# Patient Record
Sex: Male | Born: 1945 | Race: White | Hispanic: No | Marital: Married | Smoking: Never smoker
Health system: Southern US, Community
[De-identification: ages and names within clinical notes are randomized; demographics above are authoritative.]

## PROBLEM LIST (undated history)

## (undated) DIAGNOSIS — I2510 Atherosclerotic heart disease of native coronary artery without angina pectoris: Secondary | ICD-10-CM

## (undated) DIAGNOSIS — E785 Hyperlipidemia, unspecified: Secondary | ICD-10-CM

## (undated) DIAGNOSIS — I10 Essential (primary) hypertension: Secondary | ICD-10-CM

## (undated) DIAGNOSIS — I4892 Unspecified atrial flutter: Secondary | ICD-10-CM

## (undated) DIAGNOSIS — E119 Type 2 diabetes mellitus without complications: Secondary | ICD-10-CM

---

## 1995-09-25 HISTORY — PX: CORONARY ARTERY BYPASS GRAFT: SHX141

## 1998-06-29 ENCOUNTER — Encounter: Payer: Self-pay | Admitting: Orthopedic Surgery

## 1998-06-29 ENCOUNTER — Ambulatory Visit (HOSPITAL_COMMUNITY): Admission: RE | Admit: 1998-06-29 | Discharge: 1998-06-29 | Payer: Self-pay | Admitting: Orthopedic Surgery

## 1998-07-28 ENCOUNTER — Ambulatory Visit (HOSPITAL_COMMUNITY): Admission: RE | Admit: 1998-07-28 | Discharge: 1998-07-28 | Payer: Self-pay | Admitting: Neurosurgery

## 1998-07-28 ENCOUNTER — Encounter: Payer: Self-pay | Admitting: Neurosurgery

## 1998-08-09 ENCOUNTER — Encounter: Payer: Self-pay | Admitting: Neurosurgery

## 1998-08-11 ENCOUNTER — Inpatient Hospital Stay (HOSPITAL_COMMUNITY): Admission: RE | Admit: 1998-08-11 | Discharge: 1998-08-13 | Payer: Self-pay | Admitting: Neurosurgery

## 1998-08-11 ENCOUNTER — Encounter: Payer: Self-pay | Admitting: Neurosurgery

## 2000-01-30 HISTORY — PX: OTHER SURGICAL HISTORY: SHX169

## 2000-09-24 HISTORY — PX: SPINAL CORD STIMULATOR IMPLANT: SHX2422

## 2003-07-29 ENCOUNTER — Ambulatory Visit (HOSPITAL_COMMUNITY): Admission: RE | Admit: 2003-07-29 | Discharge: 2003-07-30 | Payer: Self-pay | Admitting: Cardiovascular Disease

## 2008-08-25 ENCOUNTER — Inpatient Hospital Stay (HOSPITAL_COMMUNITY): Admission: AD | Admit: 2008-08-25 | Discharge: 2008-08-27 | Payer: Self-pay | Admitting: Cardiology

## 2008-09-24 ENCOUNTER — Encounter (HOSPITAL_COMMUNITY): Admission: RE | Admit: 2008-09-24 | Discharge: 2008-12-23 | Payer: Self-pay | Admitting: Cardiovascular Disease

## 2008-12-24 ENCOUNTER — Encounter (HOSPITAL_COMMUNITY): Admission: RE | Admit: 2008-12-24 | Discharge: 2009-03-24 | Payer: Self-pay | Admitting: Cardiovascular Disease

## 2009-03-25 ENCOUNTER — Encounter (HOSPITAL_COMMUNITY): Admission: RE | Admit: 2009-03-25 | Discharge: 2009-06-23 | Payer: Self-pay | Admitting: Cardiovascular Disease

## 2009-06-24 ENCOUNTER — Encounter (HOSPITAL_COMMUNITY): Admission: RE | Admit: 2009-06-24 | Discharge: 2009-09-22 | Payer: Self-pay | Admitting: Cardiovascular Disease

## 2009-09-24 ENCOUNTER — Encounter (HOSPITAL_COMMUNITY): Admission: RE | Admit: 2009-09-24 | Discharge: 2009-12-23 | Payer: Self-pay | Admitting: Cardiovascular Disease

## 2009-12-24 ENCOUNTER — Encounter (HOSPITAL_COMMUNITY): Admission: RE | Admit: 2009-12-24 | Discharge: 2010-03-24 | Payer: Self-pay | Admitting: Cardiovascular Disease

## 2010-03-25 ENCOUNTER — Encounter (HOSPITAL_COMMUNITY): Admission: RE | Admit: 2010-03-25 | Discharge: 2010-06-23 | Payer: Self-pay | Admitting: Cardiovascular Disease

## 2010-06-24 ENCOUNTER — Encounter (HOSPITAL_COMMUNITY)
Admission: RE | Admit: 2010-06-24 | Discharge: 2010-09-22 | Payer: Self-pay | Source: Home / Self Care | Attending: Cardiovascular Disease | Admitting: Cardiovascular Disease

## 2010-09-26 ENCOUNTER — Encounter (HOSPITAL_COMMUNITY)
Admission: RE | Admit: 2010-09-26 | Discharge: 2010-10-24 | Payer: Self-pay | Source: Home / Self Care | Attending: Cardiovascular Disease | Admitting: Cardiovascular Disease

## 2010-10-25 ENCOUNTER — Encounter (HOSPITAL_COMMUNITY): Payer: Self-pay | Attending: Cardiovascular Disease

## 2010-10-25 DIAGNOSIS — E785 Hyperlipidemia, unspecified: Secondary | ICD-10-CM | POA: Insufficient documentation

## 2010-10-25 DIAGNOSIS — E119 Type 2 diabetes mellitus without complications: Secondary | ICD-10-CM | POA: Insufficient documentation

## 2010-10-25 DIAGNOSIS — Z7902 Long term (current) use of antithrombotics/antiplatelets: Secondary | ICD-10-CM | POA: Insufficient documentation

## 2010-10-25 DIAGNOSIS — I2510 Atherosclerotic heart disease of native coronary artery without angina pectoris: Secondary | ICD-10-CM | POA: Insufficient documentation

## 2010-10-25 DIAGNOSIS — I25810 Atherosclerosis of coronary artery bypass graft(s) without angina pectoris: Secondary | ICD-10-CM | POA: Insufficient documentation

## 2010-10-25 DIAGNOSIS — I200 Unstable angina: Secondary | ICD-10-CM | POA: Insufficient documentation

## 2010-10-25 DIAGNOSIS — Z9861 Coronary angioplasty status: Secondary | ICD-10-CM | POA: Insufficient documentation

## 2010-10-25 DIAGNOSIS — I2582 Chronic total occlusion of coronary artery: Secondary | ICD-10-CM | POA: Insufficient documentation

## 2010-10-25 DIAGNOSIS — Z5189 Encounter for other specified aftercare: Secondary | ICD-10-CM | POA: Insufficient documentation

## 2010-10-26 HISTORY — PX: CARDIOVASCULAR STRESS TEST: SHX262

## 2010-10-27 ENCOUNTER — Encounter (HOSPITAL_COMMUNITY): Payer: Self-pay

## 2010-10-30 ENCOUNTER — Encounter (HOSPITAL_COMMUNITY): Payer: Self-pay

## 2010-11-01 ENCOUNTER — Encounter (HOSPITAL_COMMUNITY): Payer: Self-pay

## 2010-11-03 ENCOUNTER — Encounter (HOSPITAL_COMMUNITY): Payer: Self-pay

## 2010-11-06 ENCOUNTER — Encounter (HOSPITAL_COMMUNITY): Payer: Self-pay

## 2010-11-08 ENCOUNTER — Encounter (HOSPITAL_COMMUNITY): Payer: Self-pay

## 2010-11-10 ENCOUNTER — Encounter (HOSPITAL_COMMUNITY): Payer: Self-pay

## 2010-11-13 ENCOUNTER — Encounter (HOSPITAL_COMMUNITY): Payer: Self-pay

## 2010-11-15 ENCOUNTER — Encounter (HOSPITAL_COMMUNITY): Payer: Self-pay

## 2010-11-17 ENCOUNTER — Encounter (HOSPITAL_COMMUNITY): Payer: Self-pay

## 2010-11-20 ENCOUNTER — Encounter (HOSPITAL_COMMUNITY): Payer: Self-pay

## 2010-11-22 ENCOUNTER — Encounter (HOSPITAL_COMMUNITY): Payer: Self-pay

## 2010-11-24 ENCOUNTER — Encounter (HOSPITAL_COMMUNITY): Payer: Self-pay | Attending: Cardiovascular Disease

## 2010-11-24 DIAGNOSIS — E119 Type 2 diabetes mellitus without complications: Secondary | ICD-10-CM | POA: Insufficient documentation

## 2010-11-24 DIAGNOSIS — Z9861 Coronary angioplasty status: Secondary | ICD-10-CM | POA: Insufficient documentation

## 2010-11-24 DIAGNOSIS — Z5189 Encounter for other specified aftercare: Secondary | ICD-10-CM | POA: Insufficient documentation

## 2010-11-24 DIAGNOSIS — I2582 Chronic total occlusion of coronary artery: Secondary | ICD-10-CM | POA: Insufficient documentation

## 2010-11-24 DIAGNOSIS — I200 Unstable angina: Secondary | ICD-10-CM | POA: Insufficient documentation

## 2010-11-24 DIAGNOSIS — I25810 Atherosclerosis of coronary artery bypass graft(s) without angina pectoris: Secondary | ICD-10-CM | POA: Insufficient documentation

## 2010-11-24 DIAGNOSIS — Z7902 Long term (current) use of antithrombotics/antiplatelets: Secondary | ICD-10-CM | POA: Insufficient documentation

## 2010-11-24 DIAGNOSIS — E785 Hyperlipidemia, unspecified: Secondary | ICD-10-CM | POA: Insufficient documentation

## 2010-11-24 DIAGNOSIS — I2510 Atherosclerotic heart disease of native coronary artery without angina pectoris: Secondary | ICD-10-CM | POA: Insufficient documentation

## 2010-11-27 ENCOUNTER — Encounter (HOSPITAL_COMMUNITY): Payer: Self-pay

## 2010-11-29 ENCOUNTER — Encounter (HOSPITAL_COMMUNITY): Payer: Self-pay

## 2010-12-01 ENCOUNTER — Encounter (HOSPITAL_COMMUNITY): Payer: Self-pay

## 2010-12-04 ENCOUNTER — Encounter (HOSPITAL_COMMUNITY): Payer: Self-pay

## 2010-12-06 ENCOUNTER — Encounter (HOSPITAL_COMMUNITY): Payer: Self-pay

## 2010-12-07 LAB — GLUCOSE, CAPILLARY: Glucose-Capillary: 81 mg/dL (ref 70–99)

## 2010-12-08 ENCOUNTER — Encounter (HOSPITAL_COMMUNITY): Payer: Self-pay

## 2010-12-11 ENCOUNTER — Encounter (HOSPITAL_COMMUNITY): Payer: Self-pay

## 2010-12-13 ENCOUNTER — Encounter (HOSPITAL_COMMUNITY): Payer: Self-pay

## 2010-12-15 ENCOUNTER — Encounter (HOSPITAL_COMMUNITY): Payer: Self-pay

## 2010-12-18 ENCOUNTER — Encounter (HOSPITAL_COMMUNITY): Payer: Self-pay

## 2010-12-20 ENCOUNTER — Encounter (HOSPITAL_COMMUNITY): Payer: Self-pay

## 2010-12-22 ENCOUNTER — Encounter (HOSPITAL_COMMUNITY): Payer: Self-pay

## 2010-12-25 ENCOUNTER — Encounter (HOSPITAL_COMMUNITY): Payer: Self-pay | Attending: Cardiovascular Disease

## 2010-12-25 DIAGNOSIS — E785 Hyperlipidemia, unspecified: Secondary | ICD-10-CM | POA: Insufficient documentation

## 2010-12-25 DIAGNOSIS — I25810 Atherosclerosis of coronary artery bypass graft(s) without angina pectoris: Secondary | ICD-10-CM | POA: Insufficient documentation

## 2010-12-25 DIAGNOSIS — E119 Type 2 diabetes mellitus without complications: Secondary | ICD-10-CM | POA: Insufficient documentation

## 2010-12-25 DIAGNOSIS — Z5189 Encounter for other specified aftercare: Secondary | ICD-10-CM | POA: Insufficient documentation

## 2010-12-25 DIAGNOSIS — Z9861 Coronary angioplasty status: Secondary | ICD-10-CM | POA: Insufficient documentation

## 2010-12-25 DIAGNOSIS — I2582 Chronic total occlusion of coronary artery: Secondary | ICD-10-CM | POA: Insufficient documentation

## 2010-12-25 DIAGNOSIS — I200 Unstable angina: Secondary | ICD-10-CM | POA: Insufficient documentation

## 2010-12-25 DIAGNOSIS — Z7902 Long term (current) use of antithrombotics/antiplatelets: Secondary | ICD-10-CM | POA: Insufficient documentation

## 2010-12-25 DIAGNOSIS — I2510 Atherosclerotic heart disease of native coronary artery without angina pectoris: Secondary | ICD-10-CM | POA: Insufficient documentation

## 2010-12-27 ENCOUNTER — Encounter (HOSPITAL_COMMUNITY): Payer: Self-pay

## 2010-12-29 ENCOUNTER — Encounter (HOSPITAL_COMMUNITY): Payer: Self-pay

## 2011-01-01 ENCOUNTER — Encounter (HOSPITAL_COMMUNITY): Payer: Self-pay

## 2011-01-03 ENCOUNTER — Encounter (HOSPITAL_COMMUNITY): Payer: Self-pay

## 2011-01-05 ENCOUNTER — Encounter (HOSPITAL_COMMUNITY): Payer: Self-pay

## 2011-01-08 ENCOUNTER — Encounter (HOSPITAL_COMMUNITY): Payer: Self-pay

## 2011-01-10 ENCOUNTER — Encounter (HOSPITAL_COMMUNITY): Payer: Self-pay

## 2011-01-12 ENCOUNTER — Encounter (HOSPITAL_COMMUNITY): Payer: Self-pay

## 2011-01-15 ENCOUNTER — Encounter (HOSPITAL_COMMUNITY): Payer: Self-pay

## 2011-01-17 ENCOUNTER — Encounter (HOSPITAL_COMMUNITY): Payer: Self-pay

## 2011-01-19 ENCOUNTER — Encounter (HOSPITAL_COMMUNITY): Payer: Self-pay

## 2011-01-22 ENCOUNTER — Encounter (HOSPITAL_COMMUNITY): Payer: Self-pay

## 2011-01-24 ENCOUNTER — Encounter (HOSPITAL_COMMUNITY): Payer: Self-pay | Attending: Cardiovascular Disease

## 2011-01-24 DIAGNOSIS — Z7902 Long term (current) use of antithrombotics/antiplatelets: Secondary | ICD-10-CM | POA: Insufficient documentation

## 2011-01-24 DIAGNOSIS — E785 Hyperlipidemia, unspecified: Secondary | ICD-10-CM | POA: Insufficient documentation

## 2011-01-24 DIAGNOSIS — I200 Unstable angina: Secondary | ICD-10-CM | POA: Insufficient documentation

## 2011-01-24 DIAGNOSIS — I2582 Chronic total occlusion of coronary artery: Secondary | ICD-10-CM | POA: Insufficient documentation

## 2011-01-24 DIAGNOSIS — I2510 Atherosclerotic heart disease of native coronary artery without angina pectoris: Secondary | ICD-10-CM | POA: Insufficient documentation

## 2011-01-24 DIAGNOSIS — I25810 Atherosclerosis of coronary artery bypass graft(s) without angina pectoris: Secondary | ICD-10-CM | POA: Insufficient documentation

## 2011-01-24 DIAGNOSIS — Z9861 Coronary angioplasty status: Secondary | ICD-10-CM | POA: Insufficient documentation

## 2011-01-24 DIAGNOSIS — E119 Type 2 diabetes mellitus without complications: Secondary | ICD-10-CM | POA: Insufficient documentation

## 2011-01-24 DIAGNOSIS — Z5189 Encounter for other specified aftercare: Secondary | ICD-10-CM | POA: Insufficient documentation

## 2011-01-26 ENCOUNTER — Encounter (HOSPITAL_COMMUNITY): Payer: Self-pay

## 2011-01-29 ENCOUNTER — Encounter (HOSPITAL_COMMUNITY): Payer: Self-pay

## 2011-01-31 ENCOUNTER — Encounter (HOSPITAL_COMMUNITY): Payer: Self-pay

## 2011-02-02 ENCOUNTER — Encounter (HOSPITAL_COMMUNITY): Payer: Self-pay

## 2011-02-05 ENCOUNTER — Encounter (HOSPITAL_COMMUNITY): Payer: Self-pay

## 2011-02-06 NOTE — Discharge Summary (Signed)
NAME:  Ruben [name], Ruben [name] NO.:  [id number]   MEDICAL RECORD NO.:  [id number]          PATIENT TYPE:  INP   LOCATION:  6524                         FACILITY:  [facility]   PHYSICIAN:  Nicki Guadalajara, M.D.     DATE OF BIRTH:  06/06/1946   DATE OF ADMISSION:  08/25/2008  DATE OF DISCHARGE:  08/27/2008                               DISCHARGE SUMMARY   DISCHARGE DIAGNOSES:  1. Unstable angina, status post elective catheterization and      percutaneous coronary intervention to the saphenous vein graft to      right coronary artery, and left main by Dr. Clarene Duke.  2. History of coronary artery disease, coronary artery bypass grafting      in 1997, with subsequent obtuse marginal vein graft stenting in      2004.  3. Treated dyslipidemia.  4. Non-insulin-dependent diabetes.   HOSPITAL COURSE:  The patient is a 65 year old male followed by Dr.  Tresa Endo and Dr. Casimiro Needle [name] with a history of coronary artery  disease as noted above.  He had bypass surgery in 1997, and a PCI in  2004.  He presented to our office on August 25, 2008, and was seen by  Dr. Garen Lah.  The patient was having chest pain concerning for unstable  angina.  He was admitted to telemetry and set up for catheterization.  He was started on heparin and nitrates.  Enzymes were negative.  He  underwent diagnostic catheterization by Dr. Clarene Duke on August 26, 2008.  The SVG to the OM was occluded.  The LIMA to the LAD was patent.  The  SVG to the diagonal was patent.  The SVG to the PDA and PL at an 80%  narrowing.  The patient underwent PCI and Taxus stenting to the SVG to  RCA and a PCI with a Palmaz stent to the left main.  He tolerated this  well.  Dr. Tresa Endo saw him in the morning of the August 27, 2008.  We  feel he can be discharged.   LABORATORY DATA:  Sodium 140, potassium 4.2, BUN 12, and creatinine 0.8.  White count 8.1, hemoglobin 12.9, hematocrit 37.8, and platelets 138.  CK-MB and troponins were  negative x3.  His TSH is 1.42.  Liver functions  are essentially normal, his AST is slightly elevated at 44, ALT 47.  Chest x-ray shows mild cardiomegaly, no active disease.  EKG shows sinus  rhythm and sinus bradycardia.   DISCHARGE MEDICATIONS:  1. Actos 45 mg a day.  2. Zetia 10 mg a day.  3. Altace 10 mg a day.  4. Plavix 75 mg a day.  5. Crestor 40 mg a day.  6. Niacin2 g nightly.  7. Lyrica 150 mg t.i.d.  8. Lovaza 2 g nightly.  9. Ultram 100 mg a day.  10.Atenolol has been cut back to 25 mg a day.  11.Nitroglycerin sublingual p.r.n.  12.Plavix 75 mg a day.   DISPOSITION:  The patient is discharged in stable condition and will  follow up with Dr. Tresa Endo.      Abelino Derrick, P.A.  ______________________________  Nicki Guadalajara, M.D.    Lenard Lance  D:  08/27/2008  T:  08/27/2008  Job:  [id number]

## 2011-02-06 NOTE — Cardiovascular Report (Signed)
NAME:  Ruben [name], Ruben [name] NO.:  [id number]   MEDICAL RECORD NO.:  [id number]          PATIENT TYPE:  INP   LOCATION:  6524                         FACILITY:  [facility]   PHYSICIAN:  Thereasa Solo. Little, M.D. DATE OF BIRTH:  Apr 22, 1946   DATE OF PROCEDURE:  08/26/2008  DATE OF DISCHARGE:                            CARDIAC CATHETERIZATION   This 65 year old male had bypass surgery in 1997 by Dr. Laneta Simmers.  In  2004, he had a stent placed to the saphenous vein graft to his OM  system.  He had begun having recurrent problems with anginal pain and  presented to the office on August 25, 2008, where he was admitted to  the hospital for cath today.  His cardiac markers were unremarkable.   After obtaining informed consent, the patient was prepped and draped in  the usual sterile fashion exposing the right groin.  Following local  anesthetic with 1% Xylocaine, the Seldinger technique was employed and a  5-French introducer sheath was placed in the right femoral artery.  Left  and right coronary arteriography, graft visualization, and interventions  x2 were performed.   No ventriculogram was performed in an attempt to try to conserve  contrast exposure.   Total contrast 225 mL.   EQUIPMENT:  A 5-French diagnostic catheters and LCB catheter had to be  used for evaluation of the saphenous vein graft to the RCA and to the  diagonal.   RESULTS:  Hemodynamic monitoring.  His central aortic pressure was 92/51  and he had a rhythm of sinus brady with a rate of 49.  1. Left main.  The left main had a long tapering lesion from the most      proximal portion of it extending into the ramus intermediate.  It      was about 80% narrowed with what appeared to be an ulcerated plaque      at the terminal portion of the left main.  The ramus intermediate      itself had only mild irregularity, it was actually very large      vessel that bifurcated.  2. Circumflex.  A small insignificant  circumflex vessel came off.      There were no marginals coming off with this.  It clearly will be      jailed by any type of intervention to this ulcerated plaque.  3. LAD.  100% occluded but grafted.   Right coronary artery.  Right coronary artery is diffusely diseased with  80% long area of narrowing distally that crosses the ostium of the PDA.  The PLs themselves were free of disease.  This PDA and PL system is  grafted.   GRAFTS:  1. Saphenous vein graft to the OM, this is 100% occluded at its      ostium.  2. Internal mammary artery to the LAD.  The internal mammary artery      was widely patent and insert into the mid/distal portion of the      LAD.  The LAD itself cross the apex of the heart but had mild  irregularities.  3. Saphenous vein graft to the diagonal.  This graft was widely      patent.  The diagonal was a small vessel but patent, and there was      retrograde filling from the diagonal into the LAD.  4. Saphenous vein graft sequentially to the PDA and PL.  The graft had      in its midportion alone 15-20 mm segment of narrowing with the most      significant obstruction being about 80%.  The PDA and      posterolateral vessels were okay with only mild irregularities.   Arrangements were made for intervention.  He was given Angiomax and had  ACT of over 400.   PCI to the saphenous vein graft to the RCA was undertaken using a 6-  [place] RCB catheter with side holes and a short loose wire.  The wire  was easily placed into the distal portion of the graft and even into the  PLs.  Primary stenting was accomplished with the 3.0 x 32 Taxus stent.  It was placed in such a manner that both the proximal and distal margins  were well covered.  Two inflation 10 for 40 and 12 for 37 were then  performed.  Postdilatation of this stent was accomplished with a Voyager  3.25 x 20 balloon.  A total of 3 inflations overlapping the entire area  of the stent were performed.   Atmospheres 13 for 45 seconds, 10 for 39  and 10 for 26 seconds.  The area that had been 80% narrowed  preintervention now appeared to be normal after stenting.  There was no  evidence of any dissection or thrombus formation and no evidence of any  distal embolization.   Direction was then turned towards the left main ramus intermediate  lesion.  A 6-French JL-4 guide catheter and short loose wire was used.  The wire was placed down the intermediate.  Primary stenting with a 3.5  x 18 Promus stent which was deployed initially at 14 atmospheres for 39  seconds and then 14 atmospheres for 33 seconds.  Postdilatation of this  stent was accomplished with a 3.75 x 15 balloon.  A single inflation of  16 atmospheres for 45 seconds was done.  The area that had been 80%  narrowed was now normal.  The stent extended almost to the ostium of the  left main and well into the proximal segment of the ramus intermediate.  There did not appear to be any abnormalities of the stent margins.  There was no dissection or thrombus and no perforation.   The Angiomax was discontinued.  The patient is on chronic Plavix and  should be ready for discharge to home tomorrow.           ______________________________  Thereasa Solo Little, M.D.     ABL/MEDQ  D:  08/26/2008  T:  08/27/2008  Job:  [id number]   cc:   Nicki Guadalajara, M.D.  Cath Lab  Sheliah Mends, MD

## 2011-02-06 NOTE — Discharge Summary (Signed)
NAME:  Ruben [name], Ruben [name] NO.:  [id number]   MEDICAL RECORD NO.:  [id number]          PATIENT TYPE:  INP   LOCATION:  6524                         FACILITY:  [facility]   PHYSICIAN:  Nicki Guadalajara, M.D.     DATE OF BIRTH:  1946-03-07   DATE OF ADMISSION:  08/25/2008  DATE OF DISCHARGE:                               DISCHARGE SUMMARY   ADDENDUM   We also sent Ruben [name] home on Imdur 30 mg once a day and nitroglycerin  sublingual p.r.n.      Abelino Derrick, P.A.    ______________________________  Nicki Guadalajara, M.D.    Lenard Lance  D:  08/27/2008  T:  08/27/2008  Job:  [id number]   cc:   Veverly Fells. [name], M.D.

## 2011-02-06 NOTE — H&P (Signed)
NAME:  Ruben [name], Ruben [name] NO.:  [id number]   MEDICAL RECORD NO.:  [id number]          PATIENT TYPE:  INP   LOCATION:  3707                         FACILITY:  [facility]   PHYSICIAN:  Sheliah Mends, MD      DATE OF BIRTH:  11-03-1945   DATE OF ADMISSION:  08/25/2008  DATE OF DISCHARGE:                              HISTORY & PHYSICAL   CHIEF COMPLAINT:  Chest pain.   HISTORY OF PRESENT ILLNESS:  This is a 65 year old gentleman who  presented to [facility] with complaints of  chest pain, chest pressure, left arm numbness, and dizziness.  His  symptoms started on the morning of admission.  He feels overall  fatigued.   Mr. Uphoff has a longstanding history of coronary artery disease.  He  underwent coronary artery bypass grafting at the age of 57 and received  a LIMA to LAD, saphenous vein graft to the diagonal and marginal branch  as well as sequential vein graft to the PDA and PLA on April 10, 1996.  Over the years, he focused on aggressive risk factor management of his  diabetes mellitus, hypertension, and dyslipidemia.  She was diagnosed  with ischemia in 2004 and underwent percutaneous coronary intervention  and stent placement to the saphenous vein graft to the obtuse marginal  branch.  Since then, he has not required repeat cardiac catheterization.   PAST MEDICAL HISTORY:  1. Coronary artery disease status post CABG.  2. Hypertension.  3. Dyslipidemia.  4. Type 2 diabetes mellitus.  5. Pain syndrome.   MEDICATIONS:  1. Atenolol 50 mg p.o. daily.  2. Actos 45 mg p.o. daily.  3. Zetia 10 mg p.o. daily.  4. Altace 10 mg p.o. daily.  5. Plavix 75 mg p.o. daily.  6. Crestor 40 mg p.o. daily.  7. Niaspan 200 mg p.o. at bedtime.  8. Aspirin 81 mg p.o. daily.  9. Lyrica 150 mg p.o. t.i.d.  10.Tramadol 100 mg p.o. daily.  11.Omacor 2 g p.o. daily.  12.Darvocet.  13.Oxycodone p.r.n.   ALLERGIES:  No known drug allergies.   SOCIAL  HISTORY:  The patient is married and has no history of tobacco  and alcohol abuse.   FAMILY HISTORY:  The patient's father died at age 55 and had a history  of diabetes.  The patient's mother is alive.  The patient has 2 sisters  without significant medical history.   REVIEW OF SYSTEMS:  Positive as above, otherwise 12-point review of  system is negative.   PHYSICAL EXAMINATION:  GENERAL:  The patient is alert and oriented x3.  VITAL SIGNS:  Blood pressure 160/78 and heart rate 61.  NECK:  Supple.  Normal JVP.  No carotid bruits.  CHEST:  Lungs clear to auscultation bilaterally.  No rales or wheezes.  HEART:  Regular rate and rhythm.  No rub, murmur, or gallop.  ABDOMEN:  Soft, nontender, nondistended.  Positive bowel sounds.  EXTREMITIES:  No edema.   LABORATORY DATA:  EKG shows sinus bradycardia at a rate of 50 beats per  minute and flat T waves in  V5 and V6.   IMPRESSION:  1. Chest pain concerning of progression of coronary artery disease.  2. Coronary artery disease status post coronary artery bypass grafting      and percutaneous coronary intervention.  3. Hypertension.  4. Type 2 diabetes mellitus.  5. Dyslipidemia.   PLAN:  I discussed with Mr. Meuser the risk and benefits of an invasive  versus noninvasive workup.  He decided to proceed with admission to the  hospital and repeat cardiac catheterization.  Therefore, Mr. Kauk will  be admitted to telemetry and followed with serial cardiac enzymes and  EKGs.  He will be scheduled for cardiac catheterization to evaluate for  progression of his coronary artery disease on August 27, 2008.   Mr. Nawabi understands the need of cardiac catheterization including  risks and benefits.  He agreed to proceed with the procedure.   Mr. Suder will be admitted to the hospital as a full code.      Sheliah Mends, MD  Electronically Signed     JE/MEDQ  D:  08/25/2008  T:  08/26/2008  Job:  [id number]

## 2011-02-07 ENCOUNTER — Encounter (HOSPITAL_COMMUNITY): Payer: Self-pay

## 2011-02-09 ENCOUNTER — Encounter (HOSPITAL_COMMUNITY): Payer: Self-pay

## 2011-02-09 NOTE — Discharge Summary (Signed)
NAME:  EMMITTE, SURGEON NO.:  [id number]   MEDICAL RECORD NO.:  [id number]                   PATIENT TYPE:  OIB   LOCATION:  6527                                 FACILITY:  [facility]   PHYSICIAN:  Nicki Guadalajara, M.D.                  DATE OF BIRTH:  1946-09-17   DATE OF ADMISSION:  07/29/2003  DATE OF DISCHARGE:  07/30/2003                                 DISCHARGE SUMMARY   DISCHARGE DIAGNOSES:  1. Abnormal Cardiolite study, elective saphenous vein graft to obtuse     marginal Cypher stenting this admission.  2. Coronary artery bypass grafting in 1997.  3. Non-insulin dependent diabetes.  4. Treated hyperlipidemia.   HOSPITAL COURSE:  The patient is a 65 year old male who underwent bypass  surgery at age 65.  He had an LIMA to the LAD, saphenous vein graft to the  diagonal, saphenous vein graft to the marginal, saphenous vein graft to the  PDA and PLA.  This was in 1997.  He has a history of mixed hyperlipidemia.  He had a two-year followup Persantine Cardiolite recently as an outpatient  and this showed new ischemia in the anterior apical wall as well as the  distal anterior wall.  LV function was good.  He was admitted for elective  outpatient catheterization which was done at the [facility] July 23, 2003 and revealed 95% stenosis in the saphenous vein graft to OM.  He was  admitted electively July 29, 2003 for intervention with a Cypher stent to  the saphenous vein graft to OM.  He tolerated this well.  We feel he can be  discharged July 30, 2003.  He was cathed on the left side as he had some  ecchymosis and soreness from his outpatient cath on the right femoral artery  side.   DISCHARGE MEDICATIONS:  1. Plavix 75 mg daily.  2. Coated aspirin daily.  3. Imdur 30 mg daily.  4. Altace 10 mg daily.  5. Niacin 500 mg once daily.  6. Crestor 10 mg daily.  7. Tenormin 50 mg daily.  8. Neurontin 400 mg q.i.d.  9. Zetia 10 mg daily.  10.       Nitroglycerin sublingual p.r.n.  11.      Actos 45 mg daily.   LABORATORY DATA:  White count 5.8, hemoglobin 12.9, hematocrit 38.2,  platelets 180, sodium 141, potassium 4.5, BUN 10, creatinine 1.0.  CKs were  slightly elevated at 241, but MB and troponins were negative.  INR 1.0.  EKG  showed sinus rhythm, sinus bradycardia, left axis deviation.    DISPOSITION:  The patient was discharged in stable condition and will follow  up in the office August 11, 2003 for groin check. He will see Dr. Tresa Endo in  about two months after that.      Abelino Derrick, P.A.  Nicki Guadalajara, M.D.    Lenard Lance  D:  07/30/2003  T:  07/31/2003  Job:  [id number]   cc:   Veverly Fells. [name], M.D.  [address], [address]  [place]  [place] [address]  Fax: [phone]

## 2011-02-09 NOTE — Cardiovascular Report (Signed)
NAME:  Ruben [name], Ruben [name] NO.:  [id number]   MEDICAL RECORD NO.:  [id number]                   PATIENT TYPE:  OIB   LOCATION:  6527                                 FACILITY:  [facility]   PHYSICIAN:  Nicki Guadalajara, M.D.                  DATE OF BIRTH:  1945/12/25   DATE OF PROCEDURE:  DATE OF DISCHARGE:                              CARDIAC CATHETERIZATION   INDICATIONS:  [name] is a very pleasant 66 year old white male  who underwent CBG revascularization surgery at age 37 with a LIMA to his  LAD, a vein to his diagonal, a vein to his marginal, and a sequential vein  to a PDA and posterolateral vessel of his right coronary artery done on April 10, 1996.  He also has history of mixed hyperlipidemia.  A recent two year  follow-up Cardiolite study suggested new ischemia involving the apex.  He  underwent cardiac catheterization on July 23, 2003 at the [facility].  This showed normal LV function with mild mid posterior  hypocontractility.  He had significant native CAD with total occlusion of  the LAD at its origin, diffuse 20% irregularity in a large ramus  intermediate vessel, total occlusion of the obtuse marginal 1 vessel rising  from a small circumflex system, and diffuse proximal 70%, 60-70% mid distal  as well as 90% distal RCA stenoses.  He was found to have a patent vein  graft involving the right coronary artery.  He had a patent vein graft  supplying the first diagonal vessel and there was retrograde filling of the  LAD to its point of origin.  He had a patent LIMA to the mid LAD.  His vein  graft to the circumflex vessel had irregularity diffusely in its proximal  segment and then was 90-95% focally stenosed in the proximal third to mid  portion of the graft and there was evidence for significant ectasia in the  distal aspect of the grafts prior to hooking into the marginal vessel.  The  patient was subsequently hydrated.  He  does have mild glucose intolerance.  He was started on Plavix therapy.  He is now scheduled for percutaneous  coronary intervention of this vein graft.   PROCEDURE:  After pre medication with Versed 3 mg intravenously, the patient  was prepped and draped in the usual fashion.  The patient had previously  consented to enroll in the STEEPLE trial.  He was randomized to the Lovenox  0.5 mg arm.  He also received Integrilin in a double bolus fashion.  Laboratory was drawn per STEEPLE study protocol.  Because of the takeoff of  the vein graft from the aorta a right bypass graft catheter was used.  A  Patriot wire was advanced down the graft into the OM vessel.  Primary  stenting was done with a 3.0 x 18 mm drug-eluting Cypher stent.  A 3.25 x 12  mm Quantum balloon was used for post stent dilatation up to 3.13 mm.  Scout  angiography confirmed an excellent angiographic result.  The patient  tolerated procedure well.   HEMODYNAMIC DATA:  Central aortic pressure was 128/75, mean 98.  During the  procedure the patient received 600 mcg of intracoronary nitroglycerin in  increments of 200 mcg at a time.   ANGIOGRAPHIC DATA:  The saphenous vein graft supplying the circumflex vessel  was a moderate sized vessel.  There was mild irregularity previously noted  diffusely in the proximal segment without significant obstructive disease.  In the region of a bend in the graft there was diffuse 95% stenoses.  The  distal aspect of the graft was ectatic prior to anastomosing into the OM  vessel.  The stenosis did not improve following IC nitroglycerin  administration.  Following primary stenting and ultimate post stent  dilatation, the 95% stenosis was reduced to less than 0%.  There was a mild  step-up at the stented segment.  There was TIMI 3 flow.  There was no  evidence for dissection.    IMPRESSION:  Successful percutaneous coronary intervention of the vein graft  supplying the circumflex marginal  vessel done with primary stenting  utilizing a 3.0 x 18 mm drug-eluting Cypher stent with post dilatation up to  3.13 mm.                                               Nicki Guadalajara, M.D.    TK/MEDQ  D:  07/29/2003  T:  07/29/2003  Job:  [id number]   cc:   Veverly Fells. [name], M.D.  [address]  [place]  [place] [address]  Fax: [phone]   Cath Lab   Orville Govern

## 2011-02-12 ENCOUNTER — Encounter (HOSPITAL_COMMUNITY): Payer: Self-pay

## 2011-02-14 ENCOUNTER — Encounter (HOSPITAL_COMMUNITY): Payer: Self-pay

## 2011-02-16 ENCOUNTER — Encounter (HOSPITAL_COMMUNITY): Payer: Self-pay

## 2011-02-19 ENCOUNTER — Encounter (HOSPITAL_COMMUNITY): Payer: Self-pay

## 2011-02-21 ENCOUNTER — Encounter (HOSPITAL_COMMUNITY): Payer: Self-pay

## 2011-02-23 ENCOUNTER — Encounter (HOSPITAL_COMMUNITY): Payer: Self-pay | Attending: Cardiovascular Disease

## 2011-02-23 DIAGNOSIS — E119 Type 2 diabetes mellitus without complications: Secondary | ICD-10-CM | POA: Insufficient documentation

## 2011-02-23 DIAGNOSIS — I2582 Chronic total occlusion of coronary artery: Secondary | ICD-10-CM | POA: Insufficient documentation

## 2011-02-23 DIAGNOSIS — Z9861 Coronary angioplasty status: Secondary | ICD-10-CM | POA: Insufficient documentation

## 2011-02-23 DIAGNOSIS — I200 Unstable angina: Secondary | ICD-10-CM | POA: Insufficient documentation

## 2011-02-23 DIAGNOSIS — Z5189 Encounter for other specified aftercare: Secondary | ICD-10-CM | POA: Insufficient documentation

## 2011-02-23 DIAGNOSIS — I25810 Atherosclerosis of coronary artery bypass graft(s) without angina pectoris: Secondary | ICD-10-CM | POA: Insufficient documentation

## 2011-02-23 DIAGNOSIS — I2510 Atherosclerotic heart disease of native coronary artery without angina pectoris: Secondary | ICD-10-CM | POA: Insufficient documentation

## 2011-02-23 DIAGNOSIS — Z7902 Long term (current) use of antithrombotics/antiplatelets: Secondary | ICD-10-CM | POA: Insufficient documentation

## 2011-02-23 DIAGNOSIS — E785 Hyperlipidemia, unspecified: Secondary | ICD-10-CM | POA: Insufficient documentation

## 2011-02-26 ENCOUNTER — Encounter (HOSPITAL_COMMUNITY): Payer: Self-pay

## 2011-02-28 ENCOUNTER — Encounter (HOSPITAL_COMMUNITY): Payer: Self-pay

## 2011-03-02 ENCOUNTER — Encounter (HOSPITAL_COMMUNITY): Payer: Self-pay

## 2011-03-05 ENCOUNTER — Encounter (HOSPITAL_COMMUNITY): Payer: Self-pay

## 2011-03-07 ENCOUNTER — Encounter (HOSPITAL_COMMUNITY): Payer: Self-pay

## 2011-03-09 ENCOUNTER — Encounter (HOSPITAL_COMMUNITY): Payer: Self-pay

## 2011-03-12 ENCOUNTER — Encounter (HOSPITAL_COMMUNITY): Payer: Self-pay

## 2011-03-14 ENCOUNTER — Encounter (HOSPITAL_COMMUNITY): Payer: Self-pay

## 2011-03-16 ENCOUNTER — Encounter (HOSPITAL_COMMUNITY): Payer: Self-pay

## 2011-03-19 ENCOUNTER — Encounter (HOSPITAL_COMMUNITY): Payer: Self-pay

## 2011-03-21 ENCOUNTER — Encounter (HOSPITAL_COMMUNITY): Payer: Self-pay

## 2011-03-23 ENCOUNTER — Encounter (HOSPITAL_COMMUNITY): Payer: Self-pay

## 2011-03-26 ENCOUNTER — Encounter (HOSPITAL_COMMUNITY): Payer: Self-pay | Attending: Cardiovascular Disease

## 2011-03-26 DIAGNOSIS — I25810 Atherosclerosis of coronary artery bypass graft(s) without angina pectoris: Secondary | ICD-10-CM | POA: Insufficient documentation

## 2011-03-26 DIAGNOSIS — Z5189 Encounter for other specified aftercare: Secondary | ICD-10-CM | POA: Insufficient documentation

## 2011-03-26 DIAGNOSIS — E785 Hyperlipidemia, unspecified: Secondary | ICD-10-CM | POA: Insufficient documentation

## 2011-03-26 DIAGNOSIS — Z7902 Long term (current) use of antithrombotics/antiplatelets: Secondary | ICD-10-CM | POA: Insufficient documentation

## 2011-03-26 DIAGNOSIS — I2510 Atherosclerotic heart disease of native coronary artery without angina pectoris: Secondary | ICD-10-CM | POA: Insufficient documentation

## 2011-03-26 DIAGNOSIS — I2582 Chronic total occlusion of coronary artery: Secondary | ICD-10-CM | POA: Insufficient documentation

## 2011-03-26 DIAGNOSIS — E119 Type 2 diabetes mellitus without complications: Secondary | ICD-10-CM | POA: Insufficient documentation

## 2011-03-26 DIAGNOSIS — I200 Unstable angina: Secondary | ICD-10-CM | POA: Insufficient documentation

## 2011-03-26 DIAGNOSIS — Z9861 Coronary angioplasty status: Secondary | ICD-10-CM | POA: Insufficient documentation

## 2011-03-28 ENCOUNTER — Encounter (HOSPITAL_COMMUNITY): Payer: Self-pay

## 2011-03-30 ENCOUNTER — Encounter (HOSPITAL_COMMUNITY): Payer: Self-pay

## 2011-04-02 ENCOUNTER — Encounter (HOSPITAL_COMMUNITY): Payer: Self-pay

## 2011-04-04 ENCOUNTER — Encounter (HOSPITAL_COMMUNITY): Payer: Self-pay

## 2011-04-06 ENCOUNTER — Encounter (HOSPITAL_COMMUNITY): Payer: Self-pay

## 2011-04-09 ENCOUNTER — Encounter (HOSPITAL_COMMUNITY): Payer: Self-pay

## 2011-04-11 ENCOUNTER — Encounter (HOSPITAL_COMMUNITY): Payer: Self-pay

## 2011-04-13 ENCOUNTER — Encounter (HOSPITAL_COMMUNITY): Payer: Self-pay

## 2011-04-16 ENCOUNTER — Encounter (HOSPITAL_COMMUNITY): Payer: Self-pay

## 2011-04-18 ENCOUNTER — Encounter (HOSPITAL_COMMUNITY): Payer: Self-pay

## 2011-04-20 ENCOUNTER — Encounter (HOSPITAL_COMMUNITY): Payer: Self-pay

## 2011-04-23 ENCOUNTER — Encounter (HOSPITAL_COMMUNITY): Payer: Self-pay

## 2011-04-25 ENCOUNTER — Encounter (HOSPITAL_COMMUNITY): Payer: Self-pay | Attending: Cardiovascular Disease

## 2011-04-25 DIAGNOSIS — E785 Hyperlipidemia, unspecified: Secondary | ICD-10-CM | POA: Insufficient documentation

## 2011-04-25 DIAGNOSIS — Z7902 Long term (current) use of antithrombotics/antiplatelets: Secondary | ICD-10-CM | POA: Insufficient documentation

## 2011-04-25 DIAGNOSIS — I200 Unstable angina: Secondary | ICD-10-CM | POA: Insufficient documentation

## 2011-04-25 DIAGNOSIS — I2582 Chronic total occlusion of coronary artery: Secondary | ICD-10-CM | POA: Insufficient documentation

## 2011-04-25 DIAGNOSIS — Z9861 Coronary angioplasty status: Secondary | ICD-10-CM | POA: Insufficient documentation

## 2011-04-25 DIAGNOSIS — E119 Type 2 diabetes mellitus without complications: Secondary | ICD-10-CM | POA: Insufficient documentation

## 2011-04-25 DIAGNOSIS — Z5189 Encounter for other specified aftercare: Secondary | ICD-10-CM | POA: Insufficient documentation

## 2011-04-25 DIAGNOSIS — I2510 Atherosclerotic heart disease of native coronary artery without angina pectoris: Secondary | ICD-10-CM | POA: Insufficient documentation

## 2011-04-25 DIAGNOSIS — I25810 Atherosclerosis of coronary artery bypass graft(s) without angina pectoris: Secondary | ICD-10-CM | POA: Insufficient documentation

## 2011-04-27 ENCOUNTER — Encounter (HOSPITAL_COMMUNITY): Payer: Self-pay

## 2011-04-30 ENCOUNTER — Encounter (HOSPITAL_COMMUNITY): Payer: Self-pay

## 2011-05-02 ENCOUNTER — Encounter (HOSPITAL_COMMUNITY): Payer: Self-pay

## 2011-05-04 ENCOUNTER — Encounter (HOSPITAL_COMMUNITY): Payer: Self-pay

## 2011-05-07 ENCOUNTER — Encounter (HOSPITAL_COMMUNITY): Payer: Self-pay

## 2011-05-09 ENCOUNTER — Encounter (HOSPITAL_COMMUNITY): Payer: Self-pay

## 2011-05-11 ENCOUNTER — Encounter (HOSPITAL_COMMUNITY): Payer: Self-pay

## 2011-05-14 ENCOUNTER — Encounter (HOSPITAL_COMMUNITY): Payer: Self-pay

## 2011-05-16 ENCOUNTER — Encounter (HOSPITAL_COMMUNITY): Payer: Self-pay

## 2011-05-18 ENCOUNTER — Encounter (HOSPITAL_COMMUNITY): Payer: Self-pay

## 2011-05-21 ENCOUNTER — Encounter (HOSPITAL_COMMUNITY): Payer: Self-pay

## 2011-05-23 ENCOUNTER — Encounter (HOSPITAL_COMMUNITY): Payer: Self-pay

## 2011-05-25 ENCOUNTER — Encounter (HOSPITAL_COMMUNITY): Payer: Self-pay

## 2011-05-28 ENCOUNTER — Encounter (HOSPITAL_COMMUNITY): Payer: Self-pay | Attending: Cardiovascular Disease

## 2011-05-28 DIAGNOSIS — I2510 Atherosclerotic heart disease of native coronary artery without angina pectoris: Secondary | ICD-10-CM | POA: Insufficient documentation

## 2011-05-28 DIAGNOSIS — Z9861 Coronary angioplasty status: Secondary | ICD-10-CM | POA: Insufficient documentation

## 2011-05-28 DIAGNOSIS — I200 Unstable angina: Secondary | ICD-10-CM | POA: Insufficient documentation

## 2011-05-28 DIAGNOSIS — I25810 Atherosclerosis of coronary artery bypass graft(s) without angina pectoris: Secondary | ICD-10-CM | POA: Insufficient documentation

## 2011-05-28 DIAGNOSIS — Z5189 Encounter for other specified aftercare: Secondary | ICD-10-CM | POA: Insufficient documentation

## 2011-05-28 DIAGNOSIS — Z7902 Long term (current) use of antithrombotics/antiplatelets: Secondary | ICD-10-CM | POA: Insufficient documentation

## 2011-05-28 DIAGNOSIS — E119 Type 2 diabetes mellitus without complications: Secondary | ICD-10-CM | POA: Insufficient documentation

## 2011-05-28 DIAGNOSIS — I2582 Chronic total occlusion of coronary artery: Secondary | ICD-10-CM | POA: Insufficient documentation

## 2011-05-28 DIAGNOSIS — E785 Hyperlipidemia, unspecified: Secondary | ICD-10-CM | POA: Insufficient documentation

## 2011-05-30 ENCOUNTER — Encounter (HOSPITAL_COMMUNITY): Payer: Self-pay

## 2011-06-01 ENCOUNTER — Encounter (HOSPITAL_COMMUNITY): Payer: Self-pay

## 2011-06-04 ENCOUNTER — Encounter (HOSPITAL_COMMUNITY): Payer: Self-pay

## 2011-06-05 ENCOUNTER — Ambulatory Visit
Admission: RE | Admit: 2011-06-05 | Discharge: 2011-06-05 | Disposition: A | Discharge status: Home / Self Care | Payer: No Typology Code available for payment source | Source: Ambulatory Visit | Attending: Family Medicine | Admitting: Family Medicine

## 2011-06-05 ENCOUNTER — Other Ambulatory Visit: Payer: Self-pay | Admitting: Family Medicine

## 2011-06-05 DIAGNOSIS — J209 Acute bronchitis, unspecified: Secondary | ICD-10-CM

## 2011-06-06 ENCOUNTER — Encounter (HOSPITAL_COMMUNITY): Payer: Self-pay

## 2011-06-08 ENCOUNTER — Encounter (HOSPITAL_COMMUNITY): Payer: Self-pay

## 2011-06-11 ENCOUNTER — Encounter (HOSPITAL_COMMUNITY): Payer: Self-pay

## 2011-06-13 ENCOUNTER — Encounter (HOSPITAL_COMMUNITY): Payer: Self-pay

## 2011-06-15 ENCOUNTER — Encounter (HOSPITAL_COMMUNITY): Payer: Self-pay

## 2011-06-18 ENCOUNTER — Encounter (HOSPITAL_COMMUNITY): Payer: Self-pay

## 2011-06-20 ENCOUNTER — Encounter (HOSPITAL_COMMUNITY): Payer: Self-pay

## 2011-06-22 ENCOUNTER — Encounter (HOSPITAL_COMMUNITY): Payer: Self-pay

## 2011-06-25 ENCOUNTER — Encounter (HOSPITAL_COMMUNITY): Payer: Self-pay | Attending: Cardiovascular Disease

## 2011-06-25 DIAGNOSIS — Z9861 Coronary angioplasty status: Secondary | ICD-10-CM | POA: Insufficient documentation

## 2011-06-25 DIAGNOSIS — E785 Hyperlipidemia, unspecified: Secondary | ICD-10-CM | POA: Insufficient documentation

## 2011-06-25 DIAGNOSIS — Z5189 Encounter for other specified aftercare: Secondary | ICD-10-CM | POA: Insufficient documentation

## 2011-06-25 DIAGNOSIS — I200 Unstable angina: Secondary | ICD-10-CM | POA: Insufficient documentation

## 2011-06-25 DIAGNOSIS — I2582 Chronic total occlusion of coronary artery: Secondary | ICD-10-CM | POA: Insufficient documentation

## 2011-06-25 DIAGNOSIS — Z7902 Long term (current) use of antithrombotics/antiplatelets: Secondary | ICD-10-CM | POA: Insufficient documentation

## 2011-06-25 DIAGNOSIS — E119 Type 2 diabetes mellitus without complications: Secondary | ICD-10-CM | POA: Insufficient documentation

## 2011-06-25 DIAGNOSIS — I25810 Atherosclerosis of coronary artery bypass graft(s) without angina pectoris: Secondary | ICD-10-CM | POA: Insufficient documentation

## 2011-06-25 DIAGNOSIS — I2510 Atherosclerotic heart disease of native coronary artery without angina pectoris: Secondary | ICD-10-CM | POA: Insufficient documentation

## 2011-06-27 ENCOUNTER — Encounter (HOSPITAL_COMMUNITY): Payer: Self-pay

## 2011-06-29 ENCOUNTER — Encounter (HOSPITAL_COMMUNITY): Payer: Self-pay

## 2011-06-29 LAB — CBC
HCT: 40 % (ref 39.0–52.0)
HCT: 42.8 % (ref 39.0–52.0)
MCV: 91.1 fL (ref 78.0–100.0)
Platelets: 138 10*3/uL — ABNORMAL LOW (ref 150–400)
Platelets: 154 10*3/uL (ref 150–400)
Platelets: 165 10*3/uL (ref 150–400)
RBC: 4.15 MIL/uL — ABNORMAL LOW (ref 4.22–5.81)
RDW: 15 % (ref 11.5–15.5)
WBC: 5.2 10*3/uL (ref 4.0–10.5)
WBC: 8.1 10*3/uL (ref 4.0–10.5)

## 2011-06-29 LAB — BASIC METABOLIC PANEL
BUN: 12 mg/dL (ref 6–23)
Calcium: 8.4 mg/dL (ref 8.4–10.5)
Chloride: 108 mEq/L (ref 96–112)
Creatinine, Ser: 0.78 mg/dL (ref 0.4–1.5)
GFR calc Af Amer: >60 mL/min (ref >60)
GFR calc non Af Amer: >60 mL/min (ref >60)

## 2011-06-29 LAB — COMPREHENSIVE METABOLIC PANEL
ALT: 47 U/L (ref 0–53)
AST: 44 U/L — ABNORMAL HIGH (ref 0–37)
Albumin: 3.5 g/dL (ref 3.5–5.2)
Albumin: 3.9 g/dL (ref 3.5–5.2)
Alkaline Phosphatase: 89 U/L (ref 39–117)
BUN: 17 mg/dL (ref 6–23)
Chloride: 103 mEq/L (ref 96–112)
Chloride: 104 mEq/L (ref 96–112)
Creatinine, Ser: 0.97 mg/dL (ref 0.4–1.5)
GFR calc Af Amer: >60 mL/min (ref >60)
Glucose, Bld: 108 mg/dL — ABNORMAL HIGH (ref 70–99)
Potassium: 4.1 mEq/L (ref 3.5–5.1)
Total Bilirubin: 0.5 mg/dL (ref 0.3–1.2)
Total Bilirubin: 0.9 mg/dL (ref 0.3–1.2)

## 2011-06-29 LAB — GLUCOSE, CAPILLARY
Glucose-Capillary: 104 mg/dL — ABNORMAL HIGH (ref 70–99)
Glucose-Capillary: 113 mg/dL — ABNORMAL HIGH (ref 70–99)
Glucose-Capillary: 117 mg/dL — ABNORMAL HIGH (ref 70–99)
Glucose-Capillary: 199 mg/dL — ABNORMAL HIGH (ref 70–99)

## 2011-06-29 LAB — CARDIAC PANEL(CRET KIN+CKTOT+MB+TROPI)
Relative Index: 0.7 (ref 0.0–2.5)
Relative Index: 0.8 (ref 0.0–2.5)
Relative Index: 0.9 (ref 0.0–2.5)
Total CK: 175 U/L (ref 7–232)
Total CK: 220 U/L (ref 7–232)
Troponin I: 0.02 ng/mL (ref 0.00–0.06)
Troponin I: <0.01 ng/mL (ref 0.00–0.06)
Troponin I: <0.01 ng/mL (ref 0.00–0.06)

## 2011-06-29 LAB — PROTIME-INR
INR: 1 (ref 0.00–1.49)
Prothrombin Time: 13.8 seconds (ref 11.6–15.2)

## 2011-07-02 ENCOUNTER — Encounter (HOSPITAL_COMMUNITY): Payer: Self-pay

## 2011-07-04 ENCOUNTER — Encounter (HOSPITAL_COMMUNITY): Payer: Self-pay

## 2011-07-06 ENCOUNTER — Encounter (HOSPITAL_COMMUNITY): Payer: Self-pay

## 2011-07-09 ENCOUNTER — Encounter (HOSPITAL_COMMUNITY): Payer: Self-pay

## 2011-07-11 ENCOUNTER — Encounter (HOSPITAL_COMMUNITY): Payer: Self-pay

## 2011-07-13 ENCOUNTER — Encounter (HOSPITAL_COMMUNITY): Payer: Self-pay

## 2011-07-16 ENCOUNTER — Encounter (HOSPITAL_COMMUNITY): Payer: Self-pay

## 2011-07-18 ENCOUNTER — Encounter (HOSPITAL_COMMUNITY): Payer: Self-pay

## 2011-07-20 ENCOUNTER — Encounter (HOSPITAL_COMMUNITY): Payer: Self-pay

## 2011-07-23 ENCOUNTER — Encounter (HOSPITAL_COMMUNITY): Payer: Self-pay

## 2011-07-25 ENCOUNTER — Encounter (HOSPITAL_COMMUNITY): Payer: Self-pay

## 2011-07-27 ENCOUNTER — Encounter (HOSPITAL_COMMUNITY): Payer: Self-pay

## 2011-07-27 DIAGNOSIS — I25810 Atherosclerosis of coronary artery bypass graft(s) without angina pectoris: Secondary | ICD-10-CM | POA: Insufficient documentation

## 2011-07-27 DIAGNOSIS — Z5189 Encounter for other specified aftercare: Secondary | ICD-10-CM | POA: Insufficient documentation

## 2011-07-27 DIAGNOSIS — I2582 Chronic total occlusion of coronary artery: Secondary | ICD-10-CM | POA: Insufficient documentation

## 2011-07-27 DIAGNOSIS — E119 Type 2 diabetes mellitus without complications: Secondary | ICD-10-CM | POA: Insufficient documentation

## 2011-07-27 DIAGNOSIS — Z7902 Long term (current) use of antithrombotics/antiplatelets: Secondary | ICD-10-CM | POA: Insufficient documentation

## 2011-07-27 DIAGNOSIS — I2510 Atherosclerotic heart disease of native coronary artery without angina pectoris: Secondary | ICD-10-CM | POA: Insufficient documentation

## 2011-07-27 DIAGNOSIS — Z9861 Coronary angioplasty status: Secondary | ICD-10-CM | POA: Insufficient documentation

## 2011-07-27 DIAGNOSIS — E785 Hyperlipidemia, unspecified: Secondary | ICD-10-CM | POA: Insufficient documentation

## 2011-07-27 DIAGNOSIS — I200 Unstable angina: Secondary | ICD-10-CM | POA: Insufficient documentation

## 2011-07-30 ENCOUNTER — Encounter (HOSPITAL_COMMUNITY): Payer: Self-pay

## 2011-08-01 ENCOUNTER — Encounter (HOSPITAL_COMMUNITY): Payer: Self-pay

## 2011-08-03 ENCOUNTER — Encounter (HOSPITAL_COMMUNITY): Payer: Self-pay

## 2011-08-06 ENCOUNTER — Encounter (HOSPITAL_COMMUNITY): Payer: Self-pay

## 2011-08-08 ENCOUNTER — Encounter (HOSPITAL_COMMUNITY): Payer: Self-pay

## 2011-08-10 ENCOUNTER — Encounter (HOSPITAL_COMMUNITY)
Admission: RE | Admit: 2011-08-10 | Discharge: 2011-08-10 | Disposition: A | Discharge status: Home / Self Care | Payer: Self-pay | Source: Ambulatory Visit | Attending: Cardiovascular Disease | Admitting: Cardiovascular Disease

## 2011-08-13 ENCOUNTER — Encounter (HOSPITAL_COMMUNITY)
Admission: RE | Admit: 2011-08-13 | Discharge: 2011-08-13 | Disposition: A | Discharge status: Home / Self Care | Payer: Self-pay | Source: Ambulatory Visit | Attending: Cardiovascular Disease | Admitting: Cardiovascular Disease

## 2011-08-15 ENCOUNTER — Encounter (HOSPITAL_COMMUNITY): Payer: Self-pay

## 2011-08-17 ENCOUNTER — Encounter (HOSPITAL_COMMUNITY): Payer: Self-pay

## 2011-08-20 ENCOUNTER — Encounter (HOSPITAL_COMMUNITY)
Admission: RE | Admit: 2011-08-20 | Discharge: 2011-08-20 | Disposition: A | Discharge status: Home / Self Care | Payer: Self-pay | Source: Ambulatory Visit | Attending: Cardiovascular Disease | Admitting: Cardiovascular Disease

## 2011-08-22 ENCOUNTER — Encounter (HOSPITAL_COMMUNITY): Payer: Self-pay

## 2011-08-24 ENCOUNTER — Encounter (HOSPITAL_COMMUNITY)
Admission: RE | Admit: 2011-08-24 | Discharge: 2011-08-24 | Disposition: A | Discharge status: Home / Self Care | Payer: Self-pay | Source: Ambulatory Visit | Attending: Cardiovascular Disease | Admitting: Cardiovascular Disease

## 2011-08-27 ENCOUNTER — Encounter (HOSPITAL_COMMUNITY)
Admission: RE | Admit: 2011-08-27 | Discharge: 2011-08-27 | Disposition: A | Discharge status: Home / Self Care | Payer: Self-pay | Source: Ambulatory Visit | Attending: Cardiovascular Disease | Admitting: Cardiovascular Disease

## 2011-08-27 DIAGNOSIS — I2582 Chronic total occlusion of coronary artery: Secondary | ICD-10-CM | POA: Insufficient documentation

## 2011-08-27 DIAGNOSIS — I2510 Atherosclerotic heart disease of native coronary artery without angina pectoris: Secondary | ICD-10-CM | POA: Insufficient documentation

## 2011-08-27 DIAGNOSIS — Z7902 Long term (current) use of antithrombotics/antiplatelets: Secondary | ICD-10-CM | POA: Insufficient documentation

## 2011-08-27 DIAGNOSIS — E785 Hyperlipidemia, unspecified: Secondary | ICD-10-CM | POA: Insufficient documentation

## 2011-08-27 DIAGNOSIS — Z5189 Encounter for other specified aftercare: Secondary | ICD-10-CM | POA: Insufficient documentation

## 2011-08-27 DIAGNOSIS — I25810 Atherosclerosis of coronary artery bypass graft(s) without angina pectoris: Secondary | ICD-10-CM | POA: Insufficient documentation

## 2011-08-27 DIAGNOSIS — E119 Type 2 diabetes mellitus without complications: Secondary | ICD-10-CM | POA: Insufficient documentation

## 2011-08-27 DIAGNOSIS — Z9861 Coronary angioplasty status: Secondary | ICD-10-CM | POA: Insufficient documentation

## 2011-08-27 DIAGNOSIS — I200 Unstable angina: Secondary | ICD-10-CM | POA: Insufficient documentation

## 2011-08-29 ENCOUNTER — Encounter (HOSPITAL_COMMUNITY)
Admission: RE | Admit: 2011-08-29 | Discharge: 2011-08-29 | Disposition: A | Discharge status: Home / Self Care | Payer: Self-pay | Source: Ambulatory Visit | Attending: Cardiovascular Disease | Admitting: Cardiovascular Disease

## 2011-08-31 ENCOUNTER — Encounter (HOSPITAL_COMMUNITY): Payer: Self-pay

## 2011-09-03 ENCOUNTER — Encounter (HOSPITAL_COMMUNITY)
Admission: RE | Admit: 2011-09-03 | Discharge: 2011-09-03 | Disposition: A | Discharge status: Home / Self Care | Payer: Self-pay | Source: Ambulatory Visit | Attending: Cardiovascular Disease | Admitting: Cardiovascular Disease

## 2011-09-05 ENCOUNTER — Encounter (HOSPITAL_COMMUNITY)
Admission: RE | Admit: 2011-09-05 | Discharge: 2011-09-05 | Disposition: A | Discharge status: Home / Self Care | Payer: Self-pay | Source: Ambulatory Visit | Attending: Cardiovascular Disease | Admitting: Cardiovascular Disease

## 2011-09-07 ENCOUNTER — Encounter (HOSPITAL_COMMUNITY): Payer: Self-pay

## 2011-09-10 ENCOUNTER — Encounter (HOSPITAL_COMMUNITY)
Admission: RE | Admit: 2011-09-10 | Discharge: 2011-09-10 | Disposition: A | Discharge status: Home / Self Care | Payer: Self-pay | Source: Ambulatory Visit | Attending: Cardiovascular Disease | Admitting: Cardiovascular Disease

## 2011-09-12 ENCOUNTER — Encounter (HOSPITAL_COMMUNITY): Payer: Self-pay

## 2011-09-14 ENCOUNTER — Encounter (HOSPITAL_COMMUNITY)
Admission: RE | Admit: 2011-09-14 | Discharge: 2011-09-14 | Disposition: A | Discharge status: Home / Self Care | Payer: Self-pay | Source: Ambulatory Visit | Attending: Cardiovascular Disease | Admitting: Cardiovascular Disease

## 2011-09-17 ENCOUNTER — Encounter (HOSPITAL_COMMUNITY): Payer: Self-pay

## 2011-09-19 ENCOUNTER — Encounter (HOSPITAL_COMMUNITY): Payer: Self-pay

## 2011-09-21 ENCOUNTER — Encounter (HOSPITAL_COMMUNITY): Payer: Self-pay

## 2011-09-24 ENCOUNTER — Encounter (HOSPITAL_COMMUNITY): Payer: Self-pay

## 2011-09-26 ENCOUNTER — Encounter (HOSPITAL_COMMUNITY): Payer: Self-pay

## 2011-09-28 ENCOUNTER — Encounter (HOSPITAL_COMMUNITY)
Admission: RE | Admit: 2011-09-28 | Discharge: 2011-09-28 | Disposition: A | Discharge status: Home / Self Care | Payer: Self-pay | Source: Ambulatory Visit | Attending: Cardiovascular Disease | Admitting: Cardiovascular Disease

## 2011-09-28 DIAGNOSIS — I2582 Chronic total occlusion of coronary artery: Secondary | ICD-10-CM | POA: Insufficient documentation

## 2011-09-28 DIAGNOSIS — Z9861 Coronary angioplasty status: Secondary | ICD-10-CM | POA: Insufficient documentation

## 2011-09-28 DIAGNOSIS — E785 Hyperlipidemia, unspecified: Secondary | ICD-10-CM | POA: Insufficient documentation

## 2011-09-28 DIAGNOSIS — Z5189 Encounter for other specified aftercare: Secondary | ICD-10-CM | POA: Insufficient documentation

## 2011-09-28 DIAGNOSIS — I25810 Atherosclerosis of coronary artery bypass graft(s) without angina pectoris: Secondary | ICD-10-CM | POA: Insufficient documentation

## 2011-09-28 DIAGNOSIS — Z7902 Long term (current) use of antithrombotics/antiplatelets: Secondary | ICD-10-CM | POA: Insufficient documentation

## 2011-09-28 DIAGNOSIS — E119 Type 2 diabetes mellitus without complications: Secondary | ICD-10-CM | POA: Insufficient documentation

## 2011-09-28 DIAGNOSIS — I2510 Atherosclerotic heart disease of native coronary artery without angina pectoris: Secondary | ICD-10-CM | POA: Insufficient documentation

## 2011-09-28 DIAGNOSIS — I200 Unstable angina: Secondary | ICD-10-CM | POA: Insufficient documentation

## 2011-10-01 ENCOUNTER — Encounter (HOSPITAL_COMMUNITY)
Admission: RE | Admit: 2011-10-01 | Discharge: 2011-10-01 | Disposition: A | Discharge status: Home / Self Care | Payer: Self-pay | Source: Ambulatory Visit | Attending: Cardiovascular Disease | Admitting: Cardiovascular Disease

## 2011-10-03 ENCOUNTER — Encounter (HOSPITAL_COMMUNITY): Payer: Self-pay

## 2011-10-05 ENCOUNTER — Encounter (HOSPITAL_COMMUNITY)
Admission: RE | Admit: 2011-10-05 | Discharge: 2011-10-05 | Disposition: A | Discharge status: Home / Self Care | Payer: Self-pay | Source: Ambulatory Visit | Attending: Cardiovascular Disease | Admitting: Cardiovascular Disease

## 2011-10-08 ENCOUNTER — Encounter (HOSPITAL_COMMUNITY): Payer: Self-pay

## 2011-10-10 ENCOUNTER — Encounter (HOSPITAL_COMMUNITY): Payer: Self-pay

## 2011-10-12 ENCOUNTER — Encounter (HOSPITAL_COMMUNITY): Payer: Self-pay

## 2011-10-15 ENCOUNTER — Encounter (HOSPITAL_COMMUNITY)
Admission: RE | Admit: 2011-10-15 | Discharge: 2011-10-15 | Disposition: A | Discharge status: Home / Self Care | Payer: Self-pay | Source: Ambulatory Visit | Attending: Cardiovascular Disease | Admitting: Cardiovascular Disease

## 2011-10-17 ENCOUNTER — Encounter (HOSPITAL_COMMUNITY)
Admission: RE | Admit: 2011-10-17 | Discharge: 2011-10-17 | Disposition: A | Discharge status: Home / Self Care | Payer: Self-pay | Source: Ambulatory Visit | Attending: Cardiovascular Disease | Admitting: Cardiovascular Disease

## 2011-10-19 ENCOUNTER — Encounter (HOSPITAL_COMMUNITY): Payer: Self-pay

## 2011-10-22 ENCOUNTER — Encounter (HOSPITAL_COMMUNITY)
Admission: RE | Admit: 2011-10-22 | Discharge: 2011-10-22 | Disposition: A | Discharge status: Home / Self Care | Payer: Self-pay | Source: Ambulatory Visit | Attending: Cardiovascular Disease | Admitting: Cardiovascular Disease

## 2011-10-24 ENCOUNTER — Encounter (HOSPITAL_COMMUNITY)
Admission: RE | Admit: 2011-10-24 | Discharge: 2011-10-24 | Disposition: A | Discharge status: Home / Self Care | Payer: Self-pay | Source: Ambulatory Visit | Attending: Cardiovascular Disease | Admitting: Cardiovascular Disease

## 2011-10-26 ENCOUNTER — Encounter (HOSPITAL_COMMUNITY): Payer: Self-pay

## 2011-10-29 ENCOUNTER — Encounter (HOSPITAL_COMMUNITY): Payer: Self-pay

## 2011-10-31 ENCOUNTER — Encounter (HOSPITAL_COMMUNITY): Payer: Self-pay

## 2011-11-02 ENCOUNTER — Encounter (HOSPITAL_COMMUNITY): Payer: Self-pay

## 2011-11-05 ENCOUNTER — Encounter (HOSPITAL_COMMUNITY): Payer: Self-pay

## 2011-11-07 ENCOUNTER — Encounter (HOSPITAL_COMMUNITY): Payer: Self-pay

## 2011-11-09 ENCOUNTER — Encounter (HOSPITAL_COMMUNITY): Payer: Self-pay

## 2011-11-12 ENCOUNTER — Encounter (HOSPITAL_COMMUNITY): Payer: Self-pay

## 2011-11-14 ENCOUNTER — Encounter (HOSPITAL_COMMUNITY): Payer: Self-pay

## 2011-11-16 ENCOUNTER — Encounter (HOSPITAL_COMMUNITY): Payer: Self-pay

## 2011-11-19 ENCOUNTER — Encounter (HOSPITAL_COMMUNITY): Payer: Self-pay

## 2011-11-21 ENCOUNTER — Encounter (HOSPITAL_COMMUNITY): Payer: Self-pay

## 2011-11-23 ENCOUNTER — Encounter (HOSPITAL_COMMUNITY): Payer: No Typology Code available for payment source

## 2011-11-26 ENCOUNTER — Encounter (HOSPITAL_COMMUNITY): Payer: No Typology Code available for payment source

## 2011-11-28 ENCOUNTER — Encounter (HOSPITAL_COMMUNITY): Payer: No Typology Code available for payment source

## 2011-11-30 ENCOUNTER — Encounter (HOSPITAL_COMMUNITY): Payer: No Typology Code available for payment source

## 2011-12-03 ENCOUNTER — Encounter (HOSPITAL_COMMUNITY): Payer: No Typology Code available for payment source

## 2011-12-05 ENCOUNTER — Encounter (HOSPITAL_COMMUNITY): Payer: No Typology Code available for payment source

## 2011-12-07 ENCOUNTER — Encounter (HOSPITAL_COMMUNITY): Payer: No Typology Code available for payment source

## 2011-12-10 ENCOUNTER — Encounter (HOSPITAL_COMMUNITY): Payer: No Typology Code available for payment source

## 2011-12-12 ENCOUNTER — Other Ambulatory Visit: Payer: Self-pay | Admitting: Cardiovascular Disease

## 2011-12-12 ENCOUNTER — Ambulatory Visit
Admission: RE | Admit: 2011-12-12 | Discharge: 2011-12-12 | Disposition: A | Discharge status: Home / Self Care | Payer: PRIVATE HEALTH INSURANCE | Source: Ambulatory Visit | Attending: Cardiovascular Disease | Admitting: Cardiovascular Disease

## 2011-12-12 ENCOUNTER — Encounter (HOSPITAL_COMMUNITY): Payer: No Typology Code available for payment source

## 2011-12-12 HISTORY — PX: TRANSTHORACIC ECHOCARDIOGRAM: SHX275

## 2011-12-13 ENCOUNTER — Ambulatory Visit (HOSPITAL_COMMUNITY)
Admission: RE | Admit: 2011-12-13 | Discharge: 2011-12-13 | Disposition: A | Discharge status: Home / Self Care | Payer: PRIVATE HEALTH INSURANCE | Source: Ambulatory Visit | Attending: Cardiovascular Disease | Admitting: Cardiovascular Disease

## 2011-12-13 ENCOUNTER — Encounter (HOSPITAL_COMMUNITY)
Admission: RE | Disposition: A | Discharge status: Home / Self Care | Payer: Self-pay | Source: Ambulatory Visit | Attending: Cardiovascular Disease

## 2011-12-13 ENCOUNTER — Encounter (HOSPITAL_COMMUNITY): Payer: Self-pay | Admitting: Cardiovascular Disease

## 2011-12-13 DIAGNOSIS — I2582 Chronic total occlusion of coronary artery: Secondary | ICD-10-CM | POA: Insufficient documentation

## 2011-12-13 DIAGNOSIS — Z9861 Coronary angioplasty status: Secondary | ICD-10-CM | POA: Insufficient documentation

## 2011-12-13 DIAGNOSIS — E119 Type 2 diabetes mellitus without complications: Secondary | ICD-10-CM | POA: Insufficient documentation

## 2011-12-13 DIAGNOSIS — G609 Hereditary and idiopathic neuropathy, unspecified: Secondary | ICD-10-CM | POA: Insufficient documentation

## 2011-12-13 DIAGNOSIS — R079 Chest pain, unspecified: Secondary | ICD-10-CM | POA: Insufficient documentation

## 2011-12-13 DIAGNOSIS — I25810 Atherosclerosis of coronary artery bypass graft(s) without angina pectoris: Secondary | ICD-10-CM | POA: Insufficient documentation

## 2011-12-13 DIAGNOSIS — I2510 Atherosclerotic heart disease of native coronary artery without angina pectoris: Secondary | ICD-10-CM | POA: Insufficient documentation

## 2011-12-13 DIAGNOSIS — I2584 Coronary atherosclerosis due to calcified coronary lesion: Secondary | ICD-10-CM | POA: Insufficient documentation

## 2011-12-13 HISTORY — PX: LEFT HEART CATHETERIZATION WITH CORONARY/GRAFT ANGIOGRAM: SHX5450

## 2011-12-13 HISTORY — PX: CARDIAC CATHETERIZATION: SHX172

## 2011-12-13 LAB — GLUCOSE, CAPILLARY: Glucose-Capillary: 102 mg/dL — ABNORMAL HIGH (ref 70–99)

## 2011-12-13 SURGERY — LEFT HEART CATHETERIZATION WITH CORONARY/GRAFT ANGIOGRAM
Anesthesia: LOCAL

## 2011-12-13 MED ORDER — LIDOCAINE HCL (PF) 1 % IJ SOLN
INTRAMUSCULAR | Status: AC
  Filled 2011-12-13: qty 30

## 2011-12-13 MED ORDER — SODIUM CHLORIDE 0.9 % IV SOLN: 250.0000 mL | INTRAVENOUS | Status: DC | PRN

## 2011-12-13 MED ORDER — SODIUM CHLORIDE 0.9 % IJ SOLN
3.0000 mL | Freq: Two times a day (BID) | INTRAMUSCULAR | Status: DC
  Administered 2011-12-13: 3 mL via INTRAVENOUS

## 2011-12-13 MED ORDER — HEPARIN (PORCINE) IN NACL 2-0.9 UNIT/ML-% IJ SOLN
INTRAMUSCULAR | Status: AC
  Filled 2011-12-13: qty 2000

## 2011-12-13 MED ORDER — NITROGLYCERIN 0.2 MG/ML ON CALL CATH LAB
INTRAVENOUS | Status: AC
  Filled 2011-12-13: qty 1

## 2011-12-13 MED ORDER — DIAZEPAM 5 MG PO TABS
5.0000 mg | ORAL_TABLET | ORAL | Status: AC
  Administered 2011-12-13: 5 mg via ORAL
  Filled 2011-12-13: qty 1

## 2011-12-13 MED ORDER — SODIUM CHLORIDE 0.9 % IJ SOLN: 3.0000 mL | INTRAMUSCULAR | Status: DC | PRN

## 2011-12-13 MED ORDER — SODIUM CHLORIDE 0.9 % IV SOLN
INTRAVENOUS | Status: DC
  Administered 2011-12-13: 13:00:00 via INTRAVENOUS

## 2011-12-13 MED ORDER — MIDAZOLAM HCL 2 MG/2ML IJ SOLN
INTRAMUSCULAR | Status: AC
  Filled 2011-12-13: qty 2

## 2011-12-13 MED ORDER — FENTANYL CITRATE 0.05 MG/ML IJ SOLN
INTRAMUSCULAR | Status: AC
  Filled 2011-12-13: qty 2

## 2011-12-13 NOTE — H&P (Signed)
  Updated H&P:  Full note dictated 12/12/2011.  No changes since yesterday.  No recurrent Chest pain.  No Change in PEx.  Labs reviewed. Plan for cath/possible PCI today.

## 2011-12-13 NOTE — Brief Op Note (Signed)
12/13/2011  3:10 PM  Cardiac Catherization  PATIENT:  Ruben [name]  66 y.o. male  PRE-OPERATIVE DIAGNOSIS:  Chest Pain  Full note dictated; see diagram  Costella Hatcher, 66 y.o., male  DICTATION [id number], [id number]  Tolerated well; Rec: Increased medical therapy  Lennette Bihari, MD, [facility] 12/13/2011 3:10 PM

## 2011-12-13 NOTE — Discharge Instructions (Signed)

## 2011-12-14 ENCOUNTER — Encounter (HOSPITAL_COMMUNITY): Payer: No Typology Code available for payment source

## 2011-12-14 NOTE — Cardiovascular Report (Signed)
NAME:  ROBSON, TRICKEY NO.:  [id number]  MEDICAL RECORD NO.:  [id number]  LOCATION:  [facility]                         FACILITY:  [facility]  PHYSICIAN:  Nicki Guadalajara, M.D.     DATE OF BIRTH:  [birth year]  DATE OF PROCEDURE: DATE OF DISCHARGE:  12/13/2011                           CARDIAC CATHETERIZATION   INDICATIONS:  [name] is a 66 year old gentleman who underwent CABG revascularization surgery by Dr. Laneta Simmers in 1997, at which time, he had a LIMA placed to his LAD, a vein graft to his diagonal, vein graft to his obtuse marginal, and vein graft to his right coronary artery.  In November 2004, he underwent stenting of the vein graft to the circumflex vessel.  His last catheterization was in December 2009, at which time the graft to the circumflex was occluded.  He underwent stenting of his left main into the ramus intermediate vessel, and also stenting of the vein graft to the right coronary artery.  The patient has continued to do well.  Over the last several months, he has noticed some vague symptomatology with recent development of chest pressure that was nitrate responsive.  He was seen in the office yesterday and with his complaints, 16 years status post CABG revascularization surgery, definitive diagnostic cardiac catheterization was recommended.  PROCEDURE:  After premedication with Versed 2 mg plus fentanyl 50 mcg, the patient was prepped and draped in usual fashion.  His right femoral artery was punctured anteriorly and a 5-French sheath was inserted. Diagnostic cardiac catheterization was done utilizing 5-French Judkins 4 left and right coronary catheters.  The right catheter was also used for selective angiography into the vein graft supplying the right coronary artery and selective angiography into the vein graft, which had supplied the circumflex vessel.  The left bypass graft catheter was necessary for selective angiography into the vein graft  supplying the diagonal vessel. A LIMA catheter was used for selective angiography into the left internal mammary artery.  A 5-French pigtail catheter was used for biplane cine left ventriculography as well as distal aortography. Hemostasis was obtained by direct manual pressure.  Prior to breaking scrub, angiographic findings reviewed with 2009 films.  HEMODYNAMIC DATA:  Central aortic pressure is 125/67.  Left ventricular pressure 125/12, post A-wave 22.  ANGIOGRAPHIC DATA:  Left main coronary artery, had a stent commencing proximally, which extended into a ramus intermediate vessel.  There was a minimal 10% narrowing in the left main proximal to the stented segment.  The stent was widely patent.  The LAD was not visualized from the left main injection.  The ramus intermediate vessel was a moderate caliber vessel, in which the stent arose from the left main and into the proximal intermediate vessel.  The intermediate vessel extended to the apex and gave rise to 1 major branch proximally and several small branches distally.  The native circumflex vessel was a small caliber vessel.  The circumflex marginal vessel was occluded and there was very faint visualization seen in the distal marginal vessel.  The native right coronary artery had diffuse calcification and disease. A stent was present proximal native vessel.  This was patent, but there was 40% intimal hyperplasia  noted proximally.  There was a stent in the region of the crux and there appeared also to be 40%-50% narrowing in the stented segment.  The distal RCA tapered prior to rising into the PDA and continuation branch.  There was narrowing of 50% in this tapered segment proximal to the PDA takeoff.  A flush and fill was seen in the PDA vessel due to competitive flow from the SVG graft.  The distal right coronary artery ended in the posterolateral branch.  The vein graft supplying the right coronary artery was widely  patent. The stent in the mid body of the graft was widely patent.  There was no restenosis.  The graft anastomosed into the proximal PDA and there was brisk filling of the PDA vessel and distal RCA.  The vein graft, which had supplied the circumflex vessel was totally occluded at its origin.  This was old and was totally occluded in 2009.  The vein graft, which supplies the diagonal vessel was widely patent. The distal diagonal vessel beyond the graft insertion, was widely patent without stenosis.  However, the very proximal portion of the diagonal ostially after it arose from the LAD at 80% stenosis and then there was 90% stenosis in the LAD between 2 septal branches.  The LAD was then occluded after the second septal branch.  The LIMA graft was widely patent and anastomosed into the mid LAD. Distal LAD beyond the anastomosis was free of significant disease. Antegrade to the anastomosis the LAD extended to the point of total proximal occlusion as seen from the graft arising from the diagonal vessel.  Biplane cine left ventriculography revealed preserved global contractility with an ejection fraction of 55%.  On the RAO projection, there was a small subtle area of mid anterolateral hypocontractility. In the LAO view, there was very minimal region of focal mild posterolateral hypocontractility.  Distal aortography revealed widely patent renal arteries.  There was no evidence for aortic aneurysm.  IMPRESSION: 1. Preserved global left ventricular function with very mild focal     anterolateral to mid posterolateral hypocontractility. 2. Significant native coronary artery disease with evidence for     nonvisualization of the LAD arising from the left main with a     widely patent left main stent extending into the ramus intermediate     vessel, which was free of significant disease. 3. Left circumflex coronary artery, which is small caliber, and with     total occlusion of the OM  vessel with very faint collateralization     to the OM system. 4. Calcified right coronary artery with patent proximal RCA stent with     40% in-stent narrowing proximally.  There is 40-50% narrowing in     the stent, which appears in the region of the acute margin.  There     is tapering with narrowing of 50% in the distal RCA proximal to the     PDA takeoff. 5. Widely patent SVG to RCA stent anastomosing into the PDA. 6. Total occlusion of the vein graft, which had previously supplied     the circumflex marginal vessel, and this occlusion had been     documented in 2009. 7. Patent SVG to the diagonal vessel, with brisk flow distally.  There     is evidence for ostial diagonal stenosis just after its takeoff     from the LAD of 80%, followed by 90% stenosis in the LAD between 2     septal branches and total occlusion  of the LAD after the second     septal branch visualized from this SVG to diagonal vessel. 8. Patent LIMA graft to the mid LAD. 9. Patent renal arteries without evidence for aortic aneurysm     dilatation.  RECOMMENDATIONS:  Medical therapy with increased medical trial.  In comparison to the study of 2009, the sites of intervention done at that time, remained patent.  Certainly possible the patient may have ischemia in the distal circumflex marginal territory.  In addition, the potential zone of ischemia may be in the very proximal LAD, which is proximal to the LIMA insertion.          ______________________________ Nicki Guadalajara, M.D.     TK/MEDQ  D:  12/13/2011  T:  12/14/2011  Job:  [id number]  cc:   Dr. Docia Chuck

## 2011-12-17 ENCOUNTER — Encounter (HOSPITAL_COMMUNITY)
Admission: RE | Admit: 2011-12-17 | Discharge: 2011-12-17 | Disposition: A | Discharge status: Home / Self Care | Payer: Self-pay | Source: Ambulatory Visit | Attending: Cardiovascular Disease | Admitting: Cardiovascular Disease

## 2011-12-17 ENCOUNTER — Encounter (HOSPITAL_COMMUNITY): Payer: No Typology Code available for payment source

## 2011-12-17 DIAGNOSIS — E119 Type 2 diabetes mellitus without complications: Secondary | ICD-10-CM | POA: Insufficient documentation

## 2011-12-17 DIAGNOSIS — Z7902 Long term (current) use of antithrombotics/antiplatelets: Secondary | ICD-10-CM | POA: Insufficient documentation

## 2011-12-17 DIAGNOSIS — E785 Hyperlipidemia, unspecified: Secondary | ICD-10-CM | POA: Insufficient documentation

## 2011-12-17 DIAGNOSIS — I25810 Atherosclerosis of coronary artery bypass graft(s) without angina pectoris: Secondary | ICD-10-CM | POA: Insufficient documentation

## 2011-12-17 DIAGNOSIS — Z9861 Coronary angioplasty status: Secondary | ICD-10-CM | POA: Insufficient documentation

## 2011-12-17 DIAGNOSIS — I2582 Chronic total occlusion of coronary artery: Secondary | ICD-10-CM | POA: Insufficient documentation

## 2011-12-17 DIAGNOSIS — Z5189 Encounter for other specified aftercare: Secondary | ICD-10-CM | POA: Insufficient documentation

## 2011-12-17 DIAGNOSIS — I2510 Atherosclerotic heart disease of native coronary artery without angina pectoris: Secondary | ICD-10-CM | POA: Insufficient documentation

## 2011-12-17 DIAGNOSIS — I200 Unstable angina: Secondary | ICD-10-CM | POA: Insufficient documentation

## 2011-12-19 ENCOUNTER — Encounter (HOSPITAL_COMMUNITY): Payer: Self-pay

## 2011-12-19 ENCOUNTER — Encounter (HOSPITAL_COMMUNITY): Payer: No Typology Code available for payment source

## 2011-12-21 ENCOUNTER — Encounter (HOSPITAL_COMMUNITY)
Admission: RE | Admit: 2011-12-21 | Discharge: 2011-12-21 | Disposition: A | Discharge status: Home / Self Care | Payer: Self-pay | Source: Ambulatory Visit | Attending: Cardiovascular Disease | Admitting: Cardiovascular Disease

## 2011-12-21 ENCOUNTER — Encounter (HOSPITAL_COMMUNITY): Payer: No Typology Code available for payment source

## 2011-12-24 ENCOUNTER — Encounter (HOSPITAL_COMMUNITY)
Admission: RE | Admit: 2011-12-24 | Discharge: 2011-12-24 | Disposition: A | Discharge status: Home / Self Care | Payer: Self-pay | Source: Ambulatory Visit | Attending: Cardiovascular Disease | Admitting: Cardiovascular Disease

## 2011-12-24 ENCOUNTER — Encounter (HOSPITAL_COMMUNITY): Payer: No Typology Code available for payment source

## 2011-12-24 DIAGNOSIS — E785 Hyperlipidemia, unspecified: Secondary | ICD-10-CM | POA: Insufficient documentation

## 2011-12-24 DIAGNOSIS — I2510 Atherosclerotic heart disease of native coronary artery without angina pectoris: Secondary | ICD-10-CM | POA: Insufficient documentation

## 2011-12-24 DIAGNOSIS — Z9861 Coronary angioplasty status: Secondary | ICD-10-CM | POA: Insufficient documentation

## 2011-12-24 DIAGNOSIS — Z7902 Long term (current) use of antithrombotics/antiplatelets: Secondary | ICD-10-CM | POA: Insufficient documentation

## 2011-12-24 DIAGNOSIS — I200 Unstable angina: Secondary | ICD-10-CM | POA: Insufficient documentation

## 2011-12-24 DIAGNOSIS — E119 Type 2 diabetes mellitus without complications: Secondary | ICD-10-CM | POA: Insufficient documentation

## 2011-12-24 DIAGNOSIS — Z5189 Encounter for other specified aftercare: Secondary | ICD-10-CM | POA: Insufficient documentation

## 2011-12-24 DIAGNOSIS — I2582 Chronic total occlusion of coronary artery: Secondary | ICD-10-CM | POA: Insufficient documentation

## 2011-12-24 DIAGNOSIS — I25810 Atherosclerosis of coronary artery bypass graft(s) without angina pectoris: Secondary | ICD-10-CM | POA: Insufficient documentation

## 2011-12-26 ENCOUNTER — Encounter (HOSPITAL_COMMUNITY): Payer: Self-pay

## 2011-12-26 ENCOUNTER — Encounter (HOSPITAL_COMMUNITY): Payer: No Typology Code available for payment source

## 2011-12-28 ENCOUNTER — Encounter (HOSPITAL_COMMUNITY): Payer: No Typology Code available for payment source

## 2011-12-28 ENCOUNTER — Encounter (HOSPITAL_COMMUNITY): Payer: Self-pay

## 2011-12-31 ENCOUNTER — Encounter (HOSPITAL_COMMUNITY)
Admission: RE | Admit: 2011-12-31 | Discharge: 2011-12-31 | Disposition: A | Discharge status: Home / Self Care | Payer: Self-pay | Source: Ambulatory Visit | Attending: Cardiovascular Disease | Admitting: Cardiovascular Disease

## 2011-12-31 ENCOUNTER — Encounter (HOSPITAL_COMMUNITY): Payer: No Typology Code available for payment source

## 2012-01-02 ENCOUNTER — Encounter (HOSPITAL_COMMUNITY): Payer: Self-pay

## 2012-01-02 ENCOUNTER — Encounter (HOSPITAL_COMMUNITY): Payer: No Typology Code available for payment source

## 2012-01-04 ENCOUNTER — Encounter (HOSPITAL_COMMUNITY)
Admission: RE | Admit: 2012-01-04 | Discharge: 2012-01-04 | Disposition: A | Discharge status: Home / Self Care | Payer: Self-pay | Source: Ambulatory Visit | Attending: Cardiovascular Disease | Admitting: Cardiovascular Disease

## 2012-01-04 ENCOUNTER — Encounter (HOSPITAL_COMMUNITY): Payer: No Typology Code available for payment source

## 2012-01-07 ENCOUNTER — Encounter (HOSPITAL_COMMUNITY): Payer: No Typology Code available for payment source

## 2012-01-07 ENCOUNTER — Encounter (HOSPITAL_COMMUNITY)
Admission: RE | Admit: 2012-01-07 | Discharge: 2012-01-07 | Disposition: A | Discharge status: Home / Self Care | Payer: Self-pay | Source: Ambulatory Visit | Attending: Cardiovascular Disease | Admitting: Cardiovascular Disease

## 2012-01-09 ENCOUNTER — Encounter (HOSPITAL_COMMUNITY): Payer: No Typology Code available for payment source

## 2012-01-09 ENCOUNTER — Encounter (HOSPITAL_COMMUNITY): Payer: Self-pay

## 2012-01-11 ENCOUNTER — Encounter (HOSPITAL_COMMUNITY)
Admission: RE | Admit: 2012-01-11 | Discharge: 2012-01-11 | Disposition: A | Discharge status: Home / Self Care | Payer: Self-pay | Source: Ambulatory Visit | Attending: Cardiovascular Disease | Admitting: Cardiovascular Disease

## 2012-01-11 ENCOUNTER — Encounter (HOSPITAL_COMMUNITY): Payer: No Typology Code available for payment source

## 2012-01-14 ENCOUNTER — Encounter (HOSPITAL_COMMUNITY): Payer: No Typology Code available for payment source

## 2012-01-14 ENCOUNTER — Encounter (HOSPITAL_COMMUNITY)
Admission: RE | Admit: 2012-01-14 | Discharge: 2012-01-14 | Disposition: A | Discharge status: Home / Self Care | Payer: Self-pay | Source: Ambulatory Visit | Attending: Cardiovascular Disease | Admitting: Cardiovascular Disease

## 2012-01-16 ENCOUNTER — Encounter (HOSPITAL_COMMUNITY)
Admission: RE | Admit: 2012-01-16 | Discharge: 2012-01-16 | Disposition: A | Discharge status: Home / Self Care | Payer: Self-pay | Source: Ambulatory Visit | Attending: Cardiovascular Disease | Admitting: Cardiovascular Disease

## 2012-01-16 ENCOUNTER — Encounter (HOSPITAL_COMMUNITY): Payer: No Typology Code available for payment source

## 2012-01-18 ENCOUNTER — Encounter (HOSPITAL_COMMUNITY): Payer: Self-pay

## 2012-01-18 ENCOUNTER — Encounter (HOSPITAL_COMMUNITY): Payer: No Typology Code available for payment source

## 2012-01-21 ENCOUNTER — Encounter (HOSPITAL_COMMUNITY): Payer: Self-pay

## 2012-01-21 ENCOUNTER — Encounter (HOSPITAL_COMMUNITY): Payer: No Typology Code available for payment source

## 2012-01-23 ENCOUNTER — Encounter (HOSPITAL_COMMUNITY)
Admission: RE | Admit: 2012-01-23 | Discharge: 2012-01-23 | Disposition: A | Discharge status: Home / Self Care | Payer: Self-pay | Source: Ambulatory Visit | Attending: Cardiovascular Disease | Admitting: Cardiovascular Disease

## 2012-01-23 DIAGNOSIS — I200 Unstable angina: Secondary | ICD-10-CM | POA: Insufficient documentation

## 2012-01-23 DIAGNOSIS — I25810 Atherosclerosis of coronary artery bypass graft(s) without angina pectoris: Secondary | ICD-10-CM | POA: Insufficient documentation

## 2012-01-23 DIAGNOSIS — I2582 Chronic total occlusion of coronary artery: Secondary | ICD-10-CM | POA: Insufficient documentation

## 2012-01-23 DIAGNOSIS — I2510 Atherosclerotic heart disease of native coronary artery without angina pectoris: Secondary | ICD-10-CM | POA: Insufficient documentation

## 2012-01-23 DIAGNOSIS — E785 Hyperlipidemia, unspecified: Secondary | ICD-10-CM | POA: Insufficient documentation

## 2012-01-23 DIAGNOSIS — Z5189 Encounter for other specified aftercare: Secondary | ICD-10-CM | POA: Insufficient documentation

## 2012-01-23 DIAGNOSIS — E119 Type 2 diabetes mellitus without complications: Secondary | ICD-10-CM | POA: Insufficient documentation

## 2012-01-23 DIAGNOSIS — Z9861 Coronary angioplasty status: Secondary | ICD-10-CM | POA: Insufficient documentation

## 2012-01-23 DIAGNOSIS — Z7902 Long term (current) use of antithrombotics/antiplatelets: Secondary | ICD-10-CM | POA: Insufficient documentation

## 2012-01-25 ENCOUNTER — Encounter (HOSPITAL_COMMUNITY): Payer: Self-pay

## 2012-01-28 ENCOUNTER — Encounter (HOSPITAL_COMMUNITY)
Admission: RE | Admit: 2012-01-28 | Discharge: 2012-01-28 | Disposition: A | Discharge status: Home / Self Care | Payer: Self-pay | Source: Ambulatory Visit | Attending: Cardiovascular Disease | Admitting: Cardiovascular Disease

## 2012-01-30 ENCOUNTER — Encounter (HOSPITAL_COMMUNITY)
Admission: RE | Admit: 2012-01-30 | Discharge: 2012-01-30 | Disposition: A | Discharge status: Home / Self Care | Payer: Self-pay | Source: Ambulatory Visit | Attending: Cardiovascular Disease | Admitting: Cardiovascular Disease

## 2012-02-01 ENCOUNTER — Encounter (HOSPITAL_COMMUNITY): Payer: Self-pay

## 2012-02-04 ENCOUNTER — Encounter (HOSPITAL_COMMUNITY)
Admission: RE | Admit: 2012-02-04 | Discharge: 2012-02-04 | Disposition: A | Discharge status: Home / Self Care | Payer: Self-pay | Source: Ambulatory Visit | Attending: Cardiovascular Disease | Admitting: Cardiovascular Disease

## 2012-02-06 ENCOUNTER — Encounter (HOSPITAL_COMMUNITY)
Admission: RE | Admit: 2012-02-06 | Discharge: 2012-02-06 | Disposition: A | Discharge status: Home / Self Care | Payer: Self-pay | Source: Ambulatory Visit | Attending: Cardiovascular Disease | Admitting: Cardiovascular Disease

## 2012-02-08 ENCOUNTER — Encounter (HOSPITAL_COMMUNITY)
Admission: RE | Admit: 2012-02-08 | Discharge: 2012-02-08 | Disposition: A | Discharge status: Home / Self Care | Payer: Self-pay | Source: Ambulatory Visit | Attending: Cardiovascular Disease | Admitting: Cardiovascular Disease

## 2012-02-11 ENCOUNTER — Encounter (HOSPITAL_COMMUNITY)
Admission: RE | Admit: 2012-02-11 | Discharge: 2012-02-11 | Disposition: A | Discharge status: Home / Self Care | Payer: Self-pay | Source: Ambulatory Visit | Attending: Cardiovascular Disease | Admitting: Cardiovascular Disease

## 2012-02-13 ENCOUNTER — Encounter (HOSPITAL_COMMUNITY)
Admission: RE | Admit: 2012-02-13 | Discharge: 2012-02-13 | Disposition: A | Discharge status: Home / Self Care | Payer: Self-pay | Source: Ambulatory Visit | Attending: Cardiovascular Disease | Admitting: Cardiovascular Disease

## 2012-02-15 ENCOUNTER — Encounter (HOSPITAL_COMMUNITY): Payer: Self-pay

## 2012-02-18 ENCOUNTER — Encounter (HOSPITAL_COMMUNITY): Payer: Self-pay

## 2012-02-20 ENCOUNTER — Encounter (HOSPITAL_COMMUNITY)
Admission: RE | Admit: 2012-02-20 | Discharge: 2012-02-20 | Disposition: A | Discharge status: Home / Self Care | Payer: Self-pay | Source: Ambulatory Visit | Attending: Cardiovascular Disease | Admitting: Cardiovascular Disease

## 2012-02-22 ENCOUNTER — Encounter (HOSPITAL_COMMUNITY): Payer: Self-pay

## 2012-02-25 ENCOUNTER — Encounter (HOSPITAL_COMMUNITY)
Admission: RE | Admit: 2012-02-25 | Discharge: 2012-02-25 | Disposition: A | Discharge status: Home / Self Care | Payer: Self-pay | Source: Ambulatory Visit | Attending: Cardiovascular Disease | Admitting: Cardiovascular Disease

## 2012-02-25 DIAGNOSIS — Z7902 Long term (current) use of antithrombotics/antiplatelets: Secondary | ICD-10-CM | POA: Insufficient documentation

## 2012-02-25 DIAGNOSIS — I25810 Atherosclerosis of coronary artery bypass graft(s) without angina pectoris: Secondary | ICD-10-CM | POA: Insufficient documentation

## 2012-02-25 DIAGNOSIS — I2510 Atherosclerotic heart disease of native coronary artery without angina pectoris: Secondary | ICD-10-CM | POA: Insufficient documentation

## 2012-02-25 DIAGNOSIS — Z9861 Coronary angioplasty status: Secondary | ICD-10-CM | POA: Insufficient documentation

## 2012-02-25 DIAGNOSIS — E785 Hyperlipidemia, unspecified: Secondary | ICD-10-CM | POA: Insufficient documentation

## 2012-02-25 DIAGNOSIS — Z5189 Encounter for other specified aftercare: Secondary | ICD-10-CM | POA: Insufficient documentation

## 2012-02-25 DIAGNOSIS — E119 Type 2 diabetes mellitus without complications: Secondary | ICD-10-CM | POA: Insufficient documentation

## 2012-02-25 DIAGNOSIS — I2582 Chronic total occlusion of coronary artery: Secondary | ICD-10-CM | POA: Insufficient documentation

## 2012-02-25 DIAGNOSIS — I200 Unstable angina: Secondary | ICD-10-CM | POA: Insufficient documentation

## 2012-02-27 ENCOUNTER — Encounter (HOSPITAL_COMMUNITY): Payer: Self-pay

## 2012-02-29 ENCOUNTER — Encounter (HOSPITAL_COMMUNITY): Payer: Self-pay

## 2012-03-03 ENCOUNTER — Encounter (HOSPITAL_COMMUNITY)
Admission: RE | Admit: 2012-03-03 | Discharge: 2012-03-03 | Disposition: A | Discharge status: Home / Self Care | Payer: Self-pay | Source: Ambulatory Visit | Attending: Cardiovascular Disease | Admitting: Cardiovascular Disease

## 2012-03-05 ENCOUNTER — Encounter (HOSPITAL_COMMUNITY)
Admission: RE | Admit: 2012-03-05 | Discharge: 2012-03-05 | Disposition: A | Discharge status: Home / Self Care | Payer: Self-pay | Source: Ambulatory Visit | Attending: Cardiovascular Disease | Admitting: Cardiovascular Disease

## 2012-03-07 ENCOUNTER — Encounter (HOSPITAL_COMMUNITY): Payer: Self-pay

## 2012-03-10 ENCOUNTER — Encounter (HOSPITAL_COMMUNITY)
Admission: RE | Admit: 2012-03-10 | Discharge: 2012-03-10 | Disposition: A | Discharge status: Home / Self Care | Payer: Self-pay | Source: Ambulatory Visit | Attending: Cardiovascular Disease | Admitting: Cardiovascular Disease

## 2012-03-12 ENCOUNTER — Encounter (HOSPITAL_COMMUNITY): Payer: Self-pay

## 2012-03-14 ENCOUNTER — Encounter (HOSPITAL_COMMUNITY): Payer: Self-pay

## 2012-03-17 ENCOUNTER — Encounter (HOSPITAL_COMMUNITY): Payer: Self-pay

## 2012-03-19 ENCOUNTER — Encounter (HOSPITAL_COMMUNITY): Payer: Self-pay

## 2012-03-21 ENCOUNTER — Encounter (HOSPITAL_COMMUNITY): Payer: Self-pay

## 2012-03-24 ENCOUNTER — Encounter (HOSPITAL_COMMUNITY): Payer: PRIVATE HEALTH INSURANCE

## 2012-03-26 ENCOUNTER — Encounter (HOSPITAL_COMMUNITY): Payer: PRIVATE HEALTH INSURANCE

## 2012-03-28 ENCOUNTER — Encounter (HOSPITAL_COMMUNITY): Payer: PRIVATE HEALTH INSURANCE

## 2012-03-31 ENCOUNTER — Encounter (HOSPITAL_COMMUNITY): Payer: PRIVATE HEALTH INSURANCE

## 2012-04-02 ENCOUNTER — Encounter (HOSPITAL_COMMUNITY): Payer: PRIVATE HEALTH INSURANCE

## 2012-04-04 ENCOUNTER — Encounter (HOSPITAL_COMMUNITY): Payer: PRIVATE HEALTH INSURANCE

## 2012-04-07 ENCOUNTER — Encounter (HOSPITAL_COMMUNITY): Payer: PRIVATE HEALTH INSURANCE

## 2012-04-09 ENCOUNTER — Encounter (HOSPITAL_COMMUNITY): Payer: PRIVATE HEALTH INSURANCE

## 2012-04-11 ENCOUNTER — Encounter (HOSPITAL_COMMUNITY): Payer: PRIVATE HEALTH INSURANCE

## 2012-04-14 ENCOUNTER — Encounter (HOSPITAL_COMMUNITY): Payer: PRIVATE HEALTH INSURANCE

## 2012-04-16 ENCOUNTER — Encounter (HOSPITAL_COMMUNITY): Payer: PRIVATE HEALTH INSURANCE

## 2012-04-18 ENCOUNTER — Encounter (HOSPITAL_COMMUNITY): Payer: PRIVATE HEALTH INSURANCE

## 2012-04-21 ENCOUNTER — Encounter (HOSPITAL_COMMUNITY): Payer: PRIVATE HEALTH INSURANCE

## 2012-04-23 ENCOUNTER — Encounter (HOSPITAL_COMMUNITY): Payer: PRIVATE HEALTH INSURANCE

## 2012-04-25 ENCOUNTER — Encounter (HOSPITAL_COMMUNITY): Payer: PRIVATE HEALTH INSURANCE

## 2012-04-28 ENCOUNTER — Encounter (HOSPITAL_COMMUNITY): Payer: PRIVATE HEALTH INSURANCE

## 2012-04-30 ENCOUNTER — Encounter (HOSPITAL_COMMUNITY): Payer: PRIVATE HEALTH INSURANCE

## 2012-05-02 ENCOUNTER — Encounter (HOSPITAL_COMMUNITY): Payer: PRIVATE HEALTH INSURANCE

## 2012-05-05 ENCOUNTER — Encounter (HOSPITAL_COMMUNITY): Payer: PRIVATE HEALTH INSURANCE

## 2012-05-07 ENCOUNTER — Encounter (HOSPITAL_COMMUNITY): Payer: PRIVATE HEALTH INSURANCE

## 2012-05-09 ENCOUNTER — Encounter (HOSPITAL_COMMUNITY): Payer: PRIVATE HEALTH INSURANCE

## 2012-05-12 ENCOUNTER — Encounter (HOSPITAL_COMMUNITY): Payer: PRIVATE HEALTH INSURANCE

## 2012-05-14 ENCOUNTER — Encounter (HOSPITAL_COMMUNITY): Payer: PRIVATE HEALTH INSURANCE

## 2012-05-16 ENCOUNTER — Encounter (HOSPITAL_COMMUNITY): Payer: PRIVATE HEALTH INSURANCE

## 2012-05-19 ENCOUNTER — Encounter (HOSPITAL_COMMUNITY): Payer: PRIVATE HEALTH INSURANCE

## 2012-05-21 ENCOUNTER — Encounter (HOSPITAL_COMMUNITY): Payer: PRIVATE HEALTH INSURANCE

## 2012-05-23 ENCOUNTER — Encounter (HOSPITAL_COMMUNITY): Payer: PRIVATE HEALTH INSURANCE

## 2012-05-26 ENCOUNTER — Encounter (HOSPITAL_COMMUNITY): Payer: PRIVATE HEALTH INSURANCE

## 2012-05-28 ENCOUNTER — Encounter (HOSPITAL_COMMUNITY): Payer: PRIVATE HEALTH INSURANCE

## 2012-05-30 ENCOUNTER — Encounter (HOSPITAL_COMMUNITY): Payer: PRIVATE HEALTH INSURANCE

## 2012-06-02 ENCOUNTER — Encounter (HOSPITAL_COMMUNITY): Payer: PRIVATE HEALTH INSURANCE

## 2012-06-03 DIAGNOSIS — M999 Biomechanical lesion, unspecified: Secondary | ICD-10-CM | POA: Diagnosis not present

## 2012-06-03 DIAGNOSIS — M9981 Other biomechanical lesions of cervical region: Secondary | ICD-10-CM | POA: Diagnosis not present

## 2012-06-04 ENCOUNTER — Encounter (HOSPITAL_COMMUNITY): Payer: PRIVATE HEALTH INSURANCE

## 2012-06-04 DIAGNOSIS — M999 Biomechanical lesion, unspecified: Secondary | ICD-10-CM | POA: Diagnosis not present

## 2012-06-04 DIAGNOSIS — M9981 Other biomechanical lesions of cervical region: Secondary | ICD-10-CM | POA: Diagnosis not present

## 2012-06-06 ENCOUNTER — Encounter (HOSPITAL_COMMUNITY): Payer: PRIVATE HEALTH INSURANCE

## 2012-06-06 DIAGNOSIS — M9981 Other biomechanical lesions of cervical region: Secondary | ICD-10-CM | POA: Diagnosis not present

## 2012-06-06 DIAGNOSIS — M999 Biomechanical lesion, unspecified: Secondary | ICD-10-CM | POA: Diagnosis not present

## 2012-06-09 ENCOUNTER — Encounter (HOSPITAL_COMMUNITY): Payer: PRIVATE HEALTH INSURANCE

## 2012-06-09 DIAGNOSIS — M9981 Other biomechanical lesions of cervical region: Secondary | ICD-10-CM | POA: Diagnosis not present

## 2012-06-09 DIAGNOSIS — M999 Biomechanical lesion, unspecified: Secondary | ICD-10-CM | POA: Diagnosis not present

## 2012-06-10 DIAGNOSIS — M9981 Other biomechanical lesions of cervical region: Secondary | ICD-10-CM | POA: Diagnosis not present

## 2012-06-10 DIAGNOSIS — M999 Biomechanical lesion, unspecified: Secondary | ICD-10-CM | POA: Diagnosis not present

## 2012-06-11 ENCOUNTER — Encounter (HOSPITAL_COMMUNITY): Payer: PRIVATE HEALTH INSURANCE

## 2012-06-11 DIAGNOSIS — M999 Biomechanical lesion, unspecified: Secondary | ICD-10-CM | POA: Diagnosis not present

## 2012-06-11 DIAGNOSIS — M9981 Other biomechanical lesions of cervical region: Secondary | ICD-10-CM | POA: Diagnosis not present

## 2012-06-13 ENCOUNTER — Encounter (HOSPITAL_COMMUNITY): Payer: PRIVATE HEALTH INSURANCE

## 2012-06-13 DIAGNOSIS — M9981 Other biomechanical lesions of cervical region: Secondary | ICD-10-CM | POA: Diagnosis not present

## 2012-06-13 DIAGNOSIS — M999 Biomechanical lesion, unspecified: Secondary | ICD-10-CM | POA: Diagnosis not present

## 2012-06-16 ENCOUNTER — Encounter (HOSPITAL_COMMUNITY): Payer: PRIVATE HEALTH INSURANCE

## 2012-06-18 ENCOUNTER — Encounter (HOSPITAL_COMMUNITY): Payer: PRIVATE HEALTH INSURANCE

## 2012-06-20 ENCOUNTER — Encounter (HOSPITAL_COMMUNITY): Payer: PRIVATE HEALTH INSURANCE

## 2012-06-23 ENCOUNTER — Encounter (HOSPITAL_COMMUNITY): Payer: PRIVATE HEALTH INSURANCE

## 2012-06-25 ENCOUNTER — Encounter (HOSPITAL_COMMUNITY): Payer: PRIVATE HEALTH INSURANCE

## 2012-06-27 ENCOUNTER — Encounter (HOSPITAL_COMMUNITY): Payer: PRIVATE HEALTH INSURANCE

## 2012-06-27 DIAGNOSIS — M9981 Other biomechanical lesions of cervical region: Secondary | ICD-10-CM | POA: Diagnosis not present

## 2012-06-27 DIAGNOSIS — E559 Vitamin D deficiency, unspecified: Secondary | ICD-10-CM | POA: Diagnosis not present

## 2012-06-27 DIAGNOSIS — M999 Biomechanical lesion, unspecified: Secondary | ICD-10-CM | POA: Diagnosis not present

## 2012-06-27 DIAGNOSIS — E119 Type 2 diabetes mellitus without complications: Secondary | ICD-10-CM | POA: Diagnosis not present

## 2012-06-30 ENCOUNTER — Encounter (HOSPITAL_COMMUNITY): Payer: PRIVATE HEALTH INSURANCE

## 2012-06-30 DIAGNOSIS — Z23 Encounter for immunization: Secondary | ICD-10-CM | POA: Diagnosis not present

## 2012-06-30 DIAGNOSIS — G609 Hereditary and idiopathic neuropathy, unspecified: Secondary | ICD-10-CM | POA: Diagnosis not present

## 2012-06-30 DIAGNOSIS — I10 Essential (primary) hypertension: Secondary | ICD-10-CM | POA: Diagnosis not present

## 2012-06-30 DIAGNOSIS — E559 Vitamin D deficiency, unspecified: Secondary | ICD-10-CM | POA: Diagnosis not present

## 2012-06-30 DIAGNOSIS — E782 Mixed hyperlipidemia: Secondary | ICD-10-CM | POA: Diagnosis not present

## 2012-06-30 DIAGNOSIS — E119 Type 2 diabetes mellitus without complications: Secondary | ICD-10-CM | POA: Diagnosis not present

## 2012-07-01 DIAGNOSIS — G609 Hereditary and idiopathic neuropathy, unspecified: Secondary | ICD-10-CM | POA: Diagnosis not present

## 2012-07-02 ENCOUNTER — Encounter (HOSPITAL_COMMUNITY): Payer: PRIVATE HEALTH INSURANCE

## 2012-07-04 ENCOUNTER — Encounter (HOSPITAL_COMMUNITY): Payer: PRIVATE HEALTH INSURANCE

## 2012-07-07 ENCOUNTER — Encounter (HOSPITAL_COMMUNITY): Payer: PRIVATE HEALTH INSURANCE

## 2012-07-09 ENCOUNTER — Encounter (HOSPITAL_COMMUNITY): Payer: PRIVATE HEALTH INSURANCE

## 2012-07-11 ENCOUNTER — Encounter (HOSPITAL_COMMUNITY): Payer: PRIVATE HEALTH INSURANCE

## 2012-07-14 ENCOUNTER — Encounter (HOSPITAL_COMMUNITY): Payer: PRIVATE HEALTH INSURANCE

## 2012-07-16 ENCOUNTER — Encounter (HOSPITAL_COMMUNITY): Payer: PRIVATE HEALTH INSURANCE

## 2012-07-18 ENCOUNTER — Encounter (HOSPITAL_COMMUNITY): Payer: PRIVATE HEALTH INSURANCE

## 2012-07-21 ENCOUNTER — Encounter (HOSPITAL_COMMUNITY): Payer: PRIVATE HEALTH INSURANCE

## 2012-07-21 DIAGNOSIS — M9981 Other biomechanical lesions of cervical region: Secondary | ICD-10-CM | POA: Diagnosis not present

## 2012-07-21 DIAGNOSIS — M999 Biomechanical lesion, unspecified: Secondary | ICD-10-CM | POA: Diagnosis not present

## 2012-07-23 ENCOUNTER — Encounter (HOSPITAL_COMMUNITY): Payer: PRIVATE HEALTH INSURANCE

## 2012-07-25 ENCOUNTER — Encounter (HOSPITAL_COMMUNITY): Payer: PRIVATE HEALTH INSURANCE

## 2012-07-28 ENCOUNTER — Encounter (HOSPITAL_COMMUNITY): Payer: PRIVATE HEALTH INSURANCE

## 2012-07-30 ENCOUNTER — Encounter (HOSPITAL_COMMUNITY): Payer: PRIVATE HEALTH INSURANCE

## 2012-08-01 ENCOUNTER — Encounter (HOSPITAL_COMMUNITY): Payer: PRIVATE HEALTH INSURANCE

## 2012-08-01 DIAGNOSIS — E119 Type 2 diabetes mellitus without complications: Secondary | ICD-10-CM | POA: Diagnosis not present

## 2012-08-01 DIAGNOSIS — I2510 Atherosclerotic heart disease of native coronary artery without angina pectoris: Secondary | ICD-10-CM | POA: Diagnosis not present

## 2012-08-01 DIAGNOSIS — E785 Hyperlipidemia, unspecified: Secondary | ICD-10-CM | POA: Diagnosis not present

## 2012-08-04 ENCOUNTER — Encounter (HOSPITAL_COMMUNITY): Payer: PRIVATE HEALTH INSURANCE

## 2012-08-06 ENCOUNTER — Encounter (HOSPITAL_COMMUNITY): Payer: PRIVATE HEALTH INSURANCE

## 2012-08-08 ENCOUNTER — Encounter (HOSPITAL_COMMUNITY): Payer: PRIVATE HEALTH INSURANCE

## 2012-08-11 ENCOUNTER — Encounter (HOSPITAL_COMMUNITY): Payer: PRIVATE HEALTH INSURANCE

## 2012-08-13 ENCOUNTER — Encounter (HOSPITAL_COMMUNITY): Payer: PRIVATE HEALTH INSURANCE

## 2012-08-15 ENCOUNTER — Encounter (HOSPITAL_COMMUNITY): Payer: PRIVATE HEALTH INSURANCE

## 2012-08-18 ENCOUNTER — Encounter (HOSPITAL_COMMUNITY): Payer: PRIVATE HEALTH INSURANCE

## 2012-08-18 DIAGNOSIS — M999 Biomechanical lesion, unspecified: Secondary | ICD-10-CM | POA: Diagnosis not present

## 2012-08-18 DIAGNOSIS — M5137 Other intervertebral disc degeneration, lumbosacral region: Secondary | ICD-10-CM | POA: Diagnosis not present

## 2012-08-18 DIAGNOSIS — M9981 Other biomechanical lesions of cervical region: Secondary | ICD-10-CM | POA: Diagnosis not present

## 2012-08-20 ENCOUNTER — Encounter (HOSPITAL_COMMUNITY): Payer: PRIVATE HEALTH INSURANCE

## 2012-08-22 ENCOUNTER — Encounter (HOSPITAL_COMMUNITY): Payer: PRIVATE HEALTH INSURANCE

## 2012-08-25 ENCOUNTER — Encounter (HOSPITAL_COMMUNITY): Payer: PRIVATE HEALTH INSURANCE

## 2012-08-27 ENCOUNTER — Encounter (HOSPITAL_COMMUNITY): Payer: PRIVATE HEALTH INSURANCE

## 2012-08-29 ENCOUNTER — Encounter (HOSPITAL_COMMUNITY): Payer: PRIVATE HEALTH INSURANCE

## 2012-09-01 ENCOUNTER — Encounter (HOSPITAL_COMMUNITY): Payer: PRIVATE HEALTH INSURANCE

## 2012-09-03 ENCOUNTER — Encounter (HOSPITAL_COMMUNITY): Payer: PRIVATE HEALTH INSURANCE

## 2012-09-05 ENCOUNTER — Encounter (HOSPITAL_COMMUNITY): Payer: PRIVATE HEALTH INSURANCE

## 2012-09-08 ENCOUNTER — Encounter (HOSPITAL_COMMUNITY): Payer: PRIVATE HEALTH INSURANCE

## 2012-09-10 ENCOUNTER — Encounter (HOSPITAL_COMMUNITY): Payer: PRIVATE HEALTH INSURANCE

## 2012-09-10 DIAGNOSIS — M999 Biomechanical lesion, unspecified: Secondary | ICD-10-CM | POA: Diagnosis not present

## 2012-09-10 DIAGNOSIS — M9981 Other biomechanical lesions of cervical region: Secondary | ICD-10-CM | POA: Diagnosis not present

## 2012-09-10 DIAGNOSIS — M5137 Other intervertebral disc degeneration, lumbosacral region: Secondary | ICD-10-CM | POA: Diagnosis not present

## 2012-09-12 ENCOUNTER — Encounter (HOSPITAL_COMMUNITY): Payer: PRIVATE HEALTH INSURANCE

## 2012-09-15 ENCOUNTER — Encounter (HOSPITAL_COMMUNITY): Payer: PRIVATE HEALTH INSURANCE

## 2012-09-17 ENCOUNTER — Encounter (HOSPITAL_COMMUNITY): Payer: PRIVATE HEALTH INSURANCE

## 2012-09-19 ENCOUNTER — Encounter (HOSPITAL_COMMUNITY): Payer: PRIVATE HEALTH INSURANCE

## 2012-09-22 ENCOUNTER — Encounter (HOSPITAL_COMMUNITY): Payer: PRIVATE HEALTH INSURANCE

## 2012-09-23 DIAGNOSIS — M5137 Other intervertebral disc degeneration, lumbosacral region: Secondary | ICD-10-CM | POA: Diagnosis not present

## 2012-09-23 DIAGNOSIS — M9981 Other biomechanical lesions of cervical region: Secondary | ICD-10-CM | POA: Diagnosis not present

## 2012-09-23 DIAGNOSIS — M999 Biomechanical lesion, unspecified: Secondary | ICD-10-CM | POA: Diagnosis not present

## 2012-09-24 ENCOUNTER — Encounter (HOSPITAL_COMMUNITY): Payer: PRIVATE HEALTH INSURANCE

## 2012-09-26 ENCOUNTER — Encounter (HOSPITAL_COMMUNITY): Payer: PRIVATE HEALTH INSURANCE

## 2012-09-29 ENCOUNTER — Encounter (HOSPITAL_COMMUNITY): Payer: PRIVATE HEALTH INSURANCE

## 2012-09-30 DIAGNOSIS — G609 Hereditary and idiopathic neuropathy, unspecified: Secondary | ICD-10-CM | POA: Diagnosis not present

## 2012-09-30 DIAGNOSIS — G894 Chronic pain syndrome: Secondary | ICD-10-CM | POA: Diagnosis not present

## 2012-10-01 ENCOUNTER — Encounter (HOSPITAL_COMMUNITY): Payer: PRIVATE HEALTH INSURANCE

## 2012-10-03 ENCOUNTER — Encounter (HOSPITAL_COMMUNITY): Payer: PRIVATE HEALTH INSURANCE

## 2012-10-06 ENCOUNTER — Encounter (HOSPITAL_COMMUNITY): Payer: PRIVATE HEALTH INSURANCE

## 2012-10-07 DIAGNOSIS — Q6689 Other  specified congenital deformities of feet: Secondary | ICD-10-CM | POA: Diagnosis not present

## 2012-10-07 DIAGNOSIS — M2060 Acquired deformities of toe(s), unspecified, unspecified foot: Secondary | ICD-10-CM | POA: Diagnosis not present

## 2012-10-07 DIAGNOSIS — G609 Hereditary and idiopathic neuropathy, unspecified: Secondary | ICD-10-CM | POA: Diagnosis not present

## 2012-10-08 ENCOUNTER — Encounter (HOSPITAL_COMMUNITY): Payer: PRIVATE HEALTH INSURANCE

## 2012-10-10 ENCOUNTER — Encounter (HOSPITAL_COMMUNITY): Payer: PRIVATE HEALTH INSURANCE

## 2012-10-13 ENCOUNTER — Encounter (HOSPITAL_COMMUNITY): Payer: PRIVATE HEALTH INSURANCE

## 2012-10-14 DIAGNOSIS — M5137 Other intervertebral disc degeneration, lumbosacral region: Secondary | ICD-10-CM | POA: Diagnosis not present

## 2012-10-14 DIAGNOSIS — M9981 Other biomechanical lesions of cervical region: Secondary | ICD-10-CM | POA: Diagnosis not present

## 2012-10-14 DIAGNOSIS — M999 Biomechanical lesion, unspecified: Secondary | ICD-10-CM | POA: Diagnosis not present

## 2012-10-15 ENCOUNTER — Encounter (HOSPITAL_COMMUNITY): Payer: PRIVATE HEALTH INSURANCE

## 2012-10-17 ENCOUNTER — Encounter (HOSPITAL_COMMUNITY): Payer: PRIVATE HEALTH INSURANCE

## 2012-10-20 ENCOUNTER — Encounter (HOSPITAL_COMMUNITY): Payer: PRIVATE HEALTH INSURANCE

## 2012-10-22 ENCOUNTER — Encounter (HOSPITAL_COMMUNITY): Payer: PRIVATE HEALTH INSURANCE

## 2012-10-24 ENCOUNTER — Encounter (HOSPITAL_COMMUNITY): Payer: PRIVATE HEALTH INSURANCE

## 2012-10-27 ENCOUNTER — Encounter (HOSPITAL_COMMUNITY): Payer: PRIVATE HEALTH INSURANCE

## 2012-10-28 DIAGNOSIS — M999 Biomechanical lesion, unspecified: Secondary | ICD-10-CM | POA: Diagnosis not present

## 2012-10-28 DIAGNOSIS — M5030 Other cervical disc degeneration, unspecified cervical region: Secondary | ICD-10-CM | POA: Diagnosis not present

## 2012-10-28 DIAGNOSIS — M9981 Other biomechanical lesions of cervical region: Secondary | ICD-10-CM | POA: Diagnosis not present

## 2012-10-29 ENCOUNTER — Encounter (HOSPITAL_COMMUNITY): Payer: PRIVATE HEALTH INSURANCE

## 2012-10-31 ENCOUNTER — Encounter (HOSPITAL_COMMUNITY): Payer: PRIVATE HEALTH INSURANCE

## 2012-11-03 ENCOUNTER — Encounter (HOSPITAL_COMMUNITY): Payer: PRIVATE HEALTH INSURANCE

## 2012-11-04 DIAGNOSIS — M5030 Other cervical disc degeneration, unspecified cervical region: Secondary | ICD-10-CM | POA: Diagnosis not present

## 2012-11-04 DIAGNOSIS — M999 Biomechanical lesion, unspecified: Secondary | ICD-10-CM | POA: Diagnosis not present

## 2012-11-04 DIAGNOSIS — M9981 Other biomechanical lesions of cervical region: Secondary | ICD-10-CM | POA: Diagnosis not present

## 2012-11-05 ENCOUNTER — Encounter (HOSPITAL_COMMUNITY): Payer: PRIVATE HEALTH INSURANCE

## 2012-11-07 ENCOUNTER — Encounter (HOSPITAL_COMMUNITY): Payer: PRIVATE HEALTH INSURANCE

## 2012-11-10 ENCOUNTER — Encounter (HOSPITAL_COMMUNITY): Payer: PRIVATE HEALTH INSURANCE

## 2012-11-12 ENCOUNTER — Encounter (HOSPITAL_COMMUNITY): Payer: PRIVATE HEALTH INSURANCE

## 2012-11-14 ENCOUNTER — Encounter (HOSPITAL_COMMUNITY): Payer: PRIVATE HEALTH INSURANCE

## 2012-11-14 DIAGNOSIS — M9981 Other biomechanical lesions of cervical region: Secondary | ICD-10-CM | POA: Diagnosis not present

## 2012-11-17 ENCOUNTER — Encounter (HOSPITAL_COMMUNITY): Payer: PRIVATE HEALTH INSURANCE

## 2012-11-19 ENCOUNTER — Encounter (HOSPITAL_COMMUNITY): Payer: PRIVATE HEALTH INSURANCE

## 2012-11-21 ENCOUNTER — Encounter (HOSPITAL_COMMUNITY): Payer: PRIVATE HEALTH INSURANCE

## 2012-11-24 ENCOUNTER — Encounter (HOSPITAL_COMMUNITY): Payer: PRIVATE HEALTH INSURANCE

## 2012-11-26 ENCOUNTER — Encounter (HOSPITAL_COMMUNITY): Payer: PRIVATE HEALTH INSURANCE

## 2012-11-28 ENCOUNTER — Encounter (HOSPITAL_COMMUNITY): Payer: PRIVATE HEALTH INSURANCE

## 2012-12-01 ENCOUNTER — Encounter (HOSPITAL_COMMUNITY): Payer: PRIVATE HEALTH INSURANCE

## 2012-12-03 ENCOUNTER — Encounter (HOSPITAL_COMMUNITY): Payer: PRIVATE HEALTH INSURANCE

## 2012-12-05 ENCOUNTER — Encounter (HOSPITAL_COMMUNITY): Payer: PRIVATE HEALTH INSURANCE

## 2012-12-08 ENCOUNTER — Encounter (HOSPITAL_COMMUNITY): Payer: PRIVATE HEALTH INSURANCE

## 2012-12-10 ENCOUNTER — Encounter (HOSPITAL_COMMUNITY): Payer: PRIVATE HEALTH INSURANCE

## 2012-12-12 ENCOUNTER — Encounter (HOSPITAL_COMMUNITY): Payer: PRIVATE HEALTH INSURANCE

## 2012-12-15 ENCOUNTER — Encounter (HOSPITAL_COMMUNITY): Payer: PRIVATE HEALTH INSURANCE

## 2012-12-17 ENCOUNTER — Encounter (HOSPITAL_COMMUNITY): Payer: PRIVATE HEALTH INSURANCE

## 2012-12-19 ENCOUNTER — Encounter (HOSPITAL_COMMUNITY): Payer: PRIVATE HEALTH INSURANCE

## 2012-12-19 DIAGNOSIS — M5137 Other intervertebral disc degeneration, lumbosacral region: Secondary | ICD-10-CM | POA: Diagnosis not present

## 2012-12-19 DIAGNOSIS — M999 Biomechanical lesion, unspecified: Secondary | ICD-10-CM | POA: Diagnosis not present

## 2012-12-22 ENCOUNTER — Encounter (HOSPITAL_COMMUNITY): Payer: PRIVATE HEALTH INSURANCE

## 2012-12-26 DIAGNOSIS — E559 Vitamin D deficiency, unspecified: Secondary | ICD-10-CM | POA: Diagnosis not present

## 2012-12-26 DIAGNOSIS — E119 Type 2 diabetes mellitus without complications: Secondary | ICD-10-CM | POA: Diagnosis not present

## 2012-12-29 DIAGNOSIS — M5137 Other intervertebral disc degeneration, lumbosacral region: Secondary | ICD-10-CM | POA: Diagnosis not present

## 2012-12-29 DIAGNOSIS — M999 Biomechanical lesion, unspecified: Secondary | ICD-10-CM | POA: Diagnosis not present

## 2012-12-30 DIAGNOSIS — E782 Mixed hyperlipidemia: Secondary | ICD-10-CM | POA: Diagnosis not present

## 2012-12-30 DIAGNOSIS — G894 Chronic pain syndrome: Secondary | ICD-10-CM | POA: Diagnosis not present

## 2012-12-30 DIAGNOSIS — E119 Type 2 diabetes mellitus without complications: Secondary | ICD-10-CM | POA: Diagnosis not present

## 2012-12-30 DIAGNOSIS — G609 Hereditary and idiopathic neuropathy, unspecified: Secondary | ICD-10-CM | POA: Diagnosis not present

## 2012-12-30 DIAGNOSIS — I10 Essential (primary) hypertension: Secondary | ICD-10-CM | POA: Diagnosis not present

## 2012-12-30 DIAGNOSIS — E559 Vitamin D deficiency, unspecified: Secondary | ICD-10-CM | POA: Diagnosis not present

## 2013-01-06 ENCOUNTER — Other Ambulatory Visit: Payer: Self-pay | Admitting: Dermatology

## 2013-01-06 DIAGNOSIS — D485 Neoplasm of uncertain behavior of skin: Secondary | ICD-10-CM | POA: Diagnosis not present

## 2013-01-13 DIAGNOSIS — E785 Hyperlipidemia, unspecified: Secondary | ICD-10-CM | POA: Diagnosis not present

## 2013-01-13 DIAGNOSIS — Z0000 Encounter for general adult medical examination without abnormal findings: Secondary | ICD-10-CM | POA: Diagnosis not present

## 2013-01-13 DIAGNOSIS — I2510 Atherosclerotic heart disease of native coronary artery without angina pectoris: Secondary | ICD-10-CM | POA: Diagnosis not present

## 2013-01-13 DIAGNOSIS — E782 Mixed hyperlipidemia: Secondary | ICD-10-CM | POA: Diagnosis not present

## 2013-01-13 DIAGNOSIS — E559 Vitamin D deficiency, unspecified: Secondary | ICD-10-CM | POA: Diagnosis not present

## 2013-01-13 DIAGNOSIS — I10 Essential (primary) hypertension: Secondary | ICD-10-CM | POA: Diagnosis not present

## 2013-01-13 DIAGNOSIS — Z125 Encounter for screening for malignant neoplasm of prostate: Secondary | ICD-10-CM | POA: Diagnosis not present

## 2013-01-13 DIAGNOSIS — E119 Type 2 diabetes mellitus without complications: Secondary | ICD-10-CM | POA: Diagnosis not present

## 2013-01-14 DIAGNOSIS — G609 Hereditary and idiopathic neuropathy, unspecified: Secondary | ICD-10-CM | POA: Diagnosis not present

## 2013-01-20 DIAGNOSIS — M999 Biomechanical lesion, unspecified: Secondary | ICD-10-CM | POA: Diagnosis not present

## 2013-01-20 DIAGNOSIS — M5137 Other intervertebral disc degeneration, lumbosacral region: Secondary | ICD-10-CM | POA: Diagnosis not present

## 2013-03-05 ENCOUNTER — Other Ambulatory Visit: Payer: Self-pay | Admitting: Dermatology

## 2013-03-05 DIAGNOSIS — D485 Neoplasm of uncertain behavior of skin: Secondary | ICD-10-CM | POA: Diagnosis not present

## 2013-03-05 DIAGNOSIS — L905 Scar conditions and fibrosis of skin: Secondary | ICD-10-CM | POA: Diagnosis not present

## 2013-03-11 DIAGNOSIS — M5137 Other intervertebral disc degeneration, lumbosacral region: Secondary | ICD-10-CM | POA: Diagnosis not present

## 2013-03-11 DIAGNOSIS — M999 Biomechanical lesion, unspecified: Secondary | ICD-10-CM | POA: Diagnosis not present

## 2013-03-23 DIAGNOSIS — L988 Other specified disorders of the skin and subcutaneous tissue: Secondary | ICD-10-CM | POA: Diagnosis not present

## 2013-03-23 DIAGNOSIS — D485 Neoplasm of uncertain behavior of skin: Secondary | ICD-10-CM | POA: Diagnosis not present

## 2013-03-23 DIAGNOSIS — L820 Inflamed seborrheic keratosis: Secondary | ICD-10-CM | POA: Diagnosis not present

## 2013-03-23 DIAGNOSIS — E1149 Type 2 diabetes mellitus with other diabetic neurological complication: Secondary | ICD-10-CM | POA: Diagnosis not present

## 2013-03-23 DIAGNOSIS — M2060 Acquired deformities of toe(s), unspecified, unspecified foot: Secondary | ICD-10-CM | POA: Diagnosis not present

## 2013-03-31 DIAGNOSIS — Z79899 Other long term (current) drug therapy: Secondary | ICD-10-CM | POA: Diagnosis not present

## 2013-03-31 DIAGNOSIS — Z5181 Encounter for therapeutic drug level monitoring: Secondary | ICD-10-CM | POA: Diagnosis not present

## 2013-03-31 DIAGNOSIS — G894 Chronic pain syndrome: Secondary | ICD-10-CM | POA: Diagnosis not present

## 2013-03-31 DIAGNOSIS — G609 Hereditary and idiopathic neuropathy, unspecified: Secondary | ICD-10-CM | POA: Diagnosis not present

## 2013-04-03 DIAGNOSIS — R7309 Other abnormal glucose: Secondary | ICD-10-CM | POA: Diagnosis not present

## 2013-04-03 DIAGNOSIS — H52209 Unspecified astigmatism, unspecified eye: Secondary | ICD-10-CM | POA: Diagnosis not present

## 2013-04-03 DIAGNOSIS — H524 Presbyopia: Secondary | ICD-10-CM | POA: Diagnosis not present

## 2013-04-03 DIAGNOSIS — H2510 Age-related nuclear cataract, unspecified eye: Secondary | ICD-10-CM | POA: Diagnosis not present

## 2013-04-07 DIAGNOSIS — E1149 Type 2 diabetes mellitus with other diabetic neurological complication: Secondary | ICD-10-CM | POA: Diagnosis not present

## 2013-04-07 DIAGNOSIS — M2060 Acquired deformities of toe(s), unspecified, unspecified foot: Secondary | ICD-10-CM | POA: Diagnosis not present

## 2013-04-07 DIAGNOSIS — L988 Other specified disorders of the skin and subcutaneous tissue: Secondary | ICD-10-CM | POA: Diagnosis not present

## 2013-04-07 DIAGNOSIS — D485 Neoplasm of uncertain behavior of skin: Secondary | ICD-10-CM | POA: Diagnosis not present

## 2013-04-14 ENCOUNTER — Other Ambulatory Visit: Payer: Self-pay | Admitting: *Deleted

## 2013-04-14 MED ORDER — CARVEDILOL 6.25 MG PO TABS: 6.2500 mg | ORAL_TABLET | Freq: Two times a day (BID) | ORAL | Status: DC

## 2013-04-22 DIAGNOSIS — M5137 Other intervertebral disc degeneration, lumbosacral region: Secondary | ICD-10-CM | POA: Diagnosis not present

## 2013-04-22 DIAGNOSIS — M999 Biomechanical lesion, unspecified: Secondary | ICD-10-CM | POA: Diagnosis not present

## 2013-05-08 DIAGNOSIS — M2060 Acquired deformities of toe(s), unspecified, unspecified foot: Secondary | ICD-10-CM | POA: Diagnosis not present

## 2013-05-08 DIAGNOSIS — E1149 Type 2 diabetes mellitus with other diabetic neurological complication: Secondary | ICD-10-CM | POA: Diagnosis not present

## 2013-06-23 DIAGNOSIS — G894 Chronic pain syndrome: Secondary | ICD-10-CM | POA: Diagnosis not present

## 2013-06-23 DIAGNOSIS — G609 Hereditary and idiopathic neuropathy, unspecified: Secondary | ICD-10-CM | POA: Diagnosis not present

## 2013-06-29 DIAGNOSIS — M5137 Other intervertebral disc degeneration, lumbosacral region: Secondary | ICD-10-CM | POA: Diagnosis not present

## 2013-06-29 DIAGNOSIS — M999 Biomechanical lesion, unspecified: Secondary | ICD-10-CM | POA: Diagnosis not present

## 2013-06-30 DIAGNOSIS — Z23 Encounter for immunization: Secondary | ICD-10-CM | POA: Diagnosis not present

## 2013-06-30 DIAGNOSIS — E119 Type 2 diabetes mellitus without complications: Secondary | ICD-10-CM | POA: Diagnosis not present

## 2013-06-30 DIAGNOSIS — E559 Vitamin D deficiency, unspecified: Secondary | ICD-10-CM | POA: Diagnosis not present

## 2013-06-30 DIAGNOSIS — I10 Essential (primary) hypertension: Secondary | ICD-10-CM | POA: Diagnosis not present

## 2013-07-06 DIAGNOSIS — M999 Biomechanical lesion, unspecified: Secondary | ICD-10-CM | POA: Diagnosis not present

## 2013-07-06 DIAGNOSIS — M5137 Other intervertebral disc degeneration, lumbosacral region: Secondary | ICD-10-CM | POA: Diagnosis not present

## 2013-07-13 ENCOUNTER — Encounter: Payer: Self-pay | Admitting: Cardiovascular Disease

## 2013-07-13 ENCOUNTER — Ambulatory Visit (INDEPENDENT_AMBULATORY_CARE_PROVIDER_SITE_OTHER): Payer: Medicare Other | Admitting: Cardiovascular Disease

## 2013-07-13 VITALS — BP 98/60 | HR 54 | Ht 71.0 in | Wt 202.5 lb

## 2013-07-13 DIAGNOSIS — I2510 Atherosclerotic heart disease of native coronary artery without angina pectoris: Secondary | ICD-10-CM | POA: Insufficient documentation

## 2013-07-13 DIAGNOSIS — E782 Mixed hyperlipidemia: Secondary | ICD-10-CM | POA: Diagnosis not present

## 2013-07-13 DIAGNOSIS — E119 Type 2 diabetes mellitus without complications: Secondary | ICD-10-CM | POA: Diagnosis not present

## 2013-07-13 DIAGNOSIS — G629 Polyneuropathy, unspecified: Secondary | ICD-10-CM | POA: Insufficient documentation

## 2013-07-13 DIAGNOSIS — G609 Hereditary and idiopathic neuropathy, unspecified: Secondary | ICD-10-CM | POA: Diagnosis not present

## 2013-07-13 MED ORDER — ROSUVASTATIN CALCIUM 40 MG PO TABS: 40.0000 mg | ORAL_TABLET | Freq: Every day | ORAL | Status: DC

## 2013-07-13 MED ORDER — CARVEDILOL 6.25 MG PO TABS: 6.2500 mg | ORAL_TABLET | Freq: Two times a day (BID) | ORAL | Status: DC

## 2013-07-13 MED ORDER — RAMIPRIL 10 MG PO CAPS: 10.0000 mg | ORAL_CAPSULE | Freq: Every day | ORAL | Status: DC

## 2013-07-13 MED ORDER — OMEGA-3-ACID ETHYL ESTERS 1 G PO CAPS: 2.0000 g | ORAL_CAPSULE | Freq: Every day | ORAL | Status: DC

## 2013-07-13 MED ORDER — CLOPIDOGREL BISULFATE 75 MG PO TABS: 75.0000 mg | ORAL_TABLET | Freq: Every day | ORAL | Status: DC

## 2013-07-13 MED ORDER — ISOSORBIDE MONONITRATE ER 30 MG PO TB24: 30.0000 mg | ORAL_TABLET | Freq: Every day | ORAL | Status: DC

## 2013-07-13 NOTE — Patient Instructions (Addendum)
Your physician recommends that you schedule a follow-up appointment in: 6 MONTHS  Your physician has requested that you have en exercise stress myoview. For further information please visit [email or web address] Please follow instruction sheet, as given. This will be done in April 2015.

## 2013-07-13 NOTE — Progress Notes (Signed)
Patient ID: Ruben [name], male   DOB: July 12, 1946, 67 y.o.   MRN: [id number]     HPI: Ruben [name] is a 67 y.o. male who presents cardiology followup evaluation. I last saw him in November 2013.  Mr. still has established coronary artery disease in 1997 underwent CABG surgery with a LIMA to the LAD, vein to the diagonal, vein to the obtuse marginal, and vein to the right coronary artery. In November 2004 he underwent stenting to the vein graft to the circumflex vessel. In December 2009 the graft to the circumflex was occluded and he underwent stenting of the left main into the superior ramus intermediate vessel. At that time he also underwent stenting of the vein graft to the right coronary artery. His last catheterization which was done for repeat chest pain in March 2013 showed patent stents. He has known occlusion of the graft that supplied the circumflex marginal vessel. The graft to diagonal vessel was patent and filled the proximal LAD system and the LIMA graft which filled the mid LAD system was widely patent.  He does have a history of mixed hyperlipidemia and has been on aggressive lipid-lowering therapy over the past 17 years. He also has developed a peripheral neuropathy. PE does have mild diabetes mellitus and is now followed by Dr. Kyra Searles for this problem he recently had laboratory done by Dr. Kyra Searles which showed a hemoglobin A1c at 5.5. he had normal renal function. His last lipid studies were done in April 2014 which showed a total close to 125 triglycerides 52 HDL 55 LDL 52 on aggressive combination therapy consisting of Niaspan 2 g daily, Crestor 40 mg daily, Lovaza2 g daily.  He denies recent chest pain. He does have issues with peripheral neuropathy and takes Lyrica as well as Nucynta.  History reviewed. No pertinent past medical history.  History reviewed. No pertinent past surgical history.  Allergies  Allergen Reactions  . Cymbalta [Duloxetine Hcl] Diarrhea     Current Outpatient Prescriptions  Medication Sig Dispense Refill  . aspirin EC 81 MG tablet Take 81 mg by mouth at bedtime.      . carvedilol (COREG) 6.25 MG tablet Take 1 tablet (6.25 mg total) by mouth 2 (two) times daily with a meal.  180 tablet  3  . Cholecalciferol (VITAMIN D PO) Take 1 tablet by mouth daily.      . clopidogrel (PLAVIX) 75 MG tablet Take 1 tablet (75 mg total) by mouth at bedtime.  90 tablet  3  . folic acid (FOLVITE) 1 MG tablet Take 1 mg by mouth daily.      Marland Kitchen HYDROcodone-acetaminophen (NORCO) 7.5-325 MG per tablet Take 1 tablet by mouth every 6 (six) hours as needed. For breakthrough pain      . isosorbide mononitrate (IMDUR) 30 MG 24 hr tablet Take 1 tablet (30 mg total) by mouth daily.  90 tablet  3  . L-Methylfolate-B6-B12 (METANX PO) Take by mouth 2 (two) times daily.      . niacin (NIASPAN) 1000 MG CR tablet Take 2,000 mg by mouth at bedtime.      Marland Kitchen omega-3 acid ethyl esters (LOVAZA) 1 G capsule Take 2 capsules (2 g total) by mouth daily.  180 capsule  3  . oxyCODONE-acetaminophen (PERCOCET) 7.5-325 MG per tablet Take 1 tablet by mouth daily as needed for pain.      . pioglitazone (ACTOS) 45 MG tablet Take 45 mg by mouth daily.      . pregabalin (LYRICA)  150 MG capsule Take 150 mg by mouth 4 (four) times daily.      . ramipril (ALTACE) 10 MG capsule Take 1 capsule (10 mg total) by mouth at bedtime.  90 capsule  3  . ranolazine (RANEXA) 1000 MG SR tablet Take 1,000 mg by mouth 2 (two) times daily.      . rosuvastatin (CRESTOR) 40 MG tablet Take 1 tablet (40 mg total) by mouth at bedtime.  90 tablet  3  . saxagliptin HCl (ONGLYZA) 5 MG TABS tablet Take 5 mg by mouth daily.      . Tapentadol HCl (NUCYNTA) 75 MG TABS Take 150 mg by mouth daily.       No current facility-administered medications for this visit.    History   Social History  . Marital Status: Single    Spouse Name: N/A    Number of Children: N/A  . Years of Education: N/A   Occupational  History  . Not on file.   Social History Main Topics  . Smoking status: Never Smoker   . Smokeless tobacco: Not on file  . Alcohol Use: Yes  . Drug Use: Yes  . Sexual Activity: Not on file   Other Topics Concern  . Not on file   Social History Narrative  . No narrative on file    Family History  Problem Relation Age of Onset  . Heart disease Father   . Diabetes Father   . Hyperlipidemia Sister    Socially he is married. His former wife was coming was [employer], an Fish farm manager. He has been married for several years with his present wife and has 2 stepchildren with her. He does exercise least 4-5 days per week. He has now retired. Previously he was an Airline pilot for [employer].    ROS is negative for fevers, chills or night sweats. He denies visual symptoms. He denies shortness of breath. He denies any further anginal symptoms. He denies presyncope or syncope. He takes his blood pressure at home and he states this typically is in the 105-110 range systolically. He denies abdominal pain, nausea vomiting or diarrhea. He denies change in bowel or bladder habits. He does have issues with peripheral neuropathy for which he takes pregabalin as well as the pentothal. He has diabetes but this has been excellently controlled with Actos Liza therapy. He denies claudication. He denies rash. He denies bleeding.  Other comprehensive 12 point system review is negative.  PE BP 98/60  Pulse 54  Ht 5\' 11"  (1.803 m)  Wt 202 lb 8 oz (91.853 kg)  BMI 28.26 kg/m2  General: Alert, oriented, no distress.  Skin: normal turgor, no rashes HEENT: Normocephalic, atraumatic. Pupils round and reactive; sclera anicteric;no lid lag.  Nose without nasal septal hypertrophy Mouth/Parynx benign; Mallinpatti scale 2 Neck: No JVD, no carotid briuts Lungs: clear to ausculatation and percussion; no wheezing or rales Heart: RRR, s1 s2 normal 1/6 systolic murmur. Abdomen: soft, nontender; no  hepatosplenomehaly, BS+; abdominal aorta nontender and not dilated by palpation. Pulses 2+ Extremities: no clubbing cyanosis or edema, Homan's sign negative  Neurologic: grossly nonfocal Psychologic: normal affect and mood.  ECG: Sinus bradycardia 54 beats per minute with first-degree AV block. Nonspecific T changes.  LABS:  BMET    Component Value Date/Time   NA 140 08/27/2008 0345   K 4.2 08/27/2008 0345   CL 108 08/27/2008 0345   CO2 28 08/27/2008 0345   GLUCOSE 112* 08/27/2008 0345   BUN 12 08/27/2008  0345   CREATININE 0.78 08/27/2008 0345   CALCIUM 8.4 08/27/2008 0345   GFRNONAA >60 08/27/2008 0345   GFRAA  Value: >60        The eGFR has been calculated using the MDRD equation. This calculation has not been validated in all clinical 08/27/2008 0345     Hepatic Function Panel     Component Value Date/Time   PROT 5.9* 08/26/2008 0605   ALBUMIN 3.5 08/26/2008 0605   AST 33 08/26/2008 0605   ALT 42 08/26/2008 0605   ALKPHOS 84 08/26/2008 0605   BILITOT 0.9 08/26/2008 0605     CBC    Component Value Date/Time   WBC 8.1 08/27/2008 0345   RBC 4.15* 08/27/2008 0345   HGB 12.9* 08/27/2008 0345   HCT 37.8* 08/27/2008 0345   PLT 138* 08/27/2008 0345   MCV 90.9 08/27/2008 0345   MCHC 34.1 08/27/2008 0345   RDW 14.5 08/27/2008 0345     BNP No results found for this basename: probnp    Lipid Panel  No results found for this basename: chol, trig, hdl, cholhdl, vldl, ldlcalc     RADIOLOGY: No results found.    ASSESSMENT AND PLAN: Mr. Asaph Serena is now 17 years status post CABG revascularization surgery. He is 10 years status post initial stenting of the graft to the circumflex vessel which subsequently did close. At last catheterization a stent to the left main/ramus vessel was widely patent as was the stent to the vein graft to the right coronary artery. His proximal LAD is being supplied by his diagonal vessel in his mid LAD is being supplied by the LIMA graft. His diabetes is  well controlled. His lipids are excellent on combination therapy. His last cardiac catheterization was in March 2013. In 6 months I am recommending he undergo a two-year followup exercise Myoview study to assess for scar/ischemia. He is asymptomatic despite having a somewhat low blood pressure. His blood pressure does get much lower I would recommend that he decrease his Altace to 5 mg but I will not do this presently. I will see him back in 6 months for followup evaluation following his stress study    Lennette Bihari, MD, [facility]  07/13/2013 2:12 PM

## 2013-07-14 ENCOUNTER — Encounter: Payer: Self-pay | Admitting: Cardiovascular Disease

## 2013-07-15 DIAGNOSIS — M999 Biomechanical lesion, unspecified: Secondary | ICD-10-CM | POA: Diagnosis not present

## 2013-07-15 DIAGNOSIS — M5137 Other intervertebral disc degeneration, lumbosacral region: Secondary | ICD-10-CM | POA: Diagnosis not present

## 2013-09-04 DIAGNOSIS — M5137 Other intervertebral disc degeneration, lumbosacral region: Secondary | ICD-10-CM | POA: Diagnosis not present

## 2013-09-04 DIAGNOSIS — M999 Biomechanical lesion, unspecified: Secondary | ICD-10-CM | POA: Diagnosis not present

## 2013-09-16 DIAGNOSIS — G609 Hereditary and idiopathic neuropathy, unspecified: Secondary | ICD-10-CM | POA: Diagnosis not present

## 2013-09-16 DIAGNOSIS — G894 Chronic pain syndrome: Secondary | ICD-10-CM | POA: Diagnosis not present

## 2013-09-28 DIAGNOSIS — M5137 Other intervertebral disc degeneration, lumbosacral region: Secondary | ICD-10-CM | POA: Diagnosis not present

## 2013-09-28 DIAGNOSIS — M9981 Other biomechanical lesions of cervical region: Secondary | ICD-10-CM | POA: Diagnosis not present

## 2013-09-28 DIAGNOSIS — M999 Biomechanical lesion, unspecified: Secondary | ICD-10-CM | POA: Diagnosis not present

## 2013-10-19 DIAGNOSIS — S93409A Sprain of unspecified ligament of unspecified ankle, initial encounter: Secondary | ICD-10-CM | POA: Diagnosis not present

## 2013-10-26 DIAGNOSIS — S93409A Sprain of unspecified ligament of unspecified ankle, initial encounter: Secondary | ICD-10-CM | POA: Diagnosis not present

## 2013-10-30 DIAGNOSIS — S93409A Sprain of unspecified ligament of unspecified ankle, initial encounter: Secondary | ICD-10-CM | POA: Diagnosis not present

## 2013-11-02 DIAGNOSIS — S93409A Sprain of unspecified ligament of unspecified ankle, initial encounter: Secondary | ICD-10-CM | POA: Diagnosis not present

## 2013-11-05 DIAGNOSIS — M25579 Pain in unspecified ankle and joints of unspecified foot: Secondary | ICD-10-CM | POA: Diagnosis not present

## 2013-11-09 DIAGNOSIS — S93409A Sprain of unspecified ligament of unspecified ankle, initial encounter: Secondary | ICD-10-CM | POA: Diagnosis not present

## 2013-11-11 DIAGNOSIS — M7989 Other specified soft tissue disorders: Secondary | ICD-10-CM | POA: Diagnosis not present

## 2013-11-11 DIAGNOSIS — E1149 Type 2 diabetes mellitus with other diabetic neurological complication: Secondary | ICD-10-CM | POA: Diagnosis not present

## 2013-11-12 DIAGNOSIS — S93409A Sprain of unspecified ligament of unspecified ankle, initial encounter: Secondary | ICD-10-CM | POA: Diagnosis not present

## 2013-11-16 DIAGNOSIS — M9981 Other biomechanical lesions of cervical region: Secondary | ICD-10-CM | POA: Diagnosis not present

## 2013-11-16 DIAGNOSIS — S93409A Sprain of unspecified ligament of unspecified ankle, initial encounter: Secondary | ICD-10-CM | POA: Diagnosis not present

## 2013-11-16 DIAGNOSIS — M5137 Other intervertebral disc degeneration, lumbosacral region: Secondary | ICD-10-CM | POA: Diagnosis not present

## 2013-11-16 DIAGNOSIS — M999 Biomechanical lesion, unspecified: Secondary | ICD-10-CM | POA: Diagnosis not present

## 2013-11-23 ENCOUNTER — Encounter: Payer: Self-pay | Admitting: Cardiovascular Disease

## 2013-11-23 ENCOUNTER — Other Ambulatory Visit: Payer: Self-pay

## 2013-11-23 MED ORDER — RANOLAZINE ER 1000 MG PO TB12: 1000.0000 mg | ORAL_TABLET | Freq: Two times a day (BID) | ORAL | Status: DC

## 2013-11-23 MED ORDER — NIACIN ER (ANTIHYPERLIPIDEMIC) 1000 MG PO TBCR: 2000.0000 mg | EXTENDED_RELEASE_TABLET | Freq: Every day | ORAL | Status: DC

## 2013-11-23 NOTE — Telephone Encounter (Signed)
Rx was sent to pharmacy electronically. 

## 2013-11-25 DIAGNOSIS — S93409A Sprain of unspecified ligament of unspecified ankle, initial encounter: Secondary | ICD-10-CM | POA: Diagnosis not present

## 2013-11-30 DIAGNOSIS — S93409A Sprain of unspecified ligament of unspecified ankle, initial encounter: Secondary | ICD-10-CM | POA: Diagnosis not present

## 2013-12-03 ENCOUNTER — Telehealth (HOSPITAL_COMMUNITY): Payer: Self-pay

## 2013-12-08 ENCOUNTER — Ambulatory Visit (HOSPITAL_COMMUNITY)
Admission: RE | Admit: 2013-12-08 | Discharge: 2013-12-08 | Disposition: A | Discharge status: Home / Self Care | Payer: Medicare Other | Source: Ambulatory Visit | Attending: Cardiovascular Disease | Admitting: Cardiovascular Disease

## 2013-12-08 DIAGNOSIS — I2510 Atherosclerotic heart disease of native coronary artery without angina pectoris: Secondary | ICD-10-CM | POA: Insufficient documentation

## 2013-12-08 DIAGNOSIS — Z951 Presence of aortocoronary bypass graft: Secondary | ICD-10-CM | POA: Insufficient documentation

## 2013-12-08 MED ORDER — REGADENOSON 0.4 MG/5ML IV SOLN
0.4000 mg | Freq: Once | INTRAVENOUS | Status: AC
  Administered 2013-12-08: 0.4 mg via INTRAVENOUS

## 2013-12-08 MED ORDER — TECHNETIUM TC 99M SESTAMIBI GENERIC - CARDIOLITE
9.9000 | Freq: Once | INTRAVENOUS | Status: AC | PRN
  Administered 2013-12-08: 10 via INTRAVENOUS

## 2013-12-08 MED ORDER — TECHNETIUM TC 99M SESTAMIBI GENERIC - CARDIOLITE
29.6000 | Freq: Once | INTRAVENOUS | Status: AC | PRN
  Administered 2013-12-08: 30 via INTRAVENOUS

## 2013-12-08 NOTE — Procedures (Addendum)
[facility] [address] [place] [address] [phone]  Cardiology Nuclear Med Study  Ruben [name] is a 68 y.o. male     MRN : [id number]     DOB: 1946-01-10  Procedure Date: 12/08/2013  Nuclear Med Background Indication for Stress Test:  Graft Patency, Stent Patency and Abnormal EKG History:  CAD;CABG-1997;STENT/PTCA-2009;cath in 2013= patent stents Cardiac Risk Factors: Family History - CAD, Lipids, NIDDM and Overweight  Symptoms:  Dizziness and Light-Headedness   Nuclear Pre-Procedure Caffeine/Decaff Intake:  9:00pm NPO After: 7:00am   IV Site: R Hand  IV 0.9% NS with Angio Cath:  22g  Chest Size (in):  42"  IV Started by: Azucena Cecil, RN  Height: 5\' 11"  (1.803 m)  Cup Size: n/a  BMI:  Body mass index is 28.19 kg/(m^2). Weight:  202 lb (91.627 kg)   Tech Comments:  Patient was changed to a Lexiscan after walking 9 min and unable to get his HR up. Stg. IV, was too fast.    Nuclear Med Study 1 or 2 day study: 1 day  Stress Test Type:  Stress  Order Authorizing Provider:  Shelva Majestic, MD   Resting Radionuclide: Technetium 45m Sestamibi  Resting Radionuclide Dose: 9.9 mCi   Stress Radionuclide:  Technetium 54m Sestamibi  Stress Radionuclide Dose: 29.6 mCi           Stress Protocol Rest HR: 55 Stress HR: 63  Rest BP: 167/93 Stress BP: 146/72  Exercise Time (min): n/a METS: n/a   Predicted Max HR: 154 bpm % Max HR: 44.16 bpm Rate Pressure Product: 11356  Dose of Adenosine (mg):  n/a Dose of Lexiscan: 0.4 mg  Dose of Atropine (mg): n/a Dose of Dobutamine: n/a mcg/kg/min (at max HR)  Stress Test Technologist: Leane Para, CCT Nuclear Technologist: Imagene Riches, CNMT   Rest Procedure:  Myocardial perfusion imaging was performed at rest 45 minutes following the intravenous administration of Technetium 39m Sestamibi. Stress Procedure:  The patient received IV Lexiscan 0.4 mg over 15-seconds.   Technetium 12m Sestamibi injected at 30-seconds.  There were no significant changes with Lexiscan.  Quantitative spect images were obtained after a 45 minute delay.  Transient Ischemic Dilatation (Normal <1.22):  0.91 Lung/Heart Ratio (Normal <0.45):  0.35 QGS EDV:  149 ml QGS ESV:  54 ml LV Ejection Fraction: 64%       Rest ECG: NSR-LVH  Stress ECG: Uninteretable due to motion artifact  QPS Raw Data Images:  Normal; no motion artifact; normal heart/lung ratio. Stress Images:  Normal homogeneous uptake in all areas of the myocardium. Rest Images:  Normal homogeneous uptake in all areas of the myocardium. Subtraction (SDS):  No evidence of ischemia. LV Wall Motion:  NL LV Function; NL Wall Motion  Impression Exercise Capacity:  Lexiscan with no exercise. BP Response:  Hypertensive blood pressure response. Clinical Symptoms:  No significant symptoms noted. ECG Impression:  uninterpretable ECG Comparison with Prior Nuclear Study: No significant change from previous study   Overall Impression:  Normal stress nuclear study.   Sanda Klein, MD  12/08/2013 12:55 PM

## 2013-12-09 DIAGNOSIS — G894 Chronic pain syndrome: Secondary | ICD-10-CM | POA: Diagnosis not present

## 2013-12-09 DIAGNOSIS — Z5181 Encounter for therapeutic drug level monitoring: Secondary | ICD-10-CM | POA: Diagnosis not present

## 2013-12-09 DIAGNOSIS — Z79899 Other long term (current) drug therapy: Secondary | ICD-10-CM | POA: Diagnosis not present

## 2013-12-09 DIAGNOSIS — G609 Hereditary and idiopathic neuropathy, unspecified: Secondary | ICD-10-CM | POA: Diagnosis not present

## 2013-12-16 DIAGNOSIS — M9981 Other biomechanical lesions of cervical region: Secondary | ICD-10-CM | POA: Diagnosis not present

## 2013-12-16 DIAGNOSIS — M5137 Other intervertebral disc degeneration, lumbosacral region: Secondary | ICD-10-CM | POA: Diagnosis not present

## 2013-12-16 DIAGNOSIS — M999 Biomechanical lesion, unspecified: Secondary | ICD-10-CM | POA: Diagnosis not present

## 2013-12-21 ENCOUNTER — Encounter: Payer: Self-pay | Admitting: *Deleted

## 2013-12-29 DIAGNOSIS — E559 Vitamin D deficiency, unspecified: Secondary | ICD-10-CM | POA: Diagnosis not present

## 2013-12-29 DIAGNOSIS — I10 Essential (primary) hypertension: Secondary | ICD-10-CM | POA: Diagnosis not present

## 2013-12-29 DIAGNOSIS — E119 Type 2 diabetes mellitus without complications: Secondary | ICD-10-CM | POA: Diagnosis not present

## 2014-01-05 DIAGNOSIS — E782 Mixed hyperlipidemia: Secondary | ICD-10-CM | POA: Diagnosis not present

## 2014-01-05 DIAGNOSIS — E119 Type 2 diabetes mellitus without complications: Secondary | ICD-10-CM | POA: Diagnosis not present

## 2014-01-05 DIAGNOSIS — I10 Essential (primary) hypertension: Secondary | ICD-10-CM | POA: Diagnosis not present

## 2014-01-05 DIAGNOSIS — G609 Hereditary and idiopathic neuropathy, unspecified: Secondary | ICD-10-CM | POA: Diagnosis not present

## 2014-01-05 DIAGNOSIS — E559 Vitamin D deficiency, unspecified: Secondary | ICD-10-CM | POA: Diagnosis not present

## 2014-01-11 ENCOUNTER — Ambulatory Visit (INDEPENDENT_AMBULATORY_CARE_PROVIDER_SITE_OTHER): Payer: Medicare Other | Admitting: Cardiovascular Disease

## 2014-01-11 ENCOUNTER — Encounter: Payer: Self-pay | Admitting: Cardiovascular Disease

## 2014-01-11 VITALS — BP 127/66 | HR 54 | Ht 71.0 in | Wt 205.2 lb

## 2014-01-11 DIAGNOSIS — I2510 Atherosclerotic heart disease of native coronary artery without angina pectoris: Secondary | ICD-10-CM

## 2014-01-11 DIAGNOSIS — Z79899 Other long term (current) drug therapy: Secondary | ICD-10-CM | POA: Diagnosis not present

## 2014-01-11 DIAGNOSIS — R5383 Other fatigue: Secondary | ICD-10-CM

## 2014-01-11 DIAGNOSIS — G629 Polyneuropathy, unspecified: Secondary | ICD-10-CM

## 2014-01-11 DIAGNOSIS — E782 Mixed hyperlipidemia: Secondary | ICD-10-CM | POA: Diagnosis not present

## 2014-01-11 DIAGNOSIS — R5381 Other malaise: Secondary | ICD-10-CM | POA: Diagnosis not present

## 2014-01-11 DIAGNOSIS — G609 Hereditary and idiopathic neuropathy, unspecified: Secondary | ICD-10-CM

## 2014-01-11 DIAGNOSIS — E119 Type 2 diabetes mellitus without complications: Secondary | ICD-10-CM

## 2014-01-11 NOTE — Patient Instructions (Signed)
Your physician recommends that you schedule a follow-up appointment in:  6 months to see Dr. Claiborne Billings

## 2014-01-13 ENCOUNTER — Encounter: Payer: Self-pay | Admitting: Cardiovascular Disease

## 2014-01-13 NOTE — Progress Notes (Signed)
Patient ID: Ruben [name], male   DOB: October 29, 1945, 68 y.o.   MRN: [id number]      HPI: Ruben [name] is a 68 y.o. male who presents for a 6 month cardiology followup evaluation.   Ruben [name] has established coronary artery disease and  In 1997 underwent CABG surgery with a LIMA to the LAD, vein to the diagonal, vein to the obtuse marginal, and vein to the right coronary artery. In November 2004 he underwent stenting to the vein graft to the circumflex vessel. In December 2009 the graft to the circumflex was occluded and he underwent stenting of the left main into the superior ramus intermediate vessel. At that time he also underwent stenting of the vein graft to the right coronary artery. His last catheterization which was done for repeat chest pain in March 2013 showed patent stents. He has known occlusion of the graft that supplied the circumflex marginal vessel. The graft to diagonal vessel was patent and filled the proximal LAD system and the LIMA graft which filled the mid LAD system was widely patent.  He has ahistory of mixed hyperlipidemia and has been on aggressive lipid-lowering therapy over the past 17 years. He also has developed a peripheral neuropathy. PE does have mild diabetes mellitus and is now followed by [name]. In April 2014 which showed a total cholesterol was 125, triglycerides 52, HDL 55, LDL 52 on aggressive combination therapy consisting of Niaspan 2 g daily, Crestor 40 mg daily, Lovaza 2 g daily.  Initially, we had followed him with [facility] heart lab assessments.  Over the past 6 months he denies recent chest pain. He does have peripheral neuropathy and takes Lyrica as well as Nucynta this seems to have stabilized.  On 12/08/2013.  He underwent a followup nuclear perfusion study.  This continued to show normal perfusion without scar or ischemia on his current medical therapy.  Ejection fraction was 64%.  History reviewed. No pertinent past medical history.  Past  Surgical History  Procedure Laterality Date  . Lower extremity arterial doppler  01/30/2000    Normal arterial study  . Cardiac catheterization  12/13/2011    No intervention - recommend medical therapy with increased medical trial  . Coronary artery bypass graft  1997    LIMA-LAD, vein graft to diagonal, vein graft to obtuse marginal, and vein graft to RCA  . Cardiovascular stress test  10/26/2010    No scintigraphic evidence of inducible myocardial ischemia. No ECG changes. EKG negative for ischemia.  . Transthoracic echocardiogram  12/12/2011    EF >55%, LA severely dilated.    Allergies  Allergen Reactions  . Cymbalta [Duloxetine Hcl] Diarrhea    Current Outpatient Prescriptions  Medication Sig Dispense Refill  . aspirin EC 81 MG tablet Take 81 mg by mouth at bedtime.      . carvedilol (COREG) 6.25 MG tablet Take 1 tablet (6.25 mg total) by mouth 2 (two) times daily with a meal.  180 tablet  3  . Cholecalciferol (VITAMIN D PO) Take 1 tablet by mouth daily.      . clopidogrel (PLAVIX) 75 MG tablet Take 1 tablet (75 mg total) by mouth at bedtime.  90 tablet  3  . HYDROcodone-acetaminophen (NORCO) 7.5-325 MG per tablet Take 1 tablet by mouth every 6 (six) hours as needed. For breakthrough pain      . isosorbide mononitrate (IMDUR) 30 MG 24 hr tablet Take 1 tablet (30 mg total) by mouth daily.  90 tablet  3  . L-Methylfolate-B6-B12 (METANX PO) Take by mouth 2 (two) times daily.      . niacin (NIASPAN) 1000 MG CR tablet Take 2 tablets (2,000 mg total) by mouth at bedtime.  180 tablet  2  . omega-3 acid ethyl esters (LOVAZA) 1 G capsule Take 2 capsules (2 g total) by mouth daily.  180 capsule  3  . oxyCODONE-acetaminophen (PERCOCET) 7.5-325 MG per tablet Take 1 tablet by mouth daily as needed for pain.      . pioglitazone (ACTOS) 45 MG tablet Take 45 mg by mouth daily.      . pregabalin (LYRICA) 150 MG capsule Take 150 mg by mouth 4 (four) times daily.      . ramipril (ALTACE) 10 MG capsule  Take 1 capsule (10 mg total) by mouth at bedtime.  90 capsule  3  . ranolazine (RANEXA) 1000 MG SR tablet Take 1 tablet (1,000 mg total) by mouth 2 (two) times daily.  180 tablet  2  . rosuvastatin (CRESTOR) 40 MG tablet Take 1 tablet (40 mg total) by mouth at bedtime.  90 tablet  3  . saxagliptin HCl (ONGLYZA) 5 MG TABS tablet Take 5 mg by mouth daily.      . Tapentadol HCl (NUCYNTA) 75 MG TABS Take 150 mg by mouth daily.       No current facility-administered medications for this visit.    History   Social History  . Marital Status: Single    Spouse Name: N/A    Number of Children: N/A  . Years of Education: N/A   Occupational History  . Not on file.   Social History Main Topics  . Smoking status: Never Smoker   . Smokeless tobacco: Never Used  . Alcohol Use: Yes  . Drug Use: Yes  . Sexual Activity: Not on file   Other Topics Concern  . Not on file   Social History Narrative  . No narrative on file    Family History  Problem Relation Age of Onset  . Heart disease Father   . Diabetes Father   . Hyperlipidemia Sister    Socially he is married. His former wife  was Ruben [name], an iinsurance agent. He has been married for several years with his present wife and has 2 stepchildren with her. He does exercise least 4-5 days per week. He has now retired. Previously he was an Optometrist for [employer].    ROS is negative for fevers, chills or night sweats. He denies visual symptoms. He denies shortness of breath. He denies any further anginal symptoms. He denies presyncope or syncope. He takes his blood pressure at home and he states this typically is in the 542-706 range systolically.  He is unaware of lymphadenopathy.  He denies muscular symptoms He denies abdominal pain, nausea vomiting or diarrhea. He denies change in bowel or bladder habits. He does have issues with peripheral neuropathy for which he takes pregabalin as well as the pentothal. He has diabetes but  this has been excellently controlled with Actos  and Ongliza therapy. He denies claudication. He denies rash. He denies bleeding.  He denies difficulty with sleep. Other comprehensive 14 point system review is negative.  PE BP 127/66  Pulse 54  Ht _0  (1.803 m)  Wt 205 lb 3.2 oz (93.078 kg)  BMI 28.63 kg/m2  General: Alert, oriented, no distress.  Skin: normal turgor, no rashes HEENT: Normocephalic, atraumatic. Pupils round and reactive; sclera anicteric;no lid lag.  Nose  without nasal septal hypertrophy Mouth/Parynx benign; Mallinpatti scale 2 Neck: No JVD, no carotid bruits with normal carotid upstroke Lungs: clear to ausculatation and percussion; no wheezing or rales Chest wall: Nontender to palpation Heart: RRR, s1 s2 normal 1/6 systolic murmur. Abdomen: soft, nontender; no hepatosplenomehaly, BS+; abdominal aorta nontender and not dilated by palpation. Back: No CVA tenderness Pulses 2+ Extremities: no clubbing cyanosis or edema, Homan's sign negative  Neurologic: grossly nonfocal Psychologic: normal affect and mood.   Prior 07/13/2013 ECG: Sinus bradycardia 54 beats per minute with first-degree AV block. Nonspecific T changes.  LABS:  BMET    Component Value Date/Time   NA 140 08/27/2008 0345   K 4.2 08/27/2008 0345   CL 108 08/27/2008 0345   CO2 28 08/27/2008 0345   GLUCOSE 112* 08/27/2008 0345   BUN 12 08/27/2008 0345   CREATININE 0.78 08/27/2008 0345   CALCIUM 8.4 08/27/2008 0345   GFRNONAA >60 08/27/2008 0345   GFRAA  Value: >60        The eGFR has been calculated using the MDRD equation. This calculation has not been validated in all clinical 08/27/2008 0345     Hepatic Function Panel     Component Value Date/Time   PROT 5.9* 08/26/2008 0605   ALBUMIN 3.5 08/26/2008 0605   AST 33 08/26/2008 0605   ALT 42 08/26/2008 0605   ALKPHOS 84 08/26/2008 0605   BILITOT 0.9 08/26/2008 0605     CBC    Component Value Date/Time   WBC 8.1 08/27/2008 0345   RBC 4.15*  08/27/2008 0345   HGB 12.9* 08/27/2008 0345   HCT 37.8* 08/27/2008 0345   PLT 138* 08/27/2008 0345   MCV 90.9 08/27/2008 0345   MCHC 34.1 08/27/2008 0345   RDW 14.5 08/27/2008 0345     BNP No results found for this basename: probnp    Lipid Panel  No results found for this basename: chol,  trig,  hdl,  cholhdl,  vldl,  ldlcalc     RADIOLOGY: No results found.    ASSESSMENT AND PLAN: Ruben [name] is now 18 years status post CABG revascularization surgery; 11 years status post initial stenting of the graft to the circumflex vessel which subsequently did close. At last catheterization  the stent to the left main/ramus vessel was widely patent as was the stent to the vein graft to the right coronary artery. His proximal LAD is being supplied by his diagonal vessel in his mid LAD is being supplied by the LIMA graft. His diabetes is well controlled and he tells me recent hemoglobin A1c by Dr. Jacqlyn Krauss was 5.8 on his current dose of Actos as well as Onglyza. His lipids have been consistently excellent over the past several years on aggressive combination therapy.  Initially, he had very low HDL levels in the 20s and had significant increase in small LDL particles. His last cardiac catheterization was in March 2013.  I reviewed with him in detail.  His most recent nuclear perfusion scan, which is unchanged from 3 years previously.   I also had a significant discussion with him concerning his medications.  We discussed the role of  ranexa and he feels that since he has been taking this that he has not had any recurrent anginal symptoms.  I am recommending a complete set of blood work obtained in the fasting state, consisting of a CMP, CBC, TSH, and will also obtain an NMR profile.  We also discussed inflammation and recent recommendations by Dr. Alvester Chou  Bobetta Lime , regarding a triglyceride to HDL ratio of 1  Troy Sine, MD, [facility]  01/13/2014 5:23 PM

## 2014-01-14 DIAGNOSIS — Z0000 Encounter for general adult medical examination without abnormal findings: Secondary | ICD-10-CM | POA: Diagnosis not present

## 2014-01-14 DIAGNOSIS — E119 Type 2 diabetes mellitus without complications: Secondary | ICD-10-CM | POA: Diagnosis not present

## 2014-01-14 DIAGNOSIS — G609 Hereditary and idiopathic neuropathy, unspecified: Secondary | ICD-10-CM | POA: Diagnosis not present

## 2014-01-14 DIAGNOSIS — I2510 Atherosclerotic heart disease of native coronary artery without angina pectoris: Secondary | ICD-10-CM | POA: Diagnosis not present

## 2014-01-14 DIAGNOSIS — E785 Hyperlipidemia, unspecified: Secondary | ICD-10-CM | POA: Diagnosis not present

## 2014-01-14 DIAGNOSIS — Z23 Encounter for immunization: Secondary | ICD-10-CM | POA: Diagnosis not present

## 2014-01-14 DIAGNOSIS — Z125 Encounter for screening for malignant neoplasm of prostate: Secondary | ICD-10-CM | POA: Diagnosis not present

## 2014-01-22 DIAGNOSIS — R52 Pain, unspecified: Secondary | ICD-10-CM | POA: Diagnosis not present

## 2014-01-22 DIAGNOSIS — J069 Acute upper respiratory infection, unspecified: Secondary | ICD-10-CM | POA: Diagnosis not present

## 2014-02-12 DIAGNOSIS — Z79899 Other long term (current) drug therapy: Secondary | ICD-10-CM | POA: Diagnosis not present

## 2014-02-12 DIAGNOSIS — R5381 Other malaise: Secondary | ICD-10-CM | POA: Diagnosis not present

## 2014-02-12 DIAGNOSIS — E782 Mixed hyperlipidemia: Secondary | ICD-10-CM | POA: Diagnosis not present

## 2014-02-12 LAB — CBC WITH DIFFERENTIAL/PLATELET
BASOS PCT: 0 % (ref 0–1)
Basophils Absolute: 0 10*3/uL (ref 0.0–0.1)
Eosinophils Absolute: 0.1 10*3/uL (ref 0.0–0.7)
Eosinophils Relative: 2 % (ref 0–5)
HCT: 39.5 % (ref 39.0–52.0)
HEMOGLOBIN: 13.5 g/dL (ref 13.0–17.0)
LYMPHS ABS: 0.7 10*3/uL (ref 0.7–4.0)
Lymphocytes Relative: 23 % (ref 12–46)
MCH: 30.5 pg (ref 26.0–34.0)
MCHC: 34.2 g/dL (ref 30.0–36.0)
MCV: 89.2 fL (ref 78.0–100.0)
MONOS PCT: 13 % — AB (ref 3–12)
Monocytes Absolute: 0.4 10*3/uL (ref 0.1–1.0)
NEUTROS ABS: 1.8 10*3/uL (ref 1.7–7.7)
NEUTROS PCT: 62 % (ref 43–77)
PLATELETS: 154 10*3/uL (ref 150–400)
RBC: 4.43 MIL/uL (ref 4.22–5.81)
RDW: 15.6 % — ABNORMAL HIGH (ref 11.5–15.5)
WBC: 2.9 10*3/uL — ABNORMAL LOW (ref 4.0–10.5)

## 2014-02-12 LAB — COMPLETE METABOLIC PANEL WITH GFR
ALBUMIN: 4.1 g/dL (ref 3.5–5.2)
ALK PHOS: 88 U/L (ref 39–117)
ALT: 20 U/L (ref 0–53)
AST: 29 U/L (ref 0–37)
BILIRUBIN TOTAL: 1 mg/dL (ref 0.2–1.2)
BUN: 13 mg/dL (ref 6–23)
CO2: 27 meq/L (ref 19–32)
Calcium: 8.8 mg/dL (ref 8.4–10.5)
Chloride: 105 mEq/L (ref 96–112)
Creat: 0.92 mg/dL (ref 0.50–1.35)
GFR, EST NON AFRICAN AMERICAN: 86 mL/min
GFR, Est African American: >89 mL/min
GLUCOSE: 125 mg/dL — AB (ref 70–99)
POTASSIUM: 4.3 meq/L (ref 3.5–5.3)
SODIUM: 138 meq/L (ref 135–145)
TOTAL PROTEIN: 6.1 g/dL (ref 6.0–8.3)

## 2014-02-12 LAB — NMR LIPOPROFILE WITH LIPIDS
Cholesterol, Total: 107 mg/dL (ref <200)
HDL PARTICLE NUMBER: 39.4 umol/L (ref >=30.5)
HDL SIZE: 10 nm (ref >=9.2)
HDL-C: 57 mg/dL (ref >=40)
LARGE HDL: 11.2 umol/L (ref >=4.8)
LARGE VLDL-P: 0.9 nmol/L (ref <=2.7)
LDL (calc): 43 mg/dL (ref <100)
LDL Particle Number: 371 nmol/L (ref <1000)
LDL Size: 20 nm — ABNORMAL LOW (ref >20.5)
LP-IR Score: <25 (ref <=45)
Small LDL Particle Number: 156 nmol/L (ref <=527)
TRIGLYCERIDES: 37 mg/dL (ref <150)
VLDL Size: 48.8 nm — ABNORMAL HIGH (ref <=46.6)

## 2014-02-12 LAB — TSH: TSH: 1.305 u[IU]/mL (ref 0.350–4.500)

## 2014-02-16 ENCOUNTER — Telehealth: Payer: Self-pay | Admitting: *Deleted

## 2014-02-16 DIAGNOSIS — Z79899 Other long term (current) drug therapy: Secondary | ICD-10-CM

## 2014-02-16 DIAGNOSIS — E785 Hyperlipidemia, unspecified: Secondary | ICD-10-CM

## 2014-02-16 NOTE — Telephone Encounter (Signed)
Patient returned a call to me. Lab results and recommendations were discussed per Dr. Evette Georges orders. Patient expressed understanding of instructions and requested a copy of labs to be sent to him.

## 2014-02-16 NOTE — Telephone Encounter (Signed)
Message copied by Lauralee Evener on Tue Feb 16, 2014 11:24 AM ------      Message from: Shelva Majestic A      Created: Mon Feb 15, 2014  9:47 AM       WBC low; LDL-p, TG aggressively Rx; rec decrease niaspan from 2000 mg to 1000 mg; HDL and HDL-P excellent, recheck in 3-4 months ------

## 2014-02-16 NOTE — Telephone Encounter (Signed)
Message copied by Lauralee Evener on Tue Feb 16, 2014 10:04 AM ------      Message from: Shelva Majestic A      Created: Mon Feb 15, 2014  9:47 AM       WBC low; LDL-p, TG aggressively Rx; rec decrease niaspan from 2000 mg to 1000 mg; HDL and HDL-P excellent, recheck in 3-4 months ------

## 2014-02-19 DIAGNOSIS — M9981 Other biomechanical lesions of cervical region: Secondary | ICD-10-CM | POA: Diagnosis not present

## 2014-02-19 DIAGNOSIS — M999 Biomechanical lesion, unspecified: Secondary | ICD-10-CM | POA: Diagnosis not present

## 2014-02-19 DIAGNOSIS — M5137 Other intervertebral disc degeneration, lumbosacral region: Secondary | ICD-10-CM | POA: Diagnosis not present

## 2014-03-18 DIAGNOSIS — G609 Hereditary and idiopathic neuropathy, unspecified: Secondary | ICD-10-CM | POA: Diagnosis not present

## 2014-03-18 DIAGNOSIS — G894 Chronic pain syndrome: Secondary | ICD-10-CM | POA: Diagnosis not present

## 2014-04-06 DIAGNOSIS — H52209 Unspecified astigmatism, unspecified eye: Secondary | ICD-10-CM | POA: Diagnosis not present

## 2014-04-06 DIAGNOSIS — E119 Type 2 diabetes mellitus without complications: Secondary | ICD-10-CM | POA: Diagnosis not present

## 2014-04-06 DIAGNOSIS — H43819 Vitreous degeneration, unspecified eye: Secondary | ICD-10-CM | POA: Diagnosis not present

## 2014-04-06 DIAGNOSIS — H2510 Age-related nuclear cataract, unspecified eye: Secondary | ICD-10-CM | POA: Diagnosis not present

## 2014-04-07 NOTE — Telephone Encounter (Signed)
Encounter complete. 

## 2014-04-09 DIAGNOSIS — M9981 Other biomechanical lesions of cervical region: Secondary | ICD-10-CM | POA: Diagnosis not present

## 2014-04-09 DIAGNOSIS — M5137 Other intervertebral disc degeneration, lumbosacral region: Secondary | ICD-10-CM | POA: Diagnosis not present

## 2014-04-09 DIAGNOSIS — M999 Biomechanical lesion, unspecified: Secondary | ICD-10-CM | POA: Diagnosis not present

## 2014-05-19 DIAGNOSIS — M2040 Other hammer toe(s) (acquired), unspecified foot: Secondary | ICD-10-CM | POA: Diagnosis not present

## 2014-05-19 DIAGNOSIS — E1149 Type 2 diabetes mellitus with other diabetic neurological complication: Secondary | ICD-10-CM | POA: Diagnosis not present

## 2014-06-10 DIAGNOSIS — G609 Hereditary and idiopathic neuropathy, unspecified: Secondary | ICD-10-CM | POA: Diagnosis not present

## 2014-06-10 DIAGNOSIS — G894 Chronic pain syndrome: Secondary | ICD-10-CM | POA: Diagnosis not present

## 2014-06-10 DIAGNOSIS — Z79899 Other long term (current) drug therapy: Secondary | ICD-10-CM | POA: Diagnosis not present

## 2014-06-18 DIAGNOSIS — M5137 Other intervertebral disc degeneration, lumbosacral region: Secondary | ICD-10-CM | POA: Diagnosis not present

## 2014-06-18 DIAGNOSIS — M5030 Other cervical disc degeneration, unspecified cervical region: Secondary | ICD-10-CM | POA: Diagnosis not present

## 2014-06-18 DIAGNOSIS — M9981 Other biomechanical lesions of cervical region: Secondary | ICD-10-CM | POA: Diagnosis not present

## 2014-06-18 DIAGNOSIS — M999 Biomechanical lesion, unspecified: Secondary | ICD-10-CM | POA: Diagnosis not present

## 2014-06-22 ENCOUNTER — Encounter: Payer: Self-pay | Admitting: Cardiovascular Disease

## 2014-06-22 ENCOUNTER — Ambulatory Visit (INDEPENDENT_AMBULATORY_CARE_PROVIDER_SITE_OTHER): Payer: Medicare Other | Admitting: Cardiovascular Disease

## 2014-06-22 VITALS — BP 126/71 | HR 56 | Ht 71.0 in | Wt 193.7 lb

## 2014-06-22 DIAGNOSIS — I2510 Atherosclerotic heart disease of native coronary artery without angina pectoris: Secondary | ICD-10-CM

## 2014-06-22 DIAGNOSIS — G609 Hereditary and idiopathic neuropathy, unspecified: Secondary | ICD-10-CM

## 2014-06-22 DIAGNOSIS — E119 Type 2 diabetes mellitus without complications: Secondary | ICD-10-CM | POA: Diagnosis not present

## 2014-06-22 DIAGNOSIS — G629 Polyneuropathy, unspecified: Secondary | ICD-10-CM

## 2014-06-22 DIAGNOSIS — E782 Mixed hyperlipidemia: Secondary | ICD-10-CM

## 2014-06-22 NOTE — Progress Notes (Signed)
Patient ID: Ruben [name], male   DOB: September 03, 1946, 68 y.o.   MRN: [id number]      HPI: Ruben [name] is a 68 y.o. male who presents for a 6 month cardiology followup evaluation.   Ruben [name] has established coronary artery disease and in 997 underwent CABG surgery with a LIMA to the LAD, vein to the diagonal, vein to the obtuse marginal, and vein to the right coronary artery. In November 2004 he underwent stenting to the vein graft to the circumflex vessel. In December 2009 the graft to the circumflex was occluded and he underwent stenting of the left main into the superior ramus intermediate vessel. At that time he also underwent stenting of the vein graft to the right coronary artery. His last catheterization which was done for repeat chest pain in March 2013 showed patent stents. He has known occlusion of the graft that supplied the circumflex marginal vessel. The graft to diagonal vessel was patent and filled the proximal LAD system and the LIMA graft which filled the mid LAD system was widely patent.  He has ahistory of mixed hyperlipidemia and has been on aggressive lipid-lowering therapy over the past 18 years. He also has developed a peripheral neuropathy. He has mild diabetes mellitus and is now followed by [name]. In April 2014 which labs showed a total cholesterol was 125, triglycerides 52, HDL 55, LDL 52 on aggressive combination therapy consisting of Niaspan 2 g daily, Crestor 40 mg daily, Lovaza 2 g daily.  Initially, we had followed him with [facility] heart lab assessments.  Over the past 6 months he denies recent chest pain. He does have peripheral neuropathy and takes Lyrica as well as Nucynta this seems to have stabilized.  On 12/08/2013 a followup nuclear perfusion study continued to show normal perfusion without scar or ischemia on his current medical therapy.  Ejection fraction was 64%.  Since I last saw him, he has been exercising regularly.  He is retired for several  years and has more time. He walks up to 6 miles per day approximately 4-5 days per week and does yoga 3 days per week.  He denies any recurrent anginal symptoms.  He denies any shortness of breath.  He denies PND, orthopnea.  He denies palpitations.  Repeat blood work in May 2015 showed significant aggressive lipid management with an LDL particle number now at 371 and calculated LDL 43, HDL 57, triglycerides 37, total cholesterol 107.  Small LDL particles of 156.  He still had an increased VLDL size.  Insulin resistance score was less than 25.  At that time, I recommended he reduce his Niaspan from 2000 mg to 1000 mg a discussed the possibility of discontinuing this altogether in the future depending upon subsequent levels.    History reviewed. No pertinent past medical history.  Past Surgical History  Procedure Laterality Date  . Lower extremity arterial doppler  01/30/2000    Normal arterial study  . Cardiac catheterization  12/13/2011    No intervention - recommend medical therapy with increased medical trial  . Coronary artery bypass graft  1997    LIMA-LAD, vein graft to diagonal, vein graft to obtuse marginal, and vein graft to RCA  . Cardiovascular stress test  10/26/2010    No scintigraphic evidence of inducible myocardial ischemia. No ECG changes. EKG negative for ischemia.  . Transthoracic echocardiogram  12/12/2011    EF >55%, LA severely dilated.    Allergies  Allergen Reactions  . Cymbalta [Duloxetine  Hcl] Diarrhea    Current Outpatient Prescriptions  Medication Sig Dispense Refill  . aspirin EC 81 MG tablet Take 81 mg by mouth at bedtime.      . carvedilol (COREG) 6.25 MG tablet Take 1 tablet (6.25 mg total) by mouth 2 (two) times daily with a meal.  180 tablet  3  . clopidogrel (PLAVIX) 75 MG tablet Take 1 tablet (75 mg total) by mouth at bedtime.  90 tablet  3  . HYDROcodone-acetaminophen (NORCO) 7.5-325 MG per tablet Take 1 tablet by mouth every 6 (six) hours as needed. For  breakthrough pain      . isosorbide mononitrate (IMDUR) 30 MG 24 hr tablet Take 1 tablet (30 mg total) by mouth daily.  90 tablet  3  . L-Methylfolate-B6-B12 (METANX PO) Take by mouth 2 (two) times daily.      . niacin (NIASPAN) 1000 MG CR tablet Take 1,000 mg by mouth at bedtime.      Marland Kitchen omega-3 acid ethyl esters (LOVAZA) 1 G capsule Take 2 capsules (2 g total) by mouth daily.  180 capsule  3  . oxyCODONE-acetaminophen (PERCOCET) 7.5-325 MG per tablet Take 1 tablet by mouth daily as needed for pain.      . pioglitazone (ACTOS) 45 MG tablet Take 45 mg by mouth daily.      . pregabalin (LYRICA) 150 MG capsule Take 150 mg by mouth 4 (four) times daily.      . ramipril (ALTACE) 10 MG capsule Take 1 capsule (10 mg total) by mouth at bedtime.  90 capsule  3  . ranolazine (RANEXA) 1000 MG SR tablet Take 1 tablet (1,000 mg total) by mouth 2 (two) times daily.  180 tablet  2  . rosuvastatin (CRESTOR) 40 MG tablet Take 1 tablet (40 mg total) by mouth at bedtime.  90 tablet  3  . saxagliptin HCl (ONGLYZA) 5 MG TABS tablet Take 5 mg by mouth daily.       No current facility-administered medications for this visit.    History   Social History  . Marital Status: Single    Spouse Name: N/A    Number of Children: N/A  . Years of Education: N/A   Occupational History  . Not on file.   Social History Main Topics  . Smoking status: Never Smoker   . Smokeless tobacco: Never Used  . Alcohol Use: Yes  . Drug Use: Yes  . Sexual Activity: Not on file   Other Topics Concern  . Not on file   Social History Narrative  . No narrative on file    Family History  Problem Relation Age of Onset  . Heart disease Father   . Diabetes Father   . Hyperlipidemia Sister    Socially he is married. His former wife  was Dory Larsen, an iinsurance agent. He has been married for several years with his present wife and has 2 stepchildren with her. He does exercise least 4-5 days per week. He has now retired.  Previously he was an Optometrist for [employer].   ROS General: Negative; No fevers, chills, or night sweats;  HEENT: Negative; No changes in vision or hearing, sinus congestion, difficulty swallowing Pulmonary: Negative; No cough, wheezing, shortness of breath, hemoptysis Cardiovascular: Negative; No chest pain, presyncope, syncope, palpitations GI: Negative; No nausea, vomiting, diarrhea, or abdominal pain GU: Negative; No dysuria, hematuria, or difficulty voiding Musculoskeletal: Negative; no myalgias, joint pain, or weakness Hematologic/Oncology: Negative; no easy bruising, bleeding Endocrine: Positive for  diabetes mellitus, type II; no heat/cold intolerance;  Neuro: Positive for peripheral neuropathy; no changes in balance, headaches Skin: Negative; No rashes or skin lesions Psychiatric: Negative; No behavioral problems, depression Sleep: Negative; No snoring, daytime sleepiness, hypersomnolence, bruxism, restless legs, hypnogognic hallucinations, no cataplexy Other comprehensive 14 point system review is negative.    PE BP 126/71  Pulse 56  Ht 5' 11"  (1.803 m)  Wt 193 lb 11.2 oz (87.862 kg)  BMI 27.03 kg/m2  General: Alert, oriented, no distress.  Skin: normal turgor, no rashes HEENT: Normocephalic, atraumatic. Pupils round and reactive; sclera anicteric;no lid lag.  Nose without nasal septal hypertrophy Mouth/Parynx benign; Mallinpatti scale 2 Neck: No JVD, no carotid bruits with normal carotid upstroke Lungs: clear to ausculatation and percussion; no wheezing or rales Chest wall: Nontender to palpation Heart: RRR, s1 s2 normal 1/6 systolic murmur.  No diastolic murmur.  No rubs, thrills or heaves Abdomen: soft, nontender; no hepatosplenomehaly, BS+; abdominal aorta nontender and not dilated by palpation. Back: No CVA tenderness Pulses 2+ Extremities: no clubbing cyanosis or edema, Homan's sign negative  Neurologic: grossly nonfocal Psychologic: normal affect  and mood.  ECG (independently read by me): Sinus bradycardia 55 beats per minute.  First degree AV block with PR interval 258 ms.  Mild RV conduction delay.  Nonspecific T changes.  Prior 07/13/2013 ECG: Sinus bradycardia 54 beats per minute with first-degree AV block. Nonspecific T changes.  LABS:  BMET    Component Value Date/Time   NA 138 02/12/2014 0801   K 4.3 02/12/2014 0801   CL 105 02/12/2014 0801   CO2 27 02/12/2014 0801   GLUCOSE 125* 02/12/2014 0801   BUN 13 02/12/2014 0801   CREATININE 0.92 02/12/2014 0801   CREATININE 0.78 08/27/2008 0345   CALCIUM 8.8 02/12/2014 0801   GFRNONAA 86 02/12/2014 0801   GFRNONAA >60 08/27/2008 0345   GFRAA >89 02/12/2014 0801   GFRAA  Value: >60        The eGFR has been calculated using the MDRD equation. This calculation has not been validated in all clinical 08/27/2008 0345     Hepatic Function Panel     Component Value Date/Time   PROT 6.1 02/12/2014 0801   ALBUMIN 4.1 02/12/2014 0801   AST 29 02/12/2014 0801   ALT 20 02/12/2014 0801   ALKPHOS 88 02/12/2014 0801   BILITOT 1.0 02/12/2014 0801     CBC    Component Value Date/Time   WBC 2.9* 02/12/2014 0801   RBC 4.43 02/12/2014 0801   HGB 13.5 02/12/2014 0801   HCT 39.5 02/12/2014 0801   PLT 154 02/12/2014 0801   MCV 89.2 02/12/2014 0801   MCH 30.5 02/12/2014 0801   MCHC 34.2 02/12/2014 0801   RDW 15.6* 02/12/2014 0801   LYMPHSABS 0.7 02/12/2014 0801   MONOABS 0.4 02/12/2014 0801   EOSABS 0.1 02/12/2014 0801   BASOSABS 0.0 02/12/2014 0801     BNP No results found for this basename: probnp    Lipid Panel  No results found for this basename: chol,  trig,  hdl,  cholhdl,  vldl,  ldlcalc     RADIOLOGY: No results found.    ASSESSMENT AND PLAN: Mr. Tajai Ihde is now 18 years status post CABG revascularization surgery; 11 years status post initial stenting of the graft to the circumflex vessel which subsequently did close. At last catheterization  the stent to the left main/ramus  vessel was widely patent as was the stent to the vein graft to  the right coronary artery. His proximal LAD is being supplied by his diagonal vessel in his mid LAD is being supplied by the LIMA graft.  His blood pressure is well-controlled today at 128/74 on carvedilol 6.25 twice a day, ramipril, 10 mg daily.  His diabetes is well controlled and is followed by by Ruben [name].  Recent hemoglobin A1c was was 5.8 on his current dose of Actos as well as Onglyza.  An NMR profile.  His insulin resistance was normal at less than 25. His lipids have been consistently excellent over the past several years on aggressive combination therapy.  Initially, he had very low HDL levels in the 20s and had significant increase in small LDL particles.  His most recent NMR profile revealed continued aggressive therapy with LDL particles.  #371, calculated LDL 43, small, LDL particles.  At 156, and a triglyceride to HDL ratio of approximately less than 0.7. We also discussed inflammation and recent recommendations regarding a triglyceride to HDL ratio of 1. He now is on a reduced dose of Niaspan, which was reduced from 2000 to 1000 mg.  I have suggested that in December he have a six-month followup NMR profile.  If his particle number continues to be exceptionally low, as well as his HDL remaining high,  triglycerides low with small LDL particles low it may be worthwhile to consider discontinuing Niaspan at that time. His last cardiac catheterization was in March 2013. His most recent nuclear perfusion scan, which is unchanged from 3 years previously.   I also had a significant discussion with him concerning his medications.  We discussed the role of  ranexa and he feels that since he has been taking this that he has not had any recurrent anginal symptoms.  In December, he will undergo her 6 month followup compressive metabolic panel, and NMR profile.  As long as he remains stable, I will see him in one year for cardiology  reevaluation.  Ruben Sine, MD, [facility]  06/22/2014 5:47 PM

## 2014-06-22 NOTE — Patient Instructions (Signed)
Your physician recommends that you return for lab work in: 3 months.  Your physician wants you to follow-up in: 1 year. You will receive a reminder letter in the mail two months in advance. If you don't receive a letter, please call our office to schedule the follow-up appointment.

## 2014-07-05 ENCOUNTER — Telehealth: Payer: Self-pay | Admitting: Cardiovascular Disease

## 2014-07-05 MED ORDER — ROSUVASTATIN CALCIUM 40 MG PO TABS: 40.0000 mg | ORAL_TABLET | Freq: Every day | ORAL | Status: DC

## 2014-07-05 MED ORDER — RAMIPRIL 10 MG PO CAPS: 10.0000 mg | ORAL_CAPSULE | Freq: Every day | ORAL | Status: DC

## 2014-07-05 MED ORDER — CLOPIDOGREL BISULFATE 75 MG PO TABS: 75.0000 mg | ORAL_TABLET | Freq: Every day | ORAL | Status: DC

## 2014-07-05 MED ORDER — OMEGA-3-ACID ETHYL ESTERS 1 G PO CAPS: 2.0000 g | ORAL_CAPSULE | Freq: Every day | ORAL | Status: DC

## 2014-07-05 MED ORDER — RANOLAZINE ER 1000 MG PO TB12: 1000.0000 mg | ORAL_TABLET | Freq: Two times a day (BID) | ORAL | Status: DC

## 2014-07-05 MED ORDER — ISOSORBIDE MONONITRATE ER 30 MG PO TB24: 30.0000 mg | ORAL_TABLET | Freq: Every day | ORAL | Status: DC

## 2014-07-05 MED ORDER — CARVEDILOL 6.25 MG PO TABS: 6.2500 mg | ORAL_TABLET | Freq: Two times a day (BID) | ORAL | Status: DC

## 2014-07-05 MED ORDER — NIACIN ER (ANTIHYPERLIPIDEMIC) 1000 MG PO TBCR: 1000.0000 mg | EXTENDED_RELEASE_TABLET | Freq: Every day | ORAL | Status: DC

## 2014-07-05 NOTE — Telephone Encounter (Signed)
Ruben [name] called in stating that he spoke with Mariann Laster when he was in the office on 9/29 about having his meds swtiched over to [facility] and he stated that none of his prescriptions have been received as of yet. Please call  Thanks

## 2014-07-05 NOTE — Telephone Encounter (Signed)
Left message for patient, refills have been sent to [facility].

## 2014-07-09 DIAGNOSIS — E119 Type 2 diabetes mellitus without complications: Secondary | ICD-10-CM | POA: Diagnosis not present

## 2014-07-09 DIAGNOSIS — E559 Vitamin D deficiency, unspecified: Secondary | ICD-10-CM | POA: Diagnosis not present

## 2014-07-12 DIAGNOSIS — M5136 Other intervertebral disc degeneration, lumbar region: Secondary | ICD-10-CM | POA: Diagnosis not present

## 2014-07-12 DIAGNOSIS — M9901 Segmental and somatic dysfunction of cervical region: Secondary | ICD-10-CM | POA: Diagnosis not present

## 2014-07-12 DIAGNOSIS — M9904 Segmental and somatic dysfunction of sacral region: Secondary | ICD-10-CM | POA: Diagnosis not present

## 2014-07-12 DIAGNOSIS — M5030 Other cervical disc degeneration, unspecified cervical region: Secondary | ICD-10-CM | POA: Diagnosis not present

## 2014-08-03 ENCOUNTER — Other Ambulatory Visit: Payer: Self-pay | Admitting: Cardiovascular Disease

## 2014-08-03 DIAGNOSIS — E1142 Type 2 diabetes mellitus with diabetic polyneuropathy: Secondary | ICD-10-CM | POA: Diagnosis not present

## 2014-08-03 DIAGNOSIS — S90229A Contusion of unspecified lesser toe(s) with damage to nail, initial encounter: Secondary | ICD-10-CM | POA: Diagnosis not present

## 2014-08-03 NOTE — Telephone Encounter (Signed)
Pt says he still have not received his prescription for his nitroglycerin spray. Would you please call to [phone]

## 2014-08-11 MED ORDER — NITROGLYCERIN 0.4 MG/SPRAY TL SOLN: 1.0000 | Status: DC | PRN

## 2014-08-11 NOTE — Telephone Encounter (Signed)
Rx refill sent to patient pharmacy   

## 2014-09-02 ENCOUNTER — Encounter (HOSPITAL_COMMUNITY): Payer: Self-pay | Admitting: Cardiovascular Disease

## 2014-09-08 DIAGNOSIS — M9903 Segmental and somatic dysfunction of lumbar region: Secondary | ICD-10-CM | POA: Diagnosis not present

## 2014-09-08 DIAGNOSIS — M5136 Other intervertebral disc degeneration, lumbar region: Secondary | ICD-10-CM | POA: Diagnosis not present

## 2014-09-08 DIAGNOSIS — M9904 Segmental and somatic dysfunction of sacral region: Secondary | ICD-10-CM | POA: Diagnosis not present

## 2014-09-08 DIAGNOSIS — M9901 Segmental and somatic dysfunction of cervical region: Secondary | ICD-10-CM | POA: Diagnosis not present

## 2014-09-08 DIAGNOSIS — M9902 Segmental and somatic dysfunction of thoracic region: Secondary | ICD-10-CM | POA: Diagnosis not present

## 2014-09-09 DIAGNOSIS — G609 Hereditary and idiopathic neuropathy, unspecified: Secondary | ICD-10-CM | POA: Diagnosis not present

## 2014-09-09 DIAGNOSIS — G894 Chronic pain syndrome: Secondary | ICD-10-CM | POA: Diagnosis not present

## 2014-09-09 DIAGNOSIS — Z79899 Other long term (current) drug therapy: Secondary | ICD-10-CM | POA: Diagnosis not present

## 2014-09-09 DIAGNOSIS — Z79891 Long term (current) use of opiate analgesic: Secondary | ICD-10-CM | POA: Diagnosis not present

## 2014-09-09 DIAGNOSIS — Z5181 Encounter for therapeutic drug level monitoring: Secondary | ICD-10-CM | POA: Diagnosis not present

## 2014-09-29 DIAGNOSIS — I2510 Atherosclerotic heart disease of native coronary artery without angina pectoris: Secondary | ICD-10-CM | POA: Diagnosis not present

## 2014-09-29 DIAGNOSIS — E782 Mixed hyperlipidemia: Secondary | ICD-10-CM | POA: Diagnosis not present

## 2014-09-30 LAB — COMPREHENSIVE METABOLIC PANEL
ALT: 18 U/L (ref 0–53)
AST: 22 U/L (ref 0–37)
Albumin: 4.3 g/dL (ref 3.5–5.2)
Alkaline Phosphatase: 89 U/L (ref 39–117)
BILIRUBIN TOTAL: 1 mg/dL (ref 0.2–1.2)
BUN: 15 mg/dL (ref 6–23)
CALCIUM: 9.1 mg/dL (ref 8.4–10.5)
CHLORIDE: 102 meq/L (ref 96–112)
CO2: 28 mEq/L (ref 19–32)
CREATININE: 0.84 mg/dL (ref 0.50–1.35)
Glucose, Bld: 105 mg/dL — ABNORMAL HIGH (ref 70–99)
Potassium: 4.5 mEq/L (ref 3.5–5.3)
Sodium: 138 mEq/L (ref 135–145)
Total Protein: 6.4 g/dL (ref 6.0–8.3)

## 2014-09-30 LAB — LIPID PANEL
Cholesterol: 133 mg/dL (ref 0–200)
HDL: 67 mg/dL (ref >39)
LDL Cholesterol: 56 mg/dL (ref 0–99)
TRIGLYCERIDES: 52 mg/dL (ref <150)
Total CHOL/HDL Ratio: 2 Ratio
VLDL: 10 mg/dL (ref 0–40)

## 2014-10-05 ENCOUNTER — Encounter: Payer: Self-pay | Admitting: *Deleted

## 2014-10-13 DIAGNOSIS — M5030 Other cervical disc degeneration, unspecified cervical region: Secondary | ICD-10-CM | POA: Diagnosis not present

## 2014-10-13 DIAGNOSIS — M9903 Segmental and somatic dysfunction of lumbar region: Secondary | ICD-10-CM | POA: Diagnosis not present

## 2014-10-13 DIAGNOSIS — M9902 Segmental and somatic dysfunction of thoracic region: Secondary | ICD-10-CM | POA: Diagnosis not present

## 2014-10-13 DIAGNOSIS — M9904 Segmental and somatic dysfunction of sacral region: Secondary | ICD-10-CM | POA: Diagnosis not present

## 2014-10-13 DIAGNOSIS — M5136 Other intervertebral disc degeneration, lumbar region: Secondary | ICD-10-CM | POA: Diagnosis not present

## 2014-10-13 DIAGNOSIS — M9901 Segmental and somatic dysfunction of cervical region: Secondary | ICD-10-CM | POA: Diagnosis not present

## 2014-10-18 DIAGNOSIS — M9902 Segmental and somatic dysfunction of thoracic region: Secondary | ICD-10-CM | POA: Diagnosis not present

## 2014-10-18 DIAGNOSIS — M9903 Segmental and somatic dysfunction of lumbar region: Secondary | ICD-10-CM | POA: Diagnosis not present

## 2014-10-18 DIAGNOSIS — M9904 Segmental and somatic dysfunction of sacral region: Secondary | ICD-10-CM | POA: Diagnosis not present

## 2014-10-18 DIAGNOSIS — M5136 Other intervertebral disc degeneration, lumbar region: Secondary | ICD-10-CM | POA: Diagnosis not present

## 2014-10-18 DIAGNOSIS — M9901 Segmental and somatic dysfunction of cervical region: Secondary | ICD-10-CM | POA: Diagnosis not present

## 2014-10-18 DIAGNOSIS — M5030 Other cervical disc degeneration, unspecified cervical region: Secondary | ICD-10-CM | POA: Diagnosis not present

## 2014-11-16 DIAGNOSIS — E119 Type 2 diabetes mellitus without complications: Secondary | ICD-10-CM | POA: Diagnosis not present

## 2014-11-16 DIAGNOSIS — R2689 Other abnormalities of gait and mobility: Secondary | ICD-10-CM | POA: Diagnosis not present

## 2014-11-29 DIAGNOSIS — M9901 Segmental and somatic dysfunction of cervical region: Secondary | ICD-10-CM | POA: Diagnosis not present

## 2014-11-29 DIAGNOSIS — M9903 Segmental and somatic dysfunction of lumbar region: Secondary | ICD-10-CM | POA: Diagnosis not present

## 2014-11-29 DIAGNOSIS — M9902 Segmental and somatic dysfunction of thoracic region: Secondary | ICD-10-CM | POA: Diagnosis not present

## 2014-11-29 DIAGNOSIS — M5030 Other cervical disc degeneration, unspecified cervical region: Secondary | ICD-10-CM | POA: Diagnosis not present

## 2014-11-29 DIAGNOSIS — M5134 Other intervertebral disc degeneration, thoracic region: Secondary | ICD-10-CM | POA: Diagnosis not present

## 2014-11-29 DIAGNOSIS — M5136 Other intervertebral disc degeneration, lumbar region: Secondary | ICD-10-CM | POA: Diagnosis not present

## 2014-12-02 DIAGNOSIS — R2689 Other abnormalities of gait and mobility: Secondary | ICD-10-CM | POA: Diagnosis not present

## 2014-12-16 DIAGNOSIS — G894 Chronic pain syndrome: Secondary | ICD-10-CM | POA: Diagnosis not present

## 2014-12-16 DIAGNOSIS — G609 Hereditary and idiopathic neuropathy, unspecified: Secondary | ICD-10-CM | POA: Diagnosis not present

## 2014-12-30 DIAGNOSIS — R2689 Other abnormalities of gait and mobility: Secondary | ICD-10-CM | POA: Diagnosis not present

## 2015-01-07 DIAGNOSIS — E559 Vitamin D deficiency, unspecified: Secondary | ICD-10-CM | POA: Diagnosis not present

## 2015-01-07 DIAGNOSIS — E782 Mixed hyperlipidemia: Secondary | ICD-10-CM | POA: Diagnosis not present

## 2015-01-07 DIAGNOSIS — E119 Type 2 diabetes mellitus without complications: Secondary | ICD-10-CM | POA: Diagnosis not present

## 2015-01-11 DIAGNOSIS — I10 Essential (primary) hypertension: Secondary | ICD-10-CM | POA: Diagnosis not present

## 2015-01-11 DIAGNOSIS — E1159 Type 2 diabetes mellitus with other circulatory complications: Secondary | ICD-10-CM | POA: Diagnosis not present

## 2015-01-11 DIAGNOSIS — I2510 Atherosclerotic heart disease of native coronary artery without angina pectoris: Secondary | ICD-10-CM | POA: Diagnosis not present

## 2015-01-11 DIAGNOSIS — E782 Mixed hyperlipidemia: Secondary | ICD-10-CM | POA: Diagnosis not present

## 2015-01-11 DIAGNOSIS — G609 Hereditary and idiopathic neuropathy, unspecified: Secondary | ICD-10-CM | POA: Diagnosis not present

## 2015-01-11 DIAGNOSIS — E559 Vitamin D deficiency, unspecified: Secondary | ICD-10-CM | POA: Diagnosis not present

## 2015-01-17 DIAGNOSIS — M9902 Segmental and somatic dysfunction of thoracic region: Secondary | ICD-10-CM | POA: Diagnosis not present

## 2015-01-17 DIAGNOSIS — M5030 Other cervical disc degeneration, unspecified cervical region: Secondary | ICD-10-CM | POA: Diagnosis not present

## 2015-01-17 DIAGNOSIS — M9901 Segmental and somatic dysfunction of cervical region: Secondary | ICD-10-CM | POA: Diagnosis not present

## 2015-01-17 DIAGNOSIS — M9903 Segmental and somatic dysfunction of lumbar region: Secondary | ICD-10-CM | POA: Diagnosis not present

## 2015-01-17 DIAGNOSIS — M5136 Other intervertebral disc degeneration, lumbar region: Secondary | ICD-10-CM | POA: Diagnosis not present

## 2015-01-17 DIAGNOSIS — M5134 Other intervertebral disc degeneration, thoracic region: Secondary | ICD-10-CM | POA: Diagnosis not present

## 2015-01-18 DIAGNOSIS — E1142 Type 2 diabetes mellitus with diabetic polyneuropathy: Secondary | ICD-10-CM | POA: Diagnosis not present

## 2015-01-18 DIAGNOSIS — R2689 Other abnormalities of gait and mobility: Secondary | ICD-10-CM | POA: Diagnosis not present

## 2015-01-20 DIAGNOSIS — E1142 Type 2 diabetes mellitus with diabetic polyneuropathy: Secondary | ICD-10-CM | POA: Diagnosis not present

## 2015-01-20 DIAGNOSIS — Z23 Encounter for immunization: Secondary | ICD-10-CM | POA: Diagnosis not present

## 2015-01-20 DIAGNOSIS — Z125 Encounter for screening for malignant neoplasm of prostate: Secondary | ICD-10-CM | POA: Diagnosis not present

## 2015-01-20 DIAGNOSIS — G629 Polyneuropathy, unspecified: Secondary | ICD-10-CM | POA: Diagnosis not present

## 2015-01-20 DIAGNOSIS — I2510 Atherosclerotic heart disease of native coronary artery without angina pectoris: Secondary | ICD-10-CM | POA: Diagnosis not present

## 2015-01-20 DIAGNOSIS — E782 Mixed hyperlipidemia: Secondary | ICD-10-CM | POA: Diagnosis not present

## 2015-01-20 DIAGNOSIS — I10 Essential (primary) hypertension: Secondary | ICD-10-CM | POA: Diagnosis not present

## 2015-01-20 DIAGNOSIS — Z0001 Encounter for general adult medical examination with abnormal findings: Secondary | ICD-10-CM | POA: Diagnosis not present

## 2015-02-08 DIAGNOSIS — Z79891 Long term (current) use of opiate analgesic: Secondary | ICD-10-CM | POA: Diagnosis not present

## 2015-02-08 DIAGNOSIS — G894 Chronic pain syndrome: Secondary | ICD-10-CM | POA: Diagnosis not present

## 2015-02-08 DIAGNOSIS — G609 Hereditary and idiopathic neuropathy, unspecified: Secondary | ICD-10-CM | POA: Diagnosis not present

## 2015-02-10 DIAGNOSIS — R2689 Other abnormalities of gait and mobility: Secondary | ICD-10-CM | POA: Diagnosis not present

## 2015-02-28 DIAGNOSIS — M9903 Segmental and somatic dysfunction of lumbar region: Secondary | ICD-10-CM | POA: Diagnosis not present

## 2015-02-28 DIAGNOSIS — M9901 Segmental and somatic dysfunction of cervical region: Secondary | ICD-10-CM | POA: Diagnosis not present

## 2015-02-28 DIAGNOSIS — M5136 Other intervertebral disc degeneration, lumbar region: Secondary | ICD-10-CM | POA: Diagnosis not present

## 2015-02-28 DIAGNOSIS — M5030 Other cervical disc degeneration, unspecified cervical region: Secondary | ICD-10-CM | POA: Diagnosis not present

## 2015-02-28 DIAGNOSIS — M9902 Segmental and somatic dysfunction of thoracic region: Secondary | ICD-10-CM | POA: Diagnosis not present

## 2015-03-04 DIAGNOSIS — E1142 Type 2 diabetes mellitus with diabetic polyneuropathy: Secondary | ICD-10-CM | POA: Diagnosis not present

## 2015-03-04 DIAGNOSIS — R2689 Other abnormalities of gait and mobility: Secondary | ICD-10-CM | POA: Diagnosis not present

## 2015-04-04 DIAGNOSIS — M5030 Other cervical disc degeneration, unspecified cervical region: Secondary | ICD-10-CM | POA: Diagnosis not present

## 2015-04-04 DIAGNOSIS — M9903 Segmental and somatic dysfunction of lumbar region: Secondary | ICD-10-CM | POA: Diagnosis not present

## 2015-04-04 DIAGNOSIS — M5136 Other intervertebral disc degeneration, lumbar region: Secondary | ICD-10-CM | POA: Diagnosis not present

## 2015-04-04 DIAGNOSIS — M9901 Segmental and somatic dysfunction of cervical region: Secondary | ICD-10-CM | POA: Diagnosis not present

## 2015-04-04 DIAGNOSIS — M9902 Segmental and somatic dysfunction of thoracic region: Secondary | ICD-10-CM | POA: Diagnosis not present

## 2015-05-02 DIAGNOSIS — Z5181 Encounter for therapeutic drug level monitoring: Secondary | ICD-10-CM | POA: Diagnosis not present

## 2015-05-02 DIAGNOSIS — G609 Hereditary and idiopathic neuropathy, unspecified: Secondary | ICD-10-CM | POA: Diagnosis not present

## 2015-05-02 DIAGNOSIS — Z79891 Long term (current) use of opiate analgesic: Secondary | ICD-10-CM | POA: Diagnosis not present

## 2015-05-02 DIAGNOSIS — G894 Chronic pain syndrome: Secondary | ICD-10-CM | POA: Diagnosis not present

## 2015-05-02 DIAGNOSIS — Z79899 Other long term (current) drug therapy: Secondary | ICD-10-CM | POA: Diagnosis not present

## 2015-05-16 DIAGNOSIS — M9901 Segmental and somatic dysfunction of cervical region: Secondary | ICD-10-CM | POA: Diagnosis not present

## 2015-05-16 DIAGNOSIS — M9902 Segmental and somatic dysfunction of thoracic region: Secondary | ICD-10-CM | POA: Diagnosis not present

## 2015-05-16 DIAGNOSIS — M9903 Segmental and somatic dysfunction of lumbar region: Secondary | ICD-10-CM | POA: Diagnosis not present

## 2015-05-16 DIAGNOSIS — M5030 Other cervical disc degeneration, unspecified cervical region: Secondary | ICD-10-CM | POA: Diagnosis not present

## 2015-05-16 DIAGNOSIS — M5136 Other intervertebral disc degeneration, lumbar region: Secondary | ICD-10-CM | POA: Diagnosis not present

## 2015-05-27 DIAGNOSIS — H25013 Cortical age-related cataract, bilateral: Secondary | ICD-10-CM | POA: Diagnosis not present

## 2015-05-27 DIAGNOSIS — E119 Type 2 diabetes mellitus without complications: Secondary | ICD-10-CM | POA: Diagnosis not present

## 2015-05-27 DIAGNOSIS — H524 Presbyopia: Secondary | ICD-10-CM | POA: Diagnosis not present

## 2015-05-27 DIAGNOSIS — H2513 Age-related nuclear cataract, bilateral: Secondary | ICD-10-CM | POA: Diagnosis not present

## 2015-06-24 ENCOUNTER — Ambulatory Visit (INDEPENDENT_AMBULATORY_CARE_PROVIDER_SITE_OTHER): Payer: Medicare Other | Admitting: Cardiovascular Disease

## 2015-06-24 ENCOUNTER — Other Ambulatory Visit: Payer: Self-pay | Admitting: Cardiovascular Disease

## 2015-06-24 ENCOUNTER — Encounter: Payer: Self-pay | Admitting: Cardiovascular Disease

## 2015-06-24 VITALS — BP 100/64 | HR 59 | Ht 71.0 in | Wt 203.7 lb

## 2015-06-24 DIAGNOSIS — Z79899 Other long term (current) drug therapy: Secondary | ICD-10-CM

## 2015-06-24 DIAGNOSIS — R5383 Other fatigue: Secondary | ICD-10-CM

## 2015-06-24 DIAGNOSIS — I2510 Atherosclerotic heart disease of native coronary artery without angina pectoris: Secondary | ICD-10-CM | POA: Diagnosis not present

## 2015-06-24 DIAGNOSIS — E782 Mixed hyperlipidemia: Secondary | ICD-10-CM | POA: Diagnosis not present

## 2015-06-24 DIAGNOSIS — Z951 Presence of aortocoronary bypass graft: Secondary | ICD-10-CM | POA: Diagnosis not present

## 2015-06-24 DIAGNOSIS — E1140 Type 2 diabetes mellitus with diabetic neuropathy, unspecified: Secondary | ICD-10-CM

## 2015-06-24 DIAGNOSIS — G629 Polyneuropathy, unspecified: Secondary | ICD-10-CM

## 2015-06-24 MED ORDER — CLOPIDOGREL BISULFATE 75 MG PO TABS: 75.0000 mg | ORAL_TABLET | Freq: Every day | ORAL | Status: DC

## 2015-06-24 MED ORDER — ROSUVASTATIN CALCIUM 40 MG PO TABS: 40.0000 mg | ORAL_TABLET | Freq: Every day | ORAL | Status: DC

## 2015-06-24 MED ORDER — CARVEDILOL 6.25 MG PO TABS: 6.2500 mg | ORAL_TABLET | Freq: Two times a day (BID) | ORAL | Status: DC

## 2015-06-24 MED ORDER — RAMIPRIL 10 MG PO CAPS: 10.0000 mg | ORAL_CAPSULE | Freq: Every day | ORAL | Status: DC

## 2015-06-24 MED ORDER — ISOSORBIDE MONONITRATE ER 30 MG PO TB24: 30.0000 mg | ORAL_TABLET | Freq: Every day | ORAL | Status: DC

## 2015-06-24 MED ORDER — NIACIN ER (ANTIHYPERLIPIDEMIC) 1000 MG PO TBCR: 1000.0000 mg | EXTENDED_RELEASE_TABLET | Freq: Every day | ORAL | Status: DC

## 2015-06-24 MED ORDER — RANOLAZINE ER 1000 MG PO TB12: 1000.0000 mg | ORAL_TABLET | Freq: Two times a day (BID) | ORAL | Status: DC

## 2015-06-24 MED ORDER — OMEGA-3-ACID ETHYL ESTERS 1 G PO CAPS: 2.0000 g | ORAL_CAPSULE | Freq: Every day | ORAL | Status: DC

## 2015-06-24 NOTE — Telephone Encounter (Signed)
Pt was here this morning,all his medicine was sent to the wrong pharmacy. It should have been [facility] and not [facility].

## 2015-06-24 NOTE — Telephone Encounter (Signed)
Rx(s) sent to pharmacy electronically to mail order pharmacy

## 2015-06-24 NOTE — Progress Notes (Signed)
Patient ID: Ruben [name], male   DOB: 1946/01/10, 69 y.o.   MRN: [id number]     HPI: Ruben [name] is a 69 y.o. male who presents for a one year cardiology followup evaluation.   Ruben [name] has established CAD and in 1997 underwent CABG surgery with a LIMA to the LAD, vein to the diagonal, vein to the obtuse marginal, and vein to the right coronary artery. In November 2004 he underwent stenting to the vein graft to the circumflex vessel. In December 2009 the graft to the circumflex was occluded and he underwent stenting of the left main into the superior ramus intermediate vessel. At that time he also underwent stenting of the vein graft to the right coronary artery. His last catheterization which was done for repeat chest pain in March 2013 showed patent stents. He has known occlusion of the graft that supplied the circumflex marginal vessel. The graft to diagonal vessel was patent and filled the proximal LAD system and the LIMA graft which filled the mid LAD system was widely patent.  He has ahistory of mixed hyperlipidemia and has been on aggressive lipid-lowering therapy over the past 18 years. He also has developed a peripheral neuropathy. He has mild diabetes mellitus and is now followed by [name]. In April 2014 which labs showed a total cholesterol was 125, triglycerides 52, HDL 55, LDL 52 on aggressive combination therapy consisting of Niaspan 2 g daily, Crestor 40 mg daily, Lovaza 2 g daily.  Initially, we had followed him with [facility] heart lab assessments.  On 12/08/2013 a followup nuclear perfusion study continued to show normal perfusion without scar or ischemia on his current medical therapy.  Ejection fraction was 64%.  He is retired for several years and has more time.  He exercises regularly and walks up to 5-7 miles per day approximately 4-5 days per week and does yoga 3 days per week.  He denies any recurrent anginal symptoms.  He denies any shortness of breath.  He denies  PND, orthopnea.  He denies palpitations.   Laboratory in May 2015 showed significant aggressive lipid management with an LDL particle number now at 371 and calculated LDL 43, HDL 57, triglycerides 37, total cholesterol 107.  Small LDL particles of 156.  He still had an increased VLDL size.  Insulin resistance score was less than 25.  At that time, I recommended he reduce his Niaspan from 2000 mg to 1000 mg a discussed the possibility of discontinuing this altogether in the future depending upon subsequent levels.  Over the past year he has continued to remain stable.  He has a peripheral neuropathy.  This is stable as well, which he takes Lyrica.  He is on angle eyes and Actos for diabetes mellitus.  He continues to be on dual platelet therapy and denies bleeding.  He denies PND, orthopnea.  He presents for evaluation.   Past Surgical History  Procedure Laterality Date  . Lower extremity arterial doppler  01/30/2000    Normal arterial study  . Cardiac catheterization  12/13/2011    No intervention - recommend medical therapy with increased medical trial  . Coronary artery bypass graft  1997    LIMA-LAD, vein graft to diagonal, vein graft to obtuse marginal, and vein graft to RCA  . Cardiovascular stress test  10/26/2010    No scintigraphic evidence of inducible myocardial ischemia. No ECG changes. EKG negative for ischemia.  . Transthoracic echocardiogram  12/12/2011    EF >55%, LA severely  dilated.  . Left heart catheterization with coronary/graft angiogram N/A 12/13/2011    Procedure: LEFT HEART CATHETERIZATION WITH Beatrix Fetters;  Surgeon: Troy Sine, MD;  Location: [facility] CATH LAB;  Service: Cardiovascular;  Laterality: N/A;    Allergies  Allergen Reactions  . Cymbalta [Duloxetine Hcl] Diarrhea    Current Outpatient Prescriptions  Medication Sig Dispense Refill  . aspirin EC 81 MG tablet Take 81 mg by mouth at bedtime.    . carvedilol (COREG) 6.25 MG tablet Take 1 tablet (6.25 mg  total) by mouth 2 (two) times daily with a meal. 180 tablet 3  . clopidogrel (PLAVIX) 75 MG tablet Take 1 tablet (75 mg total) by mouth at bedtime. 90 tablet 3  . isosorbide mononitrate (IMDUR) 30 MG 24 hr tablet Take 1 tablet (30 mg total) by mouth daily. 90 tablet 3  . L-Methylfolate-B6-B12 (METANX PO) Take by mouth 2 (two) times daily.    . niacin (NIASPAN) 1000 MG CR tablet Take 1 tablet (1,000 mg total) by mouth at bedtime. 90 tablet 3  . nitroGLYCERIN (NITROLINGUAL) 0.4 MG/SPRAY spray Place 1 spray under the tongue every 5 (five) minutes x 3 doses as needed for chest pain. 12 g 1  . omega-3 acid ethyl esters (LOVAZA) 1 G capsule Take 2 capsules (2 g total) by mouth daily. 180 capsule 3  . oxyCODONE-acetaminophen (PERCOCET) 7.5-325 MG per tablet Take 1 tablet by mouth daily as needed for pain.    . pioglitazone (ACTOS) 45 MG tablet Take 45 mg by mouth daily.    . pregabalin (LYRICA) 150 MG capsule Take 150 mg by mouth 4 (four) times daily.    . ramipril (ALTACE) 10 MG capsule Take 1 capsule (10 mg total) by mouth at bedtime. 90 capsule 3  . ranolazine (RANEXA) 1000 MG SR tablet Take 1 tablet (1,000 mg total) by mouth 2 (two) times daily. 180 tablet 3  . rosuvastatin (CRESTOR) 40 MG tablet Take 1 tablet (40 mg total) by mouth at bedtime. 90 tablet 3  . saxagliptin HCl (ONGLYZA) 5 MG TABS tablet Take 5 mg by mouth daily.    . Tapentadol HCl 150 MG TB12 Take 150 mg by mouth every 12 (twelve) hours.     No current facility-administered medications for this visit.    Social History   Social History  . Marital Status: Single    Spouse Name: N/A  . Number of Children: N/A  . Years of Education: N/A   Occupational History  . Not on file.   Social History Main Topics  . Smoking status: Never Smoker   . Smokeless tobacco: Never Used  . Alcohol Use: Yes  . Drug Use: Yes  . Sexual Activity: Not on file   Other Topics Concern  . Not on file   Social History Narrative    Family  History  Problem Relation Age of Onset  . Heart disease Father   . Diabetes Father   . Hyperlipidemia Sister    Socially he is married. His former wife  was Dory Larsen, an iins[employer] agent. He has been married for several years with his present wife and has 2 stepchildren with her. He does exercise least 4-5 days per week. He has now retired. Previously he was an Optometrist for [employer].   ROS General: Negative; No fevers, chills, or night sweats;  HEENT: Negative; No changes in vision or hearing, sinus congestion, difficulty swallowing Pulmonary: Negative; No cough, wheezing, shortness of breath, hemoptysis Cardiovascular: Negative; No  chest pain, presyncope, syncope, palpitations GI: Negative; No nausea, vomiting, diarrhea, or abdominal pain GU: Negative; No dysuria, hematuria, or difficulty voiding Musculoskeletal: Negative; no myalgias, joint pain, or weakness Hematologic/Oncology: Negative; no easy bruising, bleeding Endocrine: Positive for diabetes mellitus, type II; no heat/cold intolerance;  Neuro: Positive for peripheral neuropathy; no changes in balance, headaches Skin: Negative; No rashes or skin lesions Psychiatric: Negative; No behavioral problems, depression Sleep: Negative; No snoring, daytime sleepiness, hypersomnolence, bruxism, restless legs, hypnogognic hallucinations, no cataplexy Other comprehensive 14 point system review is negative.    PE BP 100/64 mmHg  Pulse 59  Ht 5' 11" (1.803 m)  Wt 203 lb 11.2 oz (92.398 kg)  BMI 28.42 kg/m2  Wt Readings from Last 3 Encounters:  06/24/15 203 lb 11.2 oz (92.398 kg)  06/22/14 193 lb 11.2 oz (87.862 kg)  01/11/14 205 lb 3.2 oz (93.078 kg)   General: Alert, oriented, no distress.  Skin: normal turgor, no rashes HEENT: Normocephalic, atraumatic. Pupils round and reactive; sclera anicteric;no lid lag.  Nose without nasal septal hypertrophy Mouth/Parynx benign; Mallinpatti scale 2 Neck: No JVD, no carotid  bruits with normal carotid upstroke Lungs: clear to ausculatation and percussion; no wheezing or rales Chest wall: Nontender to palpation Heart: RRR, s1 s2 normal 1/6 systolic murmur.  No diastolic murmur.  No rubs, thrills or heaves Abdomen: soft, nontender; no hepatosplenomehaly, BS+; abdominal aorta nontender and not dilated by palpation. Back: No CVA tenderness Pulses 2+ Extremities: no clubbing cyanosis or edema, Homan's sign negative  Neurologic: grossly nonfocal Psychologic: normal affect and mood.  ECG (independently read by me): Sinus bradycardia at 59 bpm.  First degree AV block with a PR interval at 23 ms.  Incomplete right bundle branch block.  No significant ST segment changes.  September 2015 ECG (independently read by me): Sinus bradycardia 55 beats per minute.  First degree AV block with PR interval 258 ms.  Mild RV conduction delay.  Nonspecific T changes.  Prior 07/13/2013 ECG: Sinus bradycardia 54 beats per minute with first-degree AV block. Nonspecific T changes.  LABS: BMP Latest Ref Rng 09/29/2014 02/12/2014 08/27/2008  Glucose 70 - 99 mg/dL 105(H) 125(H) 112(H)  BUN 6 - 23 mg/dL _0 Creatinine 0.50 - 1.35 mg/dL 0.84 0.92 0.78  Sodium 135 - 145 mEq/L 138 138 140  Potassium 3.5 - 5.3 mEq/L 4.5 4.3 4.2  Chloride 96 - 112 mEq/L 102 105 108  CO2 19 - 32 mEq/L _1 Calcium 8.4 - 10.5 mg/dL 9.1 8.8 8.4   Hepatic Function Latest Ref Rng 09/29/2014 02/12/2014 08/26/2008  Total Protein 6.0 - 8.3 g/dL 6.4 6.1 5.9(L)  Albumin 3.5 - 5.2 g/dL 4.3 4.1 3.5  AST 0 - 37 U/L 22 29 33  ALT 0 - 53 U/L 18 20 42  Alk Phosphatase 39 - 117 U/L 89 88 84  Total Bilirubin 0.2 - 1.2 mg/dL 1.0 1.0 0.9   CBC Latest Ref Rng 02/12/2014 08/27/2008 08/26/2008  WBC 4.0 - 10.5 K/uL 2.9(L) 8.1 6.3  Hemoglobin 13.0 - 17.0 g/dL 13.5 12.9(L) 13.8  Hematocrit 39.0 - 52.0 % 39.5 37.8(L) 40.0  Platelets 150 - 400 K/uL 154 138(L) 154   Lab Results  Component Value Date   MCV 89.2 02/12/2014    MCV 90.9 08/27/2008   MCV 91.1 08/26/2008   Lab Results  Component Value Date   TSH 1.305 02/12/2014  No results found for: HGBA1C  Lipid Panel     Component Value Date/Time  CHOL 133 09/29/2014 0853   CHOL 107 02/12/2014 0801   TRIG 52 09/29/2014 0853   TRIG 37 02/12/2014 0801   HDL 67 09/29/2014 0853   HDL 57 02/12/2014 0801   CHOLHDL 2.0 09/29/2014 0853   VLDL 10 09/29/2014 0853   LDLCALC 56 09/29/2014 0853   LDLCALC 43 02/12/2014 0801    RADIOLOGY: No results found.    ASSESSMENT AND PLAN: Ruben [name] is a 69 year old white male who underwent CABG revascularization surgery in 1997; and 12 years status post initial stenting of the graft to the circumflex vessel which subsequently did close, and 7 years s/p stenting of his left main into the superior ramus branch and stenting to the vein graft supplying the RCA.  At last catheterization  the stent to the left main/ramus vessel was widely patent as was the stent to the vein graft to the right coronary artery. His proximal LAD is being supplied by his diagonal vessel in his mid LAD is being supplied by the LIMA graft.  His blood pressure is well-controlled today at 128/74 on carvedilol 6.25 twice a day, ramipril, 10 mg daily.  He is not having any anginal symptoms and continues to take isosorbide mononitrate 30 mg in addition to ranolazine 1000 g twice a day.  His diabetes is well controlled and is followed by by Dr. Jacqlyn Krauss.  Recent hemoglobin A1c was was 5.8 on his current dose of Actos as well as Onglyza.  An NMR profile.  His insulin resistance was normal at less than 25. His lipids have been consistently excellent over the past several years on aggressive combination therapy.  Initially, he had very low HDL levels in the 20s and had significant increase in small LDL particles.  His last NMR profile revealed continued aggressive therapy with LDL particles.  #371, calculated LDL 43, small, LDL particles at 156, and a  triglyceride to HDL ratio of approximately less than 0.7. We also discussed inflammation and recent recommendations regarding a triglyceride to HDL ratio of 1. He now is on a reduced dose of Niaspan, which was reduced from 2000 to 1000 mg.  he continues to exercise vigorously without symptomatology.  At times he does note some mild bruising.  When he does mild resistance training with weights.  His last nuclear perfusion study was in March 2015.  His last echo Doppler study was in March 2013.  In 6 months, I am recommending that he undergo a 4 year follow-up echo Doppler study to reassess his systolic and diastolic function and valvular architecture.  At that time, a complete set of blood work will be obtained.  He has requested in follow-up of his NMR profile and I will schedule this.  He also questioned about inflammatory markers.  I will also check a C-reactive protein.  I will see him in follow-up and further recommendations will be made at that time.  Time spent: 25 minutes  Troy Sine, MD, [facility]  06/24/2015 3:53 PM

## 2015-06-24 NOTE — Patient Instructions (Addendum)
Your medications have all been refilled for a 90 day supply.   Your physician has requested that you have an echocardiogram in March 2017. Echocardiography is a painless test that uses sound waves to create images of your heart. It provides your doctor with information about the size and shape of your heart and how well your heart's chambers and valves are working. This procedure takes approximately one hour. There are no restrictions for this procedure.  Your physician recommends that you return for lab work FASTING - in March 2017  Your physician recommends that you schedule a follow-up appointment in March/April 2017 with Dr. Claiborne Billings

## 2015-06-29 DIAGNOSIS — Z23 Encounter for immunization: Secondary | ICD-10-CM | POA: Diagnosis not present

## 2015-07-15 DIAGNOSIS — E1159 Type 2 diabetes mellitus with other circulatory complications: Secondary | ICD-10-CM | POA: Diagnosis not present

## 2015-07-21 DIAGNOSIS — L602 Onychogryphosis: Secondary | ICD-10-CM | POA: Diagnosis not present

## 2015-07-21 DIAGNOSIS — E1142 Type 2 diabetes mellitus with diabetic polyneuropathy: Secondary | ICD-10-CM | POA: Diagnosis not present

## 2015-07-22 DIAGNOSIS — Z79891 Long term (current) use of opiate analgesic: Secondary | ICD-10-CM | POA: Diagnosis not present

## 2015-07-22 DIAGNOSIS — G894 Chronic pain syndrome: Secondary | ICD-10-CM | POA: Diagnosis not present

## 2015-07-22 DIAGNOSIS — G609 Hereditary and idiopathic neuropathy, unspecified: Secondary | ICD-10-CM | POA: Diagnosis not present

## 2015-08-12 DIAGNOSIS — M9902 Segmental and somatic dysfunction of thoracic region: Secondary | ICD-10-CM | POA: Diagnosis not present

## 2015-08-12 DIAGNOSIS — M5136 Other intervertebral disc degeneration, lumbar region: Secondary | ICD-10-CM | POA: Diagnosis not present

## 2015-08-12 DIAGNOSIS — M5030 Other cervical disc degeneration, unspecified cervical region: Secondary | ICD-10-CM | POA: Diagnosis not present

## 2015-08-12 DIAGNOSIS — M9903 Segmental and somatic dysfunction of lumbar region: Secondary | ICD-10-CM | POA: Diagnosis not present

## 2015-08-12 DIAGNOSIS — M9901 Segmental and somatic dysfunction of cervical region: Secondary | ICD-10-CM | POA: Diagnosis not present

## 2015-09-17 DIAGNOSIS — M25522 Pain in left elbow: Secondary | ICD-10-CM | POA: Diagnosis not present

## 2015-09-17 DIAGNOSIS — M7020 Olecranon bursitis, unspecified elbow: Secondary | ICD-10-CM | POA: Diagnosis not present

## 2015-09-27 ENCOUNTER — Telehealth: Payer: Self-pay | Admitting: Cardiovascular Disease

## 2015-09-27 DIAGNOSIS — M25522 Pain in left elbow: Secondary | ICD-10-CM | POA: Diagnosis not present

## 2015-09-27 DIAGNOSIS — M7022 Olecranon bursitis, left elbow: Secondary | ICD-10-CM | POA: Diagnosis not present

## 2015-09-27 NOTE — Telephone Encounter (Signed)
Returned call to [place] with Dr.Gramig's office.Stated patient scheduled to have shoulder surgery 09/29/15, wanted to ask Dr.Kelly if ok for patient to hold aspirin and plavix.Spoke to [facility] he advised patient needs to hold plavix 5 days prior to surgery.Advised to continue aspirin.

## 2015-10-04 DIAGNOSIS — M25722 Osteophyte, left elbow: Secondary | ICD-10-CM | POA: Diagnosis not present

## 2015-10-04 DIAGNOSIS — M7022 Olecranon bursitis, left elbow: Secondary | ICD-10-CM | POA: Diagnosis not present

## 2015-10-04 DIAGNOSIS — M24122 Other articular cartilage disorders, left elbow: Secondary | ICD-10-CM | POA: Diagnosis not present

## 2015-10-04 DIAGNOSIS — S0502XA Injury of conjunctiva and corneal abrasion without foreign body, left eye, initial encounter: Secondary | ICD-10-CM | POA: Diagnosis not present

## 2015-10-04 DIAGNOSIS — M65822 Other synovitis and tenosynovitis, left upper arm: Secondary | ICD-10-CM | POA: Diagnosis not present

## 2015-10-05 ENCOUNTER — Telehealth: Payer: Self-pay | Admitting: *Deleted

## 2015-10-05 NOTE — Telephone Encounter (Signed)
Opened in error

## 2015-10-10 ENCOUNTER — Other Ambulatory Visit: Payer: Self-pay | Admitting: *Deleted

## 2015-10-10 MED ORDER — NITROGLYCERIN 0.4 MG/SPRAY TL SOLN: 1.0000 | Status: DC | PRN

## 2015-10-19 DIAGNOSIS — G894 Chronic pain syndrome: Secondary | ICD-10-CM | POA: Diagnosis not present

## 2015-10-19 DIAGNOSIS — G609 Hereditary and idiopathic neuropathy, unspecified: Secondary | ICD-10-CM | POA: Diagnosis not present

## 2015-10-19 DIAGNOSIS — Z79891 Long term (current) use of opiate analgesic: Secondary | ICD-10-CM | POA: Diagnosis not present

## 2015-10-19 DIAGNOSIS — Z4789 Encounter for other orthopedic aftercare: Secondary | ICD-10-CM | POA: Diagnosis not present

## 2015-10-19 DIAGNOSIS — M25522 Pain in left elbow: Secondary | ICD-10-CM | POA: Diagnosis not present

## 2015-10-19 DIAGNOSIS — M7022 Olecranon bursitis, left elbow: Secondary | ICD-10-CM | POA: Diagnosis not present

## 2015-10-27 DIAGNOSIS — M7021 Olecranon bursitis, right elbow: Secondary | ICD-10-CM | POA: Diagnosis not present

## 2015-10-27 DIAGNOSIS — M7022 Olecranon bursitis, left elbow: Secondary | ICD-10-CM | POA: Diagnosis not present

## 2015-10-28 DIAGNOSIS — M9901 Segmental and somatic dysfunction of cervical region: Secondary | ICD-10-CM | POA: Diagnosis not present

## 2015-10-28 DIAGNOSIS — M9904 Segmental and somatic dysfunction of sacral region: Secondary | ICD-10-CM | POA: Diagnosis not present

## 2015-10-28 DIAGNOSIS — M5030 Other cervical disc degeneration, unspecified cervical region: Secondary | ICD-10-CM | POA: Diagnosis not present

## 2015-10-28 DIAGNOSIS — M5136 Other intervertebral disc degeneration, lumbar region: Secondary | ICD-10-CM | POA: Diagnosis not present

## 2015-10-28 DIAGNOSIS — M9903 Segmental and somatic dysfunction of lumbar region: Secondary | ICD-10-CM | POA: Diagnosis not present

## 2015-11-02 DIAGNOSIS — Z4789 Encounter for other orthopedic aftercare: Secondary | ICD-10-CM | POA: Diagnosis not present

## 2015-11-02 DIAGNOSIS — M25622 Stiffness of left elbow, not elsewhere classified: Secondary | ICD-10-CM | POA: Diagnosis not present

## 2015-11-09 DIAGNOSIS — I2510 Atherosclerotic heart disease of native coronary artery without angina pectoris: Secondary | ICD-10-CM | POA: Diagnosis not present

## 2015-11-09 DIAGNOSIS — Z79899 Other long term (current) drug therapy: Secondary | ICD-10-CM | POA: Diagnosis not present

## 2015-11-09 DIAGNOSIS — E782 Mixed hyperlipidemia: Secondary | ICD-10-CM | POA: Diagnosis not present

## 2015-11-09 DIAGNOSIS — Z951 Presence of aortocoronary bypass graft: Secondary | ICD-10-CM | POA: Diagnosis not present

## 2015-11-09 DIAGNOSIS — R5383 Other fatigue: Secondary | ICD-10-CM | POA: Diagnosis not present

## 2015-11-09 LAB — CBC
HEMATOCRIT: 40.8 % (ref 39.0–52.0)
Hemoglobin: 13.4 g/dL (ref 13.0–17.0)
MCH: 30.1 pg (ref 26.0–34.0)
MCHC: 32.8 g/dL (ref 30.0–36.0)
MCV: 91.7 fL (ref 78.0–100.0)
MPV: 10.5 fL (ref 8.6–12.4)
Platelets: 179 10*3/uL (ref 150–400)
RBC: 4.45 MIL/uL (ref 4.22–5.81)
RDW: 15.8 % — ABNORMAL HIGH (ref 11.5–15.5)
WBC: 3.1 10*3/uL — AB (ref 4.0–10.5)

## 2015-11-10 LAB — COMPREHENSIVE METABOLIC PANEL
ALBUMIN: 4.1 g/dL (ref 3.6–5.1)
ALK PHOS: 81 U/L (ref 40–115)
ALT: 26 U/L (ref 9–46)
AST: 36 U/L — AB (ref 10–35)
BILIRUBIN TOTAL: 1 mg/dL (ref 0.2–1.2)
BUN: 14 mg/dL (ref 7–25)
CALCIUM: 9.2 mg/dL (ref 8.6–10.3)
CO2: 27 mmol/L (ref 20–31)
Chloride: 105 mmol/L (ref 98–110)
Creat: 0.93 mg/dL (ref 0.70–1.25)
GLUCOSE: 123 mg/dL — AB (ref 65–99)
Potassium: 4.2 mmol/L (ref 3.5–5.3)
Sodium: 139 mmol/L (ref 135–146)
Total Protein: 6.3 g/dL (ref 6.1–8.1)

## 2015-11-10 LAB — C-REACTIVE PROTEIN: CRP: <0.5 mg/dL (ref <0.60)

## 2015-11-10 LAB — TSH: TSH: 1.43 mIU/L (ref 0.40–4.50)

## 2015-11-12 LAB — CARDIO IQ(R) ADVANCED LIPID PANEL
APOLIPOPROTEIN (CARDIO IQ ADV LIPID PANEL): 54 mg/dL (ref 52–109)
Cholesterol, Total: 127 mg/dL (ref 125–200)
Cholesterol/HDL Ratio: 2.3 calc (ref <=5.0)
HDL Cholesterol: 55 mg/dL (ref >=40)
LDL LARGE: 4657 nmol/L (ref 4334–10815)
LDL MEDIUM: 163 nmol/L — AB (ref 167–465)
LDL Particle Number: 856 nmol/L — ABNORMAL LOW (ref 1016–2185)
LDL Peak Size: 219.9 Angstrom (ref >=218.2)
LDL Small: 136 nmol/L (ref 123–441)
LDL, Calculated: 61 mg/dL
LIPOPROTEIN (A) (CARDIO IQ ADV LIPID PANEL): 66 nmol/L (ref <75)
Non-HDL Cholesterol: 72 mg/dL
Triglycerides: 57 mg/dL

## 2015-11-23 DIAGNOSIS — M25622 Stiffness of left elbow, not elsewhere classified: Secondary | ICD-10-CM | POA: Diagnosis not present

## 2015-11-25 ENCOUNTER — Telehealth: Payer: Self-pay | Admitting: *Deleted

## 2015-11-25 ENCOUNTER — Encounter: Payer: Self-pay | Admitting: *Deleted

## 2015-11-25 NOTE — Telephone Encounter (Signed)
PATIENT CALLED AND NOTIFIED OF LAB RESULTS.

## 2015-11-25 NOTE — Telephone Encounter (Signed)
-----   Message from Troy Sine, MD sent at 11/24/2015  3:33 PM EST ----- Labs good x glu 123; WBC low at 3.1

## 2015-11-30 DIAGNOSIS — Z4789 Encounter for other orthopedic aftercare: Secondary | ICD-10-CM | POA: Diagnosis not present

## 2015-12-02 DIAGNOSIS — M25512 Pain in left shoulder: Secondary | ICD-10-CM | POA: Diagnosis not present

## 2015-12-20 ENCOUNTER — Other Ambulatory Visit: Payer: Self-pay

## 2015-12-20 ENCOUNTER — Ambulatory Visit (HOSPITAL_COMMUNITY): Payer: Medicare Other | Attending: Cardiology

## 2015-12-20 DIAGNOSIS — I351 Nonrheumatic aortic (valve) insufficiency: Secondary | ICD-10-CM | POA: Insufficient documentation

## 2015-12-20 DIAGNOSIS — M9901 Segmental and somatic dysfunction of cervical region: Secondary | ICD-10-CM | POA: Diagnosis not present

## 2015-12-20 DIAGNOSIS — M9904 Segmental and somatic dysfunction of sacral region: Secondary | ICD-10-CM | POA: Diagnosis not present

## 2015-12-20 DIAGNOSIS — Z951 Presence of aortocoronary bypass graft: Secondary | ICD-10-CM

## 2015-12-20 DIAGNOSIS — I517 Cardiomegaly: Secondary | ICD-10-CM | POA: Diagnosis not present

## 2015-12-20 DIAGNOSIS — I2510 Atherosclerotic heart disease of native coronary artery without angina pectoris: Secondary | ICD-10-CM | POA: Diagnosis not present

## 2015-12-20 DIAGNOSIS — M5136 Other intervertebral disc degeneration, lumbar region: Secondary | ICD-10-CM | POA: Diagnosis not present

## 2015-12-20 DIAGNOSIS — M9903 Segmental and somatic dysfunction of lumbar region: Secondary | ICD-10-CM | POA: Diagnosis not present

## 2015-12-20 DIAGNOSIS — M5030 Other cervical disc degeneration, unspecified cervical region: Secondary | ICD-10-CM | POA: Diagnosis not present

## 2015-12-20 DIAGNOSIS — I77810 Thoracic aortic ectasia: Secondary | ICD-10-CM | POA: Insufficient documentation

## 2015-12-23 ENCOUNTER — Telehealth: Payer: Self-pay | Admitting: Cardiovascular Disease

## 2015-12-23 DIAGNOSIS — M9904 Segmental and somatic dysfunction of sacral region: Secondary | ICD-10-CM | POA: Diagnosis not present

## 2015-12-23 DIAGNOSIS — M19012 Primary osteoarthritis, left shoulder: Secondary | ICD-10-CM | POA: Diagnosis not present

## 2015-12-23 DIAGNOSIS — M5136 Other intervertebral disc degeneration, lumbar region: Secondary | ICD-10-CM | POA: Diagnosis not present

## 2015-12-23 DIAGNOSIS — M9901 Segmental and somatic dysfunction of cervical region: Secondary | ICD-10-CM | POA: Diagnosis not present

## 2015-12-23 DIAGNOSIS — M5030 Other cervical disc degeneration, unspecified cervical region: Secondary | ICD-10-CM | POA: Diagnosis not present

## 2015-12-23 DIAGNOSIS — M9903 Segmental and somatic dysfunction of lumbar region: Secondary | ICD-10-CM | POA: Diagnosis not present

## 2015-12-23 NOTE — Telephone Encounter (Signed)
New message      Pt c/o BP issue: STAT if pt c/o blurred vision, one-sided weakness or slurred speech  1. What are your last 5 BP readings? 110/60 2. Are you having any other symptoms (ex. Dizziness, headache, blurred vision, passed out)? Feels like he is not getting enough blood to his extremities 3. What is your BP issue? Pt feels like the lower number of his bp is too low.  What can he do to raise it?  Should he do something about his carvedilol?

## 2015-12-23 NOTE — Telephone Encounter (Signed)
Returned call to patient. He had 1 BP reading to report and thought 110/60 was low for him. He reports on further discussion that he does not take BPs daily and that he got this reading at a visit to his chiropractor's office. Chiropractor made suggestion that he may want to increase his salt intake. Pt not having any symptoms of low BP. He makes a complaint of tingling in his feet, but I told him this is probably not related. He denies dizziness on standing, fast HR, etc. Notes HgA1C checked recently and was fine. He is compliant w/ type II diabetes meds. I noted that at his request I'd be glad to forward to Dr. Claiborne Billings for any suggestions regarding tingling in extremities, but gave reassurance that the BP was normal. Advised to wait for any advice on diet changes, salt intake from cardiology or primary care.  Pt voiced thanks and understanding.

## 2016-01-03 DIAGNOSIS — E782 Mixed hyperlipidemia: Secondary | ICD-10-CM | POA: Diagnosis not present

## 2016-01-03 DIAGNOSIS — E559 Vitamin D deficiency, unspecified: Secondary | ICD-10-CM | POA: Diagnosis not present

## 2016-01-03 DIAGNOSIS — E119 Type 2 diabetes mellitus without complications: Secondary | ICD-10-CM | POA: Diagnosis not present

## 2016-01-05 DIAGNOSIS — G609 Hereditary and idiopathic neuropathy, unspecified: Secondary | ICD-10-CM | POA: Diagnosis not present

## 2016-01-05 DIAGNOSIS — E1159 Type 2 diabetes mellitus with other circulatory complications: Secondary | ICD-10-CM | POA: Diagnosis not present

## 2016-01-05 DIAGNOSIS — I10 Essential (primary) hypertension: Secondary | ICD-10-CM | POA: Diagnosis not present

## 2016-01-05 DIAGNOSIS — E782 Mixed hyperlipidemia: Secondary | ICD-10-CM | POA: Diagnosis not present

## 2016-01-05 DIAGNOSIS — E559 Vitamin D deficiency, unspecified: Secondary | ICD-10-CM | POA: Diagnosis not present

## 2016-01-05 DIAGNOSIS — I2510 Atherosclerotic heart disease of native coronary artery without angina pectoris: Secondary | ICD-10-CM | POA: Diagnosis not present

## 2016-01-11 DIAGNOSIS — Z79891 Long term (current) use of opiate analgesic: Secondary | ICD-10-CM | POA: Diagnosis not present

## 2016-01-11 DIAGNOSIS — Z5181 Encounter for therapeutic drug level monitoring: Secondary | ICD-10-CM | POA: Diagnosis not present

## 2016-01-11 DIAGNOSIS — G894 Chronic pain syndrome: Secondary | ICD-10-CM | POA: Diagnosis not present

## 2016-01-11 DIAGNOSIS — Z79899 Other long term (current) drug therapy: Secondary | ICD-10-CM | POA: Diagnosis not present

## 2016-01-11 DIAGNOSIS — G609 Hereditary and idiopathic neuropathy, unspecified: Secondary | ICD-10-CM | POA: Diagnosis not present

## 2016-01-13 DIAGNOSIS — M5136 Other intervertebral disc degeneration, lumbar region: Secondary | ICD-10-CM | POA: Diagnosis not present

## 2016-01-13 DIAGNOSIS — M9903 Segmental and somatic dysfunction of lumbar region: Secondary | ICD-10-CM | POA: Diagnosis not present

## 2016-01-13 DIAGNOSIS — M5030 Other cervical disc degeneration, unspecified cervical region: Secondary | ICD-10-CM | POA: Diagnosis not present

## 2016-01-13 DIAGNOSIS — M9904 Segmental and somatic dysfunction of sacral region: Secondary | ICD-10-CM | POA: Diagnosis not present

## 2016-01-13 DIAGNOSIS — M9901 Segmental and somatic dysfunction of cervical region: Secondary | ICD-10-CM | POA: Diagnosis not present

## 2016-01-19 DIAGNOSIS — M216X2 Other acquired deformities of left foot: Secondary | ICD-10-CM | POA: Diagnosis not present

## 2016-01-19 DIAGNOSIS — E1351 Other specified diabetes mellitus with diabetic peripheral angiopathy without gangrene: Secondary | ICD-10-CM | POA: Diagnosis not present

## 2016-01-19 DIAGNOSIS — M79671 Pain in right foot: Secondary | ICD-10-CM | POA: Diagnosis not present

## 2016-01-19 DIAGNOSIS — M216X1 Other acquired deformities of right foot: Secondary | ICD-10-CM | POA: Diagnosis not present

## 2016-02-02 DIAGNOSIS — Z0000 Encounter for general adult medical examination without abnormal findings: Secondary | ICD-10-CM | POA: Diagnosis not present

## 2016-02-02 DIAGNOSIS — Z125 Encounter for screening for malignant neoplasm of prostate: Secondary | ICD-10-CM | POA: Diagnosis not present

## 2016-02-02 DIAGNOSIS — E782 Mixed hyperlipidemia: Secondary | ICD-10-CM | POA: Diagnosis not present

## 2016-02-02 DIAGNOSIS — I10 Essential (primary) hypertension: Secondary | ICD-10-CM | POA: Diagnosis not present

## 2016-02-02 DIAGNOSIS — G629 Polyneuropathy, unspecified: Secondary | ICD-10-CM | POA: Diagnosis not present

## 2016-02-02 DIAGNOSIS — Z7984 Long term (current) use of oral hypoglycemic drugs: Secondary | ICD-10-CM | POA: Diagnosis not present

## 2016-02-02 DIAGNOSIS — Z79899 Other long term (current) drug therapy: Secondary | ICD-10-CM | POA: Diagnosis not present

## 2016-02-02 DIAGNOSIS — E1142 Type 2 diabetes mellitus with diabetic polyneuropathy: Secondary | ICD-10-CM | POA: Diagnosis not present

## 2016-02-02 DIAGNOSIS — I2510 Atherosclerotic heart disease of native coronary artery without angina pectoris: Secondary | ICD-10-CM | POA: Diagnosis not present

## 2016-02-08 DIAGNOSIS — M9904 Segmental and somatic dysfunction of sacral region: Secondary | ICD-10-CM | POA: Diagnosis not present

## 2016-02-08 DIAGNOSIS — M5030 Other cervical disc degeneration, unspecified cervical region: Secondary | ICD-10-CM | POA: Diagnosis not present

## 2016-02-08 DIAGNOSIS — M9901 Segmental and somatic dysfunction of cervical region: Secondary | ICD-10-CM | POA: Diagnosis not present

## 2016-02-08 DIAGNOSIS — M9903 Segmental and somatic dysfunction of lumbar region: Secondary | ICD-10-CM | POA: Diagnosis not present

## 2016-02-08 DIAGNOSIS — M5136 Other intervertebral disc degeneration, lumbar region: Secondary | ICD-10-CM | POA: Diagnosis not present

## 2016-02-15 NOTE — Telephone Encounter (Signed)
Would refer to endocrinology for tingling.  BP looks good, especially in light of no hypotensive symptoms.   Be sure to stay hydrated

## 2016-02-15 NOTE — Telephone Encounter (Signed)
Left msg to call.

## 2016-04-04 DIAGNOSIS — G609 Hereditary and idiopathic neuropathy, unspecified: Secondary | ICD-10-CM | POA: Diagnosis not present

## 2016-04-04 DIAGNOSIS — Z79891 Long term (current) use of opiate analgesic: Secondary | ICD-10-CM | POA: Diagnosis not present

## 2016-04-04 DIAGNOSIS — G894 Chronic pain syndrome: Secondary | ICD-10-CM | POA: Diagnosis not present

## 2016-05-18 DIAGNOSIS — M5030 Other cervical disc degeneration, unspecified cervical region: Secondary | ICD-10-CM | POA: Diagnosis not present

## 2016-05-18 DIAGNOSIS — M9901 Segmental and somatic dysfunction of cervical region: Secondary | ICD-10-CM | POA: Diagnosis not present

## 2016-05-18 DIAGNOSIS — M5136 Other intervertebral disc degeneration, lumbar region: Secondary | ICD-10-CM | POA: Diagnosis not present

## 2016-05-18 DIAGNOSIS — M9903 Segmental and somatic dysfunction of lumbar region: Secondary | ICD-10-CM | POA: Diagnosis not present

## 2016-05-18 DIAGNOSIS — M9902 Segmental and somatic dysfunction of thoracic region: Secondary | ICD-10-CM | POA: Diagnosis not present

## 2016-05-25 DIAGNOSIS — H524 Presbyopia: Secondary | ICD-10-CM | POA: Diagnosis not present

## 2016-05-25 DIAGNOSIS — H2513 Age-related nuclear cataract, bilateral: Secondary | ICD-10-CM | POA: Diagnosis not present

## 2016-05-25 DIAGNOSIS — H25013 Cortical age-related cataract, bilateral: Secondary | ICD-10-CM | POA: Diagnosis not present

## 2016-05-25 DIAGNOSIS — E119 Type 2 diabetes mellitus without complications: Secondary | ICD-10-CM | POA: Diagnosis not present

## 2016-06-01 DIAGNOSIS — M5030 Other cervical disc degeneration, unspecified cervical region: Secondary | ICD-10-CM | POA: Diagnosis not present

## 2016-06-01 DIAGNOSIS — M9901 Segmental and somatic dysfunction of cervical region: Secondary | ICD-10-CM | POA: Diagnosis not present

## 2016-06-01 DIAGNOSIS — M5136 Other intervertebral disc degeneration, lumbar region: Secondary | ICD-10-CM | POA: Diagnosis not present

## 2016-06-01 DIAGNOSIS — M9903 Segmental and somatic dysfunction of lumbar region: Secondary | ICD-10-CM | POA: Diagnosis not present

## 2016-06-01 DIAGNOSIS — M9902 Segmental and somatic dysfunction of thoracic region: Secondary | ICD-10-CM | POA: Diagnosis not present

## 2016-06-18 DIAGNOSIS — Z79891 Long term (current) use of opiate analgesic: Secondary | ICD-10-CM | POA: Diagnosis not present

## 2016-06-18 DIAGNOSIS — G609 Hereditary and idiopathic neuropathy, unspecified: Secondary | ICD-10-CM | POA: Diagnosis not present

## 2016-06-18 DIAGNOSIS — G894 Chronic pain syndrome: Secondary | ICD-10-CM | POA: Diagnosis not present

## 2016-07-09 DIAGNOSIS — Z23 Encounter for immunization: Secondary | ICD-10-CM | POA: Diagnosis not present

## 2016-07-09 DIAGNOSIS — E1159 Type 2 diabetes mellitus with other circulatory complications: Secondary | ICD-10-CM | POA: Diagnosis not present

## 2016-07-14 ENCOUNTER — Other Ambulatory Visit: Payer: Self-pay | Admitting: Cardiovascular Disease

## 2016-07-17 DIAGNOSIS — M216X1 Other acquired deformities of right foot: Secondary | ICD-10-CM | POA: Diagnosis not present

## 2016-07-17 DIAGNOSIS — E1351 Other specified diabetes mellitus with diabetic peripheral angiopathy without gangrene: Secondary | ICD-10-CM | POA: Diagnosis not present

## 2016-07-17 DIAGNOSIS — M216X2 Other acquired deformities of left foot: Secondary | ICD-10-CM | POA: Diagnosis not present

## 2016-07-17 DIAGNOSIS — D2372 Other benign neoplasm of skin of left lower limb, including hip: Secondary | ICD-10-CM | POA: Diagnosis not present

## 2016-07-19 ENCOUNTER — Other Ambulatory Visit: Payer: Self-pay

## 2016-07-19 MED ORDER — OMEGA-3-ACID ETHYL ESTERS 1 G PO CAPS: 2.0000 | ORAL_CAPSULE | Freq: Every day | ORAL | 0 refills | Status: DC

## 2016-07-19 MED ORDER — NIACIN ER (ANTIHYPERLIPIDEMIC) 1000 MG PO TBCR: 1000.0000 mg | EXTENDED_RELEASE_TABLET | Freq: Every day | ORAL | 0 refills | Status: DC

## 2016-07-30 ENCOUNTER — Other Ambulatory Visit: Payer: Self-pay | Admitting: Cardiovascular Disease

## 2016-07-30 NOTE — Telephone Encounter (Signed)
Rx request sent to pharmacy.  

## 2016-07-31 ENCOUNTER — Encounter: Payer: Self-pay | Admitting: Cardiovascular Disease

## 2016-07-31 ENCOUNTER — Ambulatory Visit (INDEPENDENT_AMBULATORY_CARE_PROVIDER_SITE_OTHER): Payer: Medicare Other | Admitting: Cardiovascular Disease

## 2016-07-31 VITALS — BP 107/59 | HR 55 | Ht 71.0 in | Wt 196.4 lb

## 2016-07-31 DIAGNOSIS — E1140 Type 2 diabetes mellitus with diabetic neuropathy, unspecified: Secondary | ICD-10-CM | POA: Diagnosis not present

## 2016-07-31 DIAGNOSIS — I2510 Atherosclerotic heart disease of native coronary artery without angina pectoris: Secondary | ICD-10-CM | POA: Diagnosis not present

## 2016-07-31 DIAGNOSIS — Z951 Presence of aortocoronary bypass graft: Secondary | ICD-10-CM | POA: Diagnosis not present

## 2016-07-31 DIAGNOSIS — E782 Mixed hyperlipidemia: Secondary | ICD-10-CM | POA: Diagnosis not present

## 2016-07-31 DIAGNOSIS — G629 Polyneuropathy, unspecified: Secondary | ICD-10-CM

## 2016-07-31 MED ORDER — OMEGA-3-ACID ETHYL ESTERS 1 G PO CAPS: 2.0000 | ORAL_CAPSULE | Freq: Every day | ORAL | 3 refills | Status: DC

## 2016-07-31 MED ORDER — RANOLAZINE ER 1000 MG PO TB12: ORAL_TABLET | ORAL | 3 refills | Status: DC

## 2016-07-31 MED ORDER — ISOSORBIDE MONONITRATE ER 30 MG PO TB24: 30.0000 mg | ORAL_TABLET | Freq: Every day | ORAL | 3 refills | Status: DC

## 2016-07-31 MED ORDER — CARVEDILOL 6.25 MG PO TABS: 6.2500 mg | ORAL_TABLET | Freq: Two times a day (BID) | ORAL | 3 refills | Status: DC

## 2016-07-31 MED ORDER — CLOPIDOGREL BISULFATE 75 MG PO TABS: 75.0000 mg | ORAL_TABLET | Freq: Every day | ORAL | 3 refills | Status: DC

## 2016-07-31 MED ORDER — ROSUVASTATIN CALCIUM 40 MG PO TABS: 40.0000 mg | ORAL_TABLET | Freq: Every day | ORAL | 3 refills | Status: DC

## 2016-07-31 MED ORDER — NIACIN ER (ANTIHYPERLIPIDEMIC) 1000 MG PO TBCR: 1000.0000 mg | EXTENDED_RELEASE_TABLET | Freq: Every day | ORAL | 3 refills | Status: DC

## 2016-07-31 MED ORDER — RAMIPRIL 10 MG PO CAPS: 10.0000 mg | ORAL_CAPSULE | Freq: Every day | ORAL | 3 refills | Status: DC

## 2016-07-31 NOTE — Patient Instructions (Signed)
Your physician wants you to follow-up in: 1 YEAR OR SOONER IF NEEDED. You will receive a reminder letter in the mail two months in advance. If you don't receive a letter, please call our office to schedule the follow-up appointment. 

## 2016-08-02 MED ORDER — NITROGLYCERIN 0.4 MG/SPRAY TL SOLN: 1.0000 | 6 refills | Status: DC | PRN

## 2016-08-02 NOTE — Progress Notes (Signed)
Patient ID: Ruben [name], male   DOB: 26-Sep-1945, 70 y.o.   MRN: [id number]    PCP: Dr. Dagmar Hait  HPI: Ruben [name] is a 70 y.o. male who presents for a one year cardiology followup evaluation.   Mr. Hosking has established CAD and in 1997 underwent CABG surgery with a LIMA to the LAD, vein to the diagonal, vein to the obtuse marginal, and vein to the right coronary artery. In November 2004 he underwent stenting to the vein graft to the circumflex vessel. In December 2009 the graft to the circumflex was occluded and he underwent stenting of the left main into the superior ramus intermediate vessel. At that time he also underwent stenting of the vein graft to the right coronary artery. His last catheterization which was done for repeat chest pain in March 2013 showed patent stents. He has known occlusion of the graft that supplied the circumflex marginal vessel. The graft to diagonal vessel was patent and filled the proximal LAD system and the LIMA graft which filled the mid LAD system was widely patent.  He has ahistory of mixed hyperlipidemia and has been on aggressive lipid-lowering therapy over the past 18 years. He also has developed a peripheral neuropathy. He has mild diabetes mellitus and is now followed by [name]. In April 2014 which labs showed a total cholesterol was 125, triglycerides 52, HDL 55, LDL 52 on aggressive combination therapy consisting of Niaspan 2 g daily, Crestor 40 mg daily, Lovaza 2 g daily.  Initially, we had followed him with [facility] heart lab assessments.  On 12/08/2013 a followup nuclear perfusion study continued to show normal perfusion without scar or ischemia on his current medical therapy.  Ejection fraction was 64%.  He is retired for several years and has more time.  He exercises regularly and walks up to 5 miles per day approximately 4-5 days per week and does yoga 3 days per week.  He denies any recurrent anginal symptoms.  He denies any shortness of breath.   He denies PND, orthopnea.  He denies palpitations.   Laboratory in May 2015 showed significant aggressive lipid management with an LDL particle number now at 371 and calculated LDL 43, HDL 57, triglycerides 37, total cholesterol 107.  Small LDL particles of 156.  He still had an increased VLDL size.  Insulin resistance score was less than 25.  At that time, I recommended he reduce his Niaspan from 2000 mg to 1000 mg a discussed the possibility of discontinuing this altogether in the future depending upon subsequent levels.  In March 2017 he underwent a repeat echo Doppler study which showed mild LVH with normal systolic function without wall motion abnormalities.  There was very mild aortic regurgitation.  His aortic root dimension measured 39 mm.  His left atrium was mildly dilated.  Pulmonary pressures were normal.  He also underwent a repeat lipoprofile which showed an LDL particle #856.  He had 136 small LDL particles was normal.  Total cholesterol was 127, HDL 55, triglycerides 57, and LDL 61.  April lipoprotein B was 54 and LPa was 66, all excellent.  Over the past year he has continued to remain stable.  He denies any anginal symptoms.  He denies any dizziness or palpitations.  He denies presyncope or syncope.  He has a peripheral neuropathy which is stable on  Lyrica.  He is on Onglyza and Actos for diabetes mellitus.  He continues to be on dual platelet therapy and denies bleeding.  He  denies PND, orthopnea.  He presents for evaluation.   Past Surgical History:  Procedure Laterality Date  . CARDIAC CATHETERIZATION  12/13/2011   No intervention - recommend medical therapy with increased medical trial  . CARDIOVASCULAR STRESS TEST  10/26/2010   No scintigraphic evidence of inducible myocardial ischemia. No ECG changes. EKG negative for ischemia.  . CORONARY ARTERY BYPASS GRAFT  1997   LIMA-LAD, vein graft to diagonal, vein graft to obtuse marginal, and vein graft to RCA  . LEFT HEART  CATHETERIZATION WITH CORONARY/GRAFT ANGIOGRAM N/A 12/13/2011   Procedure: LEFT HEART CATHETERIZATION WITH Beatrix Fetters;  Surgeon: Troy Sine, MD;  Location: [facility] CATH LAB;  Service: Cardiovascular;  Laterality: N/A;  . LOWER EXTREMITY ARTERIAL DOPPLER  01/30/2000   Normal arterial study  . TRANSTHORACIC ECHOCARDIOGRAM  12/12/2011   EF >55%, LA severely dilated.    Allergies  Allergen Reactions  . Cymbalta [Duloxetine Hcl] Diarrhea    Current Outpatient Prescriptions  Medication Sig Dispense Refill  . aspirin EC 81 MG tablet Take 81 mg by mouth at bedtime.    . carvedilol (COREG) 6.25 MG tablet Take 1 tablet (6.25 mg total) by mouth 2 (two) times daily with a meal. 180 tablet 3  . clopidogrel (PLAVIX) 75 MG tablet Take 1 tablet (75 mg total) by mouth daily. 90 tablet 3  . isosorbide mononitrate (IMDUR) 30 MG 24 hr tablet Take 1 tablet (30 mg total) by mouth daily. 90 tablet 3  . L-Methylfolate-B6-B12 (METANX PO) Take by mouth 2 (two) times daily.    . niacin (NIASPAN) 1000 MG CR tablet Take 1 tablet (1,000 mg total) by mouth at bedtime. 90 tablet 3  . nitroGLYCERIN (NITROLINGUAL) 0.4 MG/SPRAY spray Place 1 spray under the tongue every 5 (five) minutes x 3 doses as needed for chest pain. 12 g 6  . omega-3 acid ethyl esters (LOVAZA) 1 g capsule Take 2 capsules (2 g total) by mouth daily. 90 capsule 3  . oxyCODONE-acetaminophen (PERCOCET) 7.5-325 MG per tablet Take 1 tablet by mouth daily as needed for pain.    . pioglitazone (ACTOS) 45 MG tablet Take 45 mg by mouth daily.    . pregabalin (LYRICA) 150 MG capsule Take 150 mg by mouth 4 (four) times daily.    . ramipril (ALTACE) 10 MG capsule Take 1 capsule (10 mg total) by mouth daily. 90 capsule 3  . ranolazine (RANEXA) 1000 MG SR tablet TAKE 1 TABLET(1000 MG) BY MOUTH TWICE DAILY 180 tablet 3  . rosuvastatin (CRESTOR) 40 MG tablet Take 1 tablet (40 mg total) by mouth daily. 90 tablet 3  . saxagliptin HCl (ONGLYZA) 5 MG TABS tablet  Take 5 mg by mouth daily.    . Tapentadol HCl 150 MG TB12 Take 150 mg by mouth every 12 (twelve) hours.     No current facility-administered medications for this visit.     Social History   Social History  . Marital status: Single    Spouse name: N/A  . Number of children: N/A  . Years of education: N/A   Occupational History  . Not on file.   Social History Main Topics  . Smoking status: Never Smoker  . Smokeless tobacco: Never Used  . Alcohol use Yes  . Drug use:   . Sexual activity: Not on file   Other Topics Concern  . Not on file   Social History Narrative  . No narrative on file    Family History  Problem Relation Age of  Onset  . Heart disease Father   . Diabetes Father   . Hyperlipidemia Sister    Socially he is married. His former wife  was Dory Larsen, an iins[employer] agent. He has been married for several years with his present wife and has 2 stepchildren with her. He does exercise least 4-5 days per week. He has now retired. Previously he was an Optometrist for [employer].   ROS General: Negative; No fevers, chills, or night sweats;  HEENT: Negative; No changes in vision or hearing, sinus congestion, difficulty swallowing Pulmonary: Negative; No cough, wheezing, shortness of breath, hemoptysis Cardiovascular: Negative; No chest pain, presyncope, syncope, palpitations GI: Negative; No nausea, vomiting, diarrhea, or abdominal pain GU: Negative; No dysuria, hematuria, or difficulty voiding Musculoskeletal: Negative; no myalgias, joint pain, or weakness Hematologic/Oncology: Negative; no easy bruising, bleeding Endocrine: Positive for diabetes mellitus, type II; no heat/cold intolerance;  Neuro: Positive for peripheral neuropathy; no changes in balance, headaches Skin: Negative; No rashes or skin lesions Psychiatric: Negative; No behavioral problems, depression Sleep: Negative; No snoring, daytime sleepiness, hypersomnolence, bruxism, restless legs,  hypnogognic hallucinations, no cataplexy Other comprehensive 14 point system review is negative.    PE BP (!) 107/59   Pulse (!) 55   Ht 5' 11"  (1.803 m)   Wt 196 lb 6.4 oz (89.1 kg)   BMI 27.39 kg/m    Repeat blood pressure by me 122/66 supine and 120/60 standing  Wt Readings from Last 3 Encounters:  07/31/16 196 lb 6.4 oz (89.1 kg)  06/24/15 203 lb 11.2 oz (92.4 kg)  06/22/14 193 lb 11.2 oz (87.9 kg)   General: Alert, oriented, no distress.  Skin: normal turgor, no rashes HEENT: Normocephalic, atraumatic. Pupils round and reactive; sclera anicteric;no lid lag.  Nose without nasal septal hypertrophy Mouth/Parynx benign; Mallinpatti scale 2 Neck: No JVD, no carotid bruits with normal carotid upstroke Lungs: clear to ausculatation and percussion; no wheezing or rales Chest wall: Nontender to palpation Heart: RRR, s1 s2 normal 1/6 systolic murmur.  No diastolic murmur.  No rubs, thrills or heaves Abdomen: soft, nontender; no hepatosplenomehaly, BS+; abdominal aorta nontender and not dilated by palpation. Back: No CVA tenderness Pulses 2+ Extremities: no clubbing cyanosis or edema, Homan's sign negative  Neurologic: grossly nonfocal Psychologic: normal affect and mood.  ECG (independently read by me): Sinus bradycardia at 55 bpm with first-degree AV block.  PR interval 264 ms.  Nonspecific T changes inferiorly.  ECG (independently read by me): Sinus bradycardia at 59 bpm.  First degree AV block with a PR interval at 23 ms.  Incomplete right bundle branch block.  No significant ST segment changes.  September 2015 ECG (independently read by me): Sinus bradycardia 55 beats per minute.  First degree AV block with PR interval 258 ms.  Mild RV conduction delay.  Nonspecific T changes.  Prior 07/13/2013 ECG: Sinus bradycardia 54 beats per minute with first-degree AV block. Nonspecific T changes.  LABS: BMP Latest Ref Rng & Units 11/09/2015 09/29/2014 02/12/2014  Glucose 65 - 99 mg/dL  123(H) 105(H) 125(H)  BUN 7 - 25 mg/dL 14 15 13   Creatinine 0.70 - 1.25 mg/dL 0.93 0.84 0.92  Sodium 135 - 146 mmol/L 139 138 138  Potassium 3.5 - 5.3 mmol/L 4.2 4.5 4.3  Chloride 98 - 110 mmol/L 105 102 105  CO2 20 - 31 mmol/L 27 28 27   Calcium 8.6 - 10.3 mg/dL 9.2 9.1 8.8   Hepatic Function Latest Ref Rng & Units 11/09/2015 09/29/2014 02/12/2014  Total  Protein 6.1 - 8.1 g/dL 6.3 6.4 6.1  Albumin 3.6 - 5.1 g/dL 4.1 4.3 4.1  AST 10 - 35 U/L 36(H) 22 29  ALT 9 - 46 U/L 26 18 20   Alk Phosphatase 40 - 115 U/L 81 89 88  Total Bilirubin 0.2 - 1.2 mg/dL 1.0 1.0 1.0   CBC Latest Ref Rng & Units 11/09/2015 02/12/2014 08/27/2008  WBC 4.0 - 10.5 K/uL 3.1(L) 2.9(L) 8.1  Hemoglobin 13.0 - 17.0 g/dL 13.4 13.5 12.9(L)  Hematocrit 39.0 - 52.0 % 40.8 39.5 37.8(L)  Platelets 150 - 400 K/uL 179 154 138(L)   Lab Results  Component Value Date   MCV 91.7 11/09/2015   MCV 89.2 02/12/2014   MCV 90.9 08/27/2008   Lab Results  Component Value Date   TSH 1.43 11/09/2015  No results found for: HGBA1C  Lipid Panel     Component Value Date/Time   CHOL 127 11/09/2015 0854   CHOL 107 02/12/2014 0801   TRIG 57 11/09/2015 0854   TRIG 37 02/12/2014 0801   HDL 55 11/09/2015 0854   HDL 57 02/12/2014 0801   CHOLHDL 2.3 11/09/2015 0854   CHOLHDL 2.0 09/29/2014 0853   VLDL 10 09/29/2014 0853   LDLCALC 61 11/09/2015 0854   LDLCALC 43 02/12/2014 0801    RADIOLOGY: No results found.    ASSESSMENT AND PLAN: Mr. Kenwood Rosiak is a 70 year old white male who underwent CABG revascularization surgery in 1997; 13 years status post initial stenting of the graft to the circumflex vessel which subsequently did close, and 8 years s/p stenting of his left main into the superior ramus branch and stenting to the vein graft supplying the RCA.  At last catheterization  the stent to the left main/ramus vessel was widely patent as was the stent to the vein graft to the right coronary artery. His proximal LAD is being supplied  by his diagonal vessel in his mid LAD is being supplied by the LIMA graft.  His blood pressure is well-controlled today at he is without orthostatic symptoms on on carvedilol 6.25 twice a day, ramipril, 10 mg daily.  He is not having any anginal symptoms and continues to take isosorbide mononitrate 30 mg in addition to ranolazine 1000 mg twice a day.  His diabetes is well controlled and is followed by by Dr. Jacqlyn Krauss.  Recent hemoglobin A1c was was 5.5 on his current dose of Actos as well as Onglyza.   His lipids have been consistently excellent over the past several years on aggressive combination therapy.  Initially, he had very low HDL levels in the 20s and had significant increase in small LDL particles.  Viewed his most recent advance lipid testing and his results.  Continue to be excellent on his current therapy consisting of Crestor 40 mg, niacin 1000 mg and omega-3 fatty acid lovaza 2 capsules daily.  His peripheral neuropathy is controlled with Lyrica.  He continues to exercise on a daily basis.  He will continue his current medical regimen.  I will see him in one year for reevaluation.   Time spent: 25 minutes  Troy Sine, MD, [facility]  08/02/2016 7:42 PM

## 2016-08-06 ENCOUNTER — Other Ambulatory Visit: Payer: Self-pay | Admitting: *Deleted

## 2016-08-06 MED ORDER — NITROGLYCERIN 0.4 MG/SPRAY TL SOLN: 1.0000 | 11 refills | Status: DC | PRN

## 2016-08-06 NOTE — Telephone Encounter (Signed)
Patient called and stated that the pharmacy did not receive the rx for nitro spray.

## 2016-08-06 NOTE — Telephone Encounter (Signed)
REFILL 

## 2016-08-27 DIAGNOSIS — G894 Chronic pain syndrome: Secondary | ICD-10-CM | POA: Diagnosis not present

## 2016-08-27 DIAGNOSIS — Z79891 Long term (current) use of opiate analgesic: Secondary | ICD-10-CM | POA: Diagnosis not present

## 2016-08-27 DIAGNOSIS — G609 Hereditary and idiopathic neuropathy, unspecified: Secondary | ICD-10-CM | POA: Diagnosis not present

## 2016-09-05 DIAGNOSIS — M9901 Segmental and somatic dysfunction of cervical region: Secondary | ICD-10-CM | POA: Diagnosis not present

## 2016-09-05 DIAGNOSIS — M5136 Other intervertebral disc degeneration, lumbar region: Secondary | ICD-10-CM | POA: Diagnosis not present

## 2016-09-05 DIAGNOSIS — M9902 Segmental and somatic dysfunction of thoracic region: Secondary | ICD-10-CM | POA: Diagnosis not present

## 2016-09-05 DIAGNOSIS — M9904 Segmental and somatic dysfunction of sacral region: Secondary | ICD-10-CM | POA: Diagnosis not present

## 2016-09-05 DIAGNOSIS — M9903 Segmental and somatic dysfunction of lumbar region: Secondary | ICD-10-CM | POA: Diagnosis not present

## 2016-09-19 DIAGNOSIS — E1351 Other specified diabetes mellitus with diabetic peripheral angiopathy without gangrene: Secondary | ICD-10-CM | POA: Diagnosis not present

## 2016-09-19 DIAGNOSIS — D2372 Other benign neoplasm of skin of left lower limb, including hip: Secondary | ICD-10-CM | POA: Diagnosis not present

## 2016-09-19 DIAGNOSIS — L820 Inflamed seborrheic keratosis: Secondary | ICD-10-CM | POA: Diagnosis not present

## 2016-09-19 DIAGNOSIS — M216X2 Other acquired deformities of left foot: Secondary | ICD-10-CM | POA: Diagnosis not present

## 2016-09-19 DIAGNOSIS — M216X1 Other acquired deformities of right foot: Secondary | ICD-10-CM | POA: Diagnosis not present

## 2016-10-15 DIAGNOSIS — D1801 Hemangioma of skin and subcutaneous tissue: Secondary | ICD-10-CM | POA: Diagnosis not present

## 2016-10-15 DIAGNOSIS — D2272 Melanocytic nevi of left lower limb, including hip: Secondary | ICD-10-CM | POA: Diagnosis not present

## 2016-10-15 DIAGNOSIS — D485 Neoplasm of uncertain behavior of skin: Secondary | ICD-10-CM | POA: Diagnosis not present

## 2016-10-15 DIAGNOSIS — L821 Other seborrheic keratosis: Secondary | ICD-10-CM | POA: Diagnosis not present

## 2016-10-15 DIAGNOSIS — D235 Other benign neoplasm of skin of trunk: Secondary | ICD-10-CM | POA: Diagnosis not present

## 2016-11-14 DIAGNOSIS — M5136 Other intervertebral disc degeneration, lumbar region: Secondary | ICD-10-CM | POA: Diagnosis not present

## 2016-11-14 DIAGNOSIS — M9902 Segmental and somatic dysfunction of thoracic region: Secondary | ICD-10-CM | POA: Diagnosis not present

## 2016-11-14 DIAGNOSIS — M9903 Segmental and somatic dysfunction of lumbar region: Secondary | ICD-10-CM | POA: Diagnosis not present

## 2016-11-14 DIAGNOSIS — M5030 Other cervical disc degeneration, unspecified cervical region: Secondary | ICD-10-CM | POA: Diagnosis not present

## 2016-11-14 DIAGNOSIS — M9901 Segmental and somatic dysfunction of cervical region: Secondary | ICD-10-CM | POA: Diagnosis not present

## 2016-11-19 DIAGNOSIS — Z79891 Long term (current) use of opiate analgesic: Secondary | ICD-10-CM | POA: Diagnosis not present

## 2016-11-19 DIAGNOSIS — G894 Chronic pain syndrome: Secondary | ICD-10-CM | POA: Diagnosis not present

## 2016-11-19 DIAGNOSIS — G609 Hereditary and idiopathic neuropathy, unspecified: Secondary | ICD-10-CM | POA: Diagnosis not present

## 2016-11-26 DIAGNOSIS — E1159 Type 2 diabetes mellitus with other circulatory complications: Secondary | ICD-10-CM | POA: Diagnosis not present

## 2016-11-26 DIAGNOSIS — E559 Vitamin D deficiency, unspecified: Secondary | ICD-10-CM | POA: Diagnosis not present

## 2016-11-29 DIAGNOSIS — Z7984 Long term (current) use of oral hypoglycemic drugs: Secondary | ICD-10-CM | POA: Diagnosis not present

## 2016-11-29 DIAGNOSIS — I2510 Atherosclerotic heart disease of native coronary artery without angina pectoris: Secondary | ICD-10-CM | POA: Diagnosis not present

## 2016-11-29 DIAGNOSIS — G609 Hereditary and idiopathic neuropathy, unspecified: Secondary | ICD-10-CM | POA: Diagnosis not present

## 2016-11-29 DIAGNOSIS — E559 Vitamin D deficiency, unspecified: Secondary | ICD-10-CM | POA: Diagnosis not present

## 2016-11-29 DIAGNOSIS — I10 Essential (primary) hypertension: Secondary | ICD-10-CM | POA: Diagnosis not present

## 2016-11-29 DIAGNOSIS — E782 Mixed hyperlipidemia: Secondary | ICD-10-CM | POA: Diagnosis not present

## 2016-11-29 DIAGNOSIS — E1159 Type 2 diabetes mellitus with other circulatory complications: Secondary | ICD-10-CM | POA: Diagnosis not present

## 2016-12-01 ENCOUNTER — Other Ambulatory Visit: Payer: Self-pay | Admitting: Cardiovascular Disease

## 2016-12-17 DIAGNOSIS — M5030 Other cervical disc degeneration, unspecified cervical region: Secondary | ICD-10-CM | POA: Diagnosis not present

## 2016-12-17 DIAGNOSIS — M9901 Segmental and somatic dysfunction of cervical region: Secondary | ICD-10-CM | POA: Diagnosis not present

## 2016-12-17 DIAGNOSIS — M9902 Segmental and somatic dysfunction of thoracic region: Secondary | ICD-10-CM | POA: Diagnosis not present

## 2016-12-17 DIAGNOSIS — M9903 Segmental and somatic dysfunction of lumbar region: Secondary | ICD-10-CM | POA: Diagnosis not present

## 2016-12-17 DIAGNOSIS — M5136 Other intervertebral disc degeneration, lumbar region: Secondary | ICD-10-CM | POA: Diagnosis not present

## 2016-12-18 DIAGNOSIS — B029 Zoster without complications: Secondary | ICD-10-CM | POA: Diagnosis not present

## 2017-01-01 DIAGNOSIS — M9902 Segmental and somatic dysfunction of thoracic region: Secondary | ICD-10-CM | POA: Diagnosis not present

## 2017-01-01 DIAGNOSIS — M5136 Other intervertebral disc degeneration, lumbar region: Secondary | ICD-10-CM | POA: Diagnosis not present

## 2017-01-01 DIAGNOSIS — M9903 Segmental and somatic dysfunction of lumbar region: Secondary | ICD-10-CM | POA: Diagnosis not present

## 2017-01-01 DIAGNOSIS — M9901 Segmental and somatic dysfunction of cervical region: Secondary | ICD-10-CM | POA: Diagnosis not present

## 2017-01-01 DIAGNOSIS — M5030 Other cervical disc degeneration, unspecified cervical region: Secondary | ICD-10-CM | POA: Diagnosis not present

## 2017-01-01 DIAGNOSIS — M9904 Segmental and somatic dysfunction of sacral region: Secondary | ICD-10-CM | POA: Diagnosis not present

## 2017-01-02 DIAGNOSIS — Z1211 Encounter for screening for malignant neoplasm of colon: Secondary | ICD-10-CM | POA: Diagnosis not present

## 2017-01-02 DIAGNOSIS — K640 First degree hemorrhoids: Secondary | ICD-10-CM | POA: Diagnosis not present

## 2017-01-07 DIAGNOSIS — M9904 Segmental and somatic dysfunction of sacral region: Secondary | ICD-10-CM | POA: Diagnosis not present

## 2017-01-07 DIAGNOSIS — M9901 Segmental and somatic dysfunction of cervical region: Secondary | ICD-10-CM | POA: Diagnosis not present

## 2017-01-07 DIAGNOSIS — M5136 Other intervertebral disc degeneration, lumbar region: Secondary | ICD-10-CM | POA: Diagnosis not present

## 2017-01-07 DIAGNOSIS — M9902 Segmental and somatic dysfunction of thoracic region: Secondary | ICD-10-CM | POA: Diagnosis not present

## 2017-01-07 DIAGNOSIS — M9903 Segmental and somatic dysfunction of lumbar region: Secondary | ICD-10-CM | POA: Diagnosis not present

## 2017-01-07 DIAGNOSIS — M5030 Other cervical disc degeneration, unspecified cervical region: Secondary | ICD-10-CM | POA: Diagnosis not present

## 2017-01-17 DIAGNOSIS — Z79891 Long term (current) use of opiate analgesic: Secondary | ICD-10-CM | POA: Diagnosis not present

## 2017-01-17 DIAGNOSIS — G894 Chronic pain syndrome: Secondary | ICD-10-CM | POA: Diagnosis not present

## 2017-01-17 DIAGNOSIS — G609 Hereditary and idiopathic neuropathy, unspecified: Secondary | ICD-10-CM | POA: Diagnosis not present

## 2017-02-01 DIAGNOSIS — Z79899 Other long term (current) drug therapy: Secondary | ICD-10-CM | POA: Diagnosis not present

## 2017-02-01 DIAGNOSIS — Z125 Encounter for screening for malignant neoplasm of prostate: Secondary | ICD-10-CM | POA: Diagnosis not present

## 2017-02-01 DIAGNOSIS — E119 Type 2 diabetes mellitus without complications: Secondary | ICD-10-CM | POA: Diagnosis not present

## 2017-02-01 DIAGNOSIS — I10 Essential (primary) hypertension: Secondary | ICD-10-CM | POA: Diagnosis not present

## 2017-02-01 DIAGNOSIS — E1142 Type 2 diabetes mellitus with diabetic polyneuropathy: Secondary | ICD-10-CM | POA: Diagnosis not present

## 2017-02-01 DIAGNOSIS — Z0001 Encounter for general adult medical examination with abnormal findings: Secondary | ICD-10-CM | POA: Diagnosis not present

## 2017-02-01 DIAGNOSIS — E782 Mixed hyperlipidemia: Secondary | ICD-10-CM | POA: Diagnosis not present

## 2017-02-01 DIAGNOSIS — I2510 Atherosclerotic heart disease of native coronary artery without angina pectoris: Secondary | ICD-10-CM | POA: Diagnosis not present

## 2017-02-02 ENCOUNTER — Inpatient Hospital Stay (HOSPITAL_COMMUNITY)
Admission: EM | Admit: 2017-02-02 | Discharge: 2017-02-06 | DRG: 274 | Disposition: A | Discharge status: Home / Self Care | Payer: Medicare Other | Attending: Internal Medicine | Admitting: Internal Medicine

## 2017-02-02 ENCOUNTER — Telehealth: Payer: Self-pay | Admitting: Adult Health

## 2017-02-02 ENCOUNTER — Encounter (HOSPITAL_COMMUNITY): Payer: Self-pay

## 2017-02-02 ENCOUNTER — Ambulatory Visit (HOSPITAL_COMMUNITY)
Admission: EM | Admit: 2017-02-02 | Discharge: 2017-02-02 | Disposition: A | Discharge status: Home / Self Care | Payer: Medicare Other | Attending: Internal Medicine | Admitting: Internal Medicine

## 2017-02-02 ENCOUNTER — Other Ambulatory Visit: Payer: Self-pay | Admitting: Adult Health

## 2017-02-02 ENCOUNTER — Encounter (HOSPITAL_COMMUNITY): Payer: Self-pay | Admitting: Family Medicine

## 2017-02-02 DIAGNOSIS — E119 Type 2 diabetes mellitus without complications: Secondary | ICD-10-CM | POA: Diagnosis present

## 2017-02-02 DIAGNOSIS — I483 Typical atrial flutter: Principal | ICD-10-CM | POA: Diagnosis present

## 2017-02-02 DIAGNOSIS — Z951 Presence of aortocoronary bypass graft: Secondary | ICD-10-CM | POA: Diagnosis not present

## 2017-02-02 DIAGNOSIS — Z7982 Long term (current) use of aspirin: Secondary | ICD-10-CM

## 2017-02-02 DIAGNOSIS — I440 Atrioventricular block, first degree: Secondary | ICD-10-CM | POA: Diagnosis present

## 2017-02-02 DIAGNOSIS — R531 Weakness: Secondary | ICD-10-CM | POA: Diagnosis not present

## 2017-02-02 DIAGNOSIS — I484 Atypical atrial flutter: Secondary | ICD-10-CM | POA: Diagnosis not present

## 2017-02-02 DIAGNOSIS — I2510 Atherosclerotic heart disease of native coronary artery without angina pectoris: Secondary | ICD-10-CM | POA: Diagnosis present

## 2017-02-02 DIAGNOSIS — I4892 Unspecified atrial flutter: Secondary | ICD-10-CM

## 2017-02-02 DIAGNOSIS — Z79899 Other long term (current) drug therapy: Secondary | ICD-10-CM

## 2017-02-02 DIAGNOSIS — R000 Tachycardia, unspecified: Secondary | ICD-10-CM | POA: Diagnosis not present

## 2017-02-02 DIAGNOSIS — Z955 Presence of coronary angioplasty implant and graft: Secondary | ICD-10-CM

## 2017-02-02 DIAGNOSIS — I10 Essential (primary) hypertension: Secondary | ICD-10-CM | POA: Diagnosis not present

## 2017-02-02 DIAGNOSIS — E7800 Pure hypercholesterolemia, unspecified: Secondary | ICD-10-CM | POA: Diagnosis present

## 2017-02-02 HISTORY — DX: Essential (primary) hypertension: I10

## 2017-02-02 HISTORY — DX: Atherosclerotic heart disease of native coronary artery without angina pectoris: I25.10

## 2017-02-02 HISTORY — DX: Type 2 diabetes mellitus without complications: E11.9

## 2017-02-02 HISTORY — DX: Unspecified atrial flutter: I48.92

## 2017-02-02 HISTORY — DX: Hyperlipidemia, unspecified: E78.5

## 2017-02-02 LAB — CBC WITH DIFFERENTIAL/PLATELET
BASOS PCT: 0 %
Basophils Absolute: 0 10*3/uL (ref 0.0–0.1)
EOS ABS: 0.1 10*3/uL (ref 0.0–0.7)
EOS PCT: 1 %
HCT: 39.6 % (ref 39.0–52.0)
Hemoglobin: 13.6 g/dL (ref 13.0–17.0)
LYMPHS ABS: 0.9 10*3/uL (ref 0.7–4.0)
Lymphocytes Relative: 11 %
MCH: 31.6 pg (ref 26.0–34.0)
MCHC: 34.3 g/dL (ref 30.0–36.0)
MCV: 92.1 fL (ref 78.0–100.0)
MONO ABS: 1 10*3/uL (ref 0.1–1.0)
MONOS PCT: 13 %
Neutro Abs: 6.2 10*3/uL (ref 1.7–7.7)
Neutrophils Relative %: 75 %
Platelets: 146 10*3/uL — ABNORMAL LOW (ref 150–400)
RBC: 4.3 MIL/uL (ref 4.22–5.81)
RDW: 16.4 % — AB (ref 11.5–15.5)
WBC: 8.2 10*3/uL (ref 4.0–10.5)

## 2017-02-02 LAB — BASIC METABOLIC PANEL
Anion gap: 7 (ref 5–15)
BUN: 11 mg/dL (ref 6–20)
CALCIUM: 8.6 mg/dL — AB (ref 8.9–10.3)
CO2: 24 mmol/L (ref 22–32)
CREATININE: 0.84 mg/dL (ref 0.61–1.24)
Chloride: 102 mmol/L (ref 101–111)
Glucose, Bld: 116 mg/dL — ABNORMAL HIGH (ref 65–99)
Potassium: 4.1 mmol/L (ref 3.5–5.1)
SODIUM: 133 mmol/L — AB (ref 135–145)

## 2017-02-02 LAB — GLUCOSE, CAPILLARY: Glucose-Capillary: 225 mg/dL — ABNORMAL HIGH (ref 65–99)

## 2017-02-02 LAB — TSH: TSH: 1.642 u[IU]/mL (ref 0.350–4.500)

## 2017-02-02 LAB — I-STAT TROPONIN, ED: TROPONIN I, POC: 0 ng/mL (ref 0.00–0.08)

## 2017-02-02 LAB — MAGNESIUM: MAGNESIUM: 2 mg/dL (ref 1.7–2.4)

## 2017-02-02 MED ORDER — ISOSORBIDE MONONITRATE ER 30 MG PO TB24
30.0000 mg | ORAL_TABLET | Freq: Every day | ORAL | Status: DC
  Administered 2017-02-03 – 2017-02-06 (×4): 30 mg via ORAL
  Filled 2017-02-02 (×4): qty 1

## 2017-02-02 MED ORDER — APIXABAN 5 MG PO TABS: 5.0000 mg | ORAL_TABLET | Freq: Two times a day (BID) | ORAL | 0 refills | Status: DC

## 2017-02-02 MED ORDER — SODIUM CHLORIDE 0.9 % IV SOLN
Freq: Once | INTRAVENOUS | Status: AC
  Administered 2017-02-02: 14:00:00 via INTRAVENOUS

## 2017-02-02 MED ORDER — RAMIPRIL 5 MG PO CAPS
10.0000 mg | ORAL_CAPSULE | Freq: Every day | ORAL | Status: DC
  Administered 2017-02-03 – 2017-02-06 (×4): 10 mg via ORAL
  Filled 2017-02-02 (×4): qty 2

## 2017-02-02 MED ORDER — ADENOSINE 6 MG/2ML IV SOLN
INTRAVENOUS | Status: AC
  Filled 2017-02-02: qty 4

## 2017-02-02 MED ORDER — DILTIAZEM HCL 60 MG PO TABS
60.0000 mg | ORAL_TABLET | Freq: Four times a day (QID) | ORAL | Status: DC
  Administered 2017-02-02 – 2017-02-05 (×10): 60 mg via ORAL
  Filled 2017-02-02 (×10): qty 1

## 2017-02-02 MED ORDER — PREGABALIN 50 MG PO CAPS
200.0000 mg | ORAL_CAPSULE | Freq: Three times a day (TID) | ORAL | Status: DC
  Administered 2017-02-02 – 2017-02-06 (×11): 200 mg via ORAL
  Filled 2017-02-02 (×11): qty 4

## 2017-02-02 MED ORDER — DILTIAZEM HCL 60 MG PO TABS
60.0000 mg | ORAL_TABLET | Freq: Once | ORAL | Status: AC
  Administered 2017-02-02: 60 mg via ORAL
  Filled 2017-02-02: qty 1

## 2017-02-02 MED ORDER — OXYCODONE-ACETAMINOPHEN 5-325 MG PO TABS: 1.0000 | ORAL_TABLET | Freq: Every day | ORAL | Status: DC | PRN

## 2017-02-02 MED ORDER — APIXABAN 5 MG PO TABS
5.0000 mg | ORAL_TABLET | Freq: Two times a day (BID) | ORAL | Status: DC
  Administered 2017-02-02 – 2017-02-06 (×8): 5 mg via ORAL
  Filled 2017-02-02 (×8): qty 1

## 2017-02-02 MED ORDER — TAPENTADOL HCL ER 50 MG PO TB12: 150.0000 mg | ORAL_TABLET | Freq: Every day | ORAL | Status: DC

## 2017-02-02 MED ORDER — NIACIN ER (ANTIHYPERLIPIDEMIC) 500 MG PO TBCR
1000.0000 mg | EXTENDED_RELEASE_TABLET | Freq: Every day | ORAL | Status: DC
  Administered 2017-02-02 – 2017-02-05 (×4): 1000 mg via ORAL
  Filled 2017-02-02 (×4): qty 2

## 2017-02-02 MED ORDER — RANOLAZINE ER 500 MG PO TB12
500.0000 mg | ORAL_TABLET | Freq: Two times a day (BID) | ORAL | Status: DC
  Administered 2017-02-02 – 2017-02-06 (×8): 500 mg via ORAL
  Filled 2017-02-02 (×8): qty 1

## 2017-02-02 MED ORDER — DILTIAZEM HCL ER COATED BEADS 240 MG PO CP24: 240.0000 mg | ORAL_CAPSULE | Freq: Every day | ORAL | 0 refills | Status: DC

## 2017-02-02 MED ORDER — INSULIN ASPART 100 UNIT/ML ~~LOC~~ SOLN
0.0000 [IU] | Freq: Three times a day (TID) | SUBCUTANEOUS | Status: DC
  Administered 2017-02-03: 2 [IU] via SUBCUTANEOUS
  Administered 2017-02-04 (×2): 1 [IU] via SUBCUTANEOUS
  Administered 2017-02-04: 2 [IU] via SUBCUTANEOUS
  Administered 2017-02-05: 1 [IU] via SUBCUTANEOUS

## 2017-02-02 MED ORDER — ADENOSINE 6 MG/2ML IV SOLN
6.0000 mg | Freq: Once | INTRAVENOUS | Status: AC
  Administered 2017-02-02: 6 mg via INTRAVENOUS

## 2017-02-02 MED ORDER — DILTIAZEM HCL 25 MG/5ML IV SOLN
15.0000 mg | Freq: Once | INTRAVENOUS | Status: AC
  Administered 2017-02-02: 15 mg via INTRAVENOUS
  Filled 2017-02-02: qty 5

## 2017-02-02 MED ORDER — ROSUVASTATIN CALCIUM 10 MG PO TABS
40.0000 mg | ORAL_TABLET | Freq: Every day | ORAL | Status: DC
  Administered 2017-02-03 – 2017-02-05 (×3): 40 mg via ORAL
  Filled 2017-02-02 (×3): qty 4

## 2017-02-02 MED ORDER — OMEGA-3-ACID ETHYL ESTERS 1 G PO CAPS
2.0000 | ORAL_CAPSULE | Freq: Every day | ORAL | Status: DC
  Administered 2017-02-03 – 2017-02-06 (×4): 2 g via ORAL
  Filled 2017-02-02 (×4): qty 2

## 2017-02-02 NOTE — ED Triage Notes (Signed)
Pt here for elevated heart rate. sts that this started yesterday and he also received a shingles shot yesterday. sts some chills and fatigue from the shot. sts he walked 2 miles this am and felt faint. Denies any chest pain, SOB.

## 2017-02-02 NOTE — H&P (Addendum)
CARDIOLOGY ER ENCOUNTER   Patient ID: Ruben [name] MRN: [id number]  DOB/AGE: [birth year] 71 y.o. Admit date: 02/02/2017  Primary Care Physician: Lujean Amel, MD Primary Cardiologist: Shelva Majestic MD  Clinical Summary Ruben [name] is a 71 y.o.male with known history of CAD, CABG with (LIMA to LAD, SVG to Diagonal and SVG to OM , SVG to RCA. He also had stenting of the left main into the superior ramus intermediate vessel due to vein graft to circumflex being occluded. He also underwent stenting of the vein graft to the right coronary artery. Most recent catheterization 2013 showed patent stents. Other history includes hyperlipidemia, hypertension,and diabetes.   The patient called today stating that his heart rate was very elevated. He also tracing of 170-70 bpm. He this was noted on his blood pressure machine when he was taking daily blood pressures. The patient denies shortness of breath, chest pain, he was feeling a little weak and tired. He felt might be related the fact that he just had a shingles vaccine, but he had been feeling his heart racing the day before as well. He is experiencing some flulike symptoms as well. Normally very active having walked 2 miles today. He states that his heart rate never came back to normal afterwards. When he checks his blood pressure found to be hypotensive.   The patient was directed to urgent care to have a EKG completed to evaluate heart rhythm and rate. In urgent care he was found to have rapid heart rhythm given adenosine, and found to have atrial flutter.He was then transported to ED. on arrival in emergency room patient blood pressure was 124/86, heart rate 120-121  bpm. He was afebrile.   Pertinent labs hemoglobin 13.6 hematocrit 39.6 white blood cells 8.2, sodium 133. Potassium or 0.1. BUN 11, creatinine 0.84. Glucose 116. Currently the patient is pain-free resting comfortably in the emergency room. Review of home medications has him on aspirin  81 mg daily, carvedilol 6.25 mg twice a day, Plavix 70 5 mg daily, isosorbide 30 mg daily, niacin 1000 mg at bedtime, ramipril 10 mg daily, Ranexa 1000 mg twice a day, Crestor 40 mg daily, with other noncardiac medications include Actos, Lyrica, loratadine, oxycodone when necessary.   Patient was given 50 mg IV diltiazem, heart rate became less rapid, ranging in the 90s to 107.   Allergies  Allergen Reactions  . Cymbalta [Duloxetine Hcl] Diarrhea    Home Medications  (Not in a hospital admission)  Scheduled Medications    Infusions    PRN Medications    No past medical history on file.  Past Surgical History:  Procedure Laterality Date  . CARDIAC CATHETERIZATION  12/13/2011   No intervention - recommend medical therapy with increased medical trial  . CARDIOVASCULAR STRESS TEST  10/26/2010   No scintigraphic evidence of inducible myocardial ischemia. No ECG changes. EKG negative for ischemia.  . CORONARY ARTERY BYPASS GRAFT  1997   LIMA-LAD, vein graft to diagonal, vein graft to obtuse marginal, and vein graft to RCA  . LEFT HEART CATHETERIZATION WITH CORONARY/GRAFT ANGIOGRAM N/A 12/13/2011   Procedure: LEFT HEART CATHETERIZATION WITH Beatrix Fetters;  Surgeon: Troy Sine, MD;  Location: [facility] CATH LAB;  Service: Cardiovascular;  Laterality: N/A;  . LOWER EXTREMITY ARTERIAL DOPPLER  01/30/2000   Normal arterial study  . TRANSTHORACIC ECHOCARDIOGRAM  12/12/2011   EF >55%, LA severely dilated.    Family History  Problem Relation Age of Onset  . Heart disease Father   .  Diabetes Father   . Hyperlipidemia Sister     Social History Ruben [name] reports that he has never smoked. He has never used smokeless tobacco. Ruben [name] reports that he drinks alcohol.  Review of Systems Otherwise reviewed and negative except as outlined.  Physical Examination Temp:  [98 F (36.7 C)] 98 F (36.7 C) (05/12 1426) Pulse Rate:  [120-121] 121 (05/12 1530) Resp:  [17-18] 17  (05/12 1530) BP: (107-124)/(75-86) 124/86 (05/12 1530) SpO2:  [96 %-97 %] 97 % (05/12 1530) No intake or output data in the 24 hours ending 02/02/17 1642  Gen: No acute distress. HEENT: Conjunctiva and lids normal, oropharynx clear with moist mucosa. Neck: Supple, no elevated JVP or carotid bruits, no thyromegaly. Lungs: Clear to auscultation, nonlabored breathing at rest. Cardiac: Regular rate and rhythm, tachycardic,, no S3 or significant systolic murmur, no pericardial rub. Abdomen: Soft, nontender, no hepatomegaly, bowel sounds present, no guarding or rebound. Extremities: No pitting edema, distal pulses 2+. Skin: Warm and dry. Musculoskeletal: No kyphosis. Neuropsychiatric: Alert and oriented x3, affect grossly appropriate.   Lab Results  Basic Metabolic Panel:  Recent Labs Lab 02/02/17 1533  NA 133*  K 4.1  CL 102  CO2 24  GLUCOSE 116*  BUN 11  CREATININE 0.84  CALCIUM 8.6*  MG 2.0   CBC:  Recent Labs Lab 02/02/17 1533  WBC 8.2  NEUTROABS 6.2  HGB 13.6  HCT 39.6  MCV 92.1  PLT 146*     Prior Cardiac Testing/Procedures:  Echocardiogram 12/20/2015  Left ventricle: The cavity size was normal. Wall thickness was   increased in a pattern of mild LVH. Systolic function was normal.   The estimated ejection fraction was in the range of 55% to 60%.   Wall motion was normal; there were no regional wall motion   abnormalities. - Aortic valve: There was no stenosis. There was mild   regurgitation. - Aorta: Mildly dilated aortic root. Aortic root dimension: 39 mm   (ED). - Mitral valve: There was no significant regurgitation. - Left atrium: The atrium was mildly dilated. - Right ventricle: The cavity size was normal. Systolic function   was normal. - Right atrium: The atrium was mildly dilated. - Tricuspid valve: Peak RV-RA gradient (S): 15 mm Hg. - Pulmonary arteries: PA peak pressure: 18 mm Hg (S). - Inferior vena cava: The vessel was normal in size. The    respirophasic diameter changes were in the normal range (>= 50%),   consistent with normal central venous pressure.  Impressions:  - Normal LV size with mild LV hypertrophy. EF 55-60%. Normal RV   size and systolic function. Mild aortic insufficiency.  Stress MPI 12/08/2013 Impression Exercise Capacity:  Lexiscan with no exercise. BP Response:  Hypertensive blood pressure response. Clinical Symptoms:  No significant symptoms noted. ECG Impression:  uninterpretable ECG Comparison with Prior Nuclear Study: No significant change from previous study  ECG: Atrial flutter with rapid rate. 109 bpm   Impression and Recommendations 1. New-onset atrial flutter:  patient is feeling some better after IV diltiazem. He was given adenosine prior to arrival after being seen in urgent care with atrial flutter with RVR. Patient seen and examined by Dr. Haroldine Laws. He will be started on diltiazem 240 mg long-acting daily, will discontinue carvedilol, ELIQUIS 5 mg twice a day will be started and he will be given a dose here in the ER prior to leaving. A follow-up echocardiogram, an outpatient EP visit with Dr. Lovena Le or his associates will be planned  for next week.   2. Coronary artery disease: History as above. After discussion with Dr.Vitali Seibert, the patient will be taken off of aspirin and Plavix as ELIQUIS has been started... The patient will continue secondary prevention with ACE inhibitor and statin therapy. Continue antianginal therapy with Ranexa. Consider repeat ischemic evaluation should this be necessary in the setting of new onset atrial arrhythmia. Known stents to the right coronary artery with bypass.  3.  Hypercholesterolemia: Continue statin therapy.  He will have close follow-up with electrophysiology, and primary cardiologist Dr. Claiborne Billings. He will be discharged from the emergency room with instructions to stop aspirin and Plavix begin diltiazem and stop carvedilol.   Signed:  Phill Myron.  West Pugh, ANP, AACC  02/02/2017, 4:42 PM Co-Sign MD  Patient seen and examined with the above-signed Advanced Practice Provider and/or Housestaff. I personally reviewed laboratory data, imaging studies and relevant notes. I independently examined the patient and formulated the important aspects of the plan. I have edited the note to reflect any of my changes or salient points. I have personally discussed the plan with the patient and/or family.  71 y/o male with CAD presents with AFL of unclear duration. Currently asx. Rate slowed with cardizem.   Gave him the option of admission versus outpatient follow-up. He prefers to go home.   Will stop carvedilol start cardizem 240 daily. Start Eliquis 5 bid. Can stop ASA and Plavix. (last stent 2013)   Have reviewed strips with Dr. Lovena Le in EP who will see him in clinic on Tuesday with echo. Will then need TEE and ablation.  Glori Bickers, MD  5:11 PM

## 2017-02-02 NOTE — ED Notes (Signed)
Pt Sat up on the side of the bed and states he became dizzy and having a headache, pts heart rate 120 a fib. MD informed. Pt back in bed on heart monitor.

## 2017-02-02 NOTE — Progress Notes (Addendum)
CARDIOLOGY HISTORY & PHYSICAL   Referring Physician: Dr. Inda Castle, ER Reason for Admission: atrial flutter with RVR   HPI: Please see consult note signed by Dr. Haroldine Laws earlier today for full details.  Briefly, patient is a 71 yo man with known CAD, CABG (LIMA to LAD, SVG to Diagonal and SVG to OM , SVG to RCA) with multiple stents, including left main into the superior ramus intermediate vessel due to vein graft to circumflex being occluded, stenting of vein graft to the right coronary artery. Most recent catheterization 2013 showed patent stents. Other history includes hyperlipidemia, hypertension,and diabetes.  He had new onset palpitations today and noted elevated HR on his blood pressure monitor. He was directed to come for eval, and in the urgent care his underlying rhythm was flutter after adenosine was pushed. He was transported to the ER, where he was initially seen and examined by Dr. Haroldine Laws and day team. Please see H&P from earlier today. Plan had been for long acting diltiazem, apixaban, and future flutter ablation.  Re-evaluated at the request of ER physician Dr. Inda Castle. This evening, the patient felt poorly after sitting up in bed. HR jumped to 120s-130s. He does not feel as though he can deal with the intermittent tachycardia at home and wishes to be admitted for rate control.  Review of Systems:     Cardiac Review of Systems: {Y] = yes [ ]  = no  Chest Pain [    ]  Resting SOB [   ] Exertional SOB  [  ]  Orthopnea [  ]   Pedal Edema [   ]    Palpitations [Y  ] Syncope  [  ]   Presyncope [   ]  General Review of Systems: [Y] = yes [  ]=no Constitional: recent weight change [  ]; anorexia [  ]; fatigue [  ]; nausea [  ]; night sweats [  ]; fever [  ]; or chills [  ];                                                                     Eyes : blurred vision [  ]; diplopia [   ]; vision changes [  ];  Amaurosis fugax[  ]; Resp: cough [  ];  wheezing[  ];  hemoptysis[   ];  PND [  ];  GI:  gallstones[  ], vomiting[  ];  dysphagia[  ]; melena[  ];  hematochezia [  ]; heartburn[  ];   GU: kidney stones [  ]; hematuria[  ];   dysuria [  ];  nocturia[  ]; incontinence [  ];             Skin: rash, swelling[  ];, hair loss[  ];  peripheral edema[  ];  or itching[  ]; Musculosketetal: myalgias[  ];  joint swelling[  ];  joint erythema[  ];  joint pain[  ];  back pain[  ];  Heme/Lymph: bruising[  ];  bleeding[  ];  anemia[  ];  Neuro: TIA[  ];  headaches[Y  ];  stroke[  ];  vertigo[  ];  seizures[  ];   paresthesias[  ];  difficulty walking[  ]; peripheral neuropathy  Y  Psych:depression[  ]; anxiety[  ];  Endocrine: diabetes[  ];  thyroid dysfunction[  ];  Other:  No past medical history on file.   (Not in a hospital admission)   . apixaban  5 mg Oral BID  . diltiazem  60 mg Oral Once    Infusions:   Allergies  Allergen Reactions  . Cymbalta [Duloxetine Hcl] Diarrhea    Social History   Social History  . Marital status: Married    Spouse name: N/A  . Number of children: N/A  . Years of education: N/A   Occupational History  . Not on file.   Social History Main Topics  . Smoking status: Never Smoker  . Smokeless tobacco: Never Used  . Alcohol use Yes  . Drug use: Yes  . Sexual activity: Not on file   Other Topics Concern  . Not on file   Social History Narrative  . No narrative on file    Family History  Problem Relation Age of Onset  . Heart disease Father   . Diabetes Father   . Hyperlipidemia Sister     PHYSICAL EXAM: Vitals:   02/02/17 1924 02/02/17 2000  BP: (!) 126/99 118/80  Pulse:  (!) 109  Resp: 14 13  Temp:      No intake or output data in the 24 hours ending 02/02/17 2029  HR 90s-110s in flutter, BP 118/80 General:  Well appearing. No respiratory difficulty HEENT: normal Neck: supple. no JVD. Carotids 2+ bilat; no bruits. No lymphadenopathy or thryomegaly appreciated. Cor: PMI nondisplaced. Regular rate  & rhythm. No rubs, gallops or murmurs. Lungs: clear Abdomen: soft, nontender, nondistended. No hepatosplenomegaly. No bruits or masses. Good bowel sounds. Extremities: no cyanosis, clubbing, rash, edema Neuro: alert & oriented x 3, cranial nerves grossly intact. moves all 4 extremities w/o difficulty. Affect pleasant.  ECG:  Results for orders placed or performed during the hospital encounter of 02/02/17 (from the past 24 hour(s))  CBC with Differential     Status: Abnormal   Collection Time: 02/02/17  3:33 PM  Result Value Ref Range   WBC 8.2 4.0 - 10.5 K/uL   RBC 4.30 4.22 - 5.81 MIL/uL   Hemoglobin 13.6 13.0 - 17.0 g/dL   HCT 39.6 39.0 - 52.0 %   MCV 92.1 78.0 - 100.0 fL   MCH 31.6 26.0 - 34.0 pg   MCHC 34.3 30.0 - 36.0 g/dL   RDW 16.4 (H) 11.5 - 15.5 %   Platelets 146 (L) 150 - 400 K/uL   Neutrophils Relative % 75 %   Neutro Abs 6.2 1.7 - 7.7 K/uL   Lymphocytes Relative 11 %   Lymphs Abs 0.9 0.7 - 4.0 K/uL   Monocytes Relative 13 %   Monocytes Absolute 1.0 0.1 - 1.0 K/uL   Eosinophils Relative 1 %   Eosinophils Absolute 0.1 0.0 - 0.7 K/uL   Basophils Relative 0 %   Basophils Absolute 0.0 0.0 - 0.1 K/uL  Basic metabolic panel     Status: Abnormal   Collection Time: 02/02/17  3:33 PM  Result Value Ref Range   Sodium 133 (L) 135 - 145 mmol/L   Potassium 4.1 3.5 - 5.1 mmol/L   Chloride 102 101 - 111 mmol/L   CO2 24 22 - 32 mmol/L   Glucose, Bld 116 (H) 65 - 99 mg/dL   BUN 11 6 - 20 mg/dL   Creatinine, Ser 0.84 0.61 - 1.24 mg/dL   Calcium 8.6 (L) 8.9 -  10.3 mg/dL   GFR calc non Af Amer >60 >60 mL/min   GFR calc Af Amer >60 >60 mL/min   Anion gap 7 5 - 15  Magnesium     Status: None   Collection Time: 02/02/17  3:33 PM  Result Value Ref Range   Magnesium 2.0 1.7 - 2.4 mg/dL  I-Stat Troponin, ED (not at [facility])     Status: None   Collection Time: 02/02/17  3:46 PM  Result Value Ref Range   Troponin i, poc 0.00 0.00 - 0.08 ng/mL   Comment 3          TSH     Status: None    Collection Time: 02/02/17  4:06 PM  Result Value Ref Range   TSH 1.642 0.350 - 4.500 uIU/mL   No results found.   ASSESSMENT/PLAN:  1. New-onset atrial flutter:  Patient has only received 1 dose of IV diltiazem. Initial plan was for 240 mg long acting diltiazem daily. Discussed that we could give patient diltiazem PO and monitor for his response/symptoms. Patient discussed with his wife, and their preference at this time is to come into the hospital for rate control. It is not known when a possible flutter ablation might be scheduled, but if this could be done early in the week patient would prefer to remain in house for rate control and TEE, then ablation. -HR well controlled when resting in the bed, but rises with exertion. Will trial fractionating diltiazem into 60 mg short acting PO q6 hours and monitor response -received PM dose of apixaban, continue this -as previously documented in the H&P, stop carvedilol, aspirin, and clopidogrel -echocardiogram when rate controlled -will need TEE prior to ablation, prefers ablation ASAP if possible. He was told TEE may happen tomorrow, will make NPO at [facility]  2. Coronary artery disease: As per earlier H&P -stop aspirin and Plavix as ELIQUIS has been started -continue ramipril and statin -continue imdur and ranexa for angina. No current chest pain -Known stents to the right coronary artery with bypass.  3.  Hypercholesterolemia:  -Continue statin therapy. -on niacin and lovaza at home, continue  4. Diabetes:  -hold saxagliptin, pioglitizone while inpatient. Will put on SSI and accucheck  Buford Dresser, cardiology fellow

## 2017-02-02 NOTE — ED Triage Notes (Signed)
PT sent from UC after receiving 6mg  of adenosine IV for rapid heart beat. PT stable at this time NAD. VSS

## 2017-02-02 NOTE — ED Provider Notes (Signed)
[place]    CSN: [id number] Arrival date & time: 02/02/17  1200     History   Chief Complaint Chief Complaint  Patient presents with  . Tachycardia    HPI RILEY HALLUM is a 71 y.o. male. He presents today with heart rate of 120. This was noted yesterday, when he had a shingles vaccine administered. He was asymptomatic at that time, and continues to be. No chest discomfort, no breathlessness, no leg swelling. Exercise tolerance continues stable, took a 2 mile walk this morning without incident. Not taking any cold products, dietary supplements, extra caffeine.  HPI  History reviewed. No pertinent past medical history.  Patient Active Problem List   Diagnosis Date Noted  . CAD (coronary artery disease) 07/13/2013  . Mixed hyperlipidemia 07/13/2013  . DM2 (diabetes mellitus, type 2) ([place]) 07/13/2013  . Peripheral neuropathy 07/13/2013    Past Surgical History:  Procedure Laterality Date  . CARDIAC CATHETERIZATION  12/13/2011   No intervention - recommend medical therapy with increased medical trial  . CARDIOVASCULAR STRESS TEST  10/26/2010   No scintigraphic evidence of inducible myocardial ischemia. No ECG changes. EKG negative for ischemia.  . CORONARY ARTERY BYPASS GRAFT  1997   LIMA-LAD, vein graft to diagonal, vein graft to obtuse marginal, and vein graft to RCA  . LEFT HEART CATHETERIZATION WITH CORONARY/GRAFT ANGIOGRAM N/A 12/13/2011   Procedure: LEFT HEART CATHETERIZATION WITH Beatrix Fetters;  Surgeon: Troy Sine, MD;  Location: [facility] CATH LAB;  Service: Cardiovascular;  Laterality: N/A;  . LOWER EXTREMITY ARTERIAL DOPPLER  01/30/2000   Normal arterial study  . TRANSTHORACIC ECHOCARDIOGRAM  12/12/2011   EF >55%, LA severely dilated.       Home Medications    Prior to Admission medications   Medication Sig Start Date End Date Taking? Authorizing Provider  aspirin EC 81 MG tablet Take 81 mg by mouth at bedtime.    [provider]    carvedilol (COREG) 6.25 MG tablet Take 1 tablet (6.25 mg total) by mouth 2 (two) times daily with a meal. 07/31/16   Troy Sine, MD  clopidogrel (PLAVIX) 75 MG tablet Take 1 tablet (75 mg total) by mouth daily. 07/31/16   Troy Sine, MD  isosorbide mononitrate (IMDUR) 30 MG 24 hr tablet Take 1 tablet (30 mg total) by mouth daily. 07/31/16   Troy Sine, MD  L-Methylfolate-B6-B12 (METANX PO) Take by mouth 2 (two) times daily.    [provider]  niacin (NIASPAN) 1000 MG CR tablet Take 1 tablet (1,000 mg total) by mouth at bedtime. 07/31/16   Troy Sine, MD  nitroGLYCERIN (NITROLINGUAL) 0.4 MG/SPRAY spray Place 1 spray under the tongue every 5 (five) minutes x 3 doses as needed for chest pain. 08/06/16   Troy Sine, MD  omega-3 acid ethyl esters (LOVAZA) 1 g capsule Take 2 capsules (2 g total) by mouth daily. 07/31/16   Troy Sine, MD  oxyCODONE-acetaminophen (PERCOCET) 7.5-325 MG per tablet Take 1 tablet by mouth daily as needed for pain.    [provider]  pioglitazone (ACTOS) 45 MG tablet Take 45 mg by mouth daily.    [provider]  pregabalin (LYRICA) 150 MG capsule Take 150 mg by mouth 4 (four) times daily.    [provider]  ramipril (ALTACE) 10 MG capsule TAKE 1 CAPSULE BY MOUTH DAILY. PLEASE SCHEDULE APPOINTMENT FOR REFILLS 12/03/16   Troy Sine, MD  ranolazine (RANEXA) 1000 MG SR tablet  TAKE 1 TABLET(1000 MG) BY MOUTH TWICE DAILY 07/31/16   Troy Sine, MD  rosuvastatin (CRESTOR) 40 MG tablet Take 1 tablet (40 mg total) by mouth daily. 07/31/16   Troy Sine, MD  saxagliptin HCl (ONGLYZA) 5 MG TABS tablet Take 5 mg by mouth daily.    [provider]  Tapentadol HCl 150 MG TB12 Take 150 mg by mouth every 12 (twelve) hours. 12/16/14   [provider]    Family History Family History  Problem Relation Age of Onset  . Heart disease Father   . Diabetes Father   . Hyperlipidemia Sister     Social  History Social History  Substance Use Topics  . Smoking status: Never Smoker  . Smokeless tobacco: Never Used  . Alcohol use Yes     Allergies   Cymbalta [duloxetine hcl]   Review of Systems Review of Systems  All other systems reviewed and are negative.    Physical Exam Triage Vital Signs  Orthostatic VS for the past 24 hrs:  BP- Lying Pulse- Lying BP- Sitting Pulse- Sitting BP- Standing at 0 minutes Pulse- Standing at 0 minutes  02/02/17 1229 122/76 120 124/80 123 123/76 121   Physical Exam  Constitutional: He is oriented to person, place, and time. No distress.  Alert, nicely groomed  HENT:  Head: Atraumatic.  Eyes:  Conjugate gaze, no eye redness/drainage  Neck: Neck supple.  Cardiovascular: Regular rhythm.   Heart rate 120s  Pulmonary/Chest: No respiratory distress. He has no wheezes. He has no rales.  Lungs clear, symmetric breath sounds  Abdominal: He exhibits no distension.  Musculoskeletal: Normal range of motion. He exhibits no edema.  Neurological: He is alert and oriented to person, place, and time.  Skin: Skin is warm and dry.  No cyanosis  Nursing note and vitals reviewed.    UC Treatments / Results   EKG Narrow complex tachycardia HR 122.  No acute ST or T wave changes.  RSR' pattern in V1, but this appears to be present on ECG of 07/31/16.      Procedures Procedures (including critical care time) Spoke with Dr Haroldine Laws.  6mg  adenosine given ivp with flutter waves revealed on rhythm strip.  Will take patient to ED for further evaluation of new atrial flutter.   Final Clinical Impressions(s) / UC Diagnoses   Final diagnoses:  Atypical atrial flutter ([place])   To ED for further evaluation/management of new atrial flutter.     Sherlene Shams, MD 02/02/17 [phone]

## 2017-02-02 NOTE — ED Notes (Addendum)
Procedure started to give pt adenosine. Pt hooked up to cardiac monitor, crash cart at bedside. MD at bedside

## 2017-02-02 NOTE — ED Notes (Signed)
Procedure ended and pt tolerated well. Pt alert and oriented and talking. Airway intact. Spoke with charge nurse in the ED and will be sending pt there for further evaluation.

## 2017-02-02 NOTE — Telephone Encounter (Signed)
Patient called stating that his heart rate was elevated between 116 120 bpm. This started yesterday. He also had a shingles vaccine shortly thereafter, and is been having flulike symptoms. The patient was able to get up and walk 2 miles today but remains fatigued, having flulike symptoms, and feels his heart rate around 120 bpm by his blood pressure machine. He has not experienced this before. He is on carvedilol which she has taken already this morning, though 6.25 mg twice a day. His blood pressure was hypotensive, 96/48. He denies nausea vomiting or diaphoresis. He denies dehydration. Due to the rapid heart rhythm and hypotension he's been advised to go to [facility] outpatient urgent care for EKG and evaluation. He verbalizes understanding.

## 2017-02-02 NOTE — Progress Notes (Signed)
ANTICOAGULATION CONSULT NOTE - Initial Consult  Pharmacy Consult for apixaban Indication: atrial fibrillation  Allergies  Allergen Reactions  . Cymbalta [Duloxetine Hcl] Diarrhea    Patient Measurements:     Vital Signs: Temp: 98 F (36.7 C) (05/12 1426) Temp Source: Oral (05/12 1426) BP: 124/86 (05/12 1530) Pulse Rate: 121 (05/12 1530)  Labs:  Recent Labs  02/02/17 1533  HGB 13.6  HCT 39.6  PLT 146*  CREATININE 0.84    CrCl cannot be calculated (Unknown ideal weight.).   Assessment: 71 YOM sent from urgent care with new onset AFib. Was in narrow complex tachycardia at [facility] which had flutter waves revealed after adenosine was administered. Age: 71, wt: 94 kg (in 11/2016 at [facility]), SCr 0.84 today in the ED. Baseline hgb 13.6, plts 146- no bleeding noted at baseline.  Goal of Therapy:    Monitor platelets by anticoagulation protocol: Yes   Plan:  -apixaban 5mg  BID -CBC q72h at minimum -follow for s/s bleeding   Jovonni Borquez D. Winthrop Shannahan, PharmD, BCPS Clinical Pharmacist Pager: [phone] 02/02/2017 4:49 PM

## 2017-02-02 NOTE — ED Provider Notes (Signed)
[place] DEPT Provider Note   CSN: [id number] Arrival date & time: 02/02/17  1403     History   Chief Complaint Chief Complaint  Patient presents with  . Irregular Heart Beat    HPI Ruben [name] is a 71 y.o. male with history of CAD (s/p remote CABG and multiple stents), DM, and HL who presents to the ED from UC for tachycardia.  Yesterday he went to his PCP for a regularly scheduled appointment and received Shingrix vaccine, but was noted to be tachycardic to 120s when being checked in.  He checked his pulse and BP later at home and found that he continued to be 110s-120s and he was borderline hypotensive with SBPs 100s.  He called his cardiologist's office and was directed to the UC for an EKG.  At the UC earlier today EKG showed narrow complex tachycardia to 120s, with flutter waves revealed after adenosine, and he was directed to the ED.  He has been asymptomatic with the exception of some mildy reduced exertional capacity, walking only 2 miles this morning instead of his normal 4 and feeling fatigued afterwards.  He also is having some myalgias and chills today which he attributes to the Shingrix.  No chest pain or dyspnea at rest.  He takes his meds, including carvedilol, regularly and took it this morning.  No recent med changes.  HPI  No past medical history on file.  Patient Active Problem List   Diagnosis Date Noted  . CAD (coronary artery disease) 07/13/2013  . Mixed hyperlipidemia 07/13/2013  . DM2 (diabetes mellitus, type 2) ([place]) 07/13/2013  . Peripheral neuropathy 07/13/2013    Past Surgical History:  Procedure Laterality Date  . CARDIAC CATHETERIZATION  12/13/2011   No intervention - recommend medical therapy with increased medical trial  . CARDIOVASCULAR STRESS TEST  10/26/2010   No scintigraphic evidence of inducible myocardial ischemia. No ECG changes. EKG negative for ischemia.  . CORONARY ARTERY BYPASS GRAFT  1997   LIMA-LAD, vein graft to  diagonal, vein graft to obtuse marginal, and vein graft to RCA  . LEFT HEART CATHETERIZATION WITH CORONARY/GRAFT ANGIOGRAM N/A 12/13/2011   Procedure: LEFT HEART CATHETERIZATION WITH Beatrix Fetters;  Surgeon: Troy Sine, MD;  Location: [facility] CATH LAB;  Service: Cardiovascular;  Laterality: N/A;  . LOWER EXTREMITY ARTERIAL DOPPLER  01/30/2000   Normal arterial study  . TRANSTHORACIC ECHOCARDIOGRAM  12/12/2011   EF >55%, LA severely dilated.       Home Medications    Prior to Admission medications   Medication Sig Start Date End Date Taking? Authorizing Provider  aspirin EC 81 MG tablet Take 81 mg by mouth daily.    Yes [provider]  carvedilol (COREG) 6.25 MG tablet Take 1 tablet (6.25 mg total) by mouth 2 (two) times daily with a meal. 07/31/16  Yes Troy Sine, MD  clopidogrel (PLAVIX) 75 MG tablet Take 1 tablet (75 mg total) by mouth daily. 07/31/16  Yes Troy Sine, MD  fluticasone (FLONASE) 50 MCG/ACT nasal spray Place 1 spray into both nostrils daily as needed for allergies or rhinitis.   Yes [provider]  isosorbide mononitrate (IMDUR) 30 MG 24 hr tablet Take 1 tablet (30 mg total) by mouth daily. 07/31/16  Yes Troy Sine, MD  L-Methylfolate-B6-B12 (METANX PO) Take 1 tablet by mouth 2 (two) times daily.    Yes [provider]  loratadine (CLARITIN) 10 MG tablet Take 10 mg by mouth daily as needed for  allergies.   Yes [provider]  niacin (NIASPAN) 1000 MG CR tablet Take 1 tablet (1,000 mg total) by mouth at bedtime. 07/31/16  Yes Troy Sine, MD  omega-3 acid ethyl esters (LOVAZA) 1 g capsule Take 2 capsules (2 g total) by mouth daily. 07/31/16  Yes Troy Sine, MD  pioglitazone (ACTOS) 45 MG tablet Take 45 mg by mouth daily.   Yes [provider]  pregabalin (LYRICA) 200 MG capsule Take 200 mg by mouth 3 (three) times daily.    Yes [provider]  ramipril (ALTACE) 10 MG capsule TAKE 1 CAPSULE BY  MOUTH DAILY. PLEASE SCHEDULE APPOINTMENT FOR REFILLS 12/03/16  Yes Troy Sine, MD  ranolazine (RANEXA) 1000 MG SR tablet TAKE 1 TABLET(1000 MG) BY MOUTH TWICE DAILY 07/31/16  Yes Troy Sine, MD  rosuvastatin (CRESTOR) 40 MG tablet Take 1 tablet (40 mg total) by mouth daily. 07/31/16  Yes Troy Sine, MD  saxagliptin HCl (ONGLYZA) 5 MG TABS tablet Take 5 mg by mouth daily.   Yes [provider]  Tapentadol HCl 150 MG TB12 Take 150 mg by mouth daily.  12/16/14  Yes [provider]  nitroGLYCERIN (NITROLINGUAL) 0.4 MG/SPRAY spray Place 1 spray under the tongue every 5 (five) minutes x 3 doses as needed for chest pain. 08/06/16   Troy Sine, MD  oxyCODONE-acetaminophen (PERCOCET/ROXICET) 5-325 MG tablet Take 1 tablet by mouth daily as needed for moderate pain.     [provider]    Family History Family History  Problem Relation Age of Onset  . Heart disease Father   . Diabetes Father   . Hyperlipidemia Sister     Social History Social History  Substance Use Topics  . Smoking status: Never Smoker  . Smokeless tobacco: Never Used  . Alcohol use Yes     Allergies   Cymbalta [duloxetine hcl]   Review of Systems Review of Systems  Constitutional: Positive for chills. Negative for fever.  Respiratory: Negative for chest tightness, shortness of breath and wheezing.   Cardiovascular: Negative for chest pain, palpitations and leg swelling.  Gastrointestinal: Negative for blood in stool and diarrhea.  Genitourinary: Negative for dysuria and hematuria.  Musculoskeletal: Positive for myalgias.  Neurological: Negative for dizziness, syncope and headaches.     Physical Exam Updated Vital Signs BP 124/86   Pulse (!) 121   Temp 98 F (36.7 C) (Oral)   Resp 17   SpO2 97%   Physical Exam  Constitutional: He is oriented to person, place, and time. He appears well-developed and well-nourished. No distress.  Cardiovascular:   Tachycardic Irregularly irregular rhythm Normal S1S2  Pulmonary/Chest: Effort normal and breath sounds normal. No respiratory distress.  Abdominal: Soft. He exhibits no distension. There is no tenderness.  Musculoskeletal: He exhibits no tenderness.  Trace bilateral LE pitting edema  Neurological: He is alert and oriented to person, place, and time.  Skin: Skin is warm and dry.  Psychiatric: He has a normal mood and affect. His behavior is normal.     ED Treatments / Results  Labs (all labs ordered are listed, but only abnormal results are displayed) Labs Reviewed  CBC WITH DIFFERENTIAL/PLATELET - Abnormal; Notable for the following:       Result Value   RDW 16.4 (*)    Platelets 146 (*)    All other components within normal limits  BASIC METABOLIC PANEL - Abnormal; Notable for the following:    Sodium 133 (*)  Glucose, Bld 116 (*)    Calcium 8.6 (*)    All other components within normal limits  MAGNESIUM  TSH  I-STAT TROPOININ, ED    EKG  EKG Interpretation  Date/Time:  Saturday Feb 02 2017 15:50:50 EDT Ventricular Rate:  107 PR Interval:    QRS Duration: 109 QT Interval:  364 QTC Calculation: 486 R Axis:   -78 Text Interpretation:  Atrial flutter LAD, consider left anterior fascicular block Abnormal R-wave progression, late transition Nonspecific T abnormalities, inferior leads Borderline prolonged QT interval Confirmed by Maryan Rued  MD, WHITNEY ([id number]) on 02/02/2017 4:10:02 PM      Print rhythm strips sent with patient from UC after adenosine push reveal flutter waves.  Radiology No results found.  Procedures Procedures (including critical care time)  Medications Ordered in ED Medications  diltiazem (CARDIZEM) injection 15 mg (15 mg Intravenous Given 02/02/17 1539)     Initial Impression / Assessment and Plan / ED Course  I have reviewed the triage vital signs and the nursing notes.  Pertinent labs & imaging results that were available during my care  of the patient were reviewed by me and considered in my medical decision making (see chart for details).  71 year old man with new onset atrial flutter with RVR of unclear duration.  He is asymptomatic and hemodynamically stable.  Will check labs for inciting factors and electrolyte disturbances, initiate rate control with AV nodal blocking agents.  He is not on any antiarrhythmics, no history of acute event to suggest MI/PE. -IV diltiazem 15 mg -CBC, BMP, Mg, trop  -repeat EKG -consult cardiology  Clinical Course as of Feb 03 1635  Sat Feb 02, 2017  1528 Cardiology consulted, discussed with Dr Jeffie Pollock.  [MO]  1600 Rates decreased to 100s after diltiazem, repeat EKG AFlutter with variable conduction.  Continues to be asymptomatic.  [MO]  1621 Mg and K normal  [MO]    Clinical Course User Index [MO] Minus Liberty, MD    Final Clinical Impressions(s) / ED Diagnoses   Final diagnoses:  Atrial flutter with rapid ventricular response [facility])    New Prescriptions New Prescriptions   No medications on file     Minus Liberty, MD 02/02/17 [place], Paulina, MD 02/03/17 2231

## 2017-02-03 DIAGNOSIS — E7800 Pure hypercholesterolemia, unspecified: Secondary | ICD-10-CM | POA: Diagnosis present

## 2017-02-03 DIAGNOSIS — Z955 Presence of coronary angioplasty implant and graft: Secondary | ICD-10-CM | POA: Diagnosis not present

## 2017-02-03 DIAGNOSIS — Z79899 Other long term (current) drug therapy: Secondary | ICD-10-CM | POA: Diagnosis not present

## 2017-02-03 DIAGNOSIS — I4892 Unspecified atrial flutter: Secondary | ICD-10-CM | POA: Diagnosis not present

## 2017-02-03 DIAGNOSIS — I10 Essential (primary) hypertension: Secondary | ICD-10-CM | POA: Diagnosis not present

## 2017-02-03 DIAGNOSIS — I351 Nonrheumatic aortic (valve) insufficiency: Secondary | ICD-10-CM | POA: Diagnosis not present

## 2017-02-03 DIAGNOSIS — I2510 Atherosclerotic heart disease of native coronary artery without angina pectoris: Secondary | ICD-10-CM | POA: Diagnosis not present

## 2017-02-03 DIAGNOSIS — I440 Atrioventricular block, first degree: Secondary | ICD-10-CM | POA: Diagnosis present

## 2017-02-03 DIAGNOSIS — Z7982 Long term (current) use of aspirin: Secondary | ICD-10-CM | POA: Diagnosis not present

## 2017-02-03 DIAGNOSIS — I483 Typical atrial flutter: Secondary | ICD-10-CM | POA: Diagnosis present

## 2017-02-03 DIAGNOSIS — Z951 Presence of aortocoronary bypass graft: Secondary | ICD-10-CM | POA: Diagnosis not present

## 2017-02-03 DIAGNOSIS — E119 Type 2 diabetes mellitus without complications: Secondary | ICD-10-CM | POA: Diagnosis not present

## 2017-02-03 DIAGNOSIS — R531 Weakness: Secondary | ICD-10-CM | POA: Diagnosis not present

## 2017-02-03 LAB — BASIC METABOLIC PANEL
Anion gap: 7 (ref 5–15)
BUN: 8 mg/dL (ref 6–20)
CHLORIDE: 106 mmol/L (ref 101–111)
CO2: 25 mmol/L (ref 22–32)
CREATININE: 0.83 mg/dL (ref 0.61–1.24)
Calcium: 8.5 mg/dL — ABNORMAL LOW (ref 8.9–10.3)
GFR calc Af Amer: >60 mL/min (ref >60)
GFR calc non Af Amer: >60 mL/min (ref >60)
GLUCOSE: 121 mg/dL — AB (ref 65–99)
Potassium: 3.9 mmol/L (ref 3.5–5.1)
Sodium: 138 mmol/L (ref 135–145)

## 2017-02-03 LAB — CBC
HEMATOCRIT: 38.8 % — AB (ref 39.0–52.0)
Hemoglobin: 13 g/dL (ref 13.0–17.0)
MCH: 30.7 pg (ref 26.0–34.0)
MCHC: 33.5 g/dL (ref 30.0–36.0)
MCV: 91.7 fL (ref 78.0–100.0)
PLATELETS: 131 10*3/uL — AB (ref 150–400)
RBC: 4.23 MIL/uL (ref 4.22–5.81)
RDW: 16.7 % — AB (ref 11.5–15.5)
WBC: 6.4 10*3/uL (ref 4.0–10.5)

## 2017-02-03 LAB — GLUCOSE, CAPILLARY
GLUCOSE-CAPILLARY: 120 mg/dL — AB (ref 65–99)
GLUCOSE-CAPILLARY: 151 mg/dL — AB (ref 65–99)
Glucose-Capillary: 125 mg/dL — ABNORMAL HIGH (ref 65–99)

## 2017-02-03 MED ORDER — TAPENTADOL HCL 50 MG PO TABS
50.0000 mg | ORAL_TABLET | Freq: Three times a day (TID) | ORAL | Status: DC
  Administered 2017-02-03 – 2017-02-06 (×9): 50 mg via ORAL
  Filled 2017-02-03 (×9): qty 1

## 2017-02-03 NOTE — Progress Notes (Signed)
CARDIOLOGY PROGRESS NOTE Subjective:   Admitted last night through ER with new onset AFL. (unclear duration)  Was dizzy last night but now better. Rate controlled on oral Dilt. On apixaban  Denies CP or SOB   Intake/Output Summary (Last 24 hours) at 02/03/17 0911 Last data filed at 02/02/17 2200  Gross per 24 hour  Intake              180 ml  Output                0 ml  Net              180 ml    Current meds: . apixaban  5 mg Oral BID  . diltiazem  60 mg Oral Q6H  . insulin aspart  0-9 Units Subcutaneous TID WC  . isosorbide mononitrate  30 mg Oral Daily  . niacin  1,000 mg Oral QHS  . omega-3 acid ethyl esters  2 capsule Oral Daily  . pregabalin  200 mg Oral TID  . ramipril  10 mg Oral Daily  . ranolazine  500 mg Oral BID  . rosuvastatin  40 mg Oral QHS  . tapentadol  50 mg Oral Q8H   Infusions:    Objective:  Blood pressure (!) 112/57, pulse (!) 119, temperature 99.8 F (37.7 C), temperature source Oral, resp. rate 18, height 5\' 10"  (1.778 m), weight 94.5 kg (208 lb 4.8 oz), SpO2 94 %. Weight change:   Physical Exam: General:  Lying in bed Well appearing. No resp difficulty HEENT: normal Neck: supple. JVP 6  . Carotids 2+ bilat; no bruits. No lymphadenopathy or thryomegaly appreciated. Cor: PMI nondisplaced. Irregular rate & rhythm. No rubs, gallops or murmurs. Lungs: clear no wheeze Abdomen: soft, nontender, nondistended. No hepatosplenomegaly. No bruits or masses. Good bowel sounds. Extremities: no cyanosis, clubbing, rash, edema Neuro: alert & orientedx3, cranial nerves grossly intact. moves all 4 extremities w/o difficulty. Affect pleasant  Telemetry:  AFL 80s Personally reviewed   Lab Results: Basic Metabolic Panel:  Recent Labs Lab 02/02/17 1533  NA 133*  K 4.1  CL 102  CO2 24  GLUCOSE 116*  BUN 11  CREATININE 0.84  CALCIUM 8.6*  MG 2.0   Liver Function Tests: No results for input(s): AST, ALT, ALKPHOS, BILITOT, PROT, ALBUMIN in the  last 168 hours. No results for input(s): LIPASE, AMYLASE in the last 168 hours. No results for input(s): AMMONIA in the last 168 hours. CBC:  Recent Labs Lab 02/02/17 1533  WBC 8.2  NEUTROABS 6.2  HGB 13.6  HCT 39.6  MCV 92.1  PLT 146*   Cardiac Enzymes: No results for input(s): CKTOTAL, CKMB, CKMBINDEX, TROPONINI in the last 168 hours. BNP: Invalid input(s): POCBNP CBG:  Recent Labs Lab 02/02/17 2145  GLUCAP 225*   Microbiology: No results found for: CULT No results for input(s): CULT, SDES in the last 168 hours.  Imaging: No results found.   ASSESSMENT/PLAN:   1. Atrial flutter, typical --new onset but unclear duration. --Rate controlled currently on diltiazem --Apixaban started 5/12 (ASA and Plavix stopped) --Dr. Lovena Le aware. Will need TEE this week followed by RFA. Will keep NPO after MN --EP will assume care in am  2. CAD s/p CABG and RCA stents (remote) --stable. No s/s angina. --off ASA & Plavix due to Apixaban --Continue Ranexa and statin   3. DM2 --oral meds on hold currently --Covering with SSI -- Continue statin and ACE -- Consider Jardiance  LOS: 0 days    Glori Bickers, MD 02/03/2017, 9:11 AM

## 2017-02-04 ENCOUNTER — Encounter (HOSPITAL_COMMUNITY)
Admission: EM | Disposition: A | Discharge status: Home / Self Care | Payer: Self-pay | Source: Home / Self Care | Attending: Cardiology

## 2017-02-04 ENCOUNTER — Inpatient Hospital Stay (HOSPITAL_COMMUNITY): Payer: Medicare Other

## 2017-02-04 ENCOUNTER — Encounter (HOSPITAL_COMMUNITY): Payer: Self-pay

## 2017-02-04 DIAGNOSIS — I4892 Unspecified atrial flutter: Secondary | ICD-10-CM

## 2017-02-04 DIAGNOSIS — I351 Nonrheumatic aortic (valve) insufficiency: Secondary | ICD-10-CM

## 2017-02-04 HISTORY — PX: TEE WITHOUT CARDIOVERSION: SHX5443

## 2017-02-04 LAB — GLUCOSE, CAPILLARY
GLUCOSE-CAPILLARY: 135 mg/dL — AB (ref 65–99)
GLUCOSE-CAPILLARY: 160 mg/dL — AB (ref 65–99)
Glucose-Capillary: 121 mg/dL — ABNORMAL HIGH (ref 65–99)

## 2017-02-04 LAB — ECHO TEE: P 1/2 time: 365 ms

## 2017-02-04 SURGERY — ECHOCARDIOGRAM, TRANSESOPHAGEAL
Anesthesia: Moderate Sedation

## 2017-02-04 MED ORDER — MIDAZOLAM HCL 5 MG/ML IJ SOLN
INTRAMUSCULAR | Status: AC
  Filled 2017-02-04: qty 2

## 2017-02-04 MED ORDER — FENTANYL CITRATE (PF) 100 MCG/2ML IJ SOLN
INTRAMUSCULAR | Status: DC | PRN
  Administered 2017-02-04 (×2): 25 ug via INTRAVENOUS

## 2017-02-04 MED ORDER — SODIUM CHLORIDE 0.9 % IV SOLN: INTRAVENOUS | Status: DC

## 2017-02-04 MED ORDER — FENTANYL CITRATE (PF) 100 MCG/2ML IJ SOLN
INTRAMUSCULAR | Status: AC
  Filled 2017-02-04: qty 2

## 2017-02-04 MED ORDER — BUTAMBEN-TETRACAINE-BENZOCAINE 2-2-14 % EX AERO
INHALATION_SPRAY | CUTANEOUS | Status: DC | PRN
  Administered 2017-02-04: 2 via TOPICAL

## 2017-02-04 MED ORDER — MIDAZOLAM HCL 10 MG/2ML IJ SOLN
INTRAMUSCULAR | Status: DC | PRN
  Administered 2017-02-04 (×3): 2 mg via INTRAVENOUS

## 2017-02-04 NOTE — Consult Note (Signed)
ELECTROPHYSIOLOGY CONSULT NOTE    Patient ID: Ruben [name] MRN: [id number], DOB/AGE: 1946/04/01 71 y.o.  Admit date: 02/02/2017 Date of Consult: 02/04/2017   Primary Physician: Lujean Amel, MD Primary Cardiologist: Dr. Claiborne Billings  Reason for Consultation: AFlutter  HPI: Ruben [name] is a 71 y.o. male who is being seen today for the evaluation of AFlutter at the request of Dr. Haroldine Laws.  PMHx includes:  CAD (CABG 1997), HTN, HLD, DM. CAD history:  LIMA to the LAD, vein to the diagonal, vein to the obtuse marginal, and vein to the right coronary artery. In November 2004 he underwent stenting to the vein graft to the circumflex vessel. In December 2009 the graft to the circumflex was occluded and he underwent stenting of the left main into the superior ramus intermediate vessel. At that time he also underwent stenting of the vein graft to the right coronary artery. His last catheterization which was done for repeat chest pain in March 2013 showed patent stents. He has known occlusion of the graft that supplied the circumflex marginal vessel. The graft to diagonal vessel was patent and filled the proximal LAD system and the LIMA graft which filled the mid LAD system was widely patent. 12/08/2013 a followup nuclear perfusion study continued to show normal perfusion without scar or ischemia on his current medical therapy.  Ejection fraction was 64%  The patient was admitted to [facility] 02/02/17 noting his HR was elevated when checking his home BP.  He had been feeling under the weather like he was coming down with something though had a shingle vaccine and attributed the symptoms to that.  When he continued to feel poorly he also noted his BP unusually low and went to an [facility], there he was found in a tachycardia of some kind, given adenosine and noted to be AFlutter and transported to [facility].    Onset of AFlutter is unknown, he was started on Eliquis, his home ASA/plavis stopped, was given dilt for rate  control with plans for EP to see him, in discussion over the weekend with plans for TEE and ablation procedure.  The patient saw his PMD on Friday for a routine well-visit while there was noted that his HR was 114, though feeling well and recommended to check his HR the following day at home to make sure it normalized, he received a shingle vaccine and otherwise nothing unusual.  The following day he walked his usual 2 miles and afterwards felt run down like he was getting the flu, subjective fever-like symptoms of malaise and chills.  He noted his HR remained elevated and eventually went to the [facility].  He has otherwise been feeling great, no CP or exertional intolerances.  He has not felt any palpitations or had any awareness of his heart beat.  April while at a restaurant he had a weak spell that he atrributed to low BS, then his HR was 60bpm.    LABS: K+ 3.9 BUN/Creat 8/0/83 WBC 6.4 H/H 13/38 plts 131 TSH 1.642 poc Trop 0.00  Past Medical History:  Diagnosis Date  . Atrial flutter ([place]) 02/02/2017  . CAD, multiple vessel   . Diabetes mellitus ([place])   . Dyslipidemia   . Hypertension      Surgical History:  Past Surgical History:  Procedure Laterality Date  . CARDIAC CATHETERIZATION  12/13/2011   No intervention - recommend medical therapy with increased medical trial  . CARDIOVASCULAR STRESS TEST  10/26/2010   No scintigraphic evidence of inducible myocardial  ischemia. No ECG changes. EKG negative for ischemia.  . CORONARY ARTERY BYPASS GRAFT  1997   LIMA-LAD, vein graft to diagonal, vein graft to obtuse marginal, and vein graft to RCA  . LEFT HEART CATHETERIZATION WITH CORONARY/GRAFT ANGIOGRAM N/A 12/13/2011   Procedure: LEFT HEART CATHETERIZATION WITH Beatrix Fetters;  Surgeon: Troy Sine, MD;  Location: [facility] CATH LAB;  Service: Cardiovascular;  Laterality: N/A;  . LOWER EXTREMITY ARTERIAL DOPPLER  01/30/2000   Normal arterial study  . TRANSTHORACIC ECHOCARDIOGRAM  12/12/2011    EF >55%, LA severely dilated.     Prescriptions Prior to Admission  Medication Sig Dispense Refill Last Dose  . fluticasone (FLONASE) 50 MCG/ACT nasal spray Place 1 spray into both nostrils daily as needed for allergies or rhinitis.   Past Month at Unknown time  . isosorbide mononitrate (IMDUR) 30 MG 24 hr tablet Take 1 tablet (30 mg total) by mouth daily. 90 tablet 3 02/02/2017 at Unknown time  . L-Methylfolate-B6-B12 (METANX PO) Take 1 tablet by mouth 2 (two) times daily.    02/02/2017 at AM  . loratadine (CLARITIN) 10 MG tablet Take 10 mg by mouth daily as needed for allergies.   02/02/2017 at Unknown time  . niacin (NIASPAN) 1000 MG CR tablet Take 1 tablet (1,000 mg total) by mouth at bedtime. 90 tablet 3 02/01/2017 at Unknown time  . omega-3 acid ethyl esters (LOVAZA) 1 g capsule Take 2 capsules (2 g total) by mouth daily. 90 capsule 3 02/01/2017 at Unknown time  . pioglitazone (ACTOS) 45 MG tablet Take 45 mg by mouth daily.   02/02/2017 at Unknown time  . pregabalin (LYRICA) 200 MG capsule Take 200 mg by mouth 3 (three) times daily.    02/02/2017 at Unknown time  . ramipril (ALTACE) 10 MG capsule TAKE 1 CAPSULE BY MOUTH DAILY. PLEASE SCHEDULE APPOINTMENT FOR REFILLS 90 capsule 3 02/01/2017 at Unknown time  . ranolazine (RANEXA) 1000 MG SR tablet TAKE 1 TABLET(1000 MG) BY MOUTH TWICE DAILY 180 tablet 3 02/02/2017 at AM  . rosuvastatin (CRESTOR) 40 MG tablet Take 1 tablet (40 mg total) by mouth daily. 90 tablet 3 02/01/2017 at Unknown time  . saxagliptin HCl (ONGLYZA) 5 MG TABS tablet Take 5 mg by mouth daily.   02/02/2017 at Unknown time  . Tapentadol HCl 150 MG TB12 Take 150 mg by mouth daily.    02/02/2017 at Unknown time  . nitroGLYCERIN (NITROLINGUAL) 0.4 MG/SPRAY spray Place 1 spray under the tongue every 5 (five) minutes x 3 doses as needed for chest pain. 12 g 11 PRN  . oxyCODONE-acetaminophen (PERCOCET/ROXICET) 5-325 MG tablet Take 1 tablet by mouth daily as needed for moderate pain.    PRN     Inpatient Medications:  . apixaban  5 mg Oral BID  . diltiazem  60 mg Oral Q6H  . insulin aspart  0-9 Units Subcutaneous TID WC  . isosorbide mononitrate  30 mg Oral Daily  . niacin  1,000 mg Oral QHS  . omega-3 acid ethyl esters  2 capsule Oral Daily  . pregabalin  200 mg Oral TID  . ramipril  10 mg Oral Daily  . ranolazine  500 mg Oral BID  . rosuvastatin  40 mg Oral QHS  . tapentadol  50 mg Oral Q8H    Allergies:  Allergies  Allergen Reactions  . Cymbalta [Duloxetine Hcl] Diarrhea    Social History   Social History  . Marital status: Married    Spouse name: N/A  .  Number of children: N/A  . Years of education: N/A   Occupational History  . Not on file.   Social History Main Topics  . Smoking status: Never Smoker  . Smokeless tobacco: Never Used  . Alcohol use Yes  . Drug use: Yes  . Sexual activity: Not on file   Other Topics Concern  . Not on file   Social History Narrative  . No narrative on file     Family History  Problem Relation Age of Onset  . Heart disease Father   . Diabetes Father   . Hyperlipidemia Sister      Review of Systems: All other systems reviewed and are otherwise negative except as noted above.  Physical Exam: Vitals:   02/03/17 1435 02/03/17 1747 02/03/17 2055 02/04/17 0410  BP:  105/60 (!) 122/96 107/61  Pulse: 96  (!) 59 79  Resp: 18   16  Temp: 98.4 F (36.9 C)  97.5 F (36.4 C) 98 F (36.7 C)  TempSrc: Oral  Axillary Oral  SpO2:   100% 95%  Weight:    204 lb 12.8 oz (92.9 kg)  Height:        GEN- The patient is well appearing, alert and oriented x 3 today.   HEENT: normocephalic, atraumatic; sclera clear, conjunctiva pink; hearing intact; oropharynx clear; neck supple, no JVP Lymph- no cervical lymphadenopathy Lungs-  CTA b/l, normal work of breathing.  No wheezes, rales, rhonchi Heart- IRRR , no murmurs, rubs or gallops, PMI not laterally displaced GI- soft, non-tender, non-distended Extremities- no  clubbing, cyanosis,  or edema MS- no significant deformity or atrophy Skin- warm and dry, no rash or lesion Psych- euthymic mood, full affect Neuro- no gross deficits observed  Labs:   Lab Results  Component Value Date   WBC 6.4 02/03/2017   HGB 13.0 02/03/2017   HCT 38.8 (L) 02/03/2017   MCV 91.7 02/03/2017   PLT 131 (L) 02/03/2017    Recent Labs Lab 02/03/17 0912  NA 138  K 3.9  CL 106  CO2 25  BUN 8  CREATININE 0.83  CALCIUM 8.5*  GLUCOSE 121*      Radiology/Studies: No results found.  Reviewed by myself: EKG: #1 AFlutter 122bpm #2 AFlutter (looks typical), 107bpm, QRS 131ms TELEMETRY: AFlutter generally 60's  Echo this admission is pending  12/20/15 TTE Study Conclusions - Left ventricle: The cavity size was normal. Wall thickness was   increased in a pattern of mild LVH. Systolic function was normal.   The estimated ejection fraction was in the range of 55% to 60%.   Wall motion was normal; there were no regional wall motion   abnormalities. - Aortic valve: There was no stenosis. There was mild   regurgitation. - Aorta: Mildly dilated aortic root. Aortic root dimension: 39 mm   (ED). - Mitral valve: There was no significant regurgitation. - Left atrium: The atrium was mildly dilated. - Right ventricle: The cavity size was normal. Systolic function   was normal. - Right atrium: The atrium was mildly dilated. - Tricuspid valve: Peak RV-RA gradient (S): 15 mm Hg. - Pulmonary arteries: PA peak pressure: 18 mm Hg (S). - Inferior vena cava: The vessel was normal in size. The   respirophasic diameter changes were in the normal range (>= 50%),   consistent with normal central venous pressure. Impressions: - Normal LV size with mild LV hypertrophy. EF 55-60%. Normal RV   size and systolic function. Mild aortic insufficiency.    Assessment  and Plan:   1. Atrial flutter, typical --new onset but unclear duration. --Rate controlled currently on diltiazem  PO --Apixaban started 5/12 (ASA and Plavix stopped)    CHA2DS2Vasc is at least 3 (patient denies dx of [facility]), started on Eliquis here  Discussed risks/benefits of TEE with the patient he is agreeable to proceed, also discussed ablation procedure pending TEE findings,he would like to discuss with Dr. Caryl Comes, though he would likely want to proceed if TEE is w/o thrombus  2. CAD s/p CABG and RCA stents (remote) --stable. No s/s angina. --off ASA & Plavix due to Apixaban --Continue Ranexa and statin   3. DM2 --oral meds on hold currently --Covering with SSI -- Continue statin and ACE   4. HTN     No changes     Patient denies this diagnosis   Signed, Tommye Standard, PA-C 02/04/2017 8:59 AM  Patient with known coronary artery disease was found to have atrial flutter with rapid rate associated with exercise intolerance. With slowing of the rate he's had fewer symptoms and has had no palpitations. ECG. Personally reviewed  demonstrates typical atrial flutter. Thromboembolic risk profile is notable for diabetes, vascular disease at age for a CHADS-VASc score of greater than or equal to 3 Physical examination is notable for his irregular heart rate   The patient's atrial flutter is somewhat problematic symptom wise.   with his thromboembolic profile as it is, it is reasonable to proceed with catheter ablation with the intention of discontinuing his anticoagulation. This decision is increasingly controversial because of the associated risk of atrial fibrillation developing in the wake of successful flutter ablation. Still, I think, that it is reasonable to proceed this way with ongoing daily monitoring for the presence of atrial fibrillation using either palpation or AliveCor monitor.  He will undergo TEE today. In the event that this is normal, he'll undergo catheter ablation tomorrow with [name].  Continue apixoban  Agree with discontinuation of antiplatlet therapies

## 2017-02-04 NOTE — Progress Notes (Signed)
  Echocardiogram Echocardiogram Transesophageal has been performed.  Ruben [name] Ruben [name] 02/04/2017, 2:05 PM

## 2017-02-04 NOTE — Progress Notes (Signed)
    [facility] has been requested to perform a transesophageal echocardiogram on Ruben [name] for atrial flutter.  After careful review of history and examination, the risks and benefits of transesophageal echocardiogram have been explained including risks of esophageal damage, perforation (1:10,000 risk), bleeding, pharyngeal hematoma as well as other potential complications associated with conscious sedation including aspiration, arrhythmia, respiratory failure and death. Alternatives to treatment were discussed, questions were answered. Patient is willing to proceed.   Baldwin Jamaica, PA-C  02/04/2017 9:55 AM

## 2017-02-04 NOTE — H&P (View-Only) (Signed)
CARDIOLOGY PROGRESS NOTE Subjective:   Admitted last night through ER with new onset AFL. (unclear duration)  Was dizzy last night but now better. Rate controlled on oral Dilt. On apixaban  Denies CP or SOB   Intake/Output Summary (Last 24 hours) at 02/03/17 0911 Last data filed at 02/02/17 2200  Gross per 24 hour  Intake              180 ml  Output                0 ml  Net              180 ml    Current meds: . apixaban  5 mg Oral BID  . diltiazem  60 mg Oral Q6H  . insulin aspart  0-9 Units Subcutaneous TID WC  . isosorbide mononitrate  30 mg Oral Daily  . niacin  1,000 mg Oral QHS  . omega-3 acid ethyl esters  2 capsule Oral Daily  . pregabalin  200 mg Oral TID  . ramipril  10 mg Oral Daily  . ranolazine  500 mg Oral BID  . rosuvastatin  40 mg Oral QHS  . tapentadol  50 mg Oral Q8H   Infusions:    Objective:  Blood pressure (!) 112/57, pulse (!) 119, temperature 99.8 F (37.7 C), temperature source Oral, resp. rate 18, height 5\' 10"  (1.778 m), weight 94.5 kg (208 lb 4.8 oz), SpO2 94 %. Weight change:   Physical Exam: General:  Lying in bed Well appearing. No resp difficulty HEENT: normal Neck: supple. JVP 6  . Carotids 2+ bilat; no bruits. No lymphadenopathy or thryomegaly appreciated. Cor: PMI nondisplaced. Irregular rate & rhythm. No rubs, gallops or murmurs. Lungs: clear no wheeze Abdomen: soft, nontender, nondistended. No hepatosplenomegaly. No bruits or masses. Good bowel sounds. Extremities: no cyanosis, clubbing, rash, edema Neuro: alert & orientedx3, cranial nerves grossly intact. moves all 4 extremities w/o difficulty. Affect pleasant  Telemetry:  AFL 80s Personally reviewed   Lab Results: Basic Metabolic Panel:  Recent Labs Lab 02/02/17 1533  NA 133*  K 4.1  CL 102  CO2 24  GLUCOSE 116*  BUN 11  CREATININE 0.84  CALCIUM 8.6*  MG 2.0   Liver Function Tests: No results for input(s): AST, ALT, ALKPHOS, BILITOT, PROT, ALBUMIN in the  last 168 hours. No results for input(s): LIPASE, AMYLASE in the last 168 hours. No results for input(s): AMMONIA in the last 168 hours. CBC:  Recent Labs Lab 02/02/17 1533  WBC 8.2  NEUTROABS 6.2  HGB 13.6  HCT 39.6  MCV 92.1  PLT 146*   Cardiac Enzymes: No results for input(s): CKTOTAL, CKMB, CKMBINDEX, TROPONINI in the last 168 hours. BNP: Invalid input(s): POCBNP CBG:  Recent Labs Lab 02/02/17 2145  GLUCAP 225*   Microbiology: No results found for: CULT No results for input(s): CULT, SDES in the last 168 hours.  Imaging: No results found.   ASSESSMENT/PLAN:   1. Atrial flutter, typical --new onset but unclear duration. --Rate controlled currently on diltiazem --Apixaban started 5/12 (ASA and Plavix stopped) --Dr. Lovena Le aware. Will need TEE this week followed by RFA. Will keep NPO after MN --EP will assume care in am  2. CAD s/p CABG and RCA stents (remote) --stable. No s/s angina. --off ASA & Plavix due to Apixaban --Continue Ranexa and statin   3. DM2 --oral meds on hold currently --Covering with SSI -- Continue statin and ACE -- Consider Jardiance  LOS: 0 days    Glori Bickers, MD 02/03/2017, 9:11 AM

## 2017-02-04 NOTE — Interval H&P Note (Signed)
History and Physical Interval Note:  02/04/2017 12:36 PM  Ruben [name]  has presented today for surgery, with the diagnosis of a fib  The various methods of treatment have been discussed with the patient and family. After consideration of risks, benefits and other options for treatment, the patient has consented to  Procedure(s): TRANSESOPHAGEAL ECHOCARDIOGRAM (TEE) (N/A) as a surgical intervention .  The patient's history has been reviewed, patient examined, no change in status, stable for surgery.  I have reviewed the patient's chart and labs.  Questions were answered to the patient's satisfaction.     Ena Dawley

## 2017-02-04 NOTE — CV Procedure (Signed)
     Transesophageal Echocardiogram Note  Ruben [name] [id number] 1946-06-18  Procedure: Transesophageal Echocardiogram Indications: atrial flutter  Procedure Details Consent: Obtained Time Out: Verified patient identification, verified procedure, site/side was marked, verified correct patient position, special equipment/implants available, Radiology Safety Procedures followed,  medications/allergies/relevent history reviewed, required imaging and test results available.  Performed  During this procedure the patient is administered a total of Versed 6 mg and Fentanyl 50 mg to achieve and maintain moderate conscious sedation.  The patient's heart rate, blood pressure, and oxygen saturation are monitored continuously during the procedure. The period of conscious sedation is 30 minutes, of which I was present face-to-face 100% of this time.  Left ventricle: 50-55% Aortic valve: mild regurgitation. - Mitral valve: There was mild regurgitation. - Left atrium: The atrium was mildly dilated. No thrombus in the left atrium or left atrial appendage. Normal emptying and filling velocities.  -Tricuspid valve: Mld regurgitation  Complications: No apparent complications Patient did tolerate procedure well.  Ruben Dawley, MD, [facility] 02/04/2017, 12:38 PM

## 2017-02-04 NOTE — Care Management Note (Signed)
Case Management Note  Patient Details  Name: Ruben [name] MRN: [id number] Date of Birth: Feb 07, 1946  Subjective/Objective:  Pt presented for Atrial Flutter. Post TEE. Pt is from home and plans to return home once stable.                  Action/Plan: Benefits check completed:  S/W  AYLE  @ [facility]  [facility]  # [phone]   1. ELIQUIS  2.5 MG BID   COVER- YES  CO-PAY- 30 % OF TOTAL COST  TIER- 3 DRUG  PRIOR APPROVAL-NO   2. ELIQUIS  5 MG BID   COVER- YES  CO-PAY- 30 % OF TOTAL COST  TIER- 3 DRUG  PRIOR APPROVAL- NO   PREFERRED PHARMACY : [facility]  Expected Discharge Date:                  Expected Discharge Plan:  Home/Self Care  In-House Referral:  NA  Discharge planning Services  CM Consult, Medication Assistance  Post Acute Care Choice:  NA Choice offered to:  NA  DME Arranged:  N/A DME Agency:  NA  HH Arranged:  NA HH Agency:  NA  Status of Service:  Completed, signed off  If discussed at [place] of Stay Meetings, dates discussed:    Additional Comments:  Bethena Roys, RN 02/04/2017, 3:33 PM

## 2017-02-05 ENCOUNTER — Inpatient Hospital Stay (HOSPITAL_COMMUNITY): Payer: Medicare Other | Admitting: Certified Registered Nurse Anesthetist

## 2017-02-05 ENCOUNTER — Encounter (HOSPITAL_COMMUNITY)
Admission: EM | Disposition: A | Discharge status: Home / Self Care | Payer: Self-pay | Source: Home / Self Care | Attending: Cardiology

## 2017-02-05 ENCOUNTER — Encounter (HOSPITAL_COMMUNITY): Payer: Self-pay | Admitting: Cardiology

## 2017-02-05 DIAGNOSIS — I4892 Unspecified atrial flutter: Secondary | ICD-10-CM

## 2017-02-05 HISTORY — PX: A-FLUTTER ABLATION: EP1230

## 2017-02-05 LAB — PROTIME-INR
INR: 1.1
Prothrombin Time: 14.3 seconds (ref 11.4–15.2)

## 2017-02-05 LAB — HEMOGLOBIN A1C
Hgb A1c MFr Bld: 5.5 % (ref 4.8–5.6)
Mean Plasma Glucose: 111 mg/dL

## 2017-02-05 LAB — GLUCOSE, CAPILLARY
Glucose-Capillary: 122 mg/dL — ABNORMAL HIGH (ref 65–99)
Glucose-Capillary: 102 mg/dL — ABNORMAL HIGH (ref 65–99)
Glucose-Capillary: 116 mg/dL — ABNORMAL HIGH (ref 65–99)
Glucose-Capillary: 109 mg/dL — ABNORMAL HIGH (ref 65–99)
Glucose-Capillary: 158 mg/dL — ABNORMAL HIGH (ref 65–99)

## 2017-02-05 SURGERY — A-FLUTTER ABLATION
Anesthesia: Monitor Anesthesia Care

## 2017-02-05 SURGERY — ABLATION, ARRHYTHMOGENIC FOCUS, FOR ATRIAL FLUTTER
Anesthesia: General

## 2017-02-05 MED ORDER — BUPIVACAINE HCL (PF) 0.25 % IJ SOLN
INTRAMUSCULAR | Status: AC
  Filled 2017-02-05: qty 30

## 2017-02-05 MED ORDER — HYDROCODONE-ACETAMINOPHEN 5-325 MG PO TABS: 1.0000 | ORAL_TABLET | ORAL | Status: DC | PRN

## 2017-02-05 MED ORDER — OFF THE BEAT BOOK
Freq: Once | Status: AC
  Administered 2017-02-05: 06:00:00
  Filled 2017-02-05: qty 1

## 2017-02-05 MED ORDER — BUPIVACAINE HCL (PF) 0.25 % IJ SOLN
INTRAMUSCULAR | Status: DC | PRN
  Administered 2017-02-05: 20 mL

## 2017-02-05 MED ORDER — PROPOFOL 500 MG/50ML IV EMUL
INTRAVENOUS | Status: DC | PRN
  Administered 2017-02-05: 100 ug/kg/min via INTRAVENOUS

## 2017-02-05 MED ORDER — MIDAZOLAM HCL 5 MG/5ML IJ SOLN
INTRAMUSCULAR | Status: DC | PRN
  Administered 2017-02-05: 1 mg via INTRAVENOUS

## 2017-02-05 MED ORDER — PROPOFOL 10 MG/ML IV BOLUS
INTRAVENOUS | Status: DC | PRN
  Administered 2017-02-05 (×3): 20 mg via INTRAVENOUS

## 2017-02-05 MED ORDER — SODIUM CHLORIDE 0.9% FLUSH
3.0000 mL | Freq: Two times a day (BID) | INTRAVENOUS | Status: DC
  Administered 2017-02-05 – 2017-02-06 (×3): 3 mL via INTRAVENOUS

## 2017-02-05 MED ORDER — SODIUM CHLORIDE 0.9% FLUSH: 3.0000 mL | INTRAVENOUS | Status: DC | PRN

## 2017-02-05 MED ORDER — ONDANSETRON HCL 4 MG/2ML IJ SOLN: 4.0000 mg | Freq: Four times a day (QID) | INTRAMUSCULAR | Status: DC | PRN

## 2017-02-05 MED ORDER — FENTANYL CITRATE (PF) 100 MCG/2ML IJ SOLN
INTRAMUSCULAR | Status: DC | PRN
  Administered 2017-02-05 (×2): 50 ug via INTRAVENOUS

## 2017-02-05 MED ORDER — ACETAMINOPHEN 325 MG PO TABS: 650.0000 mg | ORAL_TABLET | ORAL | Status: DC | PRN

## 2017-02-05 MED ORDER — SODIUM CHLORIDE 0.9 % IV SOLN
INTRAVENOUS | Status: DC
  Administered 2017-02-05: 06:00:00 via INTRAVENOUS

## 2017-02-05 MED ORDER — SODIUM CHLORIDE 0.9 % IV SOLN: 250.0000 mL | INTRAVENOUS | Status: DC | PRN

## 2017-02-05 SURGICAL SUPPLY — 11 items
BAG SNAP BAND KOVER 36X36 (MISCELLANEOUS) ×3 IMPLANT
BLANKET WARM UNDERBOD FULL ACC (MISCELLANEOUS) ×3 IMPLANT
CATH EZ STEER NAV 8MM F-J CUR (ABLATOR) ×3 IMPLANT
CATH WEBSTER BI DIR CS D-F CRV (CATHETERS) ×3 IMPLANT
COVER SWIFTLINK CONNECTOR (BAG) ×3 IMPLANT
PACK EP LATEX FREE (CUSTOM PROCEDURE TRAY) ×2
PACK EP LF (CUSTOM PROCEDURE TRAY) ×1 IMPLANT
PAD DEFIB LIFELINK (PAD) ×3 IMPLANT
SHEATH PINNACLE 6F 10CM (SHEATH) ×3 IMPLANT
SHEATH PINNACLE 7F 10CM (SHEATH) ×3 IMPLANT
SHEATH PINNACLE 8F 10CM (SHEATH) ×3 IMPLANT

## 2017-02-05 NOTE — Plan of Care (Signed)
Problem: Safety: Goal: Ability to remain free from injury will improve Outcome: Progressing Patient aware of how to use to the call bell.

## 2017-02-05 NOTE — Interval H&P Note (Signed)
History and Physical Interval Note:  02/05/2017 11:22 AM  Ruben [name]  has presented today for surgery, with the diagnosis of atrial flutter  The various methods of treatment have been discussed with the patient and family. After consideration of risks, benefits and other options for treatment, the patient has consented to  Procedure(s): A-Flutter Ablation (N/A) as a surgical intervention .  The patient's history has been reviewed, patient examined, no change in status, stable for surgery.  I have reviewed the patient's chart and labs.  Questions were answered to the patient's satisfaction.     Thompson Grayer

## 2017-02-05 NOTE — Progress Notes (Signed)
[place] for apixaban Indication: atrial fibrillation  Allergies  Allergen Reactions  . Cymbalta [Duloxetine Hcl] Diarrhea    Patient Measurements: Height: 5\' 10"  (177.8 cm) Weight: 205 lb 1.6 oz (93 kg) IBW/kg (Calculated) : 73   Vital Signs: Temp: 97.8 F (36.6 C) (05/15 0615) Temp Source: Oral (05/15 0615) BP: 119/72 (05/15 1301) Pulse Rate: 114 (05/15 0615)  Labs:  Recent Labs  02/02/17 1533 02/03/17 0912 02/05/17 0540  HGB 13.6 13.0  --   HCT 39.6 38.8*  --   PLT 146* 131*  --   LABPROT  --   --  14.3  INR  --   --  1.10  CREATININE 0.84 0.83  --     Estimated Creatinine Clearance: 96.2 mL/min (by C-G formula based on SCr of 0.83 mg/dL).   Assessment: 7 YOM sent from urgent care with new onset AFib. Was in narrow complex tachycardia at [facility] which had flutter waves revealed after adenosine was administered. Age: 71, wt: 93 kg, SCr 0.83 Hgb stable, plts down to 131 - no bleeding noted at baseline. TEE neg for thrombus, ablation today  Plan:  - Continue apixaban 5 mg PO BID - Education complete - Pharmacy signing off but will continue to follow peripherally   Renold Genta, PharmD, BCPS Clinical Pharmacist Phone for today - [place] - [phone] 02/05/2017 1:05 PM

## 2017-02-05 NOTE — Anesthesia Postprocedure Evaluation (Signed)
Anesthesia Post Note  Patient: Ruben [name]  Procedure(s) Performed: Procedure(s) (LRB): A-Flutter Ablation (N/A)  Patient location during evaluation: PACU Anesthesia Type: MAC Level of consciousness: awake and alert Pain management: pain level controlled Vital Signs Assessment: post-procedure vital signs reviewed and stable Respiratory status: spontaneous breathing, nonlabored ventilation and respiratory function stable Cardiovascular status: stable and blood pressure returned to baseline Anesthetic complications: no       Last Vitals:  Vitals:   02/05/17 1515 02/05/17 1520  BP: (!) 101/55 (!) 97/56  Pulse: 60 61  Resp: 13 14  Temp:      Last Pain:  Vitals:   02/05/17 1450  TempSrc: Temporal  PainSc:                  Mikaili Flippin,W. EDMOND

## 2017-02-05 NOTE — Progress Notes (Signed)
SUBJECTIVE: The patient is doing well today.  At this time, he denies chest pain, shortness of breath, or any new concerns.  Marland Kitchen apixaban  5 mg Oral BID  . diltiazem  60 mg Oral Q6H  . insulin aspart  0-9 Units Subcutaneous TID WC  . isosorbide mononitrate  30 mg Oral Daily  . niacin  1,000 mg Oral QHS  . omega-3 acid ethyl esters  2 capsule Oral Daily  . pregabalin  200 mg Oral TID  . ramipril  10 mg Oral Daily  . ranolazine  500 mg Oral BID  . rosuvastatin  40 mg Oral QHS  . tapentadol  50 mg Oral Q8H   . sodium chloride 50 mL/hr at 02/05/17 0627    OBJECTIVE: Physical Exam: Vitals:   02/04/17 1418 02/04/17 1734 02/04/17 2031 02/05/17 0615  BP: (!) 111/56 112/84 121/72 111/65  Pulse: (!) 102  (!) 118 (!) 114  Resp:      Temp: 97.9 F (36.6 C)  98 F (36.7 C) 97.8 F (36.6 C)  TempSrc:   Oral Oral  SpO2: 98%  98% 97%  Weight:    205 lb 1.6 oz (93 kg)  Height:        Intake/Output Summary (Last 24 hours) at 02/05/17 0646 Last data filed at 02/04/17 0900  Gross per 24 hour  Intake                0 ml  Output                0 ml  Net                0 ml    Telemetry is reviewed by myself: AFlutter, generally well controlled rates, 70's  GEN- The patient is well appearing, alert and oriented x 3 today.   Head- normocephalic, atraumatic Eyes-  Sclera clear, conjunctiva pink Ears- hearing intact Oropharynx- clear Neck- supple, no JVP Lungs- CTA b/l, normal work of breathing Heart-  IRRR, no significant murmurs, no rubs or gallops GI- soft, NT, ND Extremities- no clubbing, cyanosis,  or edema Skin- no rash or lesion Psych- euthymic mood, full affect Neuro- no gross deficits appreciated  LABS: Basic Metabolic Panel:  Recent Labs  02/02/17 1533 02/03/17 0912  NA 133* 138  K 4.1 3.9  CL 102 106  CO2 24 25  GLUCOSE 116* 121*  BUN 11 8  CREATININE 0.84 0.83  CALCIUM 8.6* 8.5*  MG 2.0  --    CBC:  Recent Labs  02/02/17 1533 02/03/17 0912  WBC  8.2 6.4  NEUTROABS 6.2  --   HGB 13.6 13.0  HCT 39.6 38.8*  MCV 92.1 91.7  PLT 146* 131*   Hemoglobin A1C:  Recent Labs  02/03/17 0912  HGBA1C 5.5   Thyroid Function Tests:  Recent Labs  02/02/17 1606  TSH 1.642    02/04/17: TEE Left ventricle: 50-55% Aortic valve: mild regurgitation. - Mitral valve: There was mild regurgitation. - Left atrium: The atrium was mildly dilated. No thrombus in the left atrium or left atrial appendage. Normal emptying and filling velocities.  -Tricuspid valve: Mld regurgitation   ASSESSMENT AND PLAN:  1. Atrial flutter, typical --new onset but unclear duration. --Rate controlled currently on diltiazem PO --Apixaban started 5/12 (ASA and Plavix stopped)    CHA2DS2Vasc is at least 3 (patient denies dx of [place]), started and maintained on Eliquis here without interruption   TEE yesterday without thrombus, EF 50-55%  planned for EPS/ablation today with [name].  2. CAD s/p CABG and RCA stents (remote) --stable. No s/s angina. --off ASA & Plavix due to Apixaban --Continue Ranexa and statin   3. DM2 --oral meds on hold currently --Covering with SSI --Continue statin and ACE  4. HTN     No changes     Patient denies this diagnosis  Tommye Standard, PA-C 02/05/2017 6:46 AM  I have seen, examined the patient, and reviewed the above assessment and plan.  Changes to above are made where necessary.  Therapeutic strategies for atrial flutter including medicine and ablation were discussed in detail with the patient today. Risk, benefits, and alternatives to EP study and radiofrequency ablation were also discussed in detail today. These risks include but are not limited to stroke, bleeding, vascular damage, tamponade, perforation, damage to the heart and other structures, AV block requiring pacemaker, worsening renal function, and death. The patient understands these risk and wishes to proceed.    Co Sign: Thompson Grayer, MD 02/05/2017 11:19  AM

## 2017-02-05 NOTE — CV Procedure (Signed)
Site area: Right femoral vein Site Prior to Removal:  level 0 Pressure Applied For: 15 mins Manual: yes Patient Status During Pull:  stable Post Pull Site:  level 0, clean and dry Post Pull Instructions Given:  yes Post Pull Pulses Present: DP +2 Dressing Applied: gauze and tegaderm Bedrest begins @ 1520 Comments: n/a

## 2017-02-05 NOTE — Anesthesia Preprocedure Evaluation (Addendum)
Anesthesia Evaluation  Patient identified by MRN, date of birth, ID band Patient awake    Reviewed: Allergy & Precautions, NPO status , Patient's Chart, lab work & pertinent test results, reviewed documented beta blocker date and time   Airway Mallampati: III  TM Distance: >3 FB Neck ROM: Limited    Dental  (+) Teeth Intact   Pulmonary    breath sounds clear to auscultation       Cardiovascular hypertension, Pt. on medications and Pt. on home beta blockers + CAD and + CABG  + dysrhythmias Atrial Fibrillation  Rhythm:Irregular Rate:Normal     Neuro/Psych    GI/Hepatic   Endo/Other  diabetes, Well Controlled, Type 2, Oral Hypoglycemic Agents  Renal/GU      Musculoskeletal   Abdominal   Peds  Hematology   Anesthesia Other Findings   Reproductive/Obstetrics                            Anesthesia Physical Anesthesia Plan  ASA: III  Anesthesia Plan: MAC   Post-op Pain Management:    Induction: Intravenous  Airway Management Planned: Nasal Cannula  Additional Equipment: None  Intra-op Plan:   Post-operative Plan:   Informed Consent: I have reviewed the patients History and Physical, chart, labs and discussed the procedure including the risks, benefits and alternatives for the proposed anesthesia with the patient or authorized representative who has indicated his/her understanding and acceptance.   Dental advisory given  Plan Discussed with: CRNA, Anesthesiologist and Surgeon  Anesthesia Plan Comments:        Anesthesia Quick Evaluation

## 2017-02-05 NOTE — Transfer of Care (Signed)
Immediate Anesthesia Transfer of Care Note  Patient: Ruben [name]  Procedure(s) Performed: Procedure(s): A-Flutter Ablation (N/A)  Patient Location: Cath Lab  Anesthesia Type:MAC  Level of Consciousness: awake, oriented and patient cooperative  Airway & Oxygen Therapy: Patient Spontanous Breathing  Post-op Assessment: Report given to RN, Post -op Vital signs reviewed and stable and Patient moving all extremities  Post vital signs: Reviewed and stable  Last Vitals:  Vitals:   02/05/17 1301 02/05/17 1319  BP: 119/72   Pulse:  (!) 116  Resp:  17  Temp:  36.3 C    Last Pain:  Vitals:   02/05/17 1319  TempSrc: Oral  PainSc:          Complications: No apparent anesthesia complications

## 2017-02-05 NOTE — H&P (View-Only) (Signed)
SUBJECTIVE: The patient is doing well today.  At this time, he denies chest pain, shortness of breath, or any new concerns.  Marland Kitchen apixaban  5 mg Oral BID  . diltiazem  60 mg Oral Q6H  . insulin aspart  0-9 Units Subcutaneous TID WC  . isosorbide mononitrate  30 mg Oral Daily  . niacin  1,000 mg Oral QHS  . omega-3 acid ethyl esters  2 capsule Oral Daily  . pregabalin  200 mg Oral TID  . ramipril  10 mg Oral Daily  . ranolazine  500 mg Oral BID  . rosuvastatin  40 mg Oral QHS  . tapentadol  50 mg Oral Q8H   . sodium chloride 50 mL/hr at 02/05/17 0627    OBJECTIVE: Physical Exam: Vitals:   02/04/17 1418 02/04/17 1734 02/04/17 2031 02/05/17 0615  BP: (!) 111/56 112/84 121/72 111/65  Pulse: (!) 102  (!) 118 (!) 114  Resp:      Temp: 97.9 F (36.6 C)  98 F (36.7 C) 97.8 F (36.6 C)  TempSrc:   Oral Oral  SpO2: 98%  98% 97%  Weight:    205 lb 1.6 oz (93 kg)  Height:        Intake/Output Summary (Last 24 hours) at 02/05/17 0646 Last data filed at 02/04/17 0900  Gross per 24 hour  Intake                0 ml  Output                0 ml  Net                0 ml    Telemetry is reviewed by myself: AFlutter, generally well controlled rates, 70's  GEN- The patient is well appearing, alert and oriented x 3 today.   Head- normocephalic, atraumatic Eyes-  Sclera clear, conjunctiva pink Ears- hearing intact Oropharynx- clear Neck- supple, no JVP Lungs- CTA b/l, normal work of breathing Heart-  IRRR, no significant murmurs, no rubs or gallops GI- soft, NT, ND Extremities- no clubbing, cyanosis,  or edema Skin- no rash or lesion Psych- euthymic mood, full affect Neuro- no gross deficits appreciated  LABS: Basic Metabolic Panel:  Recent Labs  02/02/17 1533 02/03/17 0912  NA 133* 138  K 4.1 3.9  CL 102 106  CO2 24 25  GLUCOSE 116* 121*  BUN 11 8  CREATININE 0.84 0.83  CALCIUM 8.6* 8.5*  MG 2.0  --    CBC:  Recent Labs  02/02/17 1533 02/03/17 0912  WBC  8.2 6.4  NEUTROABS 6.2  --   HGB 13.6 13.0  HCT 39.6 38.8*  MCV 92.1 91.7  PLT 146* 131*   Hemoglobin A1C:  Recent Labs  02/03/17 0912  HGBA1C 5.5   Thyroid Function Tests:  Recent Labs  02/02/17 1606  TSH 1.642    02/04/17: TEE Left ventricle: 50-55% Aortic valve: mild regurgitation. - Mitral valve: There was mild regurgitation. - Left atrium: The atrium was mildly dilated. No thrombus in the left atrium or left atrial appendage. Normal emptying and filling velocities.  -Tricuspid valve: Mld regurgitation   ASSESSMENT AND PLAN:  1. Atrial flutter, typical --new onset but unclear duration. --Rate controlled currently on diltiazem PO --Apixaban started 5/12 (ASA and Plavix stopped)    CHA2DS2Vasc is at least 3 (patient denies dx of [place]), started and maintained on Eliquis here without interruption   TEE yesterday without thrombus, EF 50-55%  planned for EPS/ablation today with [name].  2. CAD s/p CABG and RCA stents (remote) --stable. No s/s angina. --off ASA & Plavix due to Apixaban --Continue Ranexa and statin   3. DM2 --oral meds on hold currently --Covering with SSI --Continue statin and ACE  4. HTN     No changes     Patient denies this diagnosis  Tommye Standard, PA-C 02/05/2017 6:46 AM  I have seen, examined the patient, and reviewed the above assessment and plan.  Changes to above are made where necessary.  Therapeutic strategies for atrial flutter including medicine and ablation were discussed in detail with the patient today. Risk, benefits, and alternatives to EP study and radiofrequency ablation were also discussed in detail today. These risks include but are not limited to stroke, bleeding, vascular damage, tamponade, perforation, damage to the heart and other structures, AV block requiring pacemaker, worsening renal function, and death. The patient understands these risk and wishes to proceed.    Co Sign: Thompson Grayer, MD 02/05/2017 11:19  AM

## 2017-02-06 ENCOUNTER — Encounter: Payer: Self-pay | Admitting: Physician Assistant

## 2017-02-06 ENCOUNTER — Encounter (HOSPITAL_COMMUNITY): Payer: Self-pay | Admitting: Internal Medicine

## 2017-02-06 LAB — GLUCOSE, CAPILLARY: Glucose-Capillary: 115 mg/dL — ABNORMAL HIGH (ref 65–99)

## 2017-02-06 MED ORDER — CARVEDILOL 6.25 MG PO TABS: 6.2500 mg | ORAL_TABLET | Freq: Two times a day (BID) | ORAL | 3 refills | Status: DC

## 2017-02-06 MED ORDER — APIXABAN 5 MG PO TABS: 5.0000 mg | ORAL_TABLET | Freq: Two times a day (BID) | ORAL | 1 refills | Status: DC

## 2017-02-06 NOTE — Plan of Care (Signed)
Problem: Activity: Goal: Risk for activity intolerance will decrease Outcome: Progressing Patient ambulates in hall

## 2017-02-06 NOTE — Discharge Instructions (Signed)
°  Post procedure activity and wound instructions No driving for 4 days. No lifting over 5 lbs for 1 week. No vigorous or sexual activity for 1 week. You may return to work on 02/13/17. Keep procedure site clean & dry. If you notice increased pain, swelling, bleeding or pus, call/return!  You may shower, but no soaking baths/hot tubs/pools for 1 week.    ________________________________________________________________  Information on my medicine - ELIQUIS (apixaban)  This medication education was reviewed with me or my healthcare representative as part of my discharge preparation.    Why was Eliquis prescribed for you? Eliquis was prescribed for you to reduce the risk of a blood clot forming that can cause a stroke if you have a medical condition called atrial fibrillation (a type of irregular heartbeat).  What do You need to know about Eliquis ? Take your Eliquis TWICE DAILY - one tablet in the morning and one tablet in the evening with or without food. If you have difficulty swallowing the tablet whole please discuss with your pharmacist how to take the medication safely.  Take Eliquis exactly as prescribed by your doctor and DO NOT stop taking Eliquis without talking to the doctor who prescribed the medication.  Stopping may increase your risk of developing a stroke.  Refill your prescription before you run out.  After discharge, you should have regular check-up appointments with your healthcare provider that is prescribing your Eliquis.  In the future your dose may need to be changed if your kidney function or weight changes by a significant amount or as you get older.  What do you do if you miss a dose? If you miss a dose, take it as soon as you remember on the same day and resume taking twice daily.  Do not take more than one dose of ELIQUIS at the same time to make up a missed dose.  Important Safety Information A possible side effect of Eliquis is bleeding. You should call your  healthcare provider right away if you experience any of the following: ? Bleeding from an injury or your nose that does not stop. ? Unusual colored urine (red or dark brown) or unusual colored stools (red or black). ? Unusual bruising for unknown reasons. ? A serious fall or if you hit your head (even if there is no bleeding).  Some medicines may interact with Eliquis and might increase your risk of bleeding or clotting while on Eliquis. To help avoid this, consult your healthcare provider or pharmacist prior to using any new prescription or non-prescription medications, including herbals, vitamins, non-steroidal anti-inflammatory drugs (NSAIDs) and supplements.  This website has more information on Eliquis (apixaban): http://www.eliquis.com/eliquis/home

## 2017-02-06 NOTE — Discharge Summary (Signed)
ELECTROPHYSIOLOGY PROCEDURE DISCHARGE SUMMARY    Patient ID: Ruben [name],  MRN: [id number], DOB/AGE: 70-Dec-1947 70 y.o.  Admit date: 02/02/2017 Discharge date: 02/06/2017  Primary Care Physician: Lujean Amel, MD  Primary Cardiologist: Dr. Claiborne Billings Electrophysiologist: new, [name]  Primary Discharge Diagnosis:  1. New onset typical AFlutter w/RVR     CHA2DS2Vasc is at least 3, on Eliquis  Secondary Discharge Diagnosis:  1. CAD 2. HTN 3. HLD 4. DM  Allergies  Allergen Reactions  . Cymbalta [Duloxetine Hcl] Diarrhea     Procedures This Admission: 1. 02/04/17: TEE, Dr. Meda Coffee    Left ventricle: 50-55% Aortic valve: mild regurgitation. - Mitral valve: There was mild regurgitation. - Left atrium: The atrium was mildly dilated. No thrombus in the left atrium or left atrial appendage. Normal emptying and filling velocities.  -Tricuspid valve: Mld regurgitation      2.  Electrophysiology study and radiofrequency catheter ablation on 02/05/17 by Dr Rayann Heman.   This study demonstrated typical atrial flutter with successful CTI ablation.   There were no inducible arrhythmias following ablation and no early apparent complications.    Brief HPI: Ruben [name] is a 71 y.o. male with a past medical history as outlined above.  He presented to [facility] ER 02/02/17 with persistent fast HR and c/o unusual fatigue, he was noted to be tachycardic, given IV adenosine and was AFlutter w/RVR, was transferred to [facility] Course:  The patient was admitted and started on Eliquis for a/c and diltiazem for rate control, after discussion was decided that he have TEE followed by ablation procedure.  He had TEE 02/04/17 noted above and underwent EPS/RFCA with details as outlined above. He was monitored on telemetry overnight which demonstrated SB/SR 50's-60's 1st degree AVblock.  R Groin/procedure site was without complication.  The patient was examined by Dr Rayann Heman who considered him stable for  discharge to home.  Follow up will be arranged in 4 weeks.  Wound care and restrictions were reviewed with the patient prior to discharge.   I will provide the patient with a note given he had to cancel travel/flights this week.  Physical Exam: Vitals:   02/05/17 1544 02/05/17 2149 02/06/17 0542 02/06/17 0858  BP:  129/73 (!) 108/48 114/69  Pulse:  66 (!) 59   Resp: 13  18   Temp:  98.4 F (36.9 C) 98.7 F (37.1 C)   TempSrc:  Oral Oral   SpO2: 95% 98% 97%   Weight:   207 lb 8 oz (94.1 kg)   Height:        GEN- The patient is well appearing, alert and oriented x 3 today.   HEENT: normocephalic, atraumatic; sclera clear, conjunctiva pink; hearing intact; oropharynx clear; neck supple, no JVP Lymph- no cervical lymphadenopathy Lungs- CTA b/l, normal work of breathing.  No wheezes, rales, rhonchi Heart- RRR, no murmurs, rubs or gallops, PMI not laterally displaced GI- soft, non-tender, non-distended Extremities- no clubbing, cyanosis, or edema; DP/PT/radial pulses 2+ bilaterally, R groin without hematoma/bruit MS- no significant deformity or atrophy Skin- warm and dry, no rash or lesion Psych- euthymic mood, full affect Neuro- strength and sensation are intact   Labs:   Lab Results  Component Value Date   WBC 6.4 02/03/2017   HGB 13.0 02/03/2017   HCT 38.8 (L) 02/03/2017   MCV 91.7 02/03/2017   PLT 131 (L) 02/03/2017     Recent Labs Lab 02/03/17 0912  NA 138  K 3.9  CL  106  CO2 25  BUN 8  CREATININE 0.83  CALCIUM 8.5*  GLUCOSE 121*    Discharge Medications:  Allergies as of 02/06/2017      Reactions   Cymbalta [duloxetine Hcl] Diarrhea      Medication List    STOP taking these medications   aspirin EC 81 MG tablet   clopidogrel 75 MG tablet Commonly known as:  PLAVIX     TAKE these medications   apixaban 5 MG Tabs tablet Commonly known as:  ELIQUIS Take 1 tablet (5 mg total) by mouth 2 (two) times daily.   carvedilol 6.25 MG tablet Commonly known  as:  COREG Take 1 tablet (6.25 mg total) by mouth 2 (two) times daily with a meal.   fluticasone 50 MCG/ACT nasal spray Commonly known as:  FLONASE Place 1 spray into both nostrils daily as needed for allergies or rhinitis.   isosorbide mononitrate 30 MG 24 hr tablet Commonly known as:  IMDUR Take 1 tablet (30 mg total) by mouth daily.   loratadine 10 MG tablet Commonly known as:  CLARITIN Take 10 mg by mouth daily as needed for allergies.   METANX PO Take 1 tablet by mouth 2 (two) times daily. Notes to patient:  You did not receive this drug during your hospitalization.   niacin 1000 MG CR tablet Commonly known as:  NIASPAN Take 1 tablet (1,000 mg total) by mouth at bedtime.   nitroGLYCERIN 0.4 MG/SPRAY spray Commonly known as:  NITROLINGUAL Place 1 spray under the tongue every 5 (five) minutes x 3 doses as needed for chest pain.   omega-3 acid ethyl esters 1 g capsule Commonly known as:  LOVAZA Take 2 capsules (2 g total) by mouth daily.   ONGLYZA 5 MG Tabs tablet Generic drug:  saxagliptin HCl Take 5 mg by mouth daily. Notes to patient:  You did not receive this drug during your hospitalization.   oxyCODONE-acetaminophen 5-325 MG tablet Commonly known as:  PERCOCET/ROXICET Take 1 tablet by mouth daily as needed for moderate pain.   pioglitazone 45 MG tablet Commonly known as:  ACTOS Take 45 mg by mouth daily. Notes to patient:  You did not receive this medication during your hospitalization.   pregabalin 200 MG capsule Commonly known as:  LYRICA Take 200 mg by mouth 3 (three) times daily.   ramipril 10 MG capsule Commonly known as:  ALTACE TAKE 1 CAPSULE BY MOUTH DAILY. PLEASE SCHEDULE APPOINTMENT FOR REFILLS   ranolazine 1000 MG SR tablet Commonly known as:  RANEXA TAKE 1 TABLET(1000 MG) BY MOUTH TWICE DAILY   rosuvastatin 40 MG tablet Commonly known as:  CRESTOR Take 1 tablet (40 mg total) by mouth daily.   Tapentadol HCl 150 MG Tb12 Take 150 mg by  mouth daily.       Disposition:  home  Follow-up Information    Thompson Grayer, MD Follow up on 03/07/2017.   Specialty:  Cardiology Why:  8:45AM Contact information: [place] [address] [phone]           Duration of Discharge Encounter: Greater than 30 minutes including physician time.  Venetia Night, PA-C 02/06/2017 2:43 PM

## 2017-02-06 NOTE — Care Management (Signed)
1205 02-06-17 CM did provide Staff RN with [place] will have to make pt aware of co pay once he gets first month free. No further needs from CM at this time. Bethena Roys, RN,BSN [phone]

## 2017-02-11 DIAGNOSIS — J069 Acute upper respiratory infection, unspecified: Secondary | ICD-10-CM | POA: Diagnosis not present

## 2017-02-20 ENCOUNTER — Telehealth: Payer: Self-pay | Admitting: Cardiovascular Disease

## 2017-02-20 NOTE — Telephone Encounter (Signed)
Returned call to patient-reports he has had a dry, hacking cough x 6 months and believes this is from the ramipril.  Reports he went to PCP for cough, was given tessalon which helped but it continued once he stopped taking it. Wondering if there is an alternative he could take in place to help get rid of the cough.  Advised I would route to Dr. Claiborne Billings for recommendations.  Patient also wanted to make Dr. Claiborne Billings aware he has a follow up appt with [name] on June 14th post ablation.

## 2017-02-20 NOTE — Telephone Encounter (Signed)
Patient calling states that he is experiencing a side effect with Ramipril medication. Patient has a "dry, hacking" cough in the middle of the night. Please call to discuss an alternative.

## 2017-02-25 NOTE — Telephone Encounter (Signed)
Deferred to Dr Claiborne Billings for review and recommendation.

## 2017-02-26 DIAGNOSIS — M216X1 Other acquired deformities of right foot: Secondary | ICD-10-CM | POA: Diagnosis not present

## 2017-02-26 DIAGNOSIS — E1351 Other specified diabetes mellitus with diabetic peripheral angiopathy without gangrene: Secondary | ICD-10-CM | POA: Diagnosis not present

## 2017-02-26 DIAGNOSIS — E1140 Type 2 diabetes mellitus with diabetic neuropathy, unspecified: Secondary | ICD-10-CM | POA: Diagnosis not present

## 2017-02-28 ENCOUNTER — Telehealth: Payer: Self-pay | Admitting: Internal Medicine

## 2017-02-28 NOTE — Telephone Encounter (Signed)
New message      What dental office are you calling from?  [facility] What is your office phone and fax number?  Fax [phone] What type of procedure is the patient having performed?  Regular cleaning 1. What date is procedure scheduled? In pt chair now  2. What is your question (ex. Antibiotics prior to procedure, holding medication-we need to know how long dentist wants pt to hold med)? Pt had an ablation in may and is in the dentist chair now for a regular cleaning.  He is also on eliquis.  Calling to get clearance

## 2017-02-28 NOTE — Telephone Encounter (Signed)
Returned call to Tori-advised per protocol do not hold NOAC for routine dental cleaning.   Aware and verbalized understanding

## 2017-03-04 ENCOUNTER — Other Ambulatory Visit: Payer: Self-pay

## 2017-03-06 DIAGNOSIS — M9904 Segmental and somatic dysfunction of sacral region: Secondary | ICD-10-CM | POA: Diagnosis not present

## 2017-03-06 DIAGNOSIS — M9902 Segmental and somatic dysfunction of thoracic region: Secondary | ICD-10-CM | POA: Diagnosis not present

## 2017-03-06 DIAGNOSIS — M5030 Other cervical disc degeneration, unspecified cervical region: Secondary | ICD-10-CM | POA: Diagnosis not present

## 2017-03-06 DIAGNOSIS — M9901 Segmental and somatic dysfunction of cervical region: Secondary | ICD-10-CM | POA: Diagnosis not present

## 2017-03-06 DIAGNOSIS — M9903 Segmental and somatic dysfunction of lumbar region: Secondary | ICD-10-CM | POA: Diagnosis not present

## 2017-03-06 DIAGNOSIS — M5136 Other intervertebral disc degeneration, lumbar region: Secondary | ICD-10-CM | POA: Diagnosis not present

## 2017-03-07 ENCOUNTER — Encounter: Payer: Self-pay | Admitting: Internal Medicine

## 2017-03-07 ENCOUNTER — Ambulatory Visit (INDEPENDENT_AMBULATORY_CARE_PROVIDER_SITE_OTHER): Payer: Medicare Other | Admitting: Internal Medicine

## 2017-03-07 VITALS — BP 106/60 | HR 57 | Ht 70.0 in | Wt 203.0 lb

## 2017-03-07 DIAGNOSIS — I10 Essential (primary) hypertension: Secondary | ICD-10-CM

## 2017-03-07 DIAGNOSIS — I4892 Unspecified atrial flutter: Secondary | ICD-10-CM

## 2017-03-07 DIAGNOSIS — I2510 Atherosclerotic heart disease of native coronary artery without angina pectoris: Secondary | ICD-10-CM | POA: Diagnosis not present

## 2017-03-07 DIAGNOSIS — I483 Typical atrial flutter: Secondary | ICD-10-CM | POA: Diagnosis not present

## 2017-03-07 MED ORDER — LOSARTAN POTASSIUM 25 MG PO TABS: 25.0000 mg | ORAL_TABLET | Freq: Every day | ORAL | 3 refills | Status: DC

## 2017-03-07 MED ORDER — ASPIRIN EC 81 MG PO TBEC: 81.0000 mg | DELAYED_RELEASE_TABLET | Freq: Every day | ORAL | 3 refills | Status: DC

## 2017-03-07 NOTE — Patient Instructions (Signed)
Medication Instructions:  Your physician has recommended you make the following change in your medication:  1) Stop Eliquis 2) Start Aspirin 81 mg 3) Stop Lisinopril  4) Start Losartan 25 mg daily   Labwork: None ordered   Testing/Procedures: None ordered   Follow-Up: Your physician recommends that you schedule a follow-up appointment in: 3 months Dr Claiborne Billings   Any Other Special Instructions Will Be Listed Below (If Applicable).     If you need a refill on your cardiac medications before your next appointment, please call your pharmacy.

## 2017-03-07 NOTE — Progress Notes (Signed)
PCP: Lujean Amel, MD Primary Cardiologist: Dr Janie Morning is a 71 y.o. male who presents today for routine electrophysiology followup.  Since his recent atrial flutter ablation, the patient reports doing very well.  he denies procedure related complications and is pleased with the results of the procedure.   He has noticed nocturnal cough for some time.  He is worried that it is related to lisinopril.  Today, he denies symptoms of palpitations, chest pain, shortness of breath,  lower extremity edema, dizziness, presyncope, or syncope.  The patient is otherwise without complaint today.   Past Medical History:  Diagnosis Date  . Atrial flutter ([place]) 02/02/2017  . CAD, multiple vessel   . Diabetes mellitus ([place])   . Dyslipidemia   . Hypertension    Past Surgical History:  Procedure Laterality Date  . A-FLUTTER ABLATION N/A 02/05/2017   Procedure: A-Flutter Ablation;  Surgeon: Thompson Grayer, MD;  Location: [place] [facility];  Service: Cardiovascular;  Laterality: N/A;  . CARDIAC CATHETERIZATION  12/13/2011   No intervention - recommend medical therapy with increased medical trial  . CARDIOVASCULAR STRESS TEST  10/26/2010   No scintigraphic evidence of inducible myocardial ischemia. No ECG changes. EKG negative for ischemia.  . CORONARY ARTERY BYPASS GRAFT  1997   LIMA-LAD, vein graft to diagonal, vein graft to obtuse marginal, and vein graft to RCA  . LEFT HEART CATHETERIZATION WITH CORONARY/GRAFT ANGIOGRAM N/A 12/13/2011   Procedure: LEFT HEART CATHETERIZATION WITH Beatrix Fetters;  Surgeon: Troy Sine, MD;  Location: [facility] CATH LAB;  Service: Cardiovascular;  Laterality: N/A;  . LOWER EXTREMITY ARTERIAL DOPPLER  01/30/2000   Normal arterial study  . TEE WITHOUT CARDIOVERSION N/A 02/04/2017   Procedure: TRANSESOPHAGEAL ECHOCARDIOGRAM (TEE);  Surgeon: Dorothy Spark, MD;  Location: [facility] ENDOSCOPY;  Service: Cardiovascular;  Laterality: N/A;  . TRANSTHORACIC  ECHOCARDIOGRAM  12/12/2011   EF >55%, LA severely dilated.    ROS- all systems are personally reviewed and negatives except as per HPI above  Current Outpatient Prescriptions  Medication Sig Dispense Refill  . apixaban (ELIQUIS) 5 MG TABS tablet Take 1 tablet (5 mg total) by mouth 2 (two) times daily. 60 tablet 1  . carvedilol (COREG) 6.25 MG tablet Take 1 tablet (6.25 mg total) by mouth 2 (two) times daily with a meal. 60 tablet 3  . isosorbide mononitrate (IMDUR) 30 MG 24 hr tablet Take 1 tablet (30 mg total) by mouth daily. 90 tablet 3  . L-Methylfolate-B6-B12 (METANX PO) Take 1 tablet by mouth 2 (two) times daily.     . niacin (NIASPAN) 1000 MG CR tablet Take 1 tablet (1,000 mg total) by mouth at bedtime. 90 tablet 3  . nitroGLYCERIN (NITROLINGUAL) 0.4 MG/SPRAY spray Place 1 spray under the tongue every 5 (five) minutes x 3 doses as needed for chest pain. 12 g 11  . omega-3 acid ethyl esters (LOVAZA) 1 g capsule Take 2 capsules (2 g total) by mouth daily. 90 capsule 3  . oxyCODONE-acetaminophen (PERCOCET/ROXICET) 5-325 MG tablet Take 1 tablet by mouth daily as needed for moderate pain.     . pioglitazone (ACTOS) 45 MG tablet Take 45 mg by mouth daily.    . pregabalin (LYRICA) 200 MG capsule Take 200 mg by mouth 3 (three) times daily.     . ramipril (ALTACE) 10 MG capsule TAKE 1 CAPSULE BY MOUTH DAILY. PLEASE SCHEDULE APPOINTMENT FOR REFILLS 90 capsule 3  . ranolazine (RANEXA) 1000 MG SR tablet TAKE 1 TABLET(1000  MG) BY MOUTH TWICE DAILY 180 tablet 3  . rosuvastatin (CRESTOR) 40 MG tablet Take 1 tablet (40 mg total) by mouth daily. 90 tablet 3  . saxagliptin HCl (ONGLYZA) 5 MG TABS tablet Take 5 mg by mouth daily.    . Tapentadol HCl 150 MG TB12 Take 2 tablets (300 mg) by mouth daily     No current facility-administered medications for this visit.     Physical Exam: Vitals:   03/07/17 0838  BP: 106/60  Pulse: (!) 57  SpO2: 98%  Weight: 203 lb (92.1 kg)  Height: 5\' 10"  (1.778 m)     GEN- The patient is well appearing, alert and oriented x 3 today.   Head- normocephalic, atraumatic Eyes-  Sclera clear, conjunctiva pink Ears- hearing intact Oropharynx- clear Lungs- Clear to ausculation bilaterally, normal work of breathing Heart- Regular rate and rhythm, no murmurs, rubs or gallops, PMI not laterally displaced GI- soft, NT, ND, + BS Extremities- no clubbing, cyanosis, or edema  EKG tracing ordered today is personally reviewed and shows sinus rhythm 55 bpm, PR 248 msec, incomplete RBBB, nonspecific ST/T changes  Assessment and Plan:  1. Isthmus dependant right atrial flutter Doing well s/p ablation Stop eliquis Resume ASA 81mg  daily  2. CAD No ischemic symptoms Stop eliquis and resume ASA Dr Claiborne Billings to decide if he should take plavix again (staff message sent requesting his input)  3. HTN Stable Given cough, he would like to stop lisinopril Will therefore stop lisinopril and start losartan 25mg  daily  Follow-up with Dr Claiborne Billings I will see as needed going forward  Thompson Grayer MD, [facility] 03/07/2017 8:50 AM

## 2017-03-14 NOTE — Telephone Encounter (Signed)
From a peripheral may be contributing to the dry hacking cough.  Since he has been on 10 mg, would initially changed to valsartan 160 mg daily.

## 2017-03-15 DIAGNOSIS — E1140 Type 2 diabetes mellitus with diabetic neuropathy, unspecified: Secondary | ICD-10-CM | POA: Diagnosis not present

## 2017-03-15 DIAGNOSIS — M216X1 Other acquired deformities of right foot: Secondary | ICD-10-CM | POA: Diagnosis not present

## 2017-03-15 DIAGNOSIS — E1351 Other specified diabetes mellitus with diabetic peripheral angiopathy without gangrene: Secondary | ICD-10-CM | POA: Diagnosis not present

## 2017-03-15 NOTE — Telephone Encounter (Signed)
Noted-med changes made at OV with [name] 6/14.

## 2017-04-01 ENCOUNTER — Other Ambulatory Visit: Payer: Self-pay | Admitting: Cardiovascular Disease

## 2017-04-04 DIAGNOSIS — M216X2 Other acquired deformities of left foot: Secondary | ICD-10-CM | POA: Diagnosis not present

## 2017-04-04 DIAGNOSIS — M216X1 Other acquired deformities of right foot: Secondary | ICD-10-CM | POA: Diagnosis not present

## 2017-04-25 DIAGNOSIS — G609 Hereditary and idiopathic neuropathy, unspecified: Secondary | ICD-10-CM | POA: Diagnosis not present

## 2017-04-25 DIAGNOSIS — G894 Chronic pain syndrome: Secondary | ICD-10-CM | POA: Diagnosis not present

## 2017-04-25 DIAGNOSIS — Z79899 Other long term (current) drug therapy: Secondary | ICD-10-CM | POA: Diagnosis not present

## 2017-04-25 DIAGNOSIS — Z5181 Encounter for therapeutic drug level monitoring: Secondary | ICD-10-CM | POA: Diagnosis not present

## 2017-05-31 DIAGNOSIS — H43813 Vitreous degeneration, bilateral: Secondary | ICD-10-CM | POA: Diagnosis not present

## 2017-05-31 DIAGNOSIS — H52203 Unspecified astigmatism, bilateral: Secondary | ICD-10-CM | POA: Diagnosis not present

## 2017-05-31 DIAGNOSIS — H2513 Age-related nuclear cataract, bilateral: Secondary | ICD-10-CM | POA: Diagnosis not present

## 2017-05-31 DIAGNOSIS — E119 Type 2 diabetes mellitus without complications: Secondary | ICD-10-CM | POA: Diagnosis not present

## 2017-06-18 ENCOUNTER — Encounter: Payer: Self-pay | Admitting: Cardiovascular Disease

## 2017-06-18 ENCOUNTER — Ambulatory Visit (INDEPENDENT_AMBULATORY_CARE_PROVIDER_SITE_OTHER): Payer: Medicare Other | Admitting: Cardiovascular Disease

## 2017-06-18 VITALS — BP 115/64 | HR 52 | Ht 70.0 in | Wt 206.4 lb

## 2017-06-18 DIAGNOSIS — E1140 Type 2 diabetes mellitus with diabetic neuropathy, unspecified: Secondary | ICD-10-CM | POA: Diagnosis not present

## 2017-06-18 DIAGNOSIS — I2510 Atherosclerotic heart disease of native coronary artery without angina pectoris: Secondary | ICD-10-CM

## 2017-06-18 DIAGNOSIS — Z79899 Other long term (current) drug therapy: Secondary | ICD-10-CM

## 2017-06-18 DIAGNOSIS — I10 Essential (primary) hypertension: Secondary | ICD-10-CM | POA: Diagnosis not present

## 2017-06-18 DIAGNOSIS — E782 Mixed hyperlipidemia: Secondary | ICD-10-CM | POA: Diagnosis not present

## 2017-06-18 DIAGNOSIS — I4892 Unspecified atrial flutter: Secondary | ICD-10-CM | POA: Diagnosis not present

## 2017-06-18 MED ORDER — ROSUVASTATIN CALCIUM 40 MG PO TABS: 40.0000 mg | ORAL_TABLET | Freq: Every day | ORAL | 3 refills | Status: DC

## 2017-06-18 MED ORDER — LOSARTAN POTASSIUM 25 MG PO TABS: 25.0000 mg | ORAL_TABLET | Freq: Every day | ORAL | 3 refills | Status: DC

## 2017-06-18 MED ORDER — ISOSORBIDE MONONITRATE ER 30 MG PO TB24: 30.0000 mg | ORAL_TABLET | Freq: Every day | ORAL | 3 refills | Status: DC

## 2017-06-18 MED ORDER — CARVEDILOL 6.25 MG PO TABS: 6.2500 mg | ORAL_TABLET | Freq: Two times a day (BID) | ORAL | 3 refills | Status: DC

## 2017-06-18 MED ORDER — CLOPIDOGREL BISULFATE 75 MG PO TABS: 75.0000 mg | ORAL_TABLET | Freq: Every day | ORAL | 3 refills | Status: DC

## 2017-06-18 MED ORDER — NIACIN ER (ANTIHYPERLIPIDEMIC) 1000 MG PO TBCR: 1000.0000 mg | EXTENDED_RELEASE_TABLET | Freq: Every day | ORAL | 3 refills | Status: DC

## 2017-06-18 MED ORDER — RANOLAZINE ER 1000 MG PO TB12: ORAL_TABLET | ORAL | 3 refills | Status: DC

## 2017-06-18 NOTE — Progress Notes (Signed)
Patient ID: Ruben [name], male   DOB: [birth year], 71 y.o.   MRN: [id number]    PCP: Dr. Dagmar Hait  HPI: Ruben [name] is a 71 y.o. male who presents for a 10 month cardiology followup evaluation.   Ruben [name] has established CAD and in 1997 underwent CABG surgery with a LIMA to the LAD, vein to the diagonal, vein to the obtuse marginal, and vein to the right coronary artery. In November 2004 he underwent stenting to the vein graft to the circumflex vessel. In December 2009 the graft to the circumflex was occluded and he underwent stenting of the left main into the superior ramus intermediate vessel. At that time he also underwent stenting of the vein graft to the right coronary artery. His last catheterization which was done for repeat chest pain in March 2013 showed patent stents. He has known occlusion of the graft that supplied the circumflex marginal vessel. The graft to diagonal vessel was patent and filled the proximal LAD system and the LIMA graft which filled the mid LAD system was widely patent.  He has ahistory of mixed hyperlipidemia and has been on aggressive lipid-lowering therapy over the past 18 years. He also has developed a peripheral neuropathy. He has mild diabetes mellitus and is now followed by [name]. In April 2014 which labs showed a total cholesterol was 125, triglycerides 52, HDL 55, LDL 52 on aggressive combination therapy consisting of Niaspan 2 g daily, Crestor 40 mg daily, Lovaza 2 g daily.  Initially, we had followed him with [facility] heart lab assessments.  On 12/08/2013 a followup nuclear perfusion study continued to show normal perfusion without scar or ischemia on his current medical therapy.  Ejection fraction was 64%.  He is retired for several years and with  more time he exercises regularly and walks up to 5 miles per day approximately 4-5 days per week and does yoga 3 days per week.  He denies any recurrent anginal symptoms.  He denies any shortness of breath.   He denies PND, orthopnea.  He denies palpitations.   Laboratory in May 2015 showed significant aggressive lipid management with an LDL particle number now at 371 and calculated LDL 43, HDL 57, triglycerides 37, total cholesterol 107.  Small LDL particles of 156.  He still had an increased VLDL size.  Insulin resistance score was less than 25.  At that time, I recommended he reduce his Niaspan from 2000 mg to 1000 mg a discussed the possibility of discontinuing this altogether in the future depending upon subsequent levels.  In March 2017 he underwent a repeat echo Doppler study which showed mild LVH with normal systolic function without wall motion abnormalities.  There was very mild aortic regurgitation.  His aortic root dimension measured 39 mm.  His left atrium was mildly dilated.  Pulmonary pressures were normal.  He also underwent a repeat lipoprofile which showed an LDL particle #856.  He had 136 small LDL particles was normal.  Total cholesterol was 127, HDL 55, triglycerides 57, and LDL 61.  Apo lipoprotein B was 54 and LPa was 66, all excellent.  Since I last saw him 10 months ago, he has been without anginal symptomatology.  Because of a stent in his left main, I had continued him on long-term Plavix with aspirin therapy.  In May 2018.  He apparently developed a new onset atrial flutter.  He was hospitalized.  A TEE showed mild dilation of his atrium without thrombus.  He underwent atrial  flutter ablation by [name] and was found to have isthmus dependent right atrial flutter.  He was started on eliquis doing his presentation.  He saw [name] in follow-up on 03/07/2017 and eliquis was discontinued at that time and he was told to resume aspirin.  He was questioning whether or not to take Plavix again.  He also had an ACE-induced cough and lisinopril was discontinued and he was started on low-dose losartan.  He presents now for follow-up cardiology evaluation.  He has continued to be on embolize a  and Actos for his diabetes mellitus.  He has continued to be on aggressive combination therapy for his mixed hyperlipidemia.  Past Surgical History:  Procedure Laterality Date  . A-FLUTTER ABLATION N/A 02/05/2017   Procedure: A-Flutter Ablation;  Surgeon: Thompson Grayer, MD;  Location: [place] [facility];  Service: Cardiovascular;  Laterality: N/A;  . CARDIAC CATHETERIZATION  12/13/2011   No intervention - recommend medical therapy with increased medical trial  . CARDIOVASCULAR STRESS TEST  10/26/2010   No scintigraphic evidence of inducible myocardial ischemia. No ECG changes. EKG negative for ischemia.  . CORONARY ARTERY BYPASS GRAFT  1997   LIMA-LAD, vein graft to diagonal, vein graft to obtuse marginal, and vein graft to RCA  . LEFT HEART CATHETERIZATION WITH CORONARY/GRAFT ANGIOGRAM N/A 12/13/2011   Procedure: LEFT HEART CATHETERIZATION WITH Beatrix Fetters;  Surgeon: Troy Sine, MD;  Location: [facility] CATH LAB;  Service: Cardiovascular;  Laterality: N/A;  . LOWER EXTREMITY ARTERIAL DOPPLER  01/30/2000   Normal arterial study  . TEE WITHOUT CARDIOVERSION N/A 02/04/2017   Procedure: TRANSESOPHAGEAL ECHOCARDIOGRAM (TEE);  Surgeon: Dorothy Spark, MD;  Location: [facility] ENDOSCOPY;  Service: Cardiovascular;  Laterality: N/A;  . TRANSTHORACIC ECHOCARDIOGRAM  12/12/2011   EF >55%, LA severely dilated.    Allergies  Allergen Reactions  . Cymbalta [Duloxetine Hcl] Diarrhea    Current Outpatient Prescriptions  Medication Sig Dispense Refill  . aspirin EC 81 MG tablet Take 1 tablet (81 mg total) by mouth daily. 90 tablet 3  . carvedilol (COREG) 6.25 MG tablet Take 1 tablet (6.25 mg total) by mouth 2 (two) times daily with a meal. 60 tablet 3  . isosorbide mononitrate (IMDUR) 30 MG 24 hr tablet Take 1 tablet (30 mg total) by mouth daily. 90 tablet 3  . L-Methylfolate-B6-B12 (METANX PO) Take 1 tablet by mouth 2 (two) times daily.     Marland Kitchen losartan (COZAAR) 25 MG tablet Take 1 tablet (25 mg total)  by mouth daily. 90 tablet 3  . niacin (NIASPAN) 1000 MG CR tablet Take 1 tablet (1,000 mg total) by mouth at bedtime. 90 tablet 3  . nitroGLYCERIN (NITROLINGUAL) 0.4 MG/SPRAY spray Place 1 spray under the tongue every 5 (five) minutes x 3 doses as needed for chest pain. 12 g 11  . omega-3 acid ethyl esters (LOVAZA) 1 g capsule TAKE 2 CAPSULES BY MOUTH DAILY 60 capsule 3  . oxyCODONE-acetaminophen (PERCOCET/ROXICET) 5-325 MG tablet Take 1 tablet by mouth daily as needed for moderate pain.     . pioglitazone (ACTOS) 45 MG tablet Take 45 mg by mouth daily.    . pregabalin (LYRICA) 200 MG capsule Take 200 mg by mouth 3 (three) times daily.     . ranolazine (RANEXA) 1000 MG SR tablet TAKE 1 TABLET(1000 MG) BY MOUTH TWICE DAILY 180 tablet 3  . rosuvastatin (CRESTOR) 40 MG tablet Take 1 tablet (40 mg total) by mouth daily. 90 tablet 3  . saxagliptin HCl (ONGLYZA)  5 MG TABS tablet Take 5 mg by mouth daily.    . Tapentadol HCl 150 MG TB12 Take 2 tablets (300 mg) by mouth daily    . clopidogrel (PLAVIX) 75 MG tablet Take 1 tablet (75 mg total) by mouth daily. 90 tablet 3   No current facility-administered medications for this visit.     Social History   Social History  . Marital status: Married    Spouse name: N/A  . Number of children: N/A  . Years of education: N/A   Occupational History  . Not on file.   Social History Main Topics  . Smoking status: Never Smoker  . Smokeless tobacco: Never Used  . Alcohol use Yes  . Drug use: Yes  . Sexual activity: Not on file   Other Topics Concern  . Not on file   Social History Narrative  . No narrative on file    Family History  Problem Relation Age of Onset  . Heart disease Father   . Diabetes Father   . Hyperlipidemia Sister    Socially he is married. His former wife  was Dory Larsen, an iinsurance agent. He has been married for several years with his present wife and has 2 stepchildren with her. He does exercise least 4-5 days per  week. He has now retired. Previously he was an Optometrist for [employer].   ROS General: Negative; No fevers, chills, or night sweats;  HEENT: Negative; No changes in vision or hearing, sinus congestion, difficulty swallowing Pulmonary: Negative; No cough, wheezing, shortness of breath, hemoptysis Cardiovascular: Negative; No chest pain, presyncope, syncope, palpitations GI: Negative; No nausea, vomiting, diarrhea, or abdominal pain GU: Negative; No dysuria, hematuria, or difficulty voiding Musculoskeletal: Negative; no myalgias, joint pain, or weakness Hematologic/Oncology: Negative; no easy bruising, bleeding Endocrine: Positive for diabetes mellitus, type II; no heat/cold intolerance;  Neuro: Positive for peripheral neuropathy; no changes in balance, headaches Skin: Negative; No rashes or skin lesions Psychiatric: Negative; No behavioral problems, depression Sleep: Negative; No snoring, daytime sleepiness, hypersomnolence, bruxism, restless legs, hypnogognic hallucinations, no cataplexy Other comprehensive 14 point system review is negative.  PE BP 115/64   Pulse (!) 52   Ht 5' 10"  (1.778 m)   Wt 206 lb 6.4 oz (93.6 kg)   BMI 29.62 kg/m  Repeat blood pressure by me 130/64  Wt Readings from Last 3 Encounters:  06/18/17 206 lb 6.4 oz (93.6 kg)  03/07/17 203 lb (92.1 kg)  02/06/17 207 lb 8 oz (94.1 kg)      Physical Exam BP 115/64   Pulse (!) 52   Ht 5' 10"  (1.778 m)   Wt 206 lb 6.4 oz (93.6 kg)   BMI 29.62 kg/m  General: Alert, oriented, no distress.  Skin: normal turgor, no rashes, warm and dry HEENT: Normocephalic, atraumatic. Pupils equal round and reactive to light; sclera anicteric; extraocular muscles intact;  Nose without nasal septal hypertrophy Mouth/Parynx benign; Mallinpatti scale 2 Neck: No JVD, no carotid bruits; normal carotid upstroke Lungs: clear to ausculatation and percussion; no wheezing or rales Chest wall: without tenderness to  palpitation Heart: PMI not displaced, RRR, s1 s2 normal, 1/6 systolic murmur, no diastolic murmur, no rubs, gallops, thrills, or heaves Abdomen: soft, nontender; no hepatosplenomehaly, BS+; abdominal aorta nontender and not dilated by palpation. Back: no CVA tenderness Pulses 2+ Musculoskeletal: full range of motion, normal strength, no joint deformities Extremities: no clubbing cyanosis or edema, Homan's sign negative  Neurologic: grossly nonfocal; Cranial nerves grossly wnl Psychologic:  Normal mood and affect  ECG (independently read by me): Sinus bradycardia at 55 bpm with first-degree AV block.  PR interval 264 ms.  Nonspecific T changes inferiorly.  ECG (independently read by me): Sinus bradycardia at 59 bpm.  First degree AV block with a PR interval at 23 ms.  Incomplete right bundle branch block.  No significant ST segment changes.  September 2015 ECG (independently read by me): Sinus bradycardia 55 beats per minute.  First degree AV block with PR interval 258 ms.  Mild RV conduction delay.  Nonspecific T changes.  Prior 07/13/2013 ECG: Sinus bradycardia 54 beats per minute with first-degree AV block. Nonspecific T changes.  LABS: BMP Latest Ref Rng & Units 02/03/2017 02/02/2017 11/09/2015  Glucose 65 - 99 mg/dL 121(H) 116(H) 123(H)  BUN 6 - 20 mg/dL 8 11 14   Creatinine 0.61 - 1.24 mg/dL 0.83 0.84 0.93  Sodium 135 - 145 mmol/L 138 133(L) 139  Potassium 3.5 - 5.1 mmol/L 3.9 4.1 4.2  Chloride 101 - 111 mmol/L 106 102 105  CO2 22 - 32 mmol/L 25 24 27   Calcium 8.9 - 10.3 mg/dL 8.5(L) 8.6(L) 9.2   Hepatic Function Latest Ref Rng & Units 11/09/2015 09/29/2014 02/12/2014  Total Protein 6.1 - 8.1 g/dL 6.3 6.4 6.1  Albumin 3.6 - 5.1 g/dL 4.1 4.3 4.1  AST 10 - 35 U/L 36(H) 22 29  ALT 9 - 46 U/L 26 18 20   Alk Phosphatase 40 - 115 U/L 81 89 88  Total Bilirubin 0.2 - 1.2 mg/dL 1.0 1.0 1.0   CBC Latest Ref Rng & Units 02/03/2017 02/02/2017 11/09/2015  WBC 4.0 - 10.5 K/uL 6.4 8.2 3.1(L)    Hemoglobin 13.0 - 17.0 g/dL 13.0 13.6 13.4  Hematocrit 39.0 - 52.0 % 38.8(L) 39.6 40.8  Platelets 150 - 400 K/uL 131(L) 146(L) 179   Lab Results  Component Value Date   MCV 91.7 02/03/2017   MCV 92.1 02/02/2017   MCV 91.7 11/09/2015   Lab Results  Component Value Date   TSH 1.642 02/02/2017   Lab Results  Component Value Date   HGBA1C 5.5 02/03/2017    Lipid Panel     Component Value Date/Time   CHOL 127 11/09/2015 0854   CHOL 107 02/12/2014 0801   TRIG 57 11/09/2015 0854   TRIG 37 02/12/2014 0801   HDL 55 11/09/2015 0854   HDL 57 02/12/2014 0801   CHOLHDL 2.3 11/09/2015 0854   CHOLHDL 2.0 09/29/2014 0853   VLDL 10 09/29/2014 0853   LDLCALC 61 11/09/2015 0854   LDLCALC 43 02/12/2014 0801    RADIOLOGY: No results found.  IMPRESSION:  1. CAD in native artery   2. Atrial flutter, unspecified type ([place]); counterclockwise isthmus dependent right atrial flutter status post radiofrequency ablation with complete bidirectional isthmus block achieved on 02/05/2017   3. Type 2 diabetes mellitus with diabetic neuropathy, without long-term current use of insulin ([place])   4. Mixed hyperlipidemia   5. Essential hypertension   6. Medication management     ASSESSMENT AND PLAN: Ruben [name] is a 71 year old white male who underwent CABG revascularization surgery in 1997 at age 73; and is status post initial stenting of the graft to the circumflex vessel in 2004 which subsequently did close, and in December 2009 stenting of his left main into the superior ramus branch and stenting to the vein graft supplying the RCA.  At that  Catheterization .  The graft to the circumflex ws occluded  At last catheterization  in March 2013 the stent to the left main/ramus vessel was widely patent as was the stent to the vein graft to the right coronary artery. His proximal LAD was being supplied by his diagonal vessel and the mid LAD wassupplied by the LIMA graft.  Since I last saw him, he had  developed atrial flutter which was isthmus dependent right atrial flutter and was successfully ablated.  He was on eloquence anticoagulation for one month.  Subsequent, he has just been on aspirin alone.  Although his stents were many years ago, with his left main stent.  In addition to 20 one-year's following CABG surgery, I have recommended reinstitution of Plavix at all possible.  A 75 g daily.  In the past.  He did have some bruisability and he may try initially taking this every other day to see how he tolerates this.  His blood pressure today is well controlled on carvedilol 6.25 mg twice a day, isosorbide 30 mg, losartan 25 mg.  He is not having angina and also takes Ranexa 1000 g twice a day.  He is on combination therapy for his hyperlipidemia including Crestor 40 mg O [place] 2 capsules daily and he has continued to take niacin.  He has been followed by Dr. Janett Billow timer for his diabetes and has been on Onglyza and Actos.  He is not had recent laboratory.  A complete set of blood work will be obtained.  His peripheral neuropathy is well controlled with Lyrica and he has not had progressive symptoms.  I will contact him regarding his laboratory to see if adjustments to his medical regimen are necessary.  As long as he remains stable I will see him in 6 months for reevaluation.    Time spent: 25 minutes  Troy Sine, MD, [facility]  06/18/2017 5:00 PM

## 2017-06-18 NOTE — Patient Instructions (Signed)
Medication Instructions:  RESTART Plavix 75mg  daily  Labwork: Please return for FASTING labs (CMET, CBC, TSH, Lipid, HmgA1C)  Our in office lab hours are Monday-Friday 8:00-4:30, closed for lunch 1-2 pm.  No appointment needed.  Follow-Up: Your physician wants you to follow-up in: 6 MONTHS with Dr. Claiborne Billings. You will receive a reminder letter in the mail two months in advance. If you don't receive a letter, please call our office to schedule the follow-up appointment.   Any Other Special Instructions Will Be Listed Below (If Applicable).     If you need a refill on your cardiac medications before your next appointment, please call your pharmacy.

## 2017-06-24 ENCOUNTER — Other Ambulatory Visit: Payer: Self-pay | Admitting: Cardiovascular Disease

## 2017-06-24 NOTE — Telephone Encounter (Signed)
REFILL 

## 2017-06-26 ENCOUNTER — Telehealth: Payer: Self-pay | Admitting: Cardiovascular Disease

## 2017-06-26 MED ORDER — NIACIN ER (ANTIHYPERLIPIDEMIC) 1000 MG PO TBCR: 1000.0000 mg | EXTENDED_RELEASE_TABLET | Freq: Every day | ORAL | 3 refills | Status: DC

## 2017-06-26 MED ORDER — ROSUVASTATIN CALCIUM 40 MG PO TABS: 40.0000 mg | ORAL_TABLET | Freq: Every day | ORAL | 3 refills | Status: DC

## 2017-06-26 MED ORDER — CARVEDILOL 6.25 MG PO TABS: 6.2500 mg | ORAL_TABLET | Freq: Two times a day (BID) | ORAL | 3 refills | Status: DC

## 2017-06-26 MED ORDER — RANOLAZINE ER 1000 MG PO TB12: ORAL_TABLET | ORAL | 3 refills | Status: DC

## 2017-06-26 MED ORDER — LOSARTAN POTASSIUM 25 MG PO TABS: 25.0000 mg | ORAL_TABLET | Freq: Every day | ORAL | 3 refills | Status: DC

## 2017-06-26 MED ORDER — CLOPIDOGREL BISULFATE 75 MG PO TABS: 75.0000 mg | ORAL_TABLET | Freq: Every day | ORAL | 3 refills | Status: DC

## 2017-06-26 MED ORDER — ISOSORBIDE MONONITRATE ER 30 MG PO TB24: 30.0000 mg | ORAL_TABLET | Freq: Every day | ORAL | 3 refills | Status: DC

## 2017-06-26 NOTE — Telephone Encounter (Signed)
New Message  Pt states at his appt on 9/25 his prescriptions were sent to the wrong location. He would like them to be sent to [facility] for a 90 day supply. Please call back to discuss

## 2017-06-26 NOTE — Telephone Encounter (Signed)
Spoke to patient confirmed that the 7  rx from 06/18/17 needed to be sent to mail order. Patient stated yes Informed patient all 7 prescription from 06/18/17  E-sent to mail order

## 2017-06-27 DIAGNOSIS — E1140 Type 2 diabetes mellitus with diabetic neuropathy, unspecified: Secondary | ICD-10-CM | POA: Diagnosis not present

## 2017-06-27 DIAGNOSIS — E782 Mixed hyperlipidemia: Secondary | ICD-10-CM | POA: Diagnosis not present

## 2017-06-27 DIAGNOSIS — Z79899 Other long term (current) drug therapy: Secondary | ICD-10-CM | POA: Diagnosis not present

## 2017-06-27 DIAGNOSIS — I4892 Unspecified atrial flutter: Secondary | ICD-10-CM | POA: Diagnosis not present

## 2017-06-27 DIAGNOSIS — I10 Essential (primary) hypertension: Secondary | ICD-10-CM | POA: Diagnosis not present

## 2017-06-27 DIAGNOSIS — I2510 Atherosclerotic heart disease of native coronary artery without angina pectoris: Secondary | ICD-10-CM | POA: Diagnosis not present

## 2017-06-28 DIAGNOSIS — Z23 Encounter for immunization: Secondary | ICD-10-CM | POA: Diagnosis not present

## 2017-06-28 LAB — LIPID PANEL
CHOL/HDL RATIO: 2 ratio (ref 0.0–5.0)
Cholesterol, Total: 114 mg/dL (ref 100–199)
HDL: 57 mg/dL (ref >39)
LDL Calculated: 48 mg/dL (ref 0–99)
Triglycerides: 44 mg/dL (ref 0–149)
VLDL Cholesterol Cal: 9 mg/dL (ref 5–40)

## 2017-06-28 LAB — COMPREHENSIVE METABOLIC PANEL
ALBUMIN: 4.5 g/dL (ref 3.5–4.8)
ALK PHOS: 100 IU/L (ref 39–117)
ALT: 24 IU/L (ref 0–44)
AST: 29 IU/L (ref 0–40)
Albumin/Globulin Ratio: 2.3 — ABNORMAL HIGH (ref 1.2–2.2)
BILIRUBIN TOTAL: 1.2 mg/dL (ref 0.0–1.2)
BUN / CREAT RATIO: 18 (ref 10–24)
BUN: 15 mg/dL (ref 8–27)
CHLORIDE: 102 mmol/L (ref 96–106)
CO2: 22 mmol/L (ref 20–29)
Calcium: 8.8 mg/dL (ref 8.6–10.2)
Creatinine, Ser: 0.85 mg/dL (ref 0.76–1.27)
GFR calc Af Amer: 102 mL/min/{1.73_m2} (ref >59)
GFR calc non Af Amer: 88 mL/min/{1.73_m2} (ref >59)
GLUCOSE: 110 mg/dL — AB (ref 65–99)
Globulin, Total: 2 g/dL (ref 1.5–4.5)
POTASSIUM: 4.3 mmol/L (ref 3.5–5.2)
Sodium: 140 mmol/L (ref 134–144)
Total Protein: 6.5 g/dL (ref 6.0–8.5)

## 2017-06-28 LAB — CBC
HEMATOCRIT: 43 % (ref 37.5–51.0)
Hemoglobin: 14.8 g/dL (ref 13.0–17.7)
MCH: 31.3 pg (ref 26.6–33.0)
MCHC: 34.4 g/dL (ref 31.5–35.7)
MCV: 91 fL (ref 79–97)
PLATELETS: 146 10*3/uL — AB (ref 150–379)
RBC: 4.73 x10E6/uL (ref 4.14–5.80)
RDW: 15.6 % — AB (ref 12.3–15.4)
WBC: 3.8 10*3/uL (ref 3.4–10.8)

## 2017-06-28 LAB — HEMOGLOBIN A1C
ESTIMATED AVERAGE GLUCOSE: 111 mg/dL
HEMOGLOBIN A1C: 5.5 % (ref 4.8–5.6)

## 2017-06-28 LAB — TSH: TSH: 1.77 u[IU]/mL (ref 0.450–4.500)

## 2017-07-03 DIAGNOSIS — M5136 Other intervertebral disc degeneration, lumbar region: Secondary | ICD-10-CM | POA: Diagnosis not present

## 2017-07-03 DIAGNOSIS — M9903 Segmental and somatic dysfunction of lumbar region: Secondary | ICD-10-CM | POA: Diagnosis not present

## 2017-07-03 DIAGNOSIS — M9901 Segmental and somatic dysfunction of cervical region: Secondary | ICD-10-CM | POA: Diagnosis not present

## 2017-07-03 DIAGNOSIS — M9902 Segmental and somatic dysfunction of thoracic region: Secondary | ICD-10-CM | POA: Diagnosis not present

## 2017-07-03 DIAGNOSIS — M9904 Segmental and somatic dysfunction of sacral region: Secondary | ICD-10-CM | POA: Diagnosis not present

## 2017-07-18 DIAGNOSIS — Z79891 Long term (current) use of opiate analgesic: Secondary | ICD-10-CM | POA: Diagnosis not present

## 2017-07-18 DIAGNOSIS — G894 Chronic pain syndrome: Secondary | ICD-10-CM | POA: Diagnosis not present

## 2017-07-18 DIAGNOSIS — G609 Hereditary and idiopathic neuropathy, unspecified: Secondary | ICD-10-CM | POA: Diagnosis not present

## 2017-07-23 ENCOUNTER — Telehealth: Payer: Self-pay | Admitting: Cardiovascular Disease

## 2017-07-23 NOTE — Telephone Encounter (Signed)
New message     *STAT* If patient is at the pharmacy, call can be transferred to refill team.   1. Which medications need to be refilled? (please list name of each medication and dose if known) Niacin 1000 mg, omega 3   2. Which pharmacy/location (including street and city if local pharmacy) is medication to be sent to? [facility]  3. Do they need a 30 day or 90 day supply? 90 day

## 2017-07-24 MED ORDER — NIACIN ER (ANTIHYPERLIPIDEMIC) 1000 MG PO TBCR
1000.0000 mg | EXTENDED_RELEASE_TABLET | Freq: Every day | ORAL | 1 refills | Status: DC
Start: 2017-07-24 — End: 2017-12-19

## 2017-07-24 MED ORDER — OMEGA-3-ACID ETHYL ESTERS 1 G PO CAPS: 2.0000 | ORAL_CAPSULE | Freq: Every day | ORAL | 1 refills | Status: DC

## 2017-07-29 DIAGNOSIS — M5136 Other intervertebral disc degeneration, lumbar region: Secondary | ICD-10-CM | POA: Diagnosis not present

## 2017-07-29 DIAGNOSIS — M9904 Segmental and somatic dysfunction of sacral region: Secondary | ICD-10-CM | POA: Diagnosis not present

## 2017-07-29 DIAGNOSIS — M9902 Segmental and somatic dysfunction of thoracic region: Secondary | ICD-10-CM | POA: Diagnosis not present

## 2017-07-29 DIAGNOSIS — M9903 Segmental and somatic dysfunction of lumbar region: Secondary | ICD-10-CM | POA: Diagnosis not present

## 2017-07-29 DIAGNOSIS — M9901 Segmental and somatic dysfunction of cervical region: Secondary | ICD-10-CM | POA: Diagnosis not present

## 2017-07-31 DIAGNOSIS — M9902 Segmental and somatic dysfunction of thoracic region: Secondary | ICD-10-CM | POA: Diagnosis not present

## 2017-07-31 DIAGNOSIS — M9903 Segmental and somatic dysfunction of lumbar region: Secondary | ICD-10-CM | POA: Diagnosis not present

## 2017-07-31 DIAGNOSIS — M5136 Other intervertebral disc degeneration, lumbar region: Secondary | ICD-10-CM | POA: Diagnosis not present

## 2017-07-31 DIAGNOSIS — M9901 Segmental and somatic dysfunction of cervical region: Secondary | ICD-10-CM | POA: Diagnosis not present

## 2017-07-31 DIAGNOSIS — M9904 Segmental and somatic dysfunction of sacral region: Secondary | ICD-10-CM | POA: Diagnosis not present

## 2017-08-02 ENCOUNTER — Other Ambulatory Visit: Payer: Self-pay | Admitting: Cardiovascular Disease

## 2017-08-05 NOTE — Telephone Encounter (Signed)
REFILL 

## 2017-08-06 DIAGNOSIS — M9904 Segmental and somatic dysfunction of sacral region: Secondary | ICD-10-CM | POA: Diagnosis not present

## 2017-08-06 DIAGNOSIS — M9903 Segmental and somatic dysfunction of lumbar region: Secondary | ICD-10-CM | POA: Diagnosis not present

## 2017-08-06 DIAGNOSIS — M9901 Segmental and somatic dysfunction of cervical region: Secondary | ICD-10-CM | POA: Diagnosis not present

## 2017-08-06 DIAGNOSIS — M5136 Other intervertebral disc degeneration, lumbar region: Secondary | ICD-10-CM | POA: Diagnosis not present

## 2017-08-06 DIAGNOSIS — M9902 Segmental and somatic dysfunction of thoracic region: Secondary | ICD-10-CM | POA: Diagnosis not present

## 2017-08-07 DIAGNOSIS — M9901 Segmental and somatic dysfunction of cervical region: Secondary | ICD-10-CM | POA: Diagnosis not present

## 2017-08-07 DIAGNOSIS — M9904 Segmental and somatic dysfunction of sacral region: Secondary | ICD-10-CM | POA: Diagnosis not present

## 2017-08-07 DIAGNOSIS — M9903 Segmental and somatic dysfunction of lumbar region: Secondary | ICD-10-CM | POA: Diagnosis not present

## 2017-08-07 DIAGNOSIS — M5136 Other intervertebral disc degeneration, lumbar region: Secondary | ICD-10-CM | POA: Diagnosis not present

## 2017-08-07 DIAGNOSIS — M9902 Segmental and somatic dysfunction of thoracic region: Secondary | ICD-10-CM | POA: Diagnosis not present

## 2017-08-12 DIAGNOSIS — M5136 Other intervertebral disc degeneration, lumbar region: Secondary | ICD-10-CM | POA: Diagnosis not present

## 2017-08-12 DIAGNOSIS — M9903 Segmental and somatic dysfunction of lumbar region: Secondary | ICD-10-CM | POA: Diagnosis not present

## 2017-08-12 DIAGNOSIS — M9902 Segmental and somatic dysfunction of thoracic region: Secondary | ICD-10-CM | POA: Diagnosis not present

## 2017-08-12 DIAGNOSIS — M9901 Segmental and somatic dysfunction of cervical region: Secondary | ICD-10-CM | POA: Diagnosis not present

## 2017-08-12 DIAGNOSIS — M9904 Segmental and somatic dysfunction of sacral region: Secondary | ICD-10-CM | POA: Diagnosis not present

## 2017-08-27 DIAGNOSIS — M9904 Segmental and somatic dysfunction of sacral region: Secondary | ICD-10-CM | POA: Diagnosis not present

## 2017-08-27 DIAGNOSIS — M9903 Segmental and somatic dysfunction of lumbar region: Secondary | ICD-10-CM | POA: Diagnosis not present

## 2017-08-27 DIAGNOSIS — M5136 Other intervertebral disc degeneration, lumbar region: Secondary | ICD-10-CM | POA: Diagnosis not present

## 2017-08-27 DIAGNOSIS — M9901 Segmental and somatic dysfunction of cervical region: Secondary | ICD-10-CM | POA: Diagnosis not present

## 2017-08-27 DIAGNOSIS — M9902 Segmental and somatic dysfunction of thoracic region: Secondary | ICD-10-CM | POA: Diagnosis not present

## 2017-09-12 ENCOUNTER — Other Ambulatory Visit: Payer: Self-pay | Admitting: Cardiovascular Disease

## 2017-10-03 DIAGNOSIS — M216X2 Other acquired deformities of left foot: Secondary | ICD-10-CM | POA: Diagnosis not present

## 2017-10-03 DIAGNOSIS — M216X1 Other acquired deformities of right foot: Secondary | ICD-10-CM | POA: Diagnosis not present

## 2017-10-03 DIAGNOSIS — M79672 Pain in left foot: Secondary | ICD-10-CM | POA: Diagnosis not present

## 2017-10-03 DIAGNOSIS — M79671 Pain in right foot: Secondary | ICD-10-CM | POA: Diagnosis not present

## 2017-10-10 DIAGNOSIS — Z79891 Long term (current) use of opiate analgesic: Secondary | ICD-10-CM | POA: Diagnosis not present

## 2017-10-10 DIAGNOSIS — G609 Hereditary and idiopathic neuropathy, unspecified: Secondary | ICD-10-CM | POA: Diagnosis not present

## 2017-10-10 DIAGNOSIS — G894 Chronic pain syndrome: Secondary | ICD-10-CM | POA: Diagnosis not present

## 2017-11-28 DIAGNOSIS — E1159 Type 2 diabetes mellitus with other circulatory complications: Secondary | ICD-10-CM | POA: Diagnosis not present

## 2017-11-28 DIAGNOSIS — G609 Hereditary and idiopathic neuropathy, unspecified: Secondary | ICD-10-CM | POA: Diagnosis not present

## 2017-11-28 DIAGNOSIS — E782 Mixed hyperlipidemia: Secondary | ICD-10-CM | POA: Diagnosis not present

## 2017-11-28 DIAGNOSIS — E559 Vitamin D deficiency, unspecified: Secondary | ICD-10-CM | POA: Diagnosis not present

## 2017-12-05 DIAGNOSIS — I2510 Atherosclerotic heart disease of native coronary artery without angina pectoris: Secondary | ICD-10-CM | POA: Diagnosis not present

## 2017-12-05 DIAGNOSIS — E559 Vitamin D deficiency, unspecified: Secondary | ICD-10-CM | POA: Diagnosis not present

## 2017-12-05 DIAGNOSIS — I10 Essential (primary) hypertension: Secondary | ICD-10-CM | POA: Diagnosis not present

## 2017-12-05 DIAGNOSIS — Z955 Presence of coronary angioplasty implant and graft: Secondary | ICD-10-CM | POA: Diagnosis not present

## 2017-12-05 DIAGNOSIS — Z8679 Personal history of other diseases of the circulatory system: Secondary | ICD-10-CM | POA: Diagnosis not present

## 2017-12-05 DIAGNOSIS — Z7982 Long term (current) use of aspirin: Secondary | ICD-10-CM | POA: Diagnosis not present

## 2017-12-05 DIAGNOSIS — G609 Hereditary and idiopathic neuropathy, unspecified: Secondary | ICD-10-CM | POA: Diagnosis not present

## 2017-12-05 DIAGNOSIS — Z7984 Long term (current) use of oral hypoglycemic drugs: Secondary | ICD-10-CM | POA: Diagnosis not present

## 2017-12-05 DIAGNOSIS — Z7902 Long term (current) use of antithrombotics/antiplatelets: Secondary | ICD-10-CM | POA: Diagnosis not present

## 2017-12-05 DIAGNOSIS — E119 Type 2 diabetes mellitus without complications: Secondary | ICD-10-CM | POA: Diagnosis not present

## 2017-12-05 DIAGNOSIS — E782 Mixed hyperlipidemia: Secondary | ICD-10-CM | POA: Diagnosis not present

## 2017-12-06 ENCOUNTER — Other Ambulatory Visit: Payer: Self-pay | Admitting: Cardiovascular Disease

## 2017-12-06 NOTE — Telephone Encounter (Signed)
REFILL 

## 2017-12-19 ENCOUNTER — Ambulatory Visit (INDEPENDENT_AMBULATORY_CARE_PROVIDER_SITE_OTHER): Payer: Medicare Other | Admitting: Cardiovascular Disease

## 2017-12-19 VITALS — BP 110/60 | HR 54 | Ht 70.0 in | Wt 211.0 lb

## 2017-12-19 DIAGNOSIS — I2510 Atherosclerotic heart disease of native coronary artery without angina pectoris: Secondary | ICD-10-CM

## 2017-12-19 DIAGNOSIS — I10 Essential (primary) hypertension: Secondary | ICD-10-CM

## 2017-12-19 DIAGNOSIS — I4892 Unspecified atrial flutter: Secondary | ICD-10-CM | POA: Diagnosis not present

## 2017-12-19 DIAGNOSIS — Z951 Presence of aortocoronary bypass graft: Secondary | ICD-10-CM

## 2017-12-19 DIAGNOSIS — E782 Mixed hyperlipidemia: Secondary | ICD-10-CM | POA: Diagnosis not present

## 2017-12-19 DIAGNOSIS — G629 Polyneuropathy, unspecified: Secondary | ICD-10-CM | POA: Diagnosis not present

## 2017-12-19 NOTE — Progress Notes (Signed)
Patient ID: Ruben [name], male   DOB: [birth year], 72 y.o.   MRN: [id number]    PCP: Dr. Dagmar Hait  HPI: Ruben [name] is a 72 y.o. male who presents for a 6 month cardiology followup evaluation.   Ruben [name] has established CAD and in 1997 underwent CABG surgery with a LIMA to the LAD, vein to the diagonal, vein to the obtuse marginal, and vein to the right coronary artery. In November 2004 he underwent stenting to the vein graft to the circumflex vessel. In December 2009 the graft to the circumflex was occluded and he underwent stenting of the left main into the superior ramus intermediate vessel. At that time he also underwent stenting of the vein graft to the right coronary artery. His last catheterization which was done for repeat chest pain in March 2013 showed patent stents. He has known occlusion of the graft that supplied the circumflex marginal vessel. The graft to diagonal vessel was patent and filled the proximal LAD system and the LIMA graft which filled the mid LAD system was widely patent.  He has ahistory of mixed hyperlipidemia and has been on aggressive lipid-lowering therapy over the past 18 years. He also has developed a peripheral neuropathy. He has mild diabetes mellitus and is now followed by [name]. In April 2014 which labs showed a total cholesterol was 125, triglycerides 52, HDL 55, LDL 52 on aggressive combination therapy consisting of Niaspan 2 g daily, Crestor 40 mg daily, Lovaza 2 g daily.  Initially, we had followed him with [facility] heart lab assessments.  On 12/08/2013 a followup nuclear perfusion study continued to show normal perfusion without scar or ischemia on his current medical therapy.  Ejection fraction was 64%.  He is retired for several years and with more time he exercises regularly and walks up to 3 -5 miles per day approximately 4-5 days per week and does yoga 3 days per week.  He denies any recurrent anginal symptoms.  He denies any shortness of breath.   He denies PND, orthopnea.  He denies palpitations.   Laboratory in May 2015 showed significant aggressive lipid management with an LDL particle number now at 371 and calculated LDL 43, HDL 57, triglycerides 37, total cholesterol 107.  Small LDL particles of 156.  He still had an increased VLDL size.  Insulin resistance score was less than 25.  At that time, I recommended he reduce his Niaspan from 2000 mg to 1000 mg a discussed the possibility of discontinuing this altogether in the future depending upon subsequent levels.  In March 2017 he underwent a repeat echo Doppler study which showed mild LVH with normal systolic function without wall motion abnormalities.  There was very mild aortic regurgitation.  His aortic root dimension measured 39 mm.  His left atrium was mildly dilated.  Pulmonary pressures were normal.  He also underwent a repeat lipoprofile which showed an LDL particle #856.  He had 136 small LDL particles was normal.  Total cholesterol was 127, HDL 55, triglycerides 57, and LDL 61.  Apo lipoprotein B was 54 and LPa was 66.  In May 2018  developed a new onset atrial flutter.  He was hospitalized; TEE showed mild dilation of his atrium without thrombus.  He underwent atrial flutter ablation by [name] and was found to have isthmus dependent right atrial flutter.  He was started on eliquis doing his presentation.  He saw [name] in follow-up on 03/07/2017 and eliquis was discontinued at that time  and he was told to resume aspirin.  He was questioning whether or not to take Plavix again.  He also had an ACE-induced cough and lisinopril was discontinued and he was started on low-dose losartan.    Since I last saw in September 2018, he continues to do well is minimal without recurrent anginal symptoms.he is unaware of any recurrent atrial arrhythmias.  He continues have problems with his neuropathy  Because of the neuropathy.  He is walking approximately 2-3 miles per day instead of 3-5 miles  as he had in the past.  He continues to use Lyrica and Nucynta.  He is now back on Plavix and aspirin particular with his left main stent.  He continues to be followed by [name] who checks his laboratory.  Most recent labs from 11/28/17 .  Humulin Restoril 125, triglycerides 40, HDL 60, and LDL 50.  He presents for evaluation.  Past Surgical History:  Procedure Laterality Date  . A-FLUTTER ABLATION N/A 02/05/2017   Procedure: A-Flutter Ablation;  Surgeon: Thompson Grayer, MD;  Location: [place] CV LAB;  Service: Cardiovascular;  Laterality: N/A;  . CARDIAC CATHETERIZATION  12/13/2011   No intervention - recommend medical therapy with increased medical trial  . CARDIOVASCULAR STRESS TEST  10/26/2010   No scintigraphic evidence of inducible myocardial ischemia. No ECG changes. EKG negative for ischemia.  . CORONARY ARTERY BYPASS GRAFT  1997   LIMA-LAD, vein graft to diagonal, vein graft to obtuse marginal, and vein graft to RCA  . LEFT HEART CATHETERIZATION WITH CORONARY/GRAFT ANGIOGRAM N/A 12/13/2011   Procedure: LEFT HEART CATHETERIZATION WITH Beatrix Fetters;  Surgeon: Troy Sine, MD;  Location: [facility] CATH LAB;  Service: Cardiovascular;  Laterality: N/A;  . LOWER EXTREMITY ARTERIAL DOPPLER  01/30/2000   Normal arterial study  . TEE WITHOUT CARDIOVERSION N/A 02/04/2017   Procedure: TRANSESOPHAGEAL ECHOCARDIOGRAM (TEE);  Surgeon: Dorothy Spark, MD;  Location[facility] ENDOSCOPY;  Service: Cardiovascular;  Laterality: N/A;  . TRANSTHORACIC ECHOCARDIOGRAM  12/12/2011   EF >55%, LA severely dilated.    Allergies  Allergen Reactions  . Cymbalta [Duloxetine Hcl] Diarrhea    Current Outpatient Medications  Medication Sig Dispense Refill  . aspirin EC 81 MG tablet Take 1 tablet (81 mg total) by mouth daily. 90 tablet 3  . carvedilol (COREG) 6.25 MG tablet TAKE 1 TABLET(6.25 MG) BY MOUTH TWICE DAILY WITH A MEAL 180 tablet 3  . celecoxib (CELEBREX) 100 MG capsule Take 100 mg by mouth daily.     . clopidogrel (PLAVIX) 75 MG tablet Take 1 tablet (75 mg total) by mouth daily. 90 tablet 3  . isosorbide mononitrate (IMDUR) 30 MG 24 hr tablet Take 1 tablet (30 mg total) by mouth daily. 90 tablet 3  . L-Methylfolate-B6-B12 (METANX PO) Take 1 tablet by mouth 2 (two) times daily.     . niacin (NIASPAN) 1000 MG CR tablet TAKE 1 TABLET BY MOUTH AT BEDTIME. NEED TO SCHEDULE AN APPOINTMENT FOR FUTURE REFILLS. 30 tablet 2  . nitroGLYCERIN (NITROLINGUAL) 0.4 MG/SPRAY spray Place 1 spray under the tongue every 5 (five) minutes x 3 doses as needed for chest pain. 12 g 11  . NUCYNTA ER 200 MG TB12 Take 200 mg by mouth 2 (two) times daily.  0  . omega-3 acid ethyl esters (LOVAZA) 1 g capsule Take 2 capsules (2 g total) by mouth daily. 180 capsule 1  . oxyCODONE-acetaminophen (PERCOCET/ROXICET) 5-325 MG tablet Take 1 tablet by mouth daily as needed for moderate pain.     Marland Kitchen  pioglitazone (ACTOS) 45 MG tablet Take 45 mg by mouth daily.    . pregabalin (LYRICA) 200 MG capsule Take 200 mg by mouth 3 (three) times daily.     . ranolazine (RANEXA) 1000 MG SR tablet Take 1 tablet (1,000 mg total) 2 (two) times daily by mouth. 180 tablet 2  . rosuvastatin (CRESTOR) 40 MG tablet Take 1 tablet (40 mg total) by mouth daily. 90 tablet 3  . saxagliptin HCl (ONGLYZA) 5 MG TABS tablet Take 5 mg by mouth daily.    Marland Kitchen losartan (COZAAR) 25 MG tablet Take 1 tablet (25 mg total) by mouth daily. 90 tablet 3   No current facility-administered medications for this visit.     Social History   Socioeconomic History  . Marital status: Married    Spouse name: Not on file  . Number of children: Not on file  . Years of education: Not on file  . Highest education level: Not on file  Occupational History  . Not on file  Social Needs  . Financial resource strain: Not on file  . Food insecurity:    Worry: Not on file    Inability: Not on file  . Transportation needs:    Medical: Not on file    Non-medical: Not on file    Tobacco Use  . Smoking status: Never Smoker  . Smokeless tobacco: Never Used  Substance and Sexual Activity  . Alcohol use: Yes  . Drug use: Yes  . Sexual activity: Not on file  Lifestyle  . Physical activity:    Days per week: Not on file    Minutes per session: Not on file  . Stress: Not on file  Relationships  . Social connections:    Talks on phone: Not on file    Gets together: Not on file    Attends religious service: Not on file    Active member of club or organization: Not on file    Attends meetings of clubs or organizations: Not on file    Relationship status: Not on file  . Intimate partner violence:    Fear of current or ex partner: Not on file    Emotionally abused: Not on file    Physically abused: Not on file    Forced sexual activity: Not on file  Other Topics Concern  . Not on file  Social History Narrative  . Not on file    Family History  Problem Relation Age of Onset  . Heart disease Father   . Diabetes Father   . Hyperlipidemia Sister    Socially he is married. His former wife  was Dory Larsen, an iins[employer] agent. He has been married for several years with his present wife and has 2 stepchildren with her. He does exercise least 4-5 days per week. He has now retired. Previously he was an Optometrist for [employer].   ROS General: Negative; No fevers, chills, or night sweats;  HEENT: Negative; No changes in vision or hearing, sinus congestion, difficulty swallowing Pulmonary: Negative; No cough, wheezing, shortness of breath, hemoptysis Cardiovascular: Negative; No chest pain, presyncope, syncope, palpitations GI: Negative; No nausea, vomiting, diarrhea, or abdominal pain GU: Negative; No dysuria, hematuria, or difficulty voiding Musculoskeletal: Negative; no myalgias, joint pain, or weakness Hematologic/Oncology: Negative; no easy bruising, bleeding Endocrine: Positive for diabetes mellitus, type II; no heat/cold intolerance;  Neuro:  Positive for peripheral neuropathy; no changes in balance, headaches Skin: Negative; No rashes or skin lesions Psychiatric: Negative; No behavioral problems,  depression Sleep: Negative; No snoring, daytime sleepiness, hypersomnolence, bruxism, restless legs, hypnogognic hallucinations, no cataplexy Other comprehensive 14 point system review is negative.  PE BP 110/60 (BP Location: Left Arm, Patient Position: Sitting, Cuff Size: Normal)   Pulse (!) 54   Ht 5' 10"  (1.778 m)   Wt 211 lb (95.7 kg)   BMI 30.28 kg/m    Repeat blood pressure by me 114/64  Wt Readings from Last 3 Encounters:  12/19/17 211 lb (95.7 kg)  06/18/17 206 lb 6.4 oz (93.6 kg)  03/07/17 203 lb (92.1 kg)     Physical Exam BP 110/60 (BP Location: Left Arm, Patient Position: Sitting, Cuff Size: Normal)   Pulse (!) 54   Ht 5' 10"  (1.778 m)   Wt 211 lb (95.7 kg)   BMI 30.28 kg/m  General: Alert, oriented, no distress.  Skin: normal turgor, no rashes, warm and dry HEENT: Normocephalic, atraumatic. Pupils equal round and reactive to light; sclera anicteric; extraocular muscles intact; Fundi ** Nose without nasal septal hypertrophy Mouth/Parynx benign; Mallinpatti scale Neck: No JVD, no carotid bruits; normal carotid upstroke Lungs: clear to ausculatation and percussion; no wheezing or rales Chest wall: without tenderness to palpitation Heart: PMI not displaced, RRR, s1 s2 normal, 1/6 systolic murmur, no diastolic murmur, no rubs, gallops, thrills, or heaves Abdomen: soft, nontender; no hepatosplenomehaly, BS+; abdominal aorta nontender and not dilated by palpation. Back: no CVA tenderness Pulses 2+ Musculoskeletal: full range of motion, normal strength, no joint deformities Extremities: no clubbing cyanosis or edema, Homan's sign negative  Neurologic: grossly nonfocal; Cranial nerves grossly wnl Psychologic: Normal mood and affect   ECG (independently read by me): sinus bradycardia 54 bpm.  First degree AV  block.  Nonspecific T waves.  PR interval 268 ms  September 2018 ECG (independently read by me): Sinus bradycardia at 55 bpm with first-degree AV block.  PR interval 264 ms.  Nonspecific T changes inferiorly.  November 2017 ECG (independently read by me): Sinus bradycardia at 59 bpm.  First degree AV block with a PR interval at 23 ms.  Incomplete right bundle branch block.  No significant ST segment changes.  September 2015 ECG (independently read by me): Sinus bradycardia 55 beats per minute.  First degree AV block with PR interval 258 ms.  Mild RV conduction delay.  Nonspecific T changes.  Prior 07/13/2013 ECG: Sinus bradycardia 54 beats per minute with first-degree AV block. Nonspecific T changes.  LABS: BMP Latest Ref Rng & Units 06/27/2017 02/03/2017 02/02/2017  Glucose 65 - 99 mg/dL 110(H) 121(H) 116(H)  BUN 8 - 27 mg/dL 15 8 11   Creatinine 0.76 - 1.27 mg/dL 0.85 0.83 0.84  BUN/Creat Ratio 10 - 24 18 - -  Sodium 134 - 144 mmol/L 140 138 133(L)  Potassium 3.5 - 5.2 mmol/L 4.3 3.9 4.1  Chloride 96 - 106 mmol/L 102 106 102  CO2 20 - 29 mmol/L 22 25 24   Calcium 8.6 - 10.2 mg/dL 8.8 8.5(L) 8.6(L)   Hepatic Function Latest Ref Rng & Units 06/27/2017 11/09/2015 09/29/2014  Total Protein 6.0 - 8.5 g/dL 6.5 6.3 6.4  Albumin 3.5 - 4.8 g/dL 4.5 4.1 4.3  AST 0 - 40 IU/L 29 36(H) 22  ALT 0 - 44 IU/L 24 26 18   Alk Phosphatase 39 - 117 IU/L 100 81 89  Total Bilirubin 0.0 - 1.2 mg/dL 1.2 1.0 1.0   CBC Latest Ref Rng & Units 06/27/2017 02/03/2017 02/02/2017  WBC 3.4 - 10.8 x10E3/uL 3.8 6.4 8.2  Hemoglobin  13.0 - 17.7 g/dL 14.8 13.0 13.6  Hematocrit 37.5 - 51.0 % 43.0 38.8(L) 39.6  Platelets 150 - 379 x10E3/uL 146(L) 131(L) 146(L)   Lab Results  Component Value Date   MCV 91 06/27/2017   MCV 91.7 02/03/2017   MCV 92.1 02/02/2017   Lab Results  Component Value Date   TSH 1.770 06/27/2017   Lab Results  Component Value Date   HGBA1C 5.5 06/27/2017    Lipid Panel     Component Value  Date/Time   CHOL 114 06/27/2017 0810   CHOL 127 11/09/2015 0854   CHOL 107 02/12/2014 0801   TRIG 44 06/27/2017 0810   TRIG 57 11/09/2015 0854   TRIG 37 02/12/2014 0801   HDL 57 06/27/2017 0810   HDL 55 11/09/2015 0854   HDL 57 02/12/2014 0801   CHOLHDL 2.0 06/27/2017 0810   CHOLHDL 2.3 11/09/2015 0854   CHOLHDL 2.0 09/29/2014 0853   VLDL 10 09/29/2014 0853   LDLCALC 48 06/27/2017 0810   LDLCALC 61 11/09/2015 0854   LDLCALC 43 02/12/2014 0801    RADIOLOGY: No results found.  IMPRESSION:  1. CAD in native artery   2. Atrial flutter, unspecified type ([place]); counterclockwise isthmus dependent right atrial flutter status post radiofrequency ablation with complete bidirectional isthmus block achieved on 02/05/2017   3. Mixed hyperlipidemia   4. Essential hypertension   5. Hx of CABG   6. Peripheral polyneuropathy     ASSESSMENT AND PLAN: Ruben [name] is a 72 year old white male who underwent CABG revascularization surgery in 1997 at age 3; and is status post initial stenting of the graft to the circumflex vessel in 2004 which subsequently was found to be occluded and in December 2009 underwent stenting of his left main into the superior ramus branch and stenting to the vein graft supplying the RCA.  At that evaluation the graft to the circumflex ws occluded  At last catheterization in March 2013 the stent to the left main/ramus vessel was widely patent as was the stent to the vein graft to the right coronary artery. His proximal LAD was being supplied by his diagonal vessel and the mid LAD wassupplied by the LIMA graft.  He  developed atrial flutter in 2018 which was isthmus dependent right atrial flutter and was successfully ablated.  He was on eliquis anticoagulation for one month. When I last saw him, he had been on aspirin alone and with his left main stent.  I also recommended resumption of Plavix.  He has done well with a long 2 antiplatelet therapy.  He denies bleeding.  His  blood pressure today is controlled on his current medical regimen consisting of carvedilol 6.25 twice a day, isosorbide 30 mg, and losartan 25 mg. He is not having any anginal symptoms on therapy, which also include isosorbide 30 mg and ranolazine 1000 mg twice a day. I reviewed laboratory from Dr. Elyse Hsu.  In the past, he was found to have an LP little a of 66.  In the future.  I will recheck Lpa since risk is increased.  If greater than 50.  He is on multiple therapy for his peripheral neuropathy including Lyrica and Nucynta.  He is tolerating Crestor 40 mg, lovaza and continues to take niacin since remotely he had had significantly low HDL and slightly increased LPa.  His most recent hemoglobin A1c level is excellent and he continues to be on Actos.he continues to exercise regularly.  I will see him in 6 months for reevaluation  Troy Sine, MD, [facility]  12/20/2017 6:41 PM

## 2017-12-19 NOTE — Patient Instructions (Addendum)
Medication Instructions:  Your physician recommends that you continue on your current medications as directed. Please refer to the Current Medication list given to you today.   Labwork: Please return for FASTING labs in 6 months (CMET, CBC, Lipid, TSH, LP(a))-lab slips will be mailed to you. Our in office lab hours are Monday-Friday 8:00-4:00, closed for lunch 12:45-1:45 pm.  No appointment needed.  Follow-Up: Your physician wants you to follow-up in: 6 months with Dr. Claiborne Billings.  You will receive a reminder letter in the mail two months in advance. If you don't receive a letter, please call our office to schedule the follow-up appointment.    Any Other Special Instructions Will Be Listed Below (If Applicable).     If you need a refill on your cardiac medications before your next appointment, please call your pharmacy.

## 2017-12-20 ENCOUNTER — Encounter: Payer: Self-pay | Admitting: Cardiovascular Disease

## 2017-12-26 ENCOUNTER — Encounter: Payer: Self-pay | Admitting: Cardiovascular Disease

## 2018-01-01 ENCOUNTER — Telehealth: Payer: Self-pay | Admitting: Cardiovascular Disease

## 2018-01-01 NOTE — Telephone Encounter (Signed)
Returned call to patient left message on personal voice mail email was sent to [facility].Dr.Kelly and his RN out of office today.I will make them aware.

## 2018-01-01 NOTE — Telephone Encounter (Signed)
New message     Please check my chart message and respond back to patient

## 2018-01-02 NOTE — Telephone Encounter (Signed)
Reviewed with Dr. Claiborne Billings and responded via [facility].

## 2018-01-03 DIAGNOSIS — G609 Hereditary and idiopathic neuropathy, unspecified: Secondary | ICD-10-CM | POA: Diagnosis not present

## 2018-01-03 DIAGNOSIS — Z79891 Long term (current) use of opiate analgesic: Secondary | ICD-10-CM | POA: Diagnosis not present

## 2018-01-03 DIAGNOSIS — G894 Chronic pain syndrome: Secondary | ICD-10-CM | POA: Diagnosis not present

## 2018-02-06 DIAGNOSIS — Z1159 Encounter for screening for other viral diseases: Secondary | ICD-10-CM | POA: Diagnosis not present

## 2018-02-06 DIAGNOSIS — G629 Polyneuropathy, unspecified: Secondary | ICD-10-CM | POA: Diagnosis not present

## 2018-02-06 DIAGNOSIS — E1142 Type 2 diabetes mellitus with diabetic polyneuropathy: Secondary | ICD-10-CM | POA: Diagnosis not present

## 2018-02-06 DIAGNOSIS — I2510 Atherosclerotic heart disease of native coronary artery without angina pectoris: Secondary | ICD-10-CM | POA: Diagnosis not present

## 2018-02-06 DIAGNOSIS — Z79899 Other long term (current) drug therapy: Secondary | ICD-10-CM | POA: Diagnosis not present

## 2018-02-06 DIAGNOSIS — E782 Mixed hyperlipidemia: Secondary | ICD-10-CM | POA: Diagnosis not present

## 2018-02-06 DIAGNOSIS — Z0001 Encounter for general adult medical examination with abnormal findings: Secondary | ICD-10-CM | POA: Diagnosis not present

## 2018-02-06 DIAGNOSIS — Z125 Encounter for screening for malignant neoplasm of prostate: Secondary | ICD-10-CM | POA: Diagnosis not present

## 2018-02-06 DIAGNOSIS — D649 Anemia, unspecified: Secondary | ICD-10-CM | POA: Diagnosis not present

## 2018-02-06 DIAGNOSIS — I10 Essential (primary) hypertension: Secondary | ICD-10-CM | POA: Diagnosis not present

## 2018-02-14 DIAGNOSIS — M9903 Segmental and somatic dysfunction of lumbar region: Secondary | ICD-10-CM | POA: Diagnosis not present

## 2018-02-14 DIAGNOSIS — M5136 Other intervertebral disc degeneration, lumbar region: Secondary | ICD-10-CM | POA: Diagnosis not present

## 2018-02-14 DIAGNOSIS — M5030 Other cervical disc degeneration, unspecified cervical region: Secondary | ICD-10-CM | POA: Diagnosis not present

## 2018-02-14 DIAGNOSIS — M9902 Segmental and somatic dysfunction of thoracic region: Secondary | ICD-10-CM | POA: Diagnosis not present

## 2018-02-14 DIAGNOSIS — M9905 Segmental and somatic dysfunction of pelvic region: Secondary | ICD-10-CM | POA: Diagnosis not present

## 2018-02-14 DIAGNOSIS — M9901 Segmental and somatic dysfunction of cervical region: Secondary | ICD-10-CM | POA: Diagnosis not present

## 2018-03-04 ENCOUNTER — Encounter (HOSPITAL_COMMUNITY): Payer: Self-pay

## 2018-03-04 ENCOUNTER — Emergency Department (HOSPITAL_COMMUNITY): Payer: Medicare Other

## 2018-03-04 ENCOUNTER — Other Ambulatory Visit: Payer: Self-pay

## 2018-03-04 ENCOUNTER — Emergency Department (HOSPITAL_COMMUNITY)
Admission: EM | Admit: 2018-03-04 | Discharge: 2018-03-05 | Disposition: A | Discharge status: Home / Self Care | Payer: Medicare Other | Attending: Emergency Medicine | Admitting: Emergency Medicine

## 2018-03-04 ENCOUNTER — Ambulatory Visit (HOSPITAL_COMMUNITY)
Admission: EM | Admit: 2018-03-04 | Discharge: 2018-03-04 | Disposition: A | Discharge status: Home / Self Care | Payer: Medicare Other | Source: Home / Self Care

## 2018-03-04 ENCOUNTER — Encounter (HOSPITAL_COMMUNITY): Payer: Self-pay | Admitting: Family Medicine

## 2018-03-04 DIAGNOSIS — R42 Dizziness and giddiness: Secondary | ICD-10-CM | POA: Diagnosis not present

## 2018-03-04 DIAGNOSIS — M79602 Pain in left arm: Secondary | ICD-10-CM | POA: Diagnosis not present

## 2018-03-04 LAB — CBC WITH DIFFERENTIAL/PLATELET
ABS IMMATURE GRANULOCYTES: 0 10*3/uL (ref 0.0–0.1)
BASOS ABS: 0 10*3/uL (ref 0.0–0.1)
Basophils Relative: 1 %
Eosinophils Absolute: 0.1 10*3/uL (ref 0.0–0.7)
Eosinophils Relative: 2 %
HCT: 42.5 % (ref 39.0–52.0)
HEMOGLOBIN: 13.9 g/dL (ref 13.0–17.0)
IMMATURE GRANULOCYTES: 0 %
LYMPHS PCT: 18 %
Lymphs Abs: 1 10*3/uL (ref 0.7–4.0)
MCH: 31 pg (ref 26.0–34.0)
MCHC: 32.7 g/dL (ref 30.0–36.0)
MCV: 94.9 fL (ref 78.0–100.0)
MONO ABS: 0.7 10*3/uL (ref 0.1–1.0)
Monocytes Relative: 11 %
NEUTROS PCT: 68 %
Neutro Abs: 4 10*3/uL (ref 1.7–7.7)
PLATELETS: 153 10*3/uL (ref 150–400)
RBC: 4.48 MIL/uL (ref 4.22–5.81)
RDW: 15.4 % (ref 11.5–15.5)
WBC: 5.8 10*3/uL (ref 4.0–10.5)

## 2018-03-04 LAB — COMPREHENSIVE METABOLIC PANEL
ALBUMIN: 4.3 g/dL (ref 3.5–5.0)
ALT: 25 U/L (ref 17–63)
AST: 40 U/L (ref 15–41)
Alkaline Phosphatase: 93 U/L (ref 38–126)
Anion gap: 8 (ref 5–15)
BILIRUBIN TOTAL: 1.3 mg/dL — AB (ref 0.3–1.2)
BUN: 13 mg/dL (ref 6–20)
CO2: 27 mmol/L (ref 22–32)
Calcium: 9.5 mg/dL (ref 8.9–10.3)
Chloride: 104 mmol/L (ref 101–111)
Creatinine, Ser: 0.9 mg/dL (ref 0.61–1.24)
GFR calc non Af Amer: >60 mL/min (ref >60)
GLUCOSE: 106 mg/dL — AB (ref 65–99)
POTASSIUM: 4.4 mmol/L (ref 3.5–5.1)
SODIUM: 139 mmol/L (ref 135–145)
TOTAL PROTEIN: 6.7 g/dL (ref 6.5–8.1)

## 2018-03-04 LAB — I-STAT TROPONIN, ED
TROPONIN I, POC: 0 ng/mL (ref 0.00–0.08)
TROPONIN I, POC: 0 ng/mL (ref 0.00–0.08)

## 2018-03-04 NOTE — ED Triage Notes (Signed)
Pt endorses left arm pain and lightheadedness that began around 1100 this morning, worse with exertion, better with rest. Hx of bypass x 5 and 3 stents. VSS.

## 2018-03-04 NOTE — ED Triage Notes (Signed)
Based on pt symptoms pt being sent to the ER per Dr. Meda Coffee.

## 2018-03-04 NOTE — ED Provider Notes (Signed)
Patient placed in Quick Look pathway, seen and evaluated   Chief Complaint: Sharp pain in left arm  HPI:   Patient presents today for intermittent left arm pain with light headedness.  Currently has 3/10 left arm pain.  He has a history of CABGx5, with three stents.  Originally went to urgent care.  Pain originally started when washing a car.  Pain goes away when sitting down, returns with exertion.  No N/V/D, no diaphoresis    ROS: No CP Physical Exam:   Gen: No distress  Neuro: Awake and Alert  Skin: Warm    Focused Exam: No pallor, left arm is warm, normal color.   Initiation of care has begun. The patient has been counseled on the process, plan, and necessity for staying for the completion/evaluation, and the remainder of the medical screening examination    Ollen Gross 03/04/18 1625    Malvin Johns, MD 03/04/18 2032

## 2018-03-04 NOTE — ED Triage Notes (Addendum)
Pt with hx or cardiac bypass and a flutter reports that this am he was washing the car he felt some soreness in the left arm. The pain decreased and then returned with light headedness. sts that pain is sharp. Denies chest pain, SOB

## 2018-03-05 ENCOUNTER — Telehealth: Payer: Self-pay | Admitting: Cardiovascular Disease

## 2018-03-05 NOTE — Telephone Encounter (Signed)
New Message    Patient is calling to advise that he went to the hospital yesterday at around 4pm and left this morning at 1am after waiting several hours. He went because he was having some tightness and pressure in his left arm. He wanting to get feedback on the test that he had done since he decide to leave.

## 2018-03-05 NOTE — Telephone Encounter (Signed)
Spoke to patient . Patient states he had 3 episode yesterday  -  Left arm pain ( area were you would get a shot - tight and painful. )  1 -washing car ,but not using that arm. 2- walking outside. 3 walking up stairs. Patient states he went [facility] - EKG -done , then went to ER. Patient states he was there 9 hours,did not received any information so he left . 2 troponin obtained. Both were negative.   Patient will continue to monitor , and appointment schedule for 03/07/18 ,if sx free patient call an cancel.

## 2018-03-05 NOTE — ED Notes (Signed)
Patient left.

## 2018-03-07 ENCOUNTER — Ambulatory Visit (INDEPENDENT_AMBULATORY_CARE_PROVIDER_SITE_OTHER): Payer: Medicare Other | Admitting: Cardiovascular Disease

## 2018-03-07 ENCOUNTER — Encounter: Payer: Self-pay | Admitting: Cardiovascular Disease

## 2018-03-07 VITALS — BP 139/73 | HR 57 | Ht 70.0 in | Wt 213.0 lb

## 2018-03-07 DIAGNOSIS — R0789 Other chest pain: Secondary | ICD-10-CM | POA: Diagnosis not present

## 2018-03-07 DIAGNOSIS — I2510 Atherosclerotic heart disease of native coronary artery without angina pectoris: Secondary | ICD-10-CM | POA: Diagnosis not present

## 2018-03-07 DIAGNOSIS — Z951 Presence of aortocoronary bypass graft: Secondary | ICD-10-CM | POA: Diagnosis not present

## 2018-03-07 DIAGNOSIS — E782 Mixed hyperlipidemia: Secondary | ICD-10-CM | POA: Diagnosis not present

## 2018-03-07 DIAGNOSIS — G629 Polyneuropathy, unspecified: Secondary | ICD-10-CM | POA: Diagnosis not present

## 2018-03-07 DIAGNOSIS — I10 Essential (primary) hypertension: Secondary | ICD-10-CM

## 2018-03-07 MED ORDER — NIACIN ER (ANTIHYPERLIPIDEMIC) 1000 MG PO TBCR: 1000.0000 mg | EXTENDED_RELEASE_TABLET | Freq: Every day | ORAL | 3 refills | Status: DC

## 2018-03-07 MED ORDER — OMEGA-3-ACID ETHYL ESTERS 1 G PO CAPS: 2.0000 | ORAL_CAPSULE | Freq: Every day | ORAL | 3 refills | Status: DC

## 2018-03-07 MED ORDER — NITROGLYCERIN 0.4 MG/SPRAY TL SOLN: 1.0000 | 11 refills | Status: DC | PRN

## 2018-03-07 NOTE — Patient Instructions (Signed)
Medication Instructions:  Your physician recommends that you continue on your current medications as directed. Please refer to the Current Medication list given to you today.  Testing/Procedures: Your physician has requested that you have an exercise stress myoview. For further information please visit [email or web address]. Please follow instruction sheet, as given.  Follow-Up: After testing with Dr. Claiborne Billings  Any Other Special Instructions Will Be Listed Below (If Applicable).     If you need a refill on your cardiac medications before your next appointment, please call your pharmacy.

## 2018-03-07 NOTE — Progress Notes (Signed)
Patient ID: [facility] LIPTON, male   DOB: August 08, 1946, 72 y.o.   MRN: [id number]    PCP: Dr. Dagmar Hait  HPI: YOHANNES WAIBEL is a 72 y.o. male who presents for a  cardiology followup evaluation after recently presenting to the emergency room.   Mr. Duhe has established CAD and in 1997 underwent CABG surgery with a LIMA to the LAD, vein to the diagonal, vein to the obtuse marginal, and vein to the right coronary artery. In November 2004 he underwent stenting to the vein graft to the circumflex vessel. In December 2009 the graft to the circumflex was occluded and he underwent stenting of the left main into the superior ramus intermediate vessel. At that time he also underwent stenting of the vein graft to the right coronary artery. His last catheterization which was done for repeat chest pain in March 2013 showed patent stents. He has known occlusion of the graft that supplied the circumflex marginal vessel. The graft to diagonal vessel was patent and filled the proximal LAD system and the LIMA graft which filled the mid LAD system was widely patent.  He has ahistory of mixed hyperlipidemia and has been on aggressive lipid-lowering therapy over the past 18 years. He also has developed a peripheral neuropathy. He has mild diabetes mellitus and is now followed by [name]. In April 2014 which labs showed a total cholesterol was 125, triglycerides 52, HDL 55, LDL 52 on aggressive combination therapy consisting of Niaspan 2 g daily, Crestor 40 mg daily, Lovaza 2 g daily.  Initially, we had followed him with [facility] heart lab assessments.  On 12/08/2013 a followup nuclear perfusion study continued to show normal perfusion without scar or ischemia on his current medical therapy.  Ejection fraction was 64%.  He is retired for several years and with more time he exercises regularly and walks up to 3 -5 miles per day approximately 4-5 days per week and does yoga 3 days per week.  He denies any recurrent anginal  symptoms.  He denies any shortness of breath.  He denies PND, orthopnea.  He denies palpitations.   Laboratory in May 2015 showed significant aggressive lipid management with an LDL particle number now at 371 and calculated LDL 43, HDL 57, triglycerides 37, total cholesterol 107.  Small LDL particles of 156.  He still had an increased VLDL size.  Insulin resistance score was less than 25.  At that time, I recommended he reduce his Niaspan from 2000 mg to 1000 mg a discussed the possibility of discontinuing this altogether in the future depending upon subsequent levels.  In March 2017 he underwent a repeat echo Doppler study which showed mild LVH with normal systolic function without wall motion abnormalities.  There was very mild aortic regurgitation.  His aortic root dimension measured 39 mm.  His left atrium was mildly dilated.  Pulmonary pressures were normal.  He also underwent a repeat lipoprofile which showed an LDL particle #856.  He had 136 small LDL particles was normal.  Total cholesterol was 127, HDL 55, triglycerides 57, and LDL 61.  Apo lipoprotein B was 54 and LPa was 66.  In May 2018  developed a new onset atrial flutter.  He was hospitalized; TEE showed mild dilation of his atrium without thrombus.  He underwent atrial flutter ablation by [name] and was found to have isthmus dependent right atrial flutter.  He was started on eliquis doing his presentation.  He saw [name] in follow-up on 03/07/2017 and  eliquis was discontinued at that time and he was told to resume aspirin.  He was questioning whether or not to take Plavix again.  He also had an ACE-induced cough and lisinopril was discontinued and he was started on low-dose losartan.    When I saw him in March 2019 , he was doing well and was without recurrent anginal symptoms or any recurrent atrial arrhythmias.  He continues have problems with his neuropathy  Because of the neuropathy he was walking approximately 2-3 miles per day  instead of 3-5 miles as he had in the past.  He continued to use Lyrica and Nucynta.  He was now back on Plavix and aspirin particular with his left main stent.  He is followed by [name] who checks his laboratory.  Most recent labs from 11/28/17 : TC 125, triglycerides 40, HDL 60, and LDL 50.  He has continued to be on Crestor, lovaza, and niacin with remote very low HDL levels and slightly icreased LP(a).  Earlier this week he apparently began to notice some left arm discomfort which was not clearly exertionally precipitated.  He describes it as feeling like he had just gotten a tetanus shot.  He denied any associated chest pressure.  However, when he walked up steps he noticed some mild increased shortness of breath.  He ultimately presented to urgent care where ECG was done.  With his CAD he was advised to go to the emergency room.  In the emergency room laboratory was drawn.  Troponins were negative.  He waited 9 hours but was never seen and ultimately left.  He called the office and was added onto my schedule to be seen today.  Of note, yesterday he walked 2-1/2 miles on the treadmill at home and denied any exercise precipitated arm pain.   Past Surgical History:  Procedure Laterality Date   A-FLUTTER ABLATION N/A 02/05/2017   Procedure: A-Flutter Ablation;  Surgeon: Thompson Grayer, MD;  Location: [facility];  Service: Cardiovascular;  Laterality: N/A;   CARDIAC CATHETERIZATION  12/13/2011   No intervention - recommend medical therapy with increased medical trial   CARDIOVASCULAR STRESS TEST  10/26/2010   No scintigraphic evidence of inducible myocardial ischemia. No ECG changes. EKG negative for ischemia.   CORONARY ARTERY BYPASS GRAFT  1997   LIMA-LAD, vein graft to diagonal, vein graft to obtuse marginal, and vein graft to RCA   LEFT HEART CATHETERIZATION WITH CORONARY/GRAFT ANGIOGRAM N/A 12/13/2011   Procedure: LEFT HEART CATHETERIZATION WITH Beatrix Fetters;  Surgeon:  Troy Sine, MD;  [facility];  Service: Cardiovascular;  Laterality: N/A;   LOWER EXTREMITY ARTERIAL DOPPLER  01/30/2000   Normal arterial study   TEE WITHOUT CARDIOVERSION N/A 02/04/2017   Procedure: TRANSESOPHAGEAL ECHOCARDIOGRAM (TEE);  Surgeon: Dorothy Spark, MD;  Location: [facility] ENDOSCOPY;  Service: Cardiovascular;  Laterality: N/A;   TRANSTHORACIC ECHOCARDIOGRAM  12/12/2011   EF >55%, LA severely dilated.    Allergies  Allergen Reactions   Nortriptyline Other (See Comments)    Groggy at low dose   Cymbalta [Duloxetine Hcl] Diarrhea    Current Outpatient Medications  Medication Sig Dispense Refill   Ascorbic Acid (VITAMIN C PO) Take 1 tablet by mouth daily.     aspirin EC 81 MG tablet Take 1 tablet (81 mg total) by mouth daily. 90 tablet 3   carvedilol (COREG) 6.25 MG tablet TAKE 1 TABLET(6.25 MG) BY MOUTH TWICE DAILY WITH A MEAL 180 tablet 3   celecoxib (CELEBREX) 100  MG capsule Take 100 mg by mouth daily.     Cholecalciferol (VITAMIN D3) 5000 units CAPS Take 5,000-10,000 Units by mouth See admin instructions. Take 5,000 units by mouth once a day and rotate with 10,000 units every other day     clopidogrel (PLAVIX) 75 MG tablet Take 1 tablet (75 mg total) by mouth daily. 90 tablet 3   Coenzyme Q10 (CO Q-10 PO) Take 1 capsule by mouth daily.     Ferrous Gluconate-C-Folic Acid (IRON-C PO) Take 1 capsule by mouth 2 (two) times a week.     isosorbide mononitrate (IMDUR) 30 MG 24 hr tablet Take 1 tablet (30 mg total) by mouth daily. 90 tablet 3   L-Methylfolate-B6-B12 (METANX PO) Take 1 tablet by mouth daily.      niacin (NIASPAN) 1000 MG CR tablet Take 1 tablet (1,000 mg total) by mouth at bedtime. 90 tablet 3   nitroGLYCERIN (NITROLINGUAL) 0.4 MG/SPRAY spray Place 1 spray under the tongue every 5 (five) minutes x 3 doses as needed for chest pain. 12 g 11   NUCYNTA ER 200 MG TB12 Take 200 mg by mouth 2 (two) times daily.  0   omega-3 acid ethyl esters  (LOVAZA) 1 g capsule Take 2 capsules (2 g total) by mouth daily. 180 capsule 3   oxyCODONE-acetaminophen (PERCOCET/ROXICET) 5-325 MG tablet Take 1 tablet by mouth daily as needed for moderate pain.      pioglitazone (ACTOS) 45 MG tablet Take 45 mg by mouth daily.     pregabalin (LYRICA) 200 MG capsule Take 200 mg by mouth 3 (three) times daily.      Quercetin 250 MG TABS Take 500 mg by mouth 2 (two) times daily.     ranolazine (RANEXA) 1000 MG SR tablet Take 1 tablet (1,000 mg total) 2 (two) times daily by mouth. 180 tablet 2   rosuvastatin (CRESTOR) 40 MG tablet Take 1 tablet (40 mg total) by mouth daily. (Patient taking differently: Take 40 mg by mouth at bedtime. ) 90 tablet 3   saxagliptin HCl (ONGLYZA) 5 MG TABS tablet Take 5 mg by mouth daily.     UNABLE TO FIND Nuclezyme-Forte capsules: Take 1 capsule by mouth once a day     UNABLE TO FIND Liquid Turmeric: Take 5 ml's by mouth once a day     VITAMIN A PO Take 1 capsule by mouth daily.     vitamin E (VITAMIN E) 400 UNIT capsule Take 400 Units by mouth daily.     losartan (COZAAR) 25 MG tablet Take 1 tablet (25 mg total) by mouth daily. 90 tablet 3   No current facility-administered medications for this visit.     Social History   Socioeconomic History   Marital status: Married    Spouse name: Not on file   Number of children: Not on file   Years of education: Not on file   Highest education level: Not on file  Occupational History   Not on file  Social Needs   Financial resource strain: Not on file   Food insecurity:    Worry: Not on file    Inability: Not on file   Transportation needs:    Medical: Not on file    Non-medical: Not on file  Tobacco Use   Smoking status: Never Smoker   Smokeless tobacco: Never Used  Substance and Sexual Activity   Alcohol use: Yes   Drug use: Yes   Sexual activity: Not on file  Lifestyle  Physical activity:    Days per week: Not on file    Minutes per  session: Not on file   Stress: Not on file  Relationships   Social connections:    Talks on phone: Not on file    Gets together: Not on file    Attends religious service: Not on file    Active member of club or organization: Not on file    Attends meetings of clubs or organizations: Not on file    Relationship status: Not on file   Intimate partner violence:    Fear of current or ex partner: Not on file    Emotionally abused: Not on file    Physically abused: Not on file    Forced sexual activity: Not on file  Other Topics Concern   Not on file  Social History Narrative   Not on file    Family History  Problem Relation Age of Onset   Heart disease Father    Diabetes Father    Hyperlipidemia Sister    Socially he is married. His former wife  was Dory Larsen, an [employer] agent. He has been married for several years with his present wife and has 2 stepchildren with her. He does exercise least 4-5 days per week. He has now retired. Previously he was an Optometrist for [employer].   ROS General: Negative; No fevers, chills, or night sweats;  HEENT: Negative; No changes in vision or hearing, sinus congestion, difficulty swallowing Pulmonary: Negative; No cough, wheezing, shortness of breath, hemoptysis Cardiovascular: See HPI GI: Negative; No nausea, vomiting, diarrhea, or abdominal pain GU: Negative; No dysuria, hematuria, or difficulty voiding Musculoskeletal: Negative; no myalgias, joint pain, or weakness Hematologic/Oncology: Negative; no easy bruising, bleeding Endocrine: Positive for diabetes mellitus, type II; no heat/cold intolerance;  Neuro: Positive for peripheral neuropathy; no changes in balance, headaches Skin: Negative; No rashes or skin lesions Psychiatric: Negative; No behavioral problems, depression Sleep: Negative; No snoring, daytime sleepiness, hypersomnolence, bruxism, restless legs, hypnogognic hallucinations, no cataplexy Other  comprehensive 14 point system review is negative.  PE BP 139/73    Pulse (!) 57    Ht _0  (1.778 m)    Wt 213 lb (96.6 kg)    BMI 30.56 kg/m    Repeat blood pressure was 138/70  Wt Readings from Last 3 Encounters:  03/07/18 213 lb (96.6 kg)  03/04/18 202 lb (91.6 kg)  12/19/17 211 lb (95.7 kg)    General: Alert, oriented, no distress.  Skin: normal turgor, no rashes, warm and dry HEENT: Normocephalic, atraumatic. Pupils equal round and reactive to light; sclera anicteric; extraocular muscles intact;  Nose without nasal septal hypertrophy Mouth/Parynx benign; Mallinpatti scale 3 Neck: No JVD, no carotid bruits; normal carotid upstroke Lungs: clear to ausculatation and percussion; no wheezing or rales Chest wall: without tenderness to palpitation Heart: PMI not displaced, RRR, s1 s2 normal, 1/6 systolic murmur, no diastolic murmur, no rubs, gallops, thrills, or heaves Abdomen: soft, nontender; no hepatosplenomehaly, BS+; abdominal aorta nontender and not dilated by palpation. Back: no CVA tenderness Pulses 2+ Musculoskeletal: No discomfort to palpation of his left upper arm; full range of motion, normal strength, no joint deformities Extremities: no clubbing cyanosis or edema, Homan's sign negative  Neurologic: grossly nonfocal; Cranial nerves grossly wnl Psychologic: Normal mood and affect   ECG (independently read by me): From March 04, 2018 at the emergency room/urgent care: Normal sinus rhythm at 60 bpm with first-degree AV block.  PR interval 258 ms.  Left axis deviation.  Poor R wave progression V1 through V3.  No acute abnormalities.  March 2019 ECG (independently read by me): sinus bradycardia 54 bpm.  First degree AV block.  Nonspecific T waves.  PR interval 268 ms  September 2018 ECG (independently read by me): Sinus bradycardia at 55 bpm with first-degree AV block.  PR interval 264 ms.  Nonspecific T changes inferiorly.  November 2017 ECG (independently read by me):  Sinus bradycardia at 59 bpm.  First degree AV block with a PR interval at 23 ms.  Incomplete right bundle branch block.  No significant ST segment changes.  September 2015 ECG (independently read by me): Sinus bradycardia 55 beats per minute.  First degree AV block with PR interval 258 ms.  Mild RV conduction delay.  Nonspecific T changes.  Prior 07/13/2013 ECG: Sinus bradycardia 54 beats per minute with first-degree AV block. Nonspecific T changes.  LABS: BMP Latest Ref Rng & Units 03/04/2018 06/27/2017 02/03/2017  Glucose 65 - 99 mg/dL 106(H) 110(H) 121(H)  BUN 6 - 20 mg/dL _0 Creatinine 0.61 - 1.24 mg/dL 0.90 0.85 0.83  BUN/Creat Ratio 10 - 24 - 18 -  Sodium 135 - 145 mmol/L 139 140 138  Potassium 3.5 - 5.1 mmol/L 4.4 4.3 3.9  Chloride 101 - 111 mmol/L 104 102 106  CO2 22 - 32 mmol/L _1 Calcium 8.9 - 10.3 mg/dL 9.5 8.8 8.5(L)   Hepatic Function Latest Ref Rng & Units 03/04/2018 06/27/2017 11/09/2015  Total Protein 6.5 - 8.1 g/dL 6.7 6.5 6.3  Albumin 3.5 - 5.0 g/dL 4.3 4.5 4.1  AST 15 - 41 U/L 40 29 36(H)  ALT 17 - 63 U/L _2 Alk Phosphatase 38 - 126 U/L 93 100 81  Total Bilirubin 0.3 - 1.2 mg/dL 1.3(H) 1.2 1.0   CBC Latest Ref Rng & Units 03/04/2018 06/27/2017 02/03/2017  WBC 4.0 - 10.5 K/uL 5.8 3.8 6.4  Hemoglobin 13.0 - 17.0 g/dL 13.9 14.8 13.0  Hematocrit 39.0 - 52.0 % 42.5 43.0 38.8(L)  Platelets 150 - 400 K/uL 153 146(L) 131(L)   Lab Results  Component Value Date   MCV 94.9 03/04/2018   MCV 91 06/27/2017   MCV 91.7 02/03/2017   Lab Results  Component Value Date   TSH 1.770 06/27/2017   Lab Results  Component Value Date   HGBA1C 5.5 06/27/2017    Lipid Panel     Component Value Date/Time   CHOL 114 06/27/2017 0810   CHOL 127 11/09/2015 0854   CHOL 107 02/12/2014 0801   TRIG 44 06/27/2017 0810   TRIG 57 11/09/2015 0854   TRIG 37 02/12/2014 0801   HDL 57 06/27/2017 0810   HDL 55 11/09/2015 0854   HDL 57 02/12/2014 0801   CHOLHDL 2.0 06/27/2017  0810   CHOLHDL 2.3 11/09/2015 0854   CHOLHDL 2.0 09/29/2014 0853   VLDL 10 09/29/2014 0853   LDLCALC 48 06/27/2017 0810   LDLCALC 61 11/09/2015 0854   LDLCALC 43 02/12/2014 0801    RADIOLOGY: No results found.  IMPRESSION:  1. CAD in native artery   2. Hx of CABG   3. Mixed hyperlipidemia   4. Essential hypertension   5. Peripheral polyneuropathy   6. Atypical chest pain     ASSESSMENT AND PLAN: Mr. Tarvaris Puglia is a 72 year old white male who underwent CABG revascularization surgery in 1997 at age 74; and is status post initial stenting of the graft to the  circumflex vessel in 2004 which subsequently was found to be occluded. In December 2009 underwent stenting of his left main into the superior ramus branch and stenting to the vein graft supplying the RCA.  At that evaluation the graft to the circumflex ws occluded  At last catheterization in March 2013 the stent to the left main/ramus vessel was widely patent as was the stent to the vein graft to the right coronary artery. His proximal LAD was being supplied by his diagonal vessel and the mid LAD wassupplied by the LIMA graft.  His last nuclear perfusion study was in March 2015 which showed normal perfusion without scar or ischemia on his medical therapy.  He  developed atrial flutter in 2018 which was isthmus dependent right atrial flutter and was successfully ablated.  He was on eliquis anticoagulation for one month. When I last saw him, he had been on aspirin alone and with his left main stent I also recommended resumption of Plavix.  He has been stable without significant previous anginal symptoms on a medical regimen consisting of carvedilol 6.25 mg twice a day, isosorbide 30 mg, ranolazine 1000 mg twice a day in addition to his losartan.  I reviewed his recent history leading to his urgent care and ER evaluation.  Apparently he was never seen by a physician in the emergency room.  Laboratory revealed a glucose of 106.  Renal function  was stable.  Troponins were negative x2.  He was not anemic with a hemoglobin of 13.9 and hematocrit 42.5.  Suspect his recent left arm comfort which was more in the region of the triceps muscle is musculoskeletal rather than ischemic in etiology.  However, with his established CAD, I have recommended he undergo an ischemic evaluation.  His last nuclear perfusion status was in March 2015.  I will schedule him for an exercise Myoview study to make certain there is no interval change from his last evaluation 4 years previously.  He continues to be on aggressive lipid-lowering therapy with his prior history of significant increased LDL particle number, very low HDL, and increased LP(a).  [name] has been checking laboratory every 3 months.  I will see him in follow-up of his nuclear perfusion study and further recommendations were made at that time.  Time spent: 25 minutes Troy Sine, MD, [facility]  03/08/2018 1:18 PM

## 2018-03-08 ENCOUNTER — Encounter: Payer: Self-pay | Admitting: Cardiovascular Disease

## 2018-03-11 ENCOUNTER — Telehealth (HOSPITAL_COMMUNITY): Payer: Self-pay

## 2018-03-11 NOTE — Telephone Encounter (Signed)
Encounter complete. 

## 2018-03-12 ENCOUNTER — Telehealth (HOSPITAL_COMMUNITY): Payer: Self-pay

## 2018-03-12 NOTE — Telephone Encounter (Signed)
Encounter complete. 

## 2018-03-13 ENCOUNTER — Ambulatory Visit (HOSPITAL_COMMUNITY)
Admission: RE | Admit: 2018-03-13 | Discharge: 2018-03-13 | Disposition: A | Discharge status: Home / Self Care | Payer: Medicare Other | Source: Ambulatory Visit | Attending: Cardiology | Admitting: Cardiology

## 2018-03-13 DIAGNOSIS — I2510 Atherosclerotic heart disease of native coronary artery without angina pectoris: Secondary | ICD-10-CM | POA: Diagnosis not present

## 2018-03-13 DIAGNOSIS — Z951 Presence of aortocoronary bypass graft: Secondary | ICD-10-CM | POA: Diagnosis not present

## 2018-03-13 LAB — MYOCARDIAL PERFUSION IMAGING
CHL CUP NUCLEAR SDS: 1
CHL CUP RESTING HR STRESS: 54 {beats}/min
LV dias vol: 166 mL (ref 62–150)
LV sys vol: 74 mL
Peak HR: 75 {beats}/min
SRS: 1
SSS: 2
TID: 1.18

## 2018-03-13 MED ORDER — TECHNETIUM TC 99M TETROFOSMIN IV KIT
10.3000 | PACK | Freq: Once | INTRAVENOUS | Status: AC | PRN
  Administered 2018-03-13: 10.3 via INTRAVENOUS
  Filled 2018-03-13: qty 11

## 2018-03-13 MED ORDER — REGADENOSON 0.4 MG/5ML IV SOLN
0.4000 mg | Freq: Once | INTRAVENOUS | Status: AC
  Administered 2018-03-13: 0.4 mg via INTRAVENOUS

## 2018-03-13 MED ORDER — TECHNETIUM TC 99M TETROFOSMIN IV KIT
31.0000 | PACK | Freq: Once | INTRAVENOUS | Status: AC | PRN
  Administered 2018-03-13: 31 via INTRAVENOUS
  Filled 2018-03-13: qty 31

## 2018-03-21 DIAGNOSIS — G609 Hereditary and idiopathic neuropathy, unspecified: Secondary | ICD-10-CM | POA: Diagnosis not present

## 2018-03-21 DIAGNOSIS — Z79891 Long term (current) use of opiate analgesic: Secondary | ICD-10-CM | POA: Diagnosis not present

## 2018-03-21 DIAGNOSIS — G894 Chronic pain syndrome: Secondary | ICD-10-CM | POA: Diagnosis not present

## 2018-03-26 DIAGNOSIS — M9901 Segmental and somatic dysfunction of cervical region: Secondary | ICD-10-CM | POA: Diagnosis not present

## 2018-03-26 DIAGNOSIS — M5030 Other cervical disc degeneration, unspecified cervical region: Secondary | ICD-10-CM | POA: Diagnosis not present

## 2018-03-26 DIAGNOSIS — M9903 Segmental and somatic dysfunction of lumbar region: Secondary | ICD-10-CM | POA: Diagnosis not present

## 2018-03-26 DIAGNOSIS — M9902 Segmental and somatic dysfunction of thoracic region: Secondary | ICD-10-CM | POA: Diagnosis not present

## 2018-03-26 DIAGNOSIS — M5136 Other intervertebral disc degeneration, lumbar region: Secondary | ICD-10-CM | POA: Diagnosis not present

## 2018-03-26 DIAGNOSIS — M9904 Segmental and somatic dysfunction of sacral region: Secondary | ICD-10-CM | POA: Diagnosis not present

## 2018-03-31 DIAGNOSIS — M9902 Segmental and somatic dysfunction of thoracic region: Secondary | ICD-10-CM | POA: Diagnosis not present

## 2018-03-31 DIAGNOSIS — M9903 Segmental and somatic dysfunction of lumbar region: Secondary | ICD-10-CM | POA: Diagnosis not present

## 2018-03-31 DIAGNOSIS — M9901 Segmental and somatic dysfunction of cervical region: Secondary | ICD-10-CM | POA: Diagnosis not present

## 2018-03-31 DIAGNOSIS — M5030 Other cervical disc degeneration, unspecified cervical region: Secondary | ICD-10-CM | POA: Diagnosis not present

## 2018-03-31 DIAGNOSIS — M5136 Other intervertebral disc degeneration, lumbar region: Secondary | ICD-10-CM | POA: Diagnosis not present

## 2018-03-31 DIAGNOSIS — M9904 Segmental and somatic dysfunction of sacral region: Secondary | ICD-10-CM | POA: Diagnosis not present

## 2018-04-10 DIAGNOSIS — M216X1 Other acquired deformities of right foot: Secondary | ICD-10-CM | POA: Diagnosis not present

## 2018-04-10 DIAGNOSIS — M79672 Pain in left foot: Secondary | ICD-10-CM | POA: Diagnosis not present

## 2018-04-10 DIAGNOSIS — M79671 Pain in right foot: Secondary | ICD-10-CM | POA: Diagnosis not present

## 2018-04-10 DIAGNOSIS — M216X2 Other acquired deformities of left foot: Secondary | ICD-10-CM | POA: Diagnosis not present

## 2018-05-09 DIAGNOSIS — M9903 Segmental and somatic dysfunction of lumbar region: Secondary | ICD-10-CM | POA: Diagnosis not present

## 2018-05-09 DIAGNOSIS — M5136 Other intervertebral disc degeneration, lumbar region: Secondary | ICD-10-CM | POA: Diagnosis not present

## 2018-05-09 DIAGNOSIS — M9901 Segmental and somatic dysfunction of cervical region: Secondary | ICD-10-CM | POA: Diagnosis not present

## 2018-05-09 DIAGNOSIS — M5030 Other cervical disc degeneration, unspecified cervical region: Secondary | ICD-10-CM | POA: Diagnosis not present

## 2018-05-09 DIAGNOSIS — M9902 Segmental and somatic dysfunction of thoracic region: Secondary | ICD-10-CM | POA: Diagnosis not present

## 2018-05-28 ENCOUNTER — Other Ambulatory Visit: Payer: Self-pay | Admitting: *Deleted

## 2018-05-28 DIAGNOSIS — Z951 Presence of aortocoronary bypass graft: Secondary | ICD-10-CM

## 2018-05-28 DIAGNOSIS — E782 Mixed hyperlipidemia: Secondary | ICD-10-CM

## 2018-05-28 DIAGNOSIS — I10 Essential (primary) hypertension: Secondary | ICD-10-CM

## 2018-05-28 DIAGNOSIS — I4892 Unspecified atrial flutter: Secondary | ICD-10-CM

## 2018-05-28 DIAGNOSIS — I2510 Atherosclerotic heart disease of native coronary artery without angina pectoris: Secondary | ICD-10-CM

## 2018-05-28 DIAGNOSIS — G629 Polyneuropathy, unspecified: Secondary | ICD-10-CM

## 2018-05-28 DIAGNOSIS — E1140 Type 2 diabetes mellitus with diabetic neuropathy, unspecified: Secondary | ICD-10-CM

## 2018-05-28 DIAGNOSIS — Z79899 Other long term (current) drug therapy: Secondary | ICD-10-CM

## 2018-06-06 DIAGNOSIS — H52203 Unspecified astigmatism, bilateral: Secondary | ICD-10-CM | POA: Diagnosis not present

## 2018-06-06 DIAGNOSIS — H25013 Cortical age-related cataract, bilateral: Secondary | ICD-10-CM | POA: Diagnosis not present

## 2018-06-06 DIAGNOSIS — H43813 Vitreous degeneration, bilateral: Secondary | ICD-10-CM | POA: Diagnosis not present

## 2018-06-06 DIAGNOSIS — E119 Type 2 diabetes mellitus without complications: Secondary | ICD-10-CM | POA: Diagnosis not present

## 2018-06-11 DIAGNOSIS — E782 Mixed hyperlipidemia: Secondary | ICD-10-CM | POA: Diagnosis not present

## 2018-06-11 DIAGNOSIS — I10 Essential (primary) hypertension: Secondary | ICD-10-CM | POA: Diagnosis not present

## 2018-06-11 DIAGNOSIS — G629 Polyneuropathy, unspecified: Secondary | ICD-10-CM | POA: Diagnosis not present

## 2018-06-11 DIAGNOSIS — Z951 Presence of aortocoronary bypass graft: Secondary | ICD-10-CM | POA: Diagnosis not present

## 2018-06-11 DIAGNOSIS — I4892 Unspecified atrial flutter: Secondary | ICD-10-CM | POA: Diagnosis not present

## 2018-06-11 DIAGNOSIS — I2510 Atherosclerotic heart disease of native coronary artery without angina pectoris: Secondary | ICD-10-CM | POA: Diagnosis not present

## 2018-06-11 DIAGNOSIS — E1140 Type 2 diabetes mellitus with diabetic neuropathy, unspecified: Secondary | ICD-10-CM | POA: Diagnosis not present

## 2018-06-11 DIAGNOSIS — Z79899 Other long term (current) drug therapy: Secondary | ICD-10-CM | POA: Diagnosis not present

## 2018-06-12 LAB — LIPID PANEL
Chol/HDL Ratio: 2.3 ratio (ref 0.0–5.0)
Cholesterol, Total: 125 mg/dL (ref 100–199)
HDL: 55 mg/dL (ref >39)
LDL Calculated: 59 mg/dL (ref 0–99)
Triglycerides: 53 mg/dL (ref 0–149)
VLDL CHOLESTEROL CAL: 11 mg/dL (ref 5–40)

## 2018-06-12 LAB — COMPREHENSIVE METABOLIC PANEL
ALBUMIN: 4.4 g/dL (ref 3.5–4.8)
ALT: 20 IU/L (ref 0–44)
AST: 24 IU/L (ref 0–40)
Albumin/Globulin Ratio: 2.4 — ABNORMAL HIGH (ref 1.2–2.2)
Alkaline Phosphatase: 91 IU/L (ref 39–117)
BUN/Creatinine Ratio: 14 (ref 10–24)
BUN: 12 mg/dL (ref 8–27)
Bilirubin Total: 0.9 mg/dL (ref 0.0–1.2)
CO2: 22 mmol/L (ref 20–29)
CREATININE: 0.85 mg/dL (ref 0.76–1.27)
Calcium: 8.9 mg/dL (ref 8.6–10.2)
Chloride: 103 mmol/L (ref 96–106)
GFR calc Af Amer: 102 mL/min/{1.73_m2} (ref >59)
GFR calc non Af Amer: 88 mL/min/{1.73_m2} (ref >59)
GLUCOSE: 122 mg/dL — AB (ref 65–99)
Globulin, Total: 1.8 g/dL (ref 1.5–4.5)
Potassium: 3.9 mmol/L (ref 3.5–5.2)
SODIUM: 137 mmol/L (ref 134–144)
TOTAL PROTEIN: 6.2 g/dL (ref 6.0–8.5)

## 2018-06-12 LAB — CBC
HEMATOCRIT: 40.5 % (ref 37.5–51.0)
Hemoglobin: 13.5 g/dL (ref 13.0–17.7)
MCH: 31.9 pg (ref 26.6–33.0)
MCHC: 33.3 g/dL (ref 31.5–35.7)
MCV: 96 fL (ref 79–97)
PLATELETS: 142 10*3/uL — AB (ref 150–450)
RBC: 4.23 x10E6/uL (ref 4.14–5.80)
RDW: 15.7 % — ABNORMAL HIGH (ref 12.3–15.4)
WBC: 3.8 10*3/uL (ref 3.4–10.8)

## 2018-06-12 LAB — TSH: TSH: 1.87 u[IU]/mL (ref 0.450–4.500)

## 2018-06-12 LAB — LIPOPROTEIN A (LPA): Lipoprotein (a): 62.9 nmol/L (ref <75.0)

## 2018-06-13 DIAGNOSIS — G894 Chronic pain syndrome: Secondary | ICD-10-CM | POA: Diagnosis not present

## 2018-06-13 DIAGNOSIS — Z79891 Long term (current) use of opiate analgesic: Secondary | ICD-10-CM | POA: Diagnosis not present

## 2018-06-13 DIAGNOSIS — Z5181 Encounter for therapeutic drug level monitoring: Secondary | ICD-10-CM | POA: Diagnosis not present

## 2018-06-13 DIAGNOSIS — Z79899 Other long term (current) drug therapy: Secondary | ICD-10-CM | POA: Diagnosis not present

## 2018-06-13 DIAGNOSIS — G609 Hereditary and idiopathic neuropathy, unspecified: Secondary | ICD-10-CM | POA: Diagnosis not present

## 2018-06-18 ENCOUNTER — Ambulatory Visit (INDEPENDENT_AMBULATORY_CARE_PROVIDER_SITE_OTHER): Payer: Medicare Other | Admitting: Cardiovascular Disease

## 2018-06-18 ENCOUNTER — Encounter: Payer: Self-pay | Admitting: Cardiovascular Disease

## 2018-06-18 VITALS — BP 114/62 | HR 50 | Ht 70.0 in | Wt 209.0 lb

## 2018-06-18 DIAGNOSIS — E1140 Type 2 diabetes mellitus with diabetic neuropathy, unspecified: Secondary | ICD-10-CM | POA: Diagnosis not present

## 2018-06-18 DIAGNOSIS — E782 Mixed hyperlipidemia: Secondary | ICD-10-CM | POA: Diagnosis not present

## 2018-06-18 DIAGNOSIS — G629 Polyneuropathy, unspecified: Secondary | ICD-10-CM | POA: Diagnosis not present

## 2018-06-18 DIAGNOSIS — Z79899 Other long term (current) drug therapy: Secondary | ICD-10-CM

## 2018-06-18 DIAGNOSIS — I10 Essential (primary) hypertension: Secondary | ICD-10-CM | POA: Diagnosis not present

## 2018-06-18 DIAGNOSIS — I2510 Atherosclerotic heart disease of native coronary artery without angina pectoris: Secondary | ICD-10-CM | POA: Diagnosis not present

## 2018-06-18 DIAGNOSIS — I483 Typical atrial flutter: Secondary | ICD-10-CM

## 2018-06-18 DIAGNOSIS — Z951 Presence of aortocoronary bypass graft: Secondary | ICD-10-CM | POA: Diagnosis not present

## 2018-06-18 MED ORDER — OLMESARTAN MEDOXOMIL 20 MG PO TABS: 20.0000 mg | ORAL_TABLET | Freq: Every day | ORAL | 3 refills | Status: DC

## 2018-06-18 NOTE — Progress Notes (Signed)
Patient ID: Ruben [name], male   DOB: [birth year], 72 y.o.   MRN: [id number]    PCP: Dr. Dagmar Hait  HPI: Ruben [name] is a 72 y.o. male who presents for a 3 month cardiology followup evaluation.  Ruben [name] has established CAD and in 1997 underwent CABG surgery with a LIMA to the LAD, vein to the diagonal, vein to the obtuse marginal, and vein to the right coronary artery. In November 2004 he underwent stenting to the vein graft to the circumflex vessel. In December 2009 the graft to the circumflex was occluded and he underwent stenting of the left main into the superior ramus intermediate vessel. At that time he also underwent stenting of the vein graft to the right coronary artery. His last catheterization which was done for repeat chest pain in March 2013 showed patent stents. He has known occlusion of the graft that supplied the circumflex marginal vessel. The graft to diagonal vessel was patent and filled the proximal LAD system and the LIMA graft which filled the mid LAD system was widely patent.  He has ahistory of mixed hyperlipidemia and has been on aggressive lipid-lowering therapy over the past 18 years. He also has developed a peripheral neuropathy. He has mild diabetes mellitus and is now followed by [name]. In April 2014 which labs showed a total cholesterol was 125, triglycerides 52, HDL 55, LDL 52 on aggressive combination therapy consisting of Niaspan 2 g daily, Crestor 40 mg daily, Lovaza 2 g daily.  Initially, we had followed him with [facility] heart lab assessments.  On 12/08/2013 a followup nuclear perfusion study continued to show normal perfusion without scar or ischemia on his current medical therapy.  Ejection fraction was 64%.  He is retired for several years and with more time he exercises regularly and walks up to 3 -5 miles per day approximately 4-5 days per week and does yoga 3 days per week.  He denies any recurrent anginal symptoms.  He denies any shortness of breath.   He denies PND, orthopnea.  He denies palpitations.   Laboratory in May 2015 showed significant aggressive lipid management with an LDL particle number now at 371 and calculated LDL 43, HDL 57, triglycerides 37, total cholesterol 107.  Small LDL particles of 156.  He still had an increased VLDL size.  Insulin resistance score was less than 25.  At that time, I recommended he reduce his Niaspan from 2000 mg to 1000 mg a discussed the possibility of discontinuing this altogether in the future depending upon subsequent levels.  In March 2017 he underwent a repeat echo Doppler study which showed mild LVH with normal systolic function without wall motion abnormalities.  There was very mild aortic regurgitation.  His aortic root dimension measured 39 mm.  His left atrium was mildly dilated.  Pulmonary pressures were normal.  He also underwent a repeat lipoprofile which showed an LDL particle #856.  He had 136 small LDL particles was normal.  Total cholesterol was 127, HDL 55, triglycerides 57, and LDL 61.  Apo lipoprotein B was 54 and LPa was 66.  In May 2018  developed a new onset atrial flutter.  He was hospitalized; TEE showed mild dilation of his atrium without thrombus.  He underwent atrial flutter ablation by [name] and was found to have isthmus dependent right atrial flutter.  He was started on eliquis doing his presentation.  He saw [name] in follow-up on 03/07/2017 and eliquis was discontinued at that time and  he was told to resume aspirin.  He was questioning whether or not to take Plavix again.  He also had an ACE-induced cough and lisinopril was discontinued and he was started on low-dose losartan.    When I saw him in March 2019 , he was doing well and was without recurrent anginal symptoms or any recurrent atrial arrhythmias.  He continues have problems with his neuropathy  Because of the neuropathy he was walking approximately 2-3 miles per day instead of 3-5 miles as he had in the past.  He  continued to use Lyrica and Nucynta.  He was now back on Plavix and aspirin particular with his left main stent.  He is followed by [name] who checks his laboratory.  Most recent labs from 11/28/17 : TC 125, triglycerides 40, HDL 60, and LDL 50.  He has continued to be on Crestor, lovaza, and niacin with remote very low HDL levels and slightly icreased LP(a).  Prior to seeing him at his last office visit in June 2019 he  began to notice some left arm discomfort which was not clearly exertionally precipitated.  He describes it as feeling like he had just gotten a tetanus shot.  He denied any associated chest pressure.  However, when he walked up steps he noticed some mild increased shortness of breath.  He ultimately presented to urgent care where ECG was done.  With his CAD he was advised to go to the emergency room.  In the emergency room laboratory was drawn.  Troponins were negative.  He waited 9 hours but was never seen and ultimately left.  He called the office and was added onto my schedule.  In light of his recent chest pain with some atypical features I scheduled him for a nuclear perfusion study which was done on March 13, 2018.  Ejection fraction was 55%.  He did not develop any chest pain or ECG changes.  He had normal perfusion without evidence for scar or ischemia.  He underwent recent laboratory which has shown a cholesterol 125, triglycerides 53, HDL 55, LDL 59 on his current medical regimen.  LP(a) was 62.9.  He presents for evaluation.  Past Surgical History:  Procedure Laterality Date   A-FLUTTER ABLATION N/A 02/05/2017   Procedure: A-Flutter Ablation;  Surgeon: Thompson Grayer, MD;  Location: [facility];  Service: Cardiovascular;  Laterality: N/A;   CARDIAC CATHETERIZATION  12/13/2011   No intervention - recommend medical therapy with increased medical trial   CARDIOVASCULAR STRESS TEST  10/26/2010   No scintigraphic evidence of inducible myocardial ischemia. No ECG changes.  EKG negative for ischemia.   CORONARY ARTERY BYPASS GRAFT  1997   LIMA-LAD, vein graft to diagonal, vein graft to obtuse marginal, and vein graft to RCA   LEFT HEART CATHETERIZATION WITH CORONARY/GRAFT ANGIOGRAM N/A 12/13/2011   Procedure: LEFT HEART CATHETERIZATION WITH Beatrix Fetters;  Surgeon: Troy Sine, MD;  Location: [facility] CATH LAB;  Service: Cardiovascular;  Laterality: N/A;   LOWER EXTREMITY ARTERIAL DOPPLER  01/30/2000   Normal arterial study   TEE WITHOUT CARDIOVERSION N/A 02/04/2017   Procedure: TRANSESOPHAGEAL ECHOCARDIOGRAM (TEE);  Surgeon: Dorothy Spark, MD;  Location: [facility] ENDOSCOPY;  Service: Cardiovascular;  Laterality: N/A;   TRANSTHORACIC ECHOCARDIOGRAM  12/12/2011   EF >55%, LA severely dilated.    Allergies  Allergen Reactions   Nortriptyline Other (See Comments)    Groggy at low dose   Cymbalta [Duloxetine Hcl] Diarrhea    Current Outpatient Medications  Medication Sig  Dispense Refill   Ascorbic Acid (VITAMIN C PO) Take 1 tablet by mouth daily.     aspirin EC 81 MG tablet Take 1 tablet (81 mg total) by mouth daily. 90 tablet 3   carvedilol (COREG) 6.25 MG tablet TAKE 1 TABLET(6.25 MG) BY MOUTH TWICE DAILY WITH A MEAL 180 tablet 3   celecoxib (CELEBREX) 100 MG capsule Take 100 mg by mouth daily.     Cholecalciferol (VITAMIN D3) 5000 units CAPS Take 5,000-10,000 Units by mouth See admin instructions. Take 5,000 units by mouth once a day and rotate with 10,000 units every other day     clopidogrel (PLAVIX) 75 MG tablet Take 1 tablet (75 mg total) by mouth daily. 90 tablet 3   Coenzyme Q10 (CO Q-10 PO) Take 1 capsule by mouth daily.     Ferrous Gluconate-C-Folic Acid (IRON-C PO) Take 1 capsule by mouth 2 (two) times a week.     isosorbide mononitrate (IMDUR) 30 MG 24 hr tablet Take 1 tablet (30 mg total) by mouth daily. 90 tablet 3   L-Methylfolate-B6-B12 (METANX PO) Take 1 tablet by mouth daily.      niacin (NIASPAN) 1000 MG CR tablet Take  1 tablet (1,000 mg total) by mouth at bedtime. 90 tablet 3   nitroGLYCERIN (NITROLINGUAL) 0.4 MG/SPRAY spray Place 1 spray under the tongue every 5 (five) minutes x 3 doses as needed for chest pain. 12 g 11   NUCYNTA ER 200 MG TB12 Take 200 mg by mouth 2 (two) times daily.  0   omega-3 acid ethyl esters (LOVAZA) 1 g capsule Take 2 capsules (2 g total) by mouth daily. 180 capsule 3   oxyCODONE-acetaminophen (PERCOCET/ROXICET) 5-325 MG tablet Take 1 tablet by mouth daily as needed for moderate pain.      pioglitazone (ACTOS) 45 MG tablet Take 45 mg by mouth daily.     pregabalin (LYRICA) 200 MG capsule Take 200 mg by mouth 3 (three) times daily.      Quercetin 250 MG TABS Take 500 mg by mouth 2 (two) times daily.     ranolazine (RANEXA) 1000 MG SR tablet Take 1 tablet (1,000 mg total) 2 (two) times daily by mouth. 180 tablet 2   rosuvastatin (CRESTOR) 40 MG tablet Take 1 tablet (40 mg total) by mouth daily. (Patient taking differently: Take 40 mg by mouth at bedtime. ) 90 tablet 3   saxagliptin HCl (ONGLYZA) 5 MG TABS tablet Take 5 mg by mouth daily.     UNABLE TO FIND Nuclezyme-Forte capsules: Take 1 capsule by mouth once a day     UNABLE TO FIND Liquid Turmeric: Take 5 ml's by mouth once a day     VITAMIN A PO Take 1 capsule by mouth daily.     vitamin E (VITAMIN E) 400 UNIT capsule Take 400 Units by mouth daily.     olmesartan (BENICAR) 20 MG tablet Take 1 tablet (20 mg total) by mouth daily. 90 tablet 3   No current facility-administered medications for this visit.     Social History   Socioeconomic History   Marital status: Married    Spouse name: Not on file   Number of children: Not on file   Years of education: Not on file   Highest education level: Not on file  Occupational History   Not on file  Social Needs   Financial resource strain: Not on file   Food insecurity:    Worry: Not on file    Inability:  Not on file   Transportation needs:    Medical:  Not on file    Non-medical: Not on file  Tobacco Use   Smoking status: Never Smoker   Smokeless tobacco: Never Used  Substance and Sexual Activity   Alcohol use: Yes   Drug use: Yes   Sexual activity: Not on file  Lifestyle   Physical activity:    Days per week: Not on file    Minutes per session: Not on file   Stress: Not on file  Relationships   Social connections:    Talks on phone: Not on file    Gets together: Not on file    Attends religious service: Not on file    Active member of club or organization: Not on file    Attends meetings of clubs or organizations: Not on file    Relationship status: Not on file   Intimate partner violence:    Fear of current or ex partner: Not on file    Emotionally abused: Not on file    Physically abused: Not on file    Forced sexual activity: Not on file  Other Topics Concern   Not on file  Social History Narrative   Not on file    Family History  Problem Relation Age of Onset   Heart disease Father    Diabetes Father    Hyperlipidemia Sister    Socially he is married. His former wife  was Dory Larsen, an iins[employer] agent. He has been married for several years with his present wife and has 2 stepchildren with her. He does exercise least 4-5 days per week. He has now retired. Previously he was an Optometrist for [employer].   ROS General: Negative; No fevers, chills, or night sweats;  HEENT: Negative; No changes in vision or hearing, sinus congestion, difficulty swallowing Pulmonary: Negative; No cough, wheezing, shortness of breath, hemoptysis Cardiovascular: See HPI GI: Negative; No nausea, vomiting, diarrhea, or abdominal pain GU: Negative; No dysuria, hematuria, or difficulty voiding Musculoskeletal: Negative; no myalgias, joint pain, or weakness Hematologic/Oncology: Negative; no easy bruising, bleeding Endocrine: Positive for diabetes mellitus, type II; no heat/cold intolerance;  Neuro: Positive  for peripheral neuropathy; no changes in balance, headaches Skin: Negative; No rashes or skin lesions Psychiatric: Negative; No behavioral problems, depression Sleep: Negative; No snoring, daytime sleepiness, hypersomnolence, bruxism, restless legs, hypnogognic hallucinations, no cataplexy Other comprehensive 14 point system review is negative.  PE BP 114/62    Pulse (!) 50    Ht 5' 10"  (1.778 m)    Wt 209 lb (94.8 kg)    BMI 29.99 kg/m    Repeat blood pressure by me was 118/64  Wt Readings from Last 3 Encounters:  06/18/18 209 lb (94.8 kg)  03/13/18 213 lb (96.6 kg)  03/07/18 213 lb (96.6 kg)   General: Alert, oriented, no distress.  Skin: normal turgor, no rashes, warm and dry HEENT: Normocephalic, atraumatic. Pupils equal round and reactive to light; sclera anicteric; extraocular muscles intact;  Nose without nasal septal hypertrophy Mouth/Parynx benign; Mallinpatti scale 3 Neck: No JVD, no carotid bruits; normal carotid upstroke Lungs: clear to ausculatation and percussion; no wheezing or rales Chest wall: without tenderness to palpitation Heart: PMI not displaced, RRR, s1 s2 normal, 1/6 systolic murmur, no diastolic murmur, no rubs, gallops, thrills, or heaves Abdomen: soft, nontender; no hepatosplenomehaly, BS+; abdominal aorta nontender and not dilated by palpation. Back: no CVA tenderness Pulses 2+ Musculoskeletal: full range of motion, normal strength, no joint  deformities Extremities: no clubbing cyanosis or edema, Homan's sign negative  Neurologic: grossly nonfocal; Cranial nerves grossly wnl Psychologic: Normal mood and affect   ECG (independently read by me): Sinus bradycardia at 50; 1st degree AV block; PR 276 msec  June 2019 ECG (independently read by me): From March 04, 2018 at the emergency room/urgent care: Normal sinus rhythm at 60 bpm with first-degree AV block.  PR interval 258 ms.  Left axis deviation.  Poor R wave progression V1 through V3.  No acute  abnormalities.  March 2019 ECG (independently read by me): sinus bradycardia 54 bpm.  First degree AV block.  Nonspecific T waves.  PR interval 268 ms  September 2018 ECG (independently read by me): Sinus bradycardia at 55 bpm with first-degree AV block.  PR interval 264 ms.  Nonspecific T changes inferiorly.  November 2017 ECG (independently read by me): Sinus bradycardia at 59 bpm.  First degree AV block with a PR interval at 23 ms.  Incomplete right bundle branch block.  No significant ST segment changes.  September 2015 ECG (independently read by me): Sinus bradycardia 55 beats per minute.  First degree AV block with PR interval 258 ms.  Mild RV conduction delay.  Nonspecific T changes.  Prior 07/13/2013 ECG: Sinus bradycardia 54 beats per minute with first-degree AV block. Nonspecific T changes.  LABS: BMP Latest Ref Rng & Units 06/11/2018 03/04/2018 06/27/2017  Glucose 65 - 99 mg/dL 122(H) 106(H) 110(H)  BUN 8 - 27 mg/dL 12 13 15   Creatinine 0.76 - 1.27 mg/dL 0.85 0.90 0.85  BUN/Creat Ratio 10 - 24 14 - 18  Sodium 134 - 144 mmol/L 137 139 140  Potassium 3.5 - 5.2 mmol/L 3.9 4.4 4.3  Chloride 96 - 106 mmol/L 103 104 102  CO2 20 - 29 mmol/L 22 27 22   Calcium 8.6 - 10.2 mg/dL 8.9 9.5 8.8   Hepatic Function Latest Ref Rng & Units 06/11/2018 03/04/2018 06/27/2017  Total Protein 6.0 - 8.5 g/dL 6.2 6.7 6.5  Albumin 3.5 - 4.8 g/dL 4.4 4.3 4.5  AST 0 - 40 IU/L 24 40 29  ALT 0 - 44 IU/L 20 25 24   Alk Phosphatase 39 - 117 IU/L 91 93 100  Total Bilirubin 0.0 - 1.2 mg/dL 0.9 1.3(H) 1.2   CBC Latest Ref Rng & Units 06/11/2018 03/04/2018 06/27/2017  WBC 3.4 - 10.8 x10E3/uL 3.8 5.8 3.8  Hemoglobin 13.0 - 17.7 g/dL 13.5 13.9 14.8  Hematocrit 37.5 - 51.0 % 40.5 42.5 43.0  Platelets 150 - 450 x10E3/uL 142(L) 153 146(L)   Lab Results  Component Value Date   MCV 96 06/11/2018   MCV 94.9 03/04/2018   MCV 91 06/27/2017   Lab Results  Component Value Date   TSH 1.870 06/11/2018   Lab Results    Component Value Date   HGBA1C 5.5 06/27/2017    Lipid Panel     Component Value Date/Time   CHOL 125 06/11/2018 0819   CHOL 127 11/09/2015 0854   CHOL 107 02/12/2014 0801   TRIG 53 06/11/2018 0819   TRIG 57 11/09/2015 0854   TRIG 37 02/12/2014 0801   HDL 55 06/11/2018 0819   HDL 55 11/09/2015 0854   HDL 57 02/12/2014 0801   CHOLHDL 2.3 06/11/2018 0819   CHOLHDL 2.3 11/09/2015 0854   CHOLHDL 2.0 09/29/2014 0853   VLDL 10 09/29/2014 0853   LDLCALC 59 06/11/2018 0819   LDLCALC 61 11/09/2015 0854   LDLCALC 43 02/12/2014 0801  RADIOLOGY: No results found.  IMPRESSION:  1. CAD in native artery   2. Hx of CABG   3. Mixed hyperlipidemia   4. Essential hypertension   5. Type 2 diabetes mellitus with diabetic neuropathy, without long-term current use of insulin ([place])   6. Medication management   7. Peripheral polyneuropathy   8. Typical atrial flutter ([place]): Isthmus dependent right atrial flutter, status post ablation     ASSESSMENT AND PLAN: Ruben [name] is a 72 year old white male who underwent CABG revascularization surgery in 1997 at age 23; and is status post initial stenting of the graft to the circumflex vessel in 2004. In December 2009 he underwent stenting of his left main into the superior ramus branch and stenting to the vein graft supplying the RCA.  At that evaluation the graft to the circumflex ws occluded  At last catheterization in March 2013 the stent to the left main/ramus vessel was widely patent as was the stent to the vein graft to the right coronary artery. His proximal LAD was being supplied by his diagonal vessel and the mid LAD wassupplied by the LIMA graft.  His nuclear perfusion study was in March 2015 showed normal perfusion without scar or ischemia on his medical therapy.  He  developed atrial flutter in 2018 which was isthmus dependent right atrial flutter and was successfully ablated.  He was on eliquis anticoagulation for one month. When I  saw  him, he had been on aspirin alone and with his left main stent I also recommended resumption of Plavix.  He has been stable without significant previous anginal symptoms on a medical regimen consisting of carvedilol 6.25 mg twice a day, isosorbide 30 mg, ranolazine 1000 mg twice a day in addition to his losartan.  When I last saw him I reviewed his emergency room evaluation.  I suspected his left arm discomfort was more in the region of the triceps muscle and was musculoskeletal rather than ischemic in etiology.  A subsequent nuclear perfusion study on March 13, 2008 continue to show normal perfusion without scar or ischemia.  His blood pressure today is stable on his current regimen consisting of carvedilol 6.25 mg twice a day, isosorbide 30 mg, losartan 25 mg addition to his ranolazine 1000 mg twice a day.  He has had issues with potential losartan recalls due to contamination and would like potentially to switch to a different agent.  I therefore recommended he discontinue losartan and in its place we will start him on olmesartan 20 mg daily.  Reviewed his recent laboratory.  His lipid studies remain excellent.  He has continued to be on rosuvastatin 40 mg, low vase at 2 capsules twice a day in addition to niacin.  In the past we also discussed the possibility of discontinuing niacin but he has preferred to stay on this since his lipids have remained stable.  He is diabetic on Glunz Onglyza Actos.  He continues to have peripheral neuropathy which is stable.  He is continuing to do well on aspirin and Plavix and denies bleeding.  He will continue current therapy.  I will see him in 6 months for reevaluation.  Time spent: 25 minutes Troy Sine, MD, [facility]  06/18/2018 6:28 PM

## 2018-06-18 NOTE — Patient Instructions (Signed)
Medication Instructions:  STOP losartan  START olmesartan 20 mg daily  Follow-Up: Your physician wants you to follow-up in: 6 months with Dr. Claiborne Billings.  You will receive a reminder letter in the mail two months in advance. If you don't receive a letter, please call our office to schedule the follow-up appointment.   Any Other Special Instructions Will Be Listed Below (If Applicable).     If you need a refill on your cardiac medications before your next appointment, please call your pharmacy.

## 2018-06-19 NOTE — Addendum Note (Signed)
Addended by: Leland Johns A on: 06/19/2018 03:51 PM   Modules accepted: Orders

## 2018-06-27 DIAGNOSIS — Z23 Encounter for immunization: Secondary | ICD-10-CM | POA: Diagnosis not present

## 2018-06-30 DIAGNOSIS — M5136 Other intervertebral disc degeneration, lumbar region: Secondary | ICD-10-CM | POA: Diagnosis not present

## 2018-06-30 DIAGNOSIS — M5030 Other cervical disc degeneration, unspecified cervical region: Secondary | ICD-10-CM | POA: Diagnosis not present

## 2018-06-30 DIAGNOSIS — M9901 Segmental and somatic dysfunction of cervical region: Secondary | ICD-10-CM | POA: Diagnosis not present

## 2018-06-30 DIAGNOSIS — M9903 Segmental and somatic dysfunction of lumbar region: Secondary | ICD-10-CM | POA: Diagnosis not present

## 2018-06-30 DIAGNOSIS — M9904 Segmental and somatic dysfunction of sacral region: Secondary | ICD-10-CM | POA: Diagnosis not present

## 2018-06-30 DIAGNOSIS — M9902 Segmental and somatic dysfunction of thoracic region: Secondary | ICD-10-CM | POA: Diagnosis not present

## 2018-07-15 DIAGNOSIS — H11002 Unspecified pterygium of left eye: Secondary | ICD-10-CM | POA: Diagnosis not present

## 2018-07-21 ENCOUNTER — Other Ambulatory Visit: Payer: Self-pay | Admitting: Cardiovascular Disease

## 2018-07-22 ENCOUNTER — Other Ambulatory Visit: Payer: Self-pay

## 2018-08-11 ENCOUNTER — Other Ambulatory Visit: Payer: Self-pay | Admitting: Cardiovascular Disease

## 2018-08-18 ENCOUNTER — Other Ambulatory Visit: Payer: Self-pay | Admitting: Cardiovascular Disease

## 2018-08-27 DIAGNOSIS — Z79891 Long term (current) use of opiate analgesic: Secondary | ICD-10-CM | POA: Diagnosis not present

## 2018-08-27 DIAGNOSIS — G894 Chronic pain syndrome: Secondary | ICD-10-CM | POA: Diagnosis not present

## 2018-08-27 DIAGNOSIS — G609 Hereditary and idiopathic neuropathy, unspecified: Secondary | ICD-10-CM | POA: Diagnosis not present

## 2018-09-03 ENCOUNTER — Ambulatory Visit (INDEPENDENT_AMBULATORY_CARE_PROVIDER_SITE_OTHER): Payer: Medicare Other | Admitting: Adult Health

## 2018-09-03 ENCOUNTER — Encounter: Payer: Self-pay | Admitting: Adult Health

## 2018-09-03 VITALS — BP 130/72 | HR 60 | Ht 70.0 in | Wt 218.0 lb

## 2018-09-03 DIAGNOSIS — E7800 Pure hypercholesterolemia, unspecified: Secondary | ICD-10-CM

## 2018-09-03 DIAGNOSIS — R5383 Other fatigue: Secondary | ICD-10-CM | POA: Diagnosis not present

## 2018-09-03 DIAGNOSIS — I2510 Atherosclerotic heart disease of native coronary artery without angina pectoris: Secondary | ICD-10-CM

## 2018-09-03 DIAGNOSIS — I4892 Unspecified atrial flutter: Secondary | ICD-10-CM

## 2018-09-03 DIAGNOSIS — I10 Essential (primary) hypertension: Secondary | ICD-10-CM | POA: Diagnosis not present

## 2018-09-03 MED ORDER — LOSARTAN POTASSIUM 25 MG PO TABS: 25.0000 mg | ORAL_TABLET | Freq: Every day | ORAL | 12 refills | Status: DC

## 2018-09-03 NOTE — Patient Instructions (Signed)
Medication Instructions:  STOP BENICAR  START LOSARTAN 25MG   If you need a refill on your cardiac medications before your next appointment, please call your pharmacy. mpletely normal, you will receive your results only by: Marland Kitchen MyChart Message (if you have MyChart) OR . A paper copy in the mail If you have any lab test that is abnormal or we need to change your treatment, we will call you to review the results.  Testing/Procedures: Your physician has recommended that you wear an event monitor. This will be performed at our [facility] location - [address].  Event monitors are medical devices that record the heart's electrical activity. Doctors most often [place] these monitors to diagnose arrhythmias. Arrhythmias are problems with the speed or rhythm of the heartbeat. The monitor is a small, portable device. You can wear one while you do your normal daily activities. This is usually used to diagnose what is causing palpitations/syncope (passing out).  Follow-Up: You will need a follow up appointment in IN 1 MONTH .   You may see  DR Valaria Good MONITOR,  [name], DNP, AACC or one of the following Advanced Practice Providers on your designated Care Team:  Almyra Deforest, [place], PA-C  At [facility], you and your health needs are our priority.  As part of our continuing mission to provide you with exceptional heart care, we have created designated Provider Care Teams.  These Care Teams include your primary Cardiologist (physician) and Advanced Practice Providers (APPs -  Physician Assistants and Nurse Practitioners) who all work together to provide you with the care you need, when you need it.  Thank you for choosing [facility]!!

## 2018-09-03 NOTE — Progress Notes (Signed)
Cardiology Office Note   Date:  09/03/2018   ID:  Ruben [name], DOB 14-Dec-1945, MRN [id number]  PCP:  Lujean Amel, MD  Cardiologist:  Claiborne Billings Chief Complaint  Patient presents with   Follow-up   Fatigue   Edema    Ankles.     History of Present Illness: Ruben [name] is a 72 y.o. male who presents for ongoing assessment and management of CAD with hx of CABG in 1997, LIMA to LAD, VG to diagonal, VG to OM and VG to the RCA.  Following this in 2004, he had PCI and stenting  To the VG to the CX and in 2009 and additional PCI of the LM into the superior ramus intermediate vessel, and stenting of the VG to the RCA. He was found to have an occluded graft to Cx.  Repeat cath in 2013 showed patent stents, the graft to diagonal vessel was patent and filled the proximal LAD system and the LIMA graft which filled the mid LAD system was widely patent. He remains on DAPT with ASA and Plavix due to LM stent.   In May of 2018 the patient developed new onset atrial flutter, and underwent atrial flutter ablation by [name] and was found to have isthmus dependent right atrial flutter. He was on Eliquis for one month and then changed to ASA only by [name].  He was noted to have an ACE induced cough, and therefore was changed to ARB. Now on olmesartan 20 mg daily.   Other history includes HL, chronic neuropathy, and hypertension.He has intermittent episodes of chest pain. Most recent NM study in June of 2019 revealed normal perfusion without evidence of scar or ischemia.   He has complaints today of fatigue with exertion. He states he usually is active with errands and exercise. However, when he went to the grocery store, did walking to other stores etc, he became extremely fatigued and when he got home he had to lay down and rest. He recovered after doing so and felt better the next day. The next time he was busy walking and running errands, the same thing occurred.   He has not worked out by  walking on his treadmill because he is easily tired. He denies chest pain, some dizziness is noted, but no significant shortness of breath, but has noticed a significant change in his stamina.   He states he noticed these changes when he was switched from losartan to olmesartan. He would like to go back to losartan.   Past Medical History:  Diagnosis Date   Atrial flutter ([place]) 02/02/2017   CAD, multiple vessel    Diabetes mellitus ([place])    Dyslipidemia    Hypertension     Past Surgical History:  Procedure Laterality Date   A-FLUTTER ABLATION N/A 02/05/2017   Procedure: A-Flutter Ablation;  Surgeon: Thompson Grayer, MD;  Location: [facility];  Service: Cardiovascular;  Laterality: N/A;   CARDIAC CATHETERIZATION  12/13/2011   No intervention - recommend medical therapy with increased medical trial   CARDIOVASCULAR STRESS TEST  10/26/2010   No scintigraphic evidence of inducible myocardial ischemia. No ECG changes. EKG negative for ischemia.   CORONARY ARTERY BYPASS GRAFT  1997   LIMA-LAD, vein graft to diagonal, vein graft to obtuse marginal, and vein graft to RCA   LEFT HEART CATHETERIZATION WITH CORONARY/GRAFT ANGIOGRAM N/A 12/13/2011   Procedure: LEFT HEART CATHETERIZATION WITH Beatrix Fetters;  Surgeon: Troy Sine, MD;  Location: [facility] CATH LAB;  Service: Cardiovascular;  Laterality: N/A;   LOWER EXTREMITY ARTERIAL DOPPLER  01/30/2000   Normal arterial study   TEE WITHOUT CARDIOVERSION N/A 02/04/2017   Procedure: TRANSESOPHAGEAL ECHOCARDIOGRAM (TEE);  Surgeon: Dorothy Spark, MD;  Location: [facility] ENDOSCOPY;  Service: Cardiovascular;  Laterality: N/A;   TRANSTHORACIC ECHOCARDIOGRAM  12/12/2011   EF >55%, LA severely dilated.     Current Outpatient Medications  Medication Sig Dispense Refill   Ascorbic Acid (VITAMIN C PO) Take 1 tablet by mouth daily.     aspirin EC 81 MG tablet Take 1 tablet (81 mg total) by mouth daily. 90 tablet 3   carvedilol (COREG)  6.25 MG tablet TAKE 1 TABLET(6.25 MG) BY MOUTH TWICE DAILY WITH A MEAL 180 tablet 3   celecoxib (CELEBREX) 100 MG capsule Take 100 mg by mouth daily.     Cholecalciferol (VITAMIN D3) 5000 units CAPS Take 5,000-10,000 Units by mouth See admin instructions. Take 5,000 units by mouth once a day and rotate with 10,000 units every other day     clopidogrel (PLAVIX) 75 MG tablet Take 1 tablet (75 mg total) by mouth daily. 90 tablet 3   Coenzyme Q10 (CO Q-10 PO) Take 1 capsule by mouth daily.     Ferrous Gluconate-C-Folic Acid (IRON-C PO) Take 1 capsule by mouth 2 (two) times a week.     isosorbide mononitrate (IMDUR) 30 MG 24 hr tablet Take 1 tablet (30 mg total) by mouth daily. 90 tablet 3   L-Methylfolate-B6-B12 (METANX PO) Take 1 tablet by mouth daily.      losartan (COZAAR) 25 MG tablet Take 1 tablet (25 mg total) by mouth daily. 30 tablet 12   niacin (NIASPAN) 1000 MG CR tablet TAKE 1 TABLET(1000 MG) BY MOUTH AT BEDTIME 90 tablet 1   nitroGLYCERIN (NITROLINGUAL) 0.4 MG/SPRAY spray Place 1 spray under the tongue every 5 (five) minutes x 3 doses as needed for chest pain. 12 g 11   NUCYNTA ER 200 MG TB12 Take 200 mg by mouth 2 (two) times daily.  0   omega-3 acid ethyl esters (LOVAZA) 1 g capsule Take 2 capsules (2 g total) by mouth daily. 180 capsule 3   oxyCODONE-acetaminophen (PERCOCET/ROXICET) 5-325 MG tablet Take 1 tablet by mouth daily as needed for moderate pain.      pioglitazone (ACTOS) 45 MG tablet Take 45 mg by mouth daily.     pregabalin (LYRICA) 200 MG capsule Take 200 mg by mouth 3 (three) times daily.      Quercetin 250 MG TABS Take 500 mg by mouth 2 (two) times daily.     ranolazine (RANEXA) 1000 MG SR tablet TAKE 1 TABLET BY MOUTH TWICE DAILY. GENERIC EQUIVALENT FOR RANEXA. 180 tablet 1   rosuvastatin (CRESTOR) 40 MG tablet Take 1 tablet (40 mg total) by mouth daily. (Patient taking differently: Take 40 mg by mouth at bedtime. ) 90 tablet 3   saxagliptin HCl  (ONGLYZA) 5 MG TABS tablet Take 5 mg by mouth daily.     UNABLE TO FIND Nuclezyme-Forte capsules: Take 1 capsule by mouth once a day     UNABLE TO FIND Liquid Turmeric: Take 5 ml's by mouth once a day     VITAMIN A PO Take 1 capsule by mouth daily.     vitamin E (VITAMIN E) 400 UNIT capsule Take 400 Units by mouth daily.     No current facility-administered medications for this visit.     Allergies:   Nortriptyline and Cymbalta [duloxetine hcl]  Social History:  The patient  reports that he has never smoked. He has never used smokeless tobacco. He reports that he drinks alcohol. He reports that he has current or past drug history.   Family History:  The patient's family history includes Diabetes in his father; Heart disease in his father; Hyperlipidemia in his sister.    ROS: All other systems are reviewed and negative. Unless otherwise mentioned in H&P    PHYSICAL EXAM: VS:  BP 130/72 (BP Location: Left Arm, Patient Position: Sitting, Cuff Size: Normal)    Pulse 60    Ht 5\' 10"  (1.778 m)    Wt 218 lb (98.9 kg)    BMI 31.28 kg/m  , BMI Body mass index is 31.28 kg/m. GEN: Well nourished, well developed, in no acute distress HEENT: normal Neck: no JVD, carotid bruits, or masses Cardiac: RRR; no murmurs, rubs, or gallops,no edema  Respiratory:  Clear to auscultation bilaterally, normal work of breathing GI: soft, nontender, nondistended, + BS MS: no deformity or atrophy Skin: warm and dry, no rash Neuro:  Strength and sensation are intact Psych: euthymic mood, full affect   EKG:  NSR with 1st degree AV block , non-specific T-wave abnormality. Rate of 60 bpm. (Unchanged from prior EKG).   Recent Labs: 06/11/2018: ALT 20; BUN 12; Creatinine, Ser 0.85; Hemoglobin 13.5; Platelets 142; Potassium 3.9; Sodium 137; TSH 1.870    Lipid Panel    Component Value Date/Time   CHOL 125 06/11/2018 0819   CHOL 127 11/09/2015 0854   CHOL 107 02/12/2014 0801   TRIG 53 06/11/2018 0819     TRIG 57 11/09/2015 0854   TRIG 37 02/12/2014 0801   HDL 55 06/11/2018 0819   HDL 55 11/09/2015 0854   HDL 57 02/12/2014 0801   CHOLHDL 2.3 06/11/2018 0819   CHOLHDL 2.3 11/09/2015 0854   CHOLHDL 2.0 09/29/2014 0853   VLDL 10 09/29/2014 0853   LDLCALC 59 06/11/2018 0819   LDLCALC 61 11/09/2015 0854   LDLCALC 43 02/12/2014 0801      Wt Readings from Last 3 Encounters:  09/03/18 218 lb (98.9 kg)  06/18/18 209 lb (94.8 kg)  03/13/18 213 lb (96.6 kg)      Other studies Reviewed: Study Highlights 03/13/2018   The left ventricular ejection fraction is normal (55-65%).  Nuclear stress EF: 55%.  There was no ST segment deviation noted during stress.  The study is normal.  This is a low risk study.   Echocardiogram 02/04/2017 Left ventricle: Systolic function was normal. Wall motion was   normal; there were no regional wall motion abnormalities. - Aortic valve: There was mild regurgitation. - Ascending aorta: The ascending aorta was normal in size. - Left atrium: The atrium was dilated. No evidence of thrombus in   the atrial cavity or appendage. No evidence of thrombus in the   atrial cavity or appendage. - Right ventricle: Systolic function was normal. - Right atrium: No evidence of thrombus in the atrial cavity or   appendage. - Atrial septum: No defect or patent foramen ovale was identified. - Pericardium, extracardiac: There was no pericardial effusion.  Impressions:  - The atrium was mildly dilated. No thrombus in the left atrium or   left atrial appendage. Normal emptying and filling velocities.  ASSESSMENT AND PLAN:  1. CAD: He is not experiencing any chest pain or significant dyspnea. Just worsening fatigue. I will not repeat any ischemic testing as his last NM study in 03/13/2018 was a low risk  study and EKG is unchanged.Marland Kitchen He will continue on current regimen with DAPT, ASA, nitrates and statin therapy.   2. Hx of atrial flutter: S/P ablation. I will place  a cardiac monitor to ascertain if he is experiencing recurrent atrial fib, or other arrhythmia leading to his fatigue when he is exerting himself. He could not tell that he was in atrial flutter in the past, as this was found incidentally on examination by PCP noting an elevated HR.    3. Hypertension:  He requests to go back on losartan 25 mg daily as he was on in the past, because he felt better on this medication. I will re-order this, and hope that it is available again after prior recall.   Current medicines are reviewed at length with the patient today.    Labs/ tests ordered today include: None   Phill Myron. West Pugh, ANP, AACC   09/03/2018 3:35 PM    [place] [address] Office [phone] Fax [phone]

## 2018-09-10 ENCOUNTER — Ambulatory Visit (INDEPENDENT_AMBULATORY_CARE_PROVIDER_SITE_OTHER): Payer: Medicare Other

## 2018-09-10 DIAGNOSIS — R5383 Other fatigue: Secondary | ICD-10-CM

## 2018-09-10 DIAGNOSIS — I4892 Unspecified atrial flutter: Secondary | ICD-10-CM | POA: Diagnosis not present

## 2018-09-22 ENCOUNTER — Telehealth: Payer: Self-pay | Admitting: *Deleted

## 2018-09-22 NOTE — Telephone Encounter (Signed)
RECEIVED  STRIPS FROM  PREVENTICE  SERVICE - SHOW A 7 BEAT RUN V-TACH ON 09/21/18 @ 4:59 CENTRAL TIME (5:59 EASTERN).  PATIENT STATES  HE DID NOT HAVE ANY SYMPTOMS AT THAT TIME, BUT  @8  AM  PRESSED RECORDING - PER PATIENT FELT FATIGUE.   REVIEWED  STRIPS WITH K.LAWRENCE NP. NO CHANGES CONTINUE WITH MONITOR. PATIENT AWARE.  ONLY HAS 1 TO 2 DAYS LEFT. QUESTION ANSWERED.

## 2018-09-27 ENCOUNTER — Encounter (HOSPITAL_COMMUNITY): Payer: Self-pay | Admitting: *Deleted

## 2018-09-27 ENCOUNTER — Other Ambulatory Visit: Payer: Self-pay

## 2018-09-27 ENCOUNTER — Inpatient Hospital Stay (HOSPITAL_COMMUNITY)
Admission: EM | Admit: 2018-09-27 | Discharge: 2018-09-30 | DRG: 287 | Disposition: A | Discharge status: Home / Self Care | Payer: Medicare Other | Attending: Cardiology | Admitting: Cardiology

## 2018-09-27 ENCOUNTER — Emergency Department (HOSPITAL_COMMUNITY): Payer: Medicare Other

## 2018-09-27 ENCOUNTER — Telehealth: Payer: Self-pay | Admitting: Cardiology

## 2018-09-27 DIAGNOSIS — Z8679 Personal history of other diseases of the circulatory system: Secondary | ICD-10-CM | POA: Diagnosis not present

## 2018-09-27 DIAGNOSIS — I25110 Atherosclerotic heart disease of native coronary artery with unstable angina pectoris: Principal | ICD-10-CM | POA: Diagnosis present

## 2018-09-27 DIAGNOSIS — I200 Unstable angina: Secondary | ICD-10-CM | POA: Diagnosis present

## 2018-09-27 DIAGNOSIS — Z8249 Family history of ischemic heart disease and other diseases of the circulatory system: Secondary | ICD-10-CM

## 2018-09-27 DIAGNOSIS — Z888 Allergy status to other drugs, medicaments and biological substances status: Secondary | ICD-10-CM | POA: Diagnosis not present

## 2018-09-27 DIAGNOSIS — R5383 Other fatigue: Secondary | ICD-10-CM | POA: Diagnosis not present

## 2018-09-27 DIAGNOSIS — E1142 Type 2 diabetes mellitus with diabetic polyneuropathy: Secondary | ICD-10-CM | POA: Diagnosis present

## 2018-09-27 DIAGNOSIS — I10 Essential (primary) hypertension: Secondary | ICD-10-CM | POA: Diagnosis present

## 2018-09-27 DIAGNOSIS — Z833 Family history of diabetes mellitus: Secondary | ICD-10-CM

## 2018-09-27 DIAGNOSIS — Z950 Presence of cardiac pacemaker: Secondary | ICD-10-CM

## 2018-09-27 DIAGNOSIS — Z955 Presence of coronary angioplasty implant and graft: Secondary | ICD-10-CM | POA: Diagnosis not present

## 2018-09-27 DIAGNOSIS — R079 Chest pain, unspecified: Secondary | ICD-10-CM

## 2018-09-27 DIAGNOSIS — E785 Hyperlipidemia, unspecified: Secondary | ICD-10-CM | POA: Diagnosis present

## 2018-09-27 DIAGNOSIS — I25710 Atherosclerosis of autologous vein coronary artery bypass graft(s) with unstable angina pectoris: Secondary | ICD-10-CM | POA: Diagnosis not present

## 2018-09-27 DIAGNOSIS — R0602 Shortness of breath: Secondary | ICD-10-CM | POA: Diagnosis not present

## 2018-09-27 DIAGNOSIS — Z8349 Family history of other endocrine, nutritional and metabolic diseases: Secondary | ICD-10-CM

## 2018-09-27 LAB — CBC
HCT: 41 % (ref 39.0–52.0)
Hemoglobin: 13.4 g/dL (ref 13.0–17.0)
MCH: 31.1 pg (ref 26.0–34.0)
MCHC: 32.7 g/dL (ref 30.0–36.0)
MCV: 95.1 fL (ref 80.0–100.0)
NRBC: 0 % (ref 0.0–0.2)
PLATELETS: 158 10*3/uL (ref 150–400)
RBC: 4.31 MIL/uL (ref 4.22–5.81)
RDW: 15.8 % — AB (ref 11.5–15.5)
WBC: 5.5 10*3/uL (ref 4.0–10.5)

## 2018-09-27 LAB — BASIC METABOLIC PANEL
Anion gap: 6 (ref 5–15)
BUN: 8 mg/dL (ref 8–23)
CO2: 26 mmol/L (ref 22–32)
CREATININE: 0.99 mg/dL (ref 0.61–1.24)
Calcium: 9.1 mg/dL (ref 8.9–10.3)
Chloride: 105 mmol/L (ref 98–111)
GFR calc Af Amer: >60 mL/min (ref >60)
GFR calc non Af Amer: >60 mL/min (ref >60)
Glucose, Bld: 106 mg/dL — ABNORMAL HIGH (ref 70–99)
Potassium: 3.9 mmol/L (ref 3.5–5.1)
Sodium: 137 mmol/L (ref 135–145)

## 2018-09-27 LAB — I-STAT TROPONIN, ED: Troponin i, poc: 0 ng/mL (ref 0.00–0.08)

## 2018-09-27 LAB — TROPONIN I: Troponin I: <0.03 ng/mL (ref <0.03)

## 2018-09-27 MED ORDER — HEPARIN BOLUS VIA INFUSION
4000.0000 [IU] | Freq: Once | INTRAVENOUS | Status: AC
  Administered 2018-09-27: 4000 [IU] via INTRAVENOUS
  Filled 2018-09-27: qty 4000

## 2018-09-27 MED ORDER — ONDANSETRON HCL 4 MG/2ML IJ SOLN: 4.0000 mg | Freq: Four times a day (QID) | INTRAMUSCULAR | Status: DC | PRN

## 2018-09-27 MED ORDER — ACETAMINOPHEN 325 MG PO TABS: 650.0000 mg | ORAL_TABLET | ORAL | Status: DC | PRN

## 2018-09-27 MED ORDER — ISOSORBIDE MONONITRATE ER 30 MG PO TB24
30.0000 mg | ORAL_TABLET | Freq: Every day | ORAL | Status: DC
  Administered 2018-09-28 – 2018-09-29 (×2): 30 mg via ORAL
  Filled 2018-09-27 (×2): qty 1

## 2018-09-27 MED ORDER — ROSUVASTATIN CALCIUM 20 MG PO TABS
40.0000 mg | ORAL_TABLET | Freq: Every day | ORAL | Status: DC
  Administered 2018-09-27 – 2018-09-29 (×3): 40 mg via ORAL
  Filled 2018-09-27 (×3): qty 2

## 2018-09-27 MED ORDER — TAPENTADOL HCL 50 MG PO TABS
100.0000 mg | ORAL_TABLET | Freq: Three times a day (TID) | ORAL | Status: DC
  Administered 2018-09-28 – 2018-09-29 (×2): 100 mg via ORAL
  Filled 2018-09-27 (×4): qty 2

## 2018-09-27 MED ORDER — OXYCODONE-ACETAMINOPHEN 5-325 MG PO TABS: 1.0000 | ORAL_TABLET | Freq: Every day | ORAL | Status: DC | PRN

## 2018-09-27 MED ORDER — ASPIRIN EC 81 MG PO TBEC
81.0000 mg | DELAYED_RELEASE_TABLET | Freq: Every day | ORAL | Status: DC
  Administered 2018-09-28 – 2018-09-30 (×3): 81 mg via ORAL
  Filled 2018-09-27 (×3): qty 1

## 2018-09-27 MED ORDER — PREGABALIN 100 MG PO CAPS
200.0000 mg | ORAL_CAPSULE | Freq: Three times a day (TID) | ORAL | Status: DC
  Administered 2018-09-27 – 2018-09-30 (×8): 200 mg via ORAL
  Filled 2018-09-27 (×8): qty 2

## 2018-09-27 MED ORDER — OMEGA-3-ACID ETHYL ESTERS 1 G PO CAPS
2.0000 | ORAL_CAPSULE | Freq: Every day | ORAL | Status: DC
  Administered 2018-09-28 – 2018-09-30 (×2): 2 g via ORAL
  Filled 2018-09-27 (×2): qty 2

## 2018-09-27 MED ORDER — CARVEDILOL 6.25 MG PO TABS
6.2500 mg | ORAL_TABLET | Freq: Two times a day (BID) | ORAL | Status: DC
  Administered 2018-09-27 – 2018-09-30 (×5): 6.25 mg via ORAL
  Filled 2018-09-27 (×7): qty 1

## 2018-09-27 MED ORDER — HEPARIN (PORCINE) 25000 UT/250ML-% IV SOLN
1200.0000 [IU]/h | INTRAVENOUS | Status: DC
  Administered 2018-09-27 – 2018-09-28 (×2): 1200 [IU]/h via INTRAVENOUS
  Filled 2018-09-27 (×3): qty 250

## 2018-09-27 MED ORDER — LOSARTAN POTASSIUM 25 MG PO TABS
25.0000 mg | ORAL_TABLET | Freq: Every day | ORAL | Status: DC
  Administered 2018-09-28 – 2018-09-30 (×3): 25 mg via ORAL
  Filled 2018-09-27 (×3): qty 1

## 2018-09-27 MED ORDER — CLOPIDOGREL BISULFATE 75 MG PO TABS
75.0000 mg | ORAL_TABLET | Freq: Every day | ORAL | Status: DC
  Administered 2018-09-28 – 2018-09-30 (×3): 75 mg via ORAL
  Filled 2018-09-27 (×3): qty 1

## 2018-09-27 MED ORDER — RANOLAZINE ER 500 MG PO TB12
1000.0000 mg | ORAL_TABLET | Freq: Two times a day (BID) | ORAL | Status: DC
  Administered 2018-09-27 – 2018-09-30 (×5): 1000 mg via ORAL
  Filled 2018-09-27 (×5): qty 2

## 2018-09-27 NOTE — ED Notes (Signed)
Cardiologist bedside

## 2018-09-27 NOTE — ED Triage Notes (Signed)
Pt reports Pressure (compression ) CP last night with Heart racing ,and loss of energy and Pt could feal heart beat. CP started at 2100 and resolved. Pt call cards and was instructed to come to ED. Pt took his Plavix 75mg  this AM

## 2018-09-27 NOTE — Telephone Encounter (Signed)
Patient called answering service with complaints of more frequent chest pressure.  He has been having chest pressure episodes over the last month but they are becoming more frequent.  Last night he had an episode of chest pressure with severe weakness and loss of all energy.  He has a history of remote CABG.  I am concerned about graft failure.  I advised that he should come to the hospital for further evaluation.  He may need a cardiac catheterization on Monday to assess his coronary arteries and grafts.  He agrees and will come to the hospital.

## 2018-09-27 NOTE — Progress Notes (Signed)
ANTICOAGULATION CONSULT NOTE - Initial Consult  Pharmacy Consult for heparin Indication: chest pain/ACS  Allergies  Allergen Reactions  . Nortriptyline Other (See Comments)    Groggy at low dose  . Cymbalta [Duloxetine Hcl] Diarrhea    Patient Measurements: Height: 5\' 10"  (177.8 cm) Weight: 208 lb (94.3 kg) IBW/kg (Calculated) : 73 Heparin Dosing Weight: 94 kg   Vital Signs: Temp: 97.5 F (36.4 C) (01/04 1156) Temp Source: Oral (01/04 1156) BP: 105/90 (01/04 1415) Pulse Rate: 56 (01/04 1415)  Labs: Recent Labs    09/27/18 1143  HGB 13.4  HCT 41.0  PLT 158  CREATININE 0.99    Estimated Creatinine Clearance: 78.9 mL/min (by C-G formula based on SCr of 0.99 mg/dL).   Medical History: Past Medical History:  Diagnosis Date  . Atrial flutter ([place]) 02/02/2017  . CAD, multiple vessel   . Diabetes mellitus ([place])   . Dyslipidemia   . Hypertension     Medications:  (Not in a hospital admission)   Assessment: 79 YOM with h/o of CAD here with increased chest pressure and concern for unstable angina. Pharmacy consulted to start IV heparin. H/H and Plt wnl. He is not on any anticoagulants at home   Goal of Therapy:  Heparin level 0.3-0.7 units/ml Monitor platelets by anticoagulation protocol: Yes   Plan:  -Heparin 4000 units IV bolus, then start heparin infusion at 1200 units/hr -F/u 8 hr HL -Monitor daily HL, CBC and s/s of bleeding   Albertina Parr, PharmD., BCPS Clinical Pharmacist Clinical phone for 09/27/18 until 8:30pm: [phone] If after 8:30pm, please refer to [facility] for unit-specific pharmacist

## 2018-09-27 NOTE — H&P (Signed)
Cardiology Admission History and Physical:   Patient ID: ECHO TOPP MRN: [id number]; DOB: [birth year]   Admission date: 09/27/2018  Primary Care Provider: Darrow Bussing, MD Primary Cardiologist: Dr. Tresa Endo  Chief Complaint:  Fatigue and chest tightness  Patient Profile:   Ruben [name] is a 73 y.o. male with PMH CAD (CABG 1997, LIMA-LAD, SVG-Diag, SVG-OM, SVG-RCA) s/p stent to SVG-Lcx in 2004, then occluded SVG-LCx seen 2009 so had stent to ramus, also had stent to SVG-RCA at that time. Cath 2013 showed patent stents. History of atrial flutter s/p ablation 02/2017.  History of Present Illness:   [name] presents with worsening fatigue and increasing chest pressure. Last night, he had a severe episode of chest pressure at rest that made him feel completely weak. He was also recently called about a brief episode of NSVT seen on heart monitor.  He is seen resting in bed and currently feels ok. No syncope. Has had some mild LE edema, not significant to him. No real shortness of breath except with the chest pressure. No PND or orthopnea.   On presentation to the ER, no acute ischemic changes on ECG. Labs thus far unremarkable, including negative troponin. Chest x ray nonacute.  Reviewed medications with him. He does not recall being told about celebrex being dangerous. He only takes 1/2 plavix daily due to easy bruising but did take full dose this AM.  Past Medical History:  Diagnosis Date  . Atrial flutter (HCC) 02/02/2017  . CAD, multiple vessel   . Diabetes mellitus (HCC)   . Dyslipidemia   . Hypertension     Past Surgical History:  Procedure Laterality Date  . A-FLUTTER ABLATION N/A 02/05/2017   Procedure: A-Flutter Ablation;  Surgeon: Hillis Range, MD;  Location: [facility] INVASIVE CV LAB;  Service: Cardiovascular;  Laterality: N/A;  . CARDIAC CATHETERIZATION  12/13/2011   No intervention - recommend medical therapy with increased medical trial  . CARDIOVASCULAR STRESS TEST   10/26/2010   No scintigraphic evidence of inducible myocardial ischemia. No ECG changes. EKG negative for ischemia.  . CORONARY ARTERY BYPASS GRAFT  1997   LIMA-LAD, vein graft to diagonal, vein graft to obtuse marginal, and vein graft to RCA  . LEFT HEART CATHETERIZATION WITH CORONARY/GRAFT ANGIOGRAM N/A 12/13/2011   Procedure: LEFT HEART CATHETERIZATION WITH Isabel Caprice;  Surgeon: Lennette Bihari, MD;  Location: [facility] CATH LAB;  Service: Cardiovascular;  Laterality: N/A;  . LOWER EXTREMITY ARTERIAL DOPPLER  01/30/2000   Normal arterial study  . TEE WITHOUT CARDIOVERSION N/A 02/04/2017   Procedure: TRANSESOPHAGEAL ECHOCARDIOGRAM (TEE);  Surgeon: Lars Masson, MD;  Location: [facility] ENDOSCOPY;  Service: Cardiovascular;  Laterality: N/A;  . TRANSTHORACIC ECHOCARDIOGRAM  12/12/2011   EF >55%, LA severely dilated.     Medications Prior to Admission: Prior to Admission medications   Medication Sig Start Date End Date Taking? Authorizing Provider  Ascorbic Acid (VITAMIN C PO) Take 1 tablet by mouth daily.    [provider]  aspirin EC 81 MG tablet Take 1 tablet (81 mg total) by mouth daily. 03/07/17   Allred, Fayrene Fearing, MD  carvedilol (COREG) 6.25 MG tablet TAKE 1 TABLET(6.25 MG) BY MOUTH TWICE DAILY WITH A MEAL 09/12/17   Lennette Bihari, MD  celecoxib (CELEBREX) 100 MG capsule Take 100 mg by mouth daily. 10/10/17   [provider]  Cholecalciferol (VITAMIN D3) 5000 units CAPS Take 5,000-10,000 Units by mouth See admin instructions. Take 5,000 units by mouth once a day and rotate with  10,000 units every other day    [provider]  clopidogrel (PLAVIX) 75 MG tablet Take 1 tablet (75 mg total) by mouth daily. 06/26/17   Lennette Bihari, MD  Coenzyme Q10 (CO Q-10 PO) Take 1 capsule by mouth daily.    [provider]  Ferrous Gluconate-C-Folic Acid (IRON-C PO) Take 1 capsule by mouth 2 (two) times a week.    [provider]  isosorbide mononitrate (IMDUR)  30 MG 24 hr tablet Take 1 tablet (30 mg total) by mouth daily. 06/26/17   Lennette Bihari, MD  L-Methylfolate-B6-B12 (METANX PO) Take 1 tablet by mouth daily.     [provider]  losartan (COZAAR) 25 MG tablet Take 1 tablet (25 mg total) by mouth daily. 09/03/18   Jodelle Gross, NP  niacin (NIASPAN) 1000 MG CR tablet TAKE 1 TABLET(1000 MG) BY MOUTH AT BEDTIME 07/21/18   Lennette Bihari, MD  nitroGLYCERIN (NITROLINGUAL) 0.4 MG/SPRAY spray Place 1 spray under the tongue every 5 (five) minutes x 3 doses as needed for chest pain. 03/07/18   Lennette Bihari, MD  NUCYNTA ER 200 MG TB12 Take 200 mg by mouth 2 (two) times daily. 12/05/17   [provider]  omega-3 acid ethyl esters (LOVAZA) 1 g capsule Take 2 capsules (2 g total) by mouth daily. 03/07/18   Lennette Bihari, MD  oxyCODONE-acetaminophen (PERCOCET/ROXICET) 5-325 MG tablet Take 1 tablet by mouth daily as needed for moderate pain.     [provider]  pioglitazone (ACTOS) 45 MG tablet Take 45 mg by mouth daily.    [provider]  pregabalin (LYRICA) 200 MG capsule Take 200 mg by mouth 3 (three) times daily.     [provider]  Quercetin 250 MG TABS Take 500 mg by mouth 2 (two) times daily.    [provider]  ranolazine (RANEXA) 1000 MG SR tablet TAKE 1 TABLET BY MOUTH TWICE DAILY. GENERIC EQUIVALENT FOR RANEXA. 08/13/18   Lennette Bihari, MD  rosuvastatin (CRESTOR) 40 MG tablet Take 1 tablet (40 mg total) by mouth daily. Patient taking differently: Take 40 mg by mouth at bedtime.  06/26/17   Lennette Bihari, MD  saxagliptin HCl (ONGLYZA) 5 MG TABS tablet Take 5 mg by mouth daily.    [provider]  UNABLE TO FIND Nuclezyme-ForteT capsules: Take 1 capsule by mouth once a day    [provider]  UNABLE TO FIND Liquid Turmeric: Take 5 ml's by mouth once a day    [provider]  VITAMIN A PO Take 1 capsule by mouth daily.    [provider]  vitamin E  (VITAMIN E) 400 UNIT capsule Take 400 Units by mouth daily.    [provider]     Allergies:    Allergies  Allergen Reactions  . Nortriptyline Other (See Comments)    Groggy at low dose  . Cymbalta [Duloxetine Hcl] Diarrhea    Social History:   Social History   Socioeconomic History  . Marital status: Married    Spouse name: Not on file  . Number of children: Not on file  . Years of education: Not on file  . Highest education level: Not on file  Occupational History  . Not on file  Social Needs  . Financial resource strain: Not on file  . Food insecurity:    Worry: Not on file    Inability: Not on file  . Transportation needs:  Medical: Not on file    Non-medical: Not on file  Tobacco Use  . Smoking status: Never Smoker  . Smokeless tobacco: Never Used  Substance and Sexual Activity  . Alcohol use: Yes  . Drug use: Yes  . Sexual activity: Not on file  Lifestyle  . Physical activity:    Days per week: Not on file    Minutes per session: Not on file  . Stress: Not on file  Relationships  . Social connections:    Talks on phone: Not on file    Gets together: Not on file    Attends religious service: Not on file    Active member of club or organization: Not on file    Attends meetings of clubs or organizations: Not on file    Relationship status: Not on file  . Intimate partner violence:    Fear of current or ex partner: Not on file    Emotionally abused: Not on file    Physically abused: Not on file    Forced sexual activity: Not on file  Other Topics Concern  . Not on file  Social History Narrative  . Not on file    Family History:   The patient's family history includes Diabetes in his father; Heart disease in his father; Hyperlipidemia in his sister.    ROS:  Please see the history of present illness.  Review of Systems  Constitutional: Positive for malaise/fatigue. Negative for chills and fever.  HENT: Negative for nosebleeds and sore  throat.   Eyes: Negative for blurred vision and pain.  Respiratory: Positive for shortness of breath. Negative for cough and hemoptysis.   Cardiovascular: Positive for chest pain and leg swelling. Negative for palpitations, orthopnea, claudication and PND.  Gastrointestinal: Negative for abdominal pain, blood in stool and melena.  Genitourinary: Negative for dysuria and hematuria.  Musculoskeletal: Positive for joint pain. Negative for falls and myalgias.  Neurological: Negative for focal weakness and loss of consciousness.  Endo/Heme/Allergies: Bruises/bleeds easily.   All other ROS reviewed and negative.     Physical Exam/Data:   Vitals:   09/27/18 1323 09/27/18 1345 09/27/18 1400 09/27/18 1415  BP: 120/71 (!) 154/65 123/70 105/90  Pulse: (!) 55 (!) 57 (!) 56 (!) 56  Resp: 16 (!) 21 16 14   Temp:      TempSrc:      SpO2: 98% 99% 97% 96%  Weight:      Height:       No intake or output data in the 24 hours ending 09/27/18 1442 Filed Weights   09/27/18 1133  Weight: 94.3 kg   Body mass index is 29.84 kg/m.  General:  Well nourished, well developed, in no acute distress HEENT: normal Lymph: no adenopathy Neck: no JVD Endocrine:  No thryomegaly Vascular: No carotid bruits; RA pulses 2+ bilaterally Cardiac:  normal S1, S2; RRR; no murmur Lungs:  clear to auscultation bilaterally, no wheezing, rhonchi or rales  Abd: soft, nontender, no hepatomegaly  Ext: trace LE bilateral edema Musculoskeletal:  No deformities, BUE and BLE strength normal and equal Skin: warm and dry  Neuro:  CNs 2-12 intact, no focal abnormalities noted Psych:  Normal affect    EKG:  The ECG that was done was personally reviewed and demonstrates sinus bradycardia, incomplete RBBB  Relevant CV Studies: Last lexiscan 03/13/18  The left ventricular ejection fraction is normal (55-65%).  Nuclear stress EF: 55%.  There was no ST segment deviation noted during stress.  The study  is normal.  This is a  low risk study.  Laboratory Data:  Chemistry Recent Labs  Lab 09/27/18 1143  NA 137  K 3.9  CL 105  CO2 26  GLUCOSE 106*  BUN 8  CREATININE 0.99  CALCIUM 9.1  GFRNONAA >60  GFRAA >60  ANIONGAP 6    No results for input(s): PROT, ALBUMIN, AST, ALT, ALKPHOS, BILITOT in the last 168 hours. Hematology Recent Labs  Lab 09/27/18 1143  WBC 5.5  RBC 4.31  HGB 13.4  HCT 41.0  MCV 95.1  MCH 31.1  MCHC 32.7  RDW 15.8*  PLT 158   Cardiac EnzymesNo results for input(s): TROPONINI in the last 168 hours.  Recent Labs  Lab 09/27/18 1147  TROPIPOC 0.00    BNPNo results for input(s): BNP, PROBNP in the last 168 hours.  DDimer No results for input(s): DDIMER in the last 168 hours.  Radiology/Studies:  Dg Chest 2 View  Result Date: 09/27/2018 CLINICAL DATA:  Chest pain EXAM: CHEST - 2 VIEW COMPARISON:  03/04/2018 FINDINGS: Cardiac enlargement. Status post median sternotomy with CABG procedure. No pleural effusion or edema identified. No airspace opacities. Dorsal spine stimulator noted. IMPRESSION: 1. No acute cardiopulmonary abnormalities. 2. Cardiac enlargement. Electronically Signed   By: Signa Kell M.D.   On: 09/27/2018 12:08    Assessment and Plan:   1. Unstable angina with history of CAD, prior CABG and stents: -gradual worsening of fatigue with unremarkable stress test about 6 mos ago. However, has been having progressive symptoms and then severe chest discomfort at rest yesterday -admit to cardiology for unstable angina -heparin drip, continue aspirin and clopidogrel -continue statin -will repeat echo -monitor on telemetry given recent NSVT on monitor -continue to trend troponin -discussed possible cath on Monday. He is amenable and would prefer to do this to get definitive information  Risks and benefits of cardiac catheterization have been discussed with the patient.  These include bleeding, infection, kidney damage, stroke, heart attack, death.  The patient  understands these risks and is willing to proceed.  Severity of Illness: The appropriate patient status for this patient is INPATIENT. Inpatient status is judged to be reasonable and necessary in order to provide the required intensity of service to ensure the patient's safety. The patient's presenting symptoms, physical exam findings, and initial radiographic and laboratory data in the context of their chronic comorbidities is felt to place them at high risk for further clinical deterioration. Furthermore, it is not anticipated that the patient will be medically stable for discharge from the hospital within 2 midnights of admission. The following factors support the patient status of inpatient.   " The patient's presenting symptoms include chest discomfort at rest. " The chronic co-morbidities include known CAD s/p prior CABG and PCI.   * I certify that at the point of admission it is my clinical judgment that the patient will require inpatient hospital care spanning beyond 2 midnights from the point of admission due to high intensity of service, high risk for further deterioration and high frequency of surveillance required.*    For questions or updates, please contact [facility] Please consult [email or web address] for contact info under     Signed, Jodelle Red, MD  09/27/2018 2:42 PM

## 2018-09-27 NOTE — ED Provider Notes (Signed)
[facility] EMERGENCY DEPARTMENT Provider Note   CSN: [id number] Arrival date & time: 09/27/18  1123     History   Chief Complaint Chief Complaint  Patient presents with   Chest Pain    HPI Ruben [name] is a 73 y.o. male.  HPI Patient presents with chest pain and fatigue.  Has had on Tuesdays after doing more work at home and running errands.  States he gets very fatigued after and has to sit down.  Has been seen by his cardiologist and had a monitor on.  Reviewing records did have a 7 beat run of V. tach but it was not during any of the fatigue times.  Previous history of atrial flutter with ablation.  Also previous CABG.  However had a same episode of fatigue and mild chest tightness today while he was at rest.  Cardiology sent him in because he would likely need a heart catheterization tomorrow according to the note.  No swelling in his legs.  Has been riding his exercise bike less overall. Past Medical History:  Diagnosis Date   Atrial flutter ([place]) 02/02/2017   CAD, multiple vessel    Diabetes mellitus ([place])    Dyslipidemia    Hypertension     Patient Active Problem List   Diagnosis Date Noted   Atrial flutter with rapid ventricular response ([place]) 02/02/2017   CAD (coronary artery disease) 07/13/2013   Mixed hyperlipidemia 07/13/2013   DM2 (diabetes mellitus, type 2) ([place]) 07/13/2013   Peripheral neuropathy 07/13/2013    Past Surgical History:  Procedure Laterality Date   A-FLUTTER ABLATION N/A 02/05/2017   Procedure: A-Flutter Ablation;  Surgeon: Thompson Grayer, MD;  Location: [place] CV LAB;  Service: Cardiovascular;  Laterality: N/A;   CARDIAC CATHETERIZATION  12/13/2011   No intervention - recommend medical therapy with increased medical trial   CARDIOVASCULAR STRESS TEST  10/26/2010   No scintigraphic evidence of inducible myocardial ischemia. No ECG changes. EKG negative for ischemia.   CORONARY ARTERY BYPASS GRAFT  1997   LIMA-LAD, vein graft to diagonal, vein graft to obtuse marginal, and vein graft to RCA   LEFT HEART CATHETERIZATION WITH CORONARY/GRAFT ANGIOGRAM N/A 12/13/2011   Procedure: LEFT HEART CATHETERIZATION WITH Beatrix Fetters;  Surgeon: Troy Sine, MD;  Location: [facility] CATH LAB;  Service: Cardiovascular;  Laterality: N/A;   LOWER EXTREMITY ARTERIAL DOPPLER  01/30/2000   Normal arterial study   TEE WITHOUT CARDIOVERSION N/A 02/04/2017   Procedure: TRANSESOPHAGEAL ECHOCARDIOGRAM (TEE);  Surgeon: Dorothy Spark, MD;  Location: [facility] ENDOSCOPY;  Service: Cardiovascular;  Laterality: N/A;   TRANSTHORACIC ECHOCARDIOGRAM  12/12/2011   EF >55%, LA severely dilated.        Home Medications    Prior to Admission medications   Medication Sig Start Date End Date Taking? Authorizing Provider  Ascorbic Acid (VITAMIN C PO) Take 1 tablet by mouth daily.    [provider]  aspirin EC 81 MG tablet Take 1 tablet (81 mg total) by mouth daily. 03/07/17   Allred, Jeneen Rinks, MD  carvedilol (COREG) 6.25 MG tablet TAKE 1 TABLET(6.25 MG) BY MOUTH TWICE DAILY WITH A MEAL 09/12/17   Troy Sine, MD  celecoxib (CELEBREX) 100 MG capsule Take 100 mg by mouth daily. 10/10/17   [provider]  Cholecalciferol (VITAMIN D3) 5000 units CAPS Take 5,000-10,000 Units by mouth See admin instructions. Take 5,000 units by mouth once a day and rotate with 10,000 units every other day    [provider]  clopidogrel (PLAVIX) 75 MG tablet Take 1 tablet (75 mg total) by mouth daily. 06/26/17   Troy Sine, MD  Coenzyme Q10 (CO Q-10 PO) Take 1 capsule by mouth daily.    [provider]  Ferrous Gluconate-C-Folic Acid (IRON-C PO) Take 1 capsule by mouth 2 (two) times a week.    [provider]  isosorbide mononitrate (IMDUR) 30 MG 24 hr tablet Take 1 tablet (30 mg total) by mouth daily. 06/26/17   Troy Sine, MD  L-Methylfolate-B6-B12 (METANX PO) Take 1 tablet by mouth daily.      [provider]  losartan (COZAAR) 25 MG tablet Take 1 tablet (25 mg total) by mouth daily. 09/03/18   Lendon Colonel, NP  niacin (NIASPAN) 1000 MG CR tablet TAKE 1 TABLET(1000 MG) BY MOUTH AT BEDTIME 07/21/18   Troy Sine, MD  nitroGLYCERIN (NITROLINGUAL) 0.4 MG/SPRAY spray Place 1 spray under the tongue every 5 (five) minutes x 3 doses as needed for chest pain. 03/07/18   Troy Sine, MD  NUCYNTA ER 200 MG TB12 Take 200 mg by mouth 2 (two) times daily. 12/05/17   [provider]  omega-3 acid ethyl esters (LOVAZA) 1 g capsule Take 2 capsules (2 g total) by mouth daily. 03/07/18   Troy Sine, MD  oxyCODONE-acetaminophen (PERCOCET/ROXICET) 5-325 MG tablet Take 1 tablet by mouth daily as needed for moderate pain.     [provider]  pioglitazone (ACTOS) 45 MG tablet Take 45 mg by mouth daily.    [provider]  pregabalin (LYRICA) 200 MG capsule Take 200 mg by mouth 3 (three) times daily.     [provider]  Quercetin 250 MG TABS Take 500 mg by mouth 2 (two) times daily.    [provider]  ranolazine (RANEXA) 1000 MG SR tablet TAKE 1 TABLET BY MOUTH TWICE DAILY. GENERIC EQUIVALENT FOR RANEXA. 08/13/18   Troy Sine, MD  rosuvastatin (CRESTOR) 40 MG tablet Take 1 tablet (40 mg total) by mouth daily. Patient taking differently: Take 40 mg by mouth at bedtime.  06/26/17   Troy Sine, MD  saxagliptin HCl (ONGLYZA) 5 MG TABS tablet Take 5 mg by mouth daily.    [provider]  UNABLE TO FIND Nuclezyme-Forte capsules: Take 1 capsule by mouth once a day    [provider]  UNABLE TO FIND Liquid Turmeric: Take 5 ml's by mouth once a day    [provider]  VITAMIN A PO Take 1 capsule by mouth daily.    [provider]  vitamin E (VITAMIN E) 400 UNIT capsule Take 400 Units by mouth daily.    [provider]    Family History Family History  Problem Relation Age of Onset    Heart disease Father    Diabetes Father    Hyperlipidemia Sister     Social History Social History   Tobacco Use   Smoking status: Never Smoker   Smokeless tobacco: Never Used  Substance Use Topics   Alcohol use: Yes   Drug use: Yes     Allergies   Nortriptyline and Cymbalta [duloxetine hcl]   Review of Systems Review of Systems  Constitutional: Positive for fatigue. Negative for appetite change.  HENT: Negative for congestion.   Cardiovascular: Positive for chest pain. Negative for leg swelling.  Gastrointestinal: Negative for abdominal pain.  Endocrine: Negative for polyuria.  Genitourinary: Negative for frequency.  Musculoskeletal: Negative for back pain.  Skin: Negative for rash.  Neurological: Positive for weakness.  Psychiatric/Behavioral: Negative for behavioral problems.     Physical Exam Updated Vital Signs BP 120/71    Pulse (!) 55    Temp (!) 97.5 F (36.4 C) (Oral)    Resp 16    Ht 5\' 10"  (1.778 m)    Wt 94.3 kg    SpO2 98%    BMI 29.84 kg/m   Physical Exam HENT:     Head: Normocephalic.  Eyes:     Pupils: Pupils are equal, round, and reactive to light.  Neck:     Musculoskeletal: Neck supple.  Cardiovascular:     Rate and Rhythm: Regular rhythm.     Heart sounds: Normal heart sounds.  Pulmonary:     Breath sounds: Normal breath sounds. No wheezing or rhonchi.  Abdominal:     Tenderness: There is no abdominal tenderness.  Musculoskeletal:     Right lower leg: He exhibits no tenderness.     Left lower leg: He exhibits no tenderness.  Neurological:     Mental Status: He is alert.      ED Treatments / Results  Labs (all labs ordered are listed, but only abnormal results are displayed) Labs Reviewed  BASIC METABOLIC PANEL - Abnormal; Notable for the following components:      Result Value   Glucose, Bld 106 (*)    All other components within normal limits  CBC - Abnormal; Notable for the following components:   RDW 15.8 (*)     All other components within normal limits  I-STAT TROPONIN, ED    EKG EKG Interpretation  Date/Time:  Saturday September 27 2018 11:35:41 EST Ventricular Rate:  58 PR Interval:  258 QRS Duration: 94 QT Interval:  416 QTC Calculation: 408 R Axis:   -36 Text Interpretation:  Sinus bradycardia with 1st degree A-V block Left axis deviation Incomplete right bundle branch block Septal infarct , age undetermined Abnormal ECG Confirmed by Davonna Belling [phone]) on 09/27/2018 1:02:23 PM   Radiology Dg Chest 2 View  Result Date: 09/27/2018 CLINICAL DATA:  Chest pain EXAM: CHEST - 2 VIEW COMPARISON:  03/04/2018 FINDINGS: Cardiac enlargement. Status post median sternotomy with CABG procedure. No pleural effusion or edema identified. No airspace opacities. Dorsal spine stimulator noted. IMPRESSION: 1. No acute cardiopulmonary abnormalities. 2. Cardiac enlargement. Electronically Signed   By: Kerby Moors M.D.   On: 09/27/2018 12:08    Procedures Procedures (including critical care time)  Medications Ordered in ED Medications - No data to display   Initial Impression / Assessment and Plan / ED Course  I have reviewed the triage vital signs and the nursing notes.  Pertinent labs & imaging results that were available during my care of the patient were reviewed by me and considered in my medical decision making (see chart for details).     Patient with chest pain.  However mostly complaint is fatigue.  Cardiology sent in for admission and likely heart catheterization.  EKG reassuring.  Enzymes reassuring.  No other cause such as anemia found.  Admit to cardiology. Final Clinical Impressions(s) / ED Diagnoses   Final diagnoses:  Nonspecific chest pain  Fatigue, unspecified type    ED Discharge Orders    None       Davonna Belling, MD 09/27/18 [phone]

## 2018-09-28 LAB — CBC
HEMATOCRIT: 37.3 % — AB (ref 39.0–52.0)
HEMOGLOBIN: 12.6 g/dL — AB (ref 13.0–17.0)
MCH: 31.3 pg (ref 26.0–34.0)
MCHC: 33.8 g/dL (ref 30.0–36.0)
MCV: 92.8 fL (ref 80.0–100.0)
Platelets: 129 10*3/uL — ABNORMAL LOW (ref 150–400)
RBC: 4.02 MIL/uL — ABNORMAL LOW (ref 4.22–5.81)
RDW: 15.8 % — ABNORMAL HIGH (ref 11.5–15.5)
WBC: 4.3 10*3/uL (ref 4.0–10.5)
nRBC: 0 % (ref 0.0–0.2)

## 2018-09-28 LAB — TROPONIN I
Troponin I: <0.03 ng/mL (ref <0.03)
Troponin I: <0.03 ng/mL (ref <0.03)

## 2018-09-28 LAB — BASIC METABOLIC PANEL
Anion gap: 6 (ref 5–15)
BUN: 9 mg/dL (ref 8–23)
CO2: 26 mmol/L (ref 22–32)
Calcium: 8.8 mg/dL — ABNORMAL LOW (ref 8.9–10.3)
Chloride: 105 mmol/L (ref 98–111)
Creatinine, Ser: 0.95 mg/dL (ref 0.61–1.24)
GFR calc Af Amer: >60 mL/min (ref >60)
GFR calc non Af Amer: >60 mL/min (ref >60)
Glucose, Bld: 77 mg/dL (ref 70–99)
Potassium: 3.8 mmol/L (ref 3.5–5.1)
Sodium: 137 mmol/L (ref 135–145)

## 2018-09-28 LAB — GLUCOSE, CAPILLARY
GLUCOSE-CAPILLARY: 131 mg/dL — AB (ref 70–99)
GLUCOSE-CAPILLARY: 128 mg/dL — AB (ref 70–99)
Glucose-Capillary: 106 mg/dL — ABNORMAL HIGH (ref 70–99)
Glucose-Capillary: 144 mg/dL — ABNORMAL HIGH (ref 70–99)
Glucose-Capillary: 91 mg/dL (ref 70–99)

## 2018-09-28 LAB — HEPARIN LEVEL (UNFRACTIONATED)
HEPARIN UNFRACTIONATED: 0.43 [IU]/mL (ref 0.30–0.70)
Heparin Unfractionated: 0.45 IU/mL (ref 0.30–0.70)

## 2018-09-28 LAB — LIPID PANEL
CHOL/HDL RATIO: 2.2 ratio
Cholesterol: 110 mg/dL (ref 0–200)
HDL: 51 mg/dL (ref >40)
LDL Cholesterol: 52 mg/dL (ref 0–99)
Triglycerides: 37 mg/dL (ref <150)
VLDL: 7 mg/dL (ref 0–40)

## 2018-09-28 MED ORDER — SODIUM CHLORIDE 0.9% FLUSH
3.0000 mL | Freq: Two times a day (BID) | INTRAVENOUS | Status: DC
  Administered 2018-09-29: 3 mL via INTRAVENOUS

## 2018-09-28 MED ORDER — SODIUM CHLORIDE 0.9 % WEIGHT BASED INFUSION
3.0000 mL/kg/h | INTRAVENOUS | Status: AC
  Administered 2018-09-29: 3 mL/kg/h via INTRAVENOUS

## 2018-09-28 MED ORDER — SODIUM CHLORIDE 0.9% FLUSH: 3.0000 mL | INTRAVENOUS | Status: DC | PRN

## 2018-09-28 MED ORDER — SODIUM CHLORIDE 0.9 % IV SOLN: 250.0000 mL | INTRAVENOUS | Status: DC | PRN

## 2018-09-28 MED ORDER — SODIUM CHLORIDE 0.9 % WEIGHT BASED INFUSION
1.0000 mL/kg/h | INTRAVENOUS | Status: DC
  Administered 2018-09-29: 1 mL/kg/h via INTRAVENOUS

## 2018-09-28 MED ORDER — ASPIRIN 81 MG PO CHEW
81.0000 mg | CHEWABLE_TABLET | ORAL | Status: AC
  Administered 2018-09-29: 81 mg via ORAL
  Filled 2018-09-28: qty 1

## 2018-09-28 NOTE — Plan of Care (Signed)
  Problem: Education: Goal: Understanding of cardiac disease, CV risk reduction, and recovery process will improve Outcome: Progressing   

## 2018-09-28 NOTE — Progress Notes (Signed)
[facility] for heparin Indication: chest pain/ACS  Allergies  Allergen Reactions  . Nortriptyline Other (See Comments)    Groggy at low dose  . Cymbalta [Duloxetine Hcl] Diarrhea    Patient Measurements: Height: 5\' 10"  (177.8 cm) Weight: 208 lb (94.3 kg) IBW/kg (Calculated) : 73 Heparin Dosing Weight: 94 kg   Vital Signs: Temp: 98.1 F (36.7 C) (01/05 0055) Temp Source: Oral (01/05 0055) BP: 136/63 (01/05 0055) Pulse Rate: 54 (01/05 0055)  Labs: Recent Labs    09/27/18 1143 09/27/18 1726 09/28/18 0046  HGB 13.4  --  12.6*  HCT 41.0  --  37.3*  PLT 158  --  129*  HEPARINUNFRC  --   --  0.43  CREATININE 0.99  --   --   TROPONINI  --  <0.03  --     Estimated Creatinine Clearance: 78.9 mL/min (by C-G formula based on SCr of 0.99 mg/dL).  Assessment: 73 y.o. male with chest pain for heparin  Goal of Therapy:  Heparin level 0.3-0.7 units/ml Monitor platelets by anticoagulation protocol: Yes   Plan:  Continue Heparin at current rate   Phillis Knack, PharmD, BCPS

## 2018-09-28 NOTE — Progress Notes (Signed)
[place] for heparin Indication: chest pain/ACS  Allergies  Allergen Reactions  . Nortriptyline Other (See Comments)    Groggy at low dose  . Cymbalta [Duloxetine Hcl] Diarrhea    Patient Measurements: Height: 5\' 10"  (177.8 cm) Weight: 213 lb 8 oz (96.8 kg) IBW/kg (Calculated) : 73 Heparin Dosing Weight: 92.2 kg   Vital Signs: Temp: 97.8 F (36.6 C) (01/05 0923) Temp Source: Oral (01/05 0923) BP: 127/62 (01/05 0923) Pulse Rate: 51 (01/05 0923)  Labs: Recent Labs    09/27/18 1143 09/27/18 1726 09/28/18 0046 09/28/18 0732  HGB 13.4  --  12.6*  --   HCT 41.0  --  37.3*  --   PLT 158  --  129*  --   HEPARINUNFRC  --   --  0.43  --   CREATININE 0.99  --  0.95  --   TROPONINI  --  <0.03 <0.03 <0.03    Estimated Creatinine Clearance: 83.2 mL/min (by C-G formula based on SCr of 0.95 mg/dL).   Medical History: Past Medical History:  Diagnosis Date  . Atrial flutter ([place]) 02/02/2017  . CAD, multiple vessel   . Diabetes mellitus ([place])   . Dyslipidemia   . Hypertension     Medications:  Medications Prior to Admission  Medication Sig Dispense Refill Last Dose  . Ascorbic Acid (VITAMIN C PO) Take 1 tablet by mouth daily.   09/27/2018 at Unknown time  . aspirin EC 81 MG tablet Take 1 tablet (81 mg total) by mouth daily. 90 tablet 3 09/27/2018 at Unknown time  . carvedilol (COREG) 6.25 MG tablet TAKE 1 TABLET(6.25 MG) BY MOUTH TWICE DAILY WITH A MEAL (Patient taking differently: Take 6.25 mg by mouth 2 (two) times daily with a meal. ) 180 tablet 3 09/27/2018 at 08:00  . Cholecalciferol (VITAMIN D3) 5000 units CAPS Take 5,000-10,000 Units by mouth See admin instructions. Take 5,000 units by mouth once a day and rotate with 10,000 units every other day   09/26/2018 at Unknown time  . clopidogrel (PLAVIX) 75 MG tablet Take 1 tablet (75 mg total) by mouth daily. 90 tablet 3 09/27/2018 at 08:00  . Coenzyme Q10 (CO Q-10 PO) Take 1 capsule by mouth  daily.   09/27/2018 at Unknown time  . Ferrous Gluconate-C-Folic Acid (IRON-C PO) Take 1 capsule by mouth 2 (two) times a week.   09/24/2018  . isosorbide mononitrate (IMDUR) 30 MG 24 hr tablet Take 1 tablet (30 mg total) by mouth daily. 90 tablet 3 09/27/2018 at Unknown time  . L-Methylfolate-B6-B12 (METANX PO) Take 1 tablet by mouth daily.    09/27/2018 at Unknown time  . losartan (COZAAR) 25 MG tablet Take 1 tablet (25 mg total) by mouth daily. 30 tablet 12 09/27/2018 at Unknown time  . niacin (NIASPAN) 1000 MG CR tablet TAKE 1 TABLET(1000 MG) BY MOUTH AT BEDTIME (Patient taking differently: Take 1,000 mg by mouth at bedtime. ) 90 tablet 1 09/26/2018 at Unknown time  . nitroGLYCERIN (NITROLINGUAL) 0.4 MG/SPRAY spray Place 1 spray under the tongue every 5 (five) minutes x 3 doses as needed for chest pain. 12 g 11 unk at prn  . NUCYNTA ER 200 MG TB12 Take 200 mg by mouth 2 (two) times daily.  0 09/27/2018 at Unknown time  . omega-3 acid ethyl esters (LOVAZA) 1 g capsule Take 2 capsules (2 g total) by mouth daily. 180 capsule 3 09/26/2018 at Unknown time  . OVER THE COUNTER MEDICATION Take 5-6  drops by mouth See admin instructions. CBD oil for pain relief   09/26/2018 at Unknown time  . OVER THE COUNTER MEDICATION Take 5 mLs by mouth See admin instructions. Take 5 ml Tumeric by mouth as need for acid   09/26/2018 at Unknown time  . oxyCODONE-acetaminophen (PERCOCET/ROXICET) 5-325 MG tablet Take 1 tablet by mouth daily as needed for moderate pain.    unk at prn  . pioglitazone (ACTOS) 45 MG tablet Take 45 mg by mouth daily.   09/26/2018 at Unknown time  . pregabalin (LYRICA) 200 MG capsule Take 200 mg by mouth 3 (three) times daily.    09/27/2018 at Unknown time  . Quercetin 250 MG TABS Take 500 mg by mouth 2 (two) times daily.   09/27/2018 at Unknown time  . ranolazine (RANEXA) 1000 MG SR tablet TAKE 1 TABLET BY MOUTH TWICE DAILY. GENERIC EQUIVALENT FOR RANEXA. (Patient taking differently: Take 1,000 mg by mouth 2 (two) times  daily. ) 180 tablet 1 09/27/2018 at Unknown time  . rosuvastatin (CRESTOR) 40 MG tablet Take 1 tablet (40 mg total) by mouth daily. (Patient taking differently: Take 40 mg by mouth at bedtime. ) 90 tablet 3 09/26/2018 at Unknown time  . saxagliptin HCl (ONGLYZA) 5 MG TABS tablet Take 5 mg by mouth daily.   09/27/2018 at Unknown time  . vitamin E (VITAMIN E) 400 UNIT capsule Take 400 Units by mouth daily.   09/27/2018 at Unknown time    Assessment: 74 YOM with h/o of CAD here with increased chest pressure and concern for unstable angina. Pharmacy consulted to start IV heparin. H/H and Plt wnl. He is not on any anticoagulants at home   Heparin level therapeutic this morning, 0.45 units/mL on 1200 units/hr. CBC ok  Goal of Therapy:  Heparin level 0.3-0.7 units/ml Monitor platelets by anticoagulation protocol: Yes   Plan:  -Continue heparin infusion at 1200 units/hr -Monitor daily heparin level, CBC and s/s of bleeding   Harrietta Guardian, PharmD PGY1 Pharmacy Resident 09/28/2018    10:35 AM Please check AMION for all [place] numbers

## 2018-09-28 NOTE — Progress Notes (Signed)
Progress Note  Patient Name: Ruben [name] Date of Encounter: 09/28/2018  Primary Cardiologist: Dr. Claiborne Billings  Subjective   Slept poorly. Did have some chest pressure after standing up and shaving this AM, resolved spontaneously. Spent time discussing possible outcomes of cath.  Inpatient Medications    Scheduled Meds: . aspirin EC  81 mg Oral Daily  . carvedilol  6.25 mg Oral BID WC  . clopidogrel  75 mg Oral Daily  . isosorbide mononitrate  30 mg Oral Daily  . losartan  25 mg Oral Daily  . omega-3 acid ethyl esters  2 capsule Oral Daily  . pregabalin  200 mg Oral TID  . ranolazine  1,000 mg Oral BID  . rosuvastatin  40 mg Oral QHS  . tapentadol  100 mg Oral TID AC & HS   Continuous Infusions: . heparin 1,200 Units/hr (09/28/18 1051)   PRN Meds: acetaminophen, ondansetron (ZOFRAN) IV, oxyCODONE-acetaminophen   Vital Signs    Vitals:   09/27/18 2023 09/28/18 0055 09/28/18 0451 09/28/18 0923  BP: 128/71 136/63 125/67 127/62  Pulse: (!) 56 (!) 54 (!) 56 (!) 51  Resp: 18 18 18 18   Temp: 98 F (36.7 C) 98.1 F (36.7 C) (!) 97.5 F (36.4 C) 97.8 F (36.6 C)  TempSrc: Oral Oral Oral Oral  SpO2: 94% 95% 97%   Weight:   96.8 kg   Height:        Intake/Output Summary (Last 24 hours) at 09/28/2018 1129 Last data filed at 09/28/2018 0900 Gross per 24 hour  Intake 406.61 ml  Output 1900 ml  Net -1493.39 ml   Filed Weights   09/27/18 1133 09/28/18 0451  Weight: 94.3 kg 96.8 kg    Telemetry    NSR with artifact, no NSVT seen - Personally Reviewed  ECG    No new since admission - Personally Reviewed  Physical Exam   GEN: No acute distress.   Neck: No JVD Cardiac: RRR, no murmurs, rubs, or gallops.  Respiratory: Clear to auscultation bilaterally. GI: Soft, nontender, non-distended  MS: No edema; No deformity. Neuro:  Nonfocal  Psych: Normal affect   Labs    Chemistry Recent Labs  Lab 09/27/18 1143 09/28/18 0046  NA 137 137  K 3.9 3.8  CL 105 105    CO2 26 26  GLUCOSE 106* 77  BUN 8 9  CREATININE 0.99 0.95  CALCIUM 9.1 8.8*  GFRNONAA >60 >60  GFRAA >60 >60  ANIONGAP 6 6     Hematology Recent Labs  Lab 09/27/18 1143 09/28/18 0046  WBC 5.5 4.3  RBC 4.31 4.02*  HGB 13.4 12.6*  HCT 41.0 37.3*  MCV 95.1 92.8  MCH 31.1 31.3  MCHC 32.7 33.8  RDW 15.8* 15.8*  PLT 158 129*    Cardiac Enzymes Recent Labs  Lab 09/27/18 1726 09/28/18 0046 09/28/18 0732  TROPONINI <0.03 <0.03 <0.03    Recent Labs  Lab 09/27/18 1147  TROPIPOC 0.00     BNPNo results for input(s): BNP, PROBNP in the last 168 hours.   DDimer No results for input(s): DDIMER in the last 168 hours.   Radiology    Dg Chest 2 View  Result Date: 09/27/2018 CLINICAL DATA:  Chest pain EXAM: CHEST - 2 VIEW COMPARISON:  03/04/2018 FINDINGS: Cardiac enlargement. Status post median sternotomy with CABG procedure. No pleural effusion or edema identified. No airspace opacities. Dorsal spine stimulator noted. IMPRESSION: 1. No acute cardiopulmonary abnormalities. 2. Cardiac enlargement. Electronically Signed   By: Lovena Le  Clovis Riley M.D.   On: 09/27/2018 12:08    Cardiac Studies   Echo pending  Patient Profile     73 y.o. male with PMH CAD (CABG 1997, LIMA-LAD, SVG-Diag, SVG-OM, SVG-RCA) s/p stent to SVG-Lcx in 2004, then occluded SVG-LCx seen 2009 so had stent to ramus, also had stent to SVG-RCA at that time. Cath 2013 showed patent stents. History of atrial flutter s/p ablation 02/2017. Presented with unstable angina.  Assessment & Plan    Unstable angina with history of CAD, prior CABG and stents: -gradual worsening of fatigue with unremarkable stress test about 6 mos ago. However, has been having progressive symptoms and then severe chest discomfort at rest 1/3 -had another episode of chest discomfort after standing and shaving this AM -heparin drip, continue aspirin and clopidogrel -continue statin -echo pending -troponins negative x3 -plan for cath  tomorrow, consented yesterday -spent time with patient and wife discussing possible outcomes of cath, how we evaluate and manage NSVT (seen on outpatient monitor), recommended medications for lipids/CAD. All questions answered.  TIME SPENT WITH PATIENT: 25 minutes of direct patient care. More than 50% of that time was spent on coordination of care and counseling regarding cath, CAD counseling.  Buford Dresser, MD, PhD [facility]   For questions or updates, please contact [place] Please consult [email or web address] for contact info under     Signed, Buford Dresser, MD  09/28/2018, 11:29 AM

## 2018-09-29 ENCOUNTER — Encounter (HOSPITAL_COMMUNITY)
Admission: EM | Disposition: A | Discharge status: Home / Self Care | Payer: Self-pay | Source: Home / Self Care | Attending: Cardiology

## 2018-09-29 ENCOUNTER — Inpatient Hospital Stay (HOSPITAL_COMMUNITY): Payer: Medicare Other

## 2018-09-29 ENCOUNTER — Encounter (HOSPITAL_COMMUNITY): Payer: Self-pay | Admitting: Cardiology

## 2018-09-29 DIAGNOSIS — I25710 Atherosclerosis of autologous vein coronary artery bypass graft(s) with unstable angina pectoris: Secondary | ICD-10-CM

## 2018-09-29 DIAGNOSIS — I25110 Atherosclerotic heart disease of native coronary artery with unstable angina pectoris: Principal | ICD-10-CM

## 2018-09-29 DIAGNOSIS — R0602 Shortness of breath: Secondary | ICD-10-CM

## 2018-09-29 HISTORY — PX: ULTRASOUND GUIDANCE FOR VASCULAR ACCESS: SHX6516

## 2018-09-29 HISTORY — PX: LEFT HEART CATH AND CORS/GRAFTS ANGIOGRAPHY: CATH118250

## 2018-09-29 LAB — ECHOCARDIOGRAM COMPLETE
Height: 70 in
WEIGHTICAEL: 3412.8 [oz_av]

## 2018-09-29 LAB — CBC
HCT: 37.7 % — ABNORMAL LOW (ref 39.0–52.0)
Hemoglobin: 12.5 g/dL — ABNORMAL LOW (ref 13.0–17.0)
MCH: 31.1 pg (ref 26.0–34.0)
MCHC: 33.2 g/dL (ref 30.0–36.0)
MCV: 93.8 fL (ref 80.0–100.0)
PLATELETS: 123 10*3/uL — AB (ref 150–400)
RBC: 4.02 MIL/uL — ABNORMAL LOW (ref 4.22–5.81)
RDW: 15.8 % — ABNORMAL HIGH (ref 11.5–15.5)
WBC: 4.5 10*3/uL (ref 4.0–10.5)
nRBC: 0 % (ref 0.0–0.2)

## 2018-09-29 LAB — GLUCOSE, CAPILLARY
GLUCOSE-CAPILLARY: 137 mg/dL — AB (ref 70–99)
Glucose-Capillary: 122 mg/dL — ABNORMAL HIGH (ref 70–99)
Glucose-Capillary: 114 mg/dL — ABNORMAL HIGH (ref 70–99)
Glucose-Capillary: 99 mg/dL (ref 70–99)
Glucose-Capillary: 144 mg/dL — ABNORMAL HIGH (ref 70–99)

## 2018-09-29 LAB — HEPARIN LEVEL (UNFRACTIONATED): Heparin Unfractionated: 0.32 IU/mL (ref 0.30–0.70)

## 2018-09-29 SURGERY — LEFT HEART CATH AND CORS/GRAFTS ANGIOGRAPHY
Anesthesia: LOCAL

## 2018-09-29 MED ORDER — HEPARIN (PORCINE) IN NACL 1000-0.9 UT/500ML-% IV SOLN
INTRAVENOUS | Status: DC | PRN
  Administered 2018-09-29 (×2): 500 mL

## 2018-09-29 MED ORDER — LIDOCAINE HCL (PF) 1 % IJ SOLN
INTRAMUSCULAR | Status: DC | PRN
  Administered 2018-09-29: 2 mL

## 2018-09-29 MED ORDER — SODIUM CHLORIDE 0.9 % IV SOLN
INTRAVENOUS | Status: AC
  Administered 2018-09-29: 15:00:00 via INTRAVENOUS

## 2018-09-29 MED ORDER — SODIUM CHLORIDE 0.9% FLUSH: 3.0000 mL | INTRAVENOUS | Status: DC | PRN

## 2018-09-29 MED ORDER — MIDAZOLAM HCL 2 MG/2ML IJ SOLN
INTRAMUSCULAR | Status: AC
  Filled 2018-09-29: qty 2

## 2018-09-29 MED ORDER — VERAPAMIL HCL 2.5 MG/ML IV SOLN
INTRAVENOUS | Status: AC
  Filled 2018-09-29: qty 2

## 2018-09-29 MED ORDER — MIDAZOLAM HCL 2 MG/2ML IJ SOLN
INTRAMUSCULAR | Status: DC | PRN
  Administered 2018-09-29: 1 mg via INTRAVENOUS

## 2018-09-29 MED ORDER — ISOSORBIDE MONONITRATE ER 60 MG PO TB24
60.0000 mg | ORAL_TABLET | Freq: Every day | ORAL | Status: DC
  Administered 2018-09-30: 60 mg via ORAL
  Filled 2018-09-29: qty 1

## 2018-09-29 MED ORDER — HEPARIN SODIUM (PORCINE) 1000 UNIT/ML IJ SOLN
INTRAMUSCULAR | Status: DC | PRN
  Administered 2018-09-29: 5000 [IU] via INTRAVENOUS

## 2018-09-29 MED ORDER — SODIUM CHLORIDE 0.9 % IV SOLN: 250.0000 mL | INTRAVENOUS | Status: DC | PRN

## 2018-09-29 MED ORDER — SODIUM CHLORIDE 0.9% FLUSH
3.0000 mL | Freq: Two times a day (BID) | INTRAVENOUS | Status: DC
  Administered 2018-09-30: 3 mL via INTRAVENOUS

## 2018-09-29 MED ORDER — FENTANYL CITRATE (PF) 100 MCG/2ML IJ SOLN
INTRAMUSCULAR | Status: DC | PRN
  Administered 2018-09-29: 25 ug via INTRAVENOUS

## 2018-09-29 MED ORDER — VERAPAMIL HCL 2.5 MG/ML IV SOLN
INTRAVENOUS | Status: DC | PRN
  Administered 2018-09-29: 10 mL via INTRA_ARTERIAL

## 2018-09-29 MED ORDER — HEPARIN SODIUM (PORCINE) 1000 UNIT/ML IJ SOLN
INTRAMUSCULAR | Status: AC
  Filled 2018-09-29: qty 1

## 2018-09-29 MED ORDER — IOHEXOL 350 MG/ML SOLN
INTRAVENOUS | Status: DC | PRN
  Administered 2018-09-29: 115 mL via INTRA_ARTERIAL

## 2018-09-29 MED ORDER — HEPARIN (PORCINE) IN NACL 1000-0.9 UT/500ML-% IV SOLN
INTRAVENOUS | Status: AC
  Filled 2018-09-29: qty 1000

## 2018-09-29 MED ORDER — FENTANYL CITRATE (PF) 100 MCG/2ML IJ SOLN
INTRAMUSCULAR | Status: AC
  Filled 2018-09-29: qty 2

## 2018-09-29 MED ORDER — LIDOCAINE HCL (PF) 1 % IJ SOLN
INTRAMUSCULAR | Status: AC
  Filled 2018-09-29: qty 30

## 2018-09-29 SURGICAL SUPPLY — 14 items
CATH EXPO 5F MPA-1 (CATHETERS) ×1 IMPLANT
CATH INFINITI 5 FR IM (CATHETERS) ×1 IMPLANT
CATH INFINITI 5FR AL1 (CATHETERS) ×1 IMPLANT
CATH INFINITI 5FR MULTPACK ANG (CATHETERS) ×1 IMPLANT
DEVICE RAD COMP TR BAND LRG (VASCULAR PRODUCTS) ×1 IMPLANT
ELECT DEFIB PAD ADLT CADENCE (PAD) ×1 IMPLANT
GLIDESHEATH SLEND A-KIT 6F 22G (SHEATH) ×1 IMPLANT
GUIDEWIRE INQWIRE 1.5J.035X260 (WIRE) IMPLANT
INQWIRE 1.5J .035X260CM (WIRE) ×3
KIT HEART LEFT (KITS) ×3 IMPLANT
PACK CARDIAC CATHETERIZATION (CUSTOM PROCEDURE TRAY) ×3 IMPLANT
SHEATH PROBE COVER 6X72 (BAG) ×1 IMPLANT
TRANSDUCER W/STOPCOCK (MISCELLANEOUS) ×3 IMPLANT
TUBING CIL FLEX 10 FLL-RA (TUBING) ×3 IMPLANT

## 2018-09-29 NOTE — Progress Notes (Signed)
[place] for heparin Indication: chest pain/ACS  Allergies  Allergen Reactions  . Nortriptyline Other (See Comments)    Groggy at low dose  . Cymbalta [Duloxetine Hcl] Diarrhea    Patient Measurements: Height: 5\' 10"  (177.8 cm) Weight: 213 lb 4.8 oz (96.8 kg) IBW/kg (Calculated) : 73 Heparin Dosing Weight: 92.2 kg   Vital Signs: Temp: 97.9 F (36.6 C) (01/06 0524) Temp Source: Oral (01/06 0524) BP: 124/59 (01/06 0524) Pulse Rate: 56 (01/06 0524)  Labs: Recent Labs    09/27/18 1143 09/27/18 1726 09/28/18 0046 09/28/18 0732 09/28/18 0955 09/29/18 0540  HGB 13.4  --  12.6*  --   --   --   HCT 41.0  --  37.3*  --   --   --   PLT 158  --  129*  --   --   --   HEPARINUNFRC  --   --  0.43  --  0.45 0.32  CREATININE 0.99  --  0.95  --   --   --   TROPONINI  --  <0.03 <0.03 <0.03  --   --     Estimated Creatinine Clearance: 83.2 mL/min (by C-G formula based on SCr of 0.95 mg/dL).   Medical History: Past Medical History:  Diagnosis Date  . Atrial flutter ([place]) 02/02/2017  . CAD, multiple vessel   . Diabetes mellitus ([place])   . Dyslipidemia   . Hypertension     Assessment: 31 YOM with h/o of CAD here with increased chest pressure and concern for unstable angina. Pharmacy consulted to start IV heparin. H/H and Plt wnl. He is not on any anticoagulants at home   Heparin level therapeutic this morning, 0.32 units/mL on 1200 units/hr. CBC pending this am. For cath today.   Goal of Therapy:  Heparin level 0.3-0.7 units/ml Monitor platelets by anticoagulation protocol: Yes   Plan:  -Continue heparin infusion at 1200 units/hr -Monitor daily heparin level, CBC and s/s of bleeding   Erin Hearing PharmD., BCPS Clinical Pharmacist 09/29/2018 8:07 AM  Please check AMION for all [place] numbers

## 2018-09-29 NOTE — Interval H&P Note (Signed)
History and Physical Interval Note:  09/29/2018 1:17 PM  Ruben [name]  has presented today for surgery, with the diagnosis of unstable angina  The various methods of treatment have been discussed with the patient and family. After consideration of risks, benefits and other options for treatment, the patient has consented to  Procedure(s): LEFT HEART CATH AND CORS/GRAFTS ANGIOGRAPHY (N/A) as a surgical intervention .  The patient's history has been reviewed, patient examined, no change in status, stable for surgery.  I have reviewed the patient's chart and labs.  Questions were answered to the patient's satisfaction.    Cath Lab Visit (complete for each Cath Lab visit)  Clinical Evaluation Leading to the Procedure:   ACS: Yes.    Non-ACS:    Anginal Classification: CCS IV  Anti-ischemic medical therapy: Maximal Therapy (2 or more classes of medications)  Non-Invasive Test Results: No non-invasive testing performed  Prior CABG: Previous CABG    Glenetta Hew

## 2018-09-29 NOTE — Progress Notes (Addendum)
Progress Note  Patient Name: Ruben [name] Date of Encounter: 09/29/2018  Primary Cardiologist: Shelva Majestic, MD   Subjective   No further chest pain since yesterday. No dyspnea.   Inpatient Medications    Scheduled Meds: . aspirin EC  81 mg Oral Daily  . carvedilol  6.25 mg Oral BID WC  . clopidogrel  75 mg Oral Daily  . isosorbide mononitrate  30 mg Oral Daily  . losartan  25 mg Oral Daily  . omega-3 acid ethyl esters  2 capsule Oral Daily  . pregabalin  200 mg Oral TID  . ranolazine  1,000 mg Oral BID  . rosuvastatin  40 mg Oral QHS  . sodium chloride flush  3 mL Intravenous Q12H  . tapentadol  100 mg Oral TID AC & HS   Continuous Infusions: . sodium chloride    . sodium chloride 1 mL/kg/hr (09/29/18 0603)  . heparin 1,200 Units/hr (09/28/18 1842)   PRN Meds: sodium chloride, acetaminophen, ondansetron (ZOFRAN) IV, oxyCODONE-acetaminophen, sodium chloride flush   Vital Signs    Vitals:   09/28/18 1456 09/28/18 2018 09/28/18 2231 09/29/18 0524  BP: (!) 120/57 (!) 144/69  (!) 124/59  Pulse: (!) 54 (!) 57 (!) 59 (!) 56  Resp:  18  18  Temp:  98.1 F (36.7 C)  97.9 F (36.6 C)  TempSrc:  Oral  Oral  SpO2:  100%  97%  Weight:    96.8 kg  Height:        Intake/Output Summary (Last 24 hours) at 09/29/2018 1059 Last data filed at 09/29/2018 0931 Gross per 24 hour  Intake 600.89 ml  Output 1351 ml  Net -750.11 ml   Filed Weights   09/27/18 1133 09/28/18 0451 09/29/18 0524  Weight: 94.3 kg 96.8 kg 96.8 kg    Telemetry    NSR - Personally Reviewed  ECG    N/A  Physical Exam   GEN: No acute distress.   Neck: No JVD Cardiac: RRR, no murmurs, rubs, or gallops.  Respiratory: Clear to auscultation bilaterally. GI: Soft, nontender, non-distended  MS: No edema; No deformity. Neuro:  Nonfocal  Psych: Normal affect   Labs    Chemistry Recent Labs  Lab 09/27/18 1143 09/28/18 0046  NA 137 137  K 3.9 3.8  CL 105 105  CO2 26 26  GLUCOSE 106* 77    BUN 8 9  CREATININE 0.99 0.95  CALCIUM 9.1 8.8*  GFRNONAA >60 >60  GFRAA >60 >60  ANIONGAP 6 6     Hematology Recent Labs  Lab 09/27/18 1143 09/28/18 0046 09/29/18 0540  WBC 5.5 4.3 4.5  RBC 4.31 4.02* 4.02*  HGB 13.4 12.6* 12.5*  HCT 41.0 37.3* 37.7*  MCV 95.1 92.8 93.8  MCH 31.1 31.3 31.1  MCHC 32.7 33.8 33.2  RDW 15.8* 15.8* 15.8*  PLT 158 129* 123*    Cardiac Enzymes Recent Labs  Lab 09/27/18 1726 09/28/18 0046 09/28/18 0732  TROPONINI <0.03 <0.03 <0.03    Recent Labs  Lab 09/27/18 1147  [place] 0.00     Radiology    Dg Chest 2 View  Result Date: 09/27/2018 CLINICAL DATA:  Chest pain EXAM: CHEST - 2 VIEW COMPARISON:  03/04/2018 FINDINGS: Cardiac enlargement. Status post median sternotomy with CABG procedure. No pleural effusion or edema identified. No airspace opacities. Dorsal spine stimulator noted. IMPRESSION: 1. No acute cardiopulmonary abnormalities. 2. Cardiac enlargement. Electronically Signed   By: Kerby Moors M.D.   On: 09/27/2018 12:08  Cardiac Studies   Pending echo and cath  Patient Profile     73 y.o. male with PMH CAD (CABG 1997, LIMA-LAD, SVG-Diag, SVG-OM, SVG-RCA) s/p stent to SVG-Lcx in 2004, then occluded SVG-LCx seen 2009 so had stent to ramus, also had stent to SVG-RCA at that time. Cath 2013 showed patent stents. History of atrial flutter s/p ablation 02/2017. Presented with unstable angina.  Assessment & Plan    1. Unstable angina with hx of CABG and multiple stents afterwards - Gradual worsening of fatigue with unremarkable stress test about 6 mos ago. However, has been having progressive symptoms and then severe chest discomfort at rest 1/3. - No recurrent pain overnight. Had multiple question regarding cath, all answered.  - Troponin negative. Pending echo and cath today.  - Continue Heparin drip, aspirin, plavix, imdur, Crestor, losartan and Renexa. - He was only taking 1/2 table of 75mg  plavix prior to presentation due  to easy bruising (unknown timing)>> Educated>> he will need full table if required PCI. ? Symptoms due to in-stent thrombosis.   2. Hx of atrial flutter s/p Ablation  He was on Eliquis for one month and then changed to ASA only by [name].  3. HTN  4. HLD - 09/28/2018: Cholesterol 110; HDL 51; LDL Cholesterol 52; Triglycerides 37; VLDL 7  - Continue statin     For questions or updates, please contact [place] Please consult [email or web address] for contact info under     SignedLeanor Kail, PA  09/29/2018, 10:59 AM    Patient seen, examined. Available data reviewed. Agree with findings, assessment, and plan as outlined by Robbie Lis, PA.  The patient is independently interviewed and examined.  His wife is at the bedside.  He has no resting symptoms.  However he has had recurrent angina with low-level activity even since being hospitalized.  His anginal chest discomfort is associated with marked generalized weakness and fatigue.  He is willing to take aspirin and clopidogrel following PCI, but is hopeful that he can discontinue aspirin at some point in time.  He has had trouble tolerating both agents because of extensive bruising.  Prior to his hospitalization he has been taking one half of a Plavix tablet each day.  He understands that this is not a good option moving forward, especially if he requires PCI.  We reviewed potential findings and treatment options of his upcoming cardiac catheterization.  He has been through this several times following remote CABG and multiple cardiac catheterization/PCI procedures in the past.  All of his questions are answered.  Further plan/disposition pending his cardiac catheterization result.  Sherren Mocha, M.D. 09/29/2018 12:19 PM

## 2018-09-29 NOTE — Telephone Encounter (Signed)
Agree with going to the hospital.  He is very cognizant of his symptoms.  Agree repeat catheterization will most likely be necessary

## 2018-09-29 NOTE — H&P (View-Only) (Signed)
Progress Note  Patient Name: Ruben [name] Date of Encounter: 09/29/2018  Primary Cardiologist: Shelva Majestic, MD   Subjective   No further chest pain since yesterday. No dyspnea.   Inpatient Medications    Scheduled Meds: . aspirin EC  81 mg Oral Daily  . carvedilol  6.25 mg Oral BID WC  . clopidogrel  75 mg Oral Daily  . isosorbide mononitrate  30 mg Oral Daily  . losartan  25 mg Oral Daily  . omega-3 acid ethyl esters  2 capsule Oral Daily  . pregabalin  200 mg Oral TID  . ranolazine  1,000 mg Oral BID  . rosuvastatin  40 mg Oral QHS  . sodium chloride flush  3 mL Intravenous Q12H  . tapentadol  100 mg Oral TID AC & HS   Continuous Infusions: . sodium chloride    . sodium chloride 1 mL/kg/hr (09/29/18 0603)  . heparin 1,200 Units/hr (09/28/18 1842)   PRN Meds: sodium chloride, acetaminophen, ondansetron (ZOFRAN) IV, oxyCODONE-acetaminophen, sodium chloride flush   Vital Signs    Vitals:   09/28/18 1456 09/28/18 2018 09/28/18 2231 09/29/18 0524  BP: (!) 120/57 (!) 144/69  (!) 124/59  Pulse: (!) 54 (!) 57 (!) 59 (!) 56  Resp:  18  18  Temp:  98.1 F (36.7 C)  97.9 F (36.6 C)  TempSrc:  Oral  Oral  SpO2:  100%  97%  Weight:    96.8 kg  Height:        Intake/Output Summary (Last 24 hours) at 09/29/2018 1059 Last data filed at 09/29/2018 0931 Gross per 24 hour  Intake 600.89 ml  Output 1351 ml  Net -750.11 ml   Filed Weights   09/27/18 1133 09/28/18 0451 09/29/18 0524  Weight: 94.3 kg 96.8 kg 96.8 kg    Telemetry    NSR - Personally Reviewed  ECG    N/A  Physical Exam   GEN: No acute distress.   Neck: No JVD Cardiac: RRR, no murmurs, rubs, or gallops.  Respiratory: Clear to auscultation bilaterally. GI: Soft, nontender, non-distended  MS: No edema; No deformity. Neuro:  Nonfocal  Psych: Normal affect   Labs    Chemistry Recent Labs  Lab 09/27/18 1143 09/28/18 0046  NA 137 137  K 3.9 3.8  CL 105 105  CO2 26 26  GLUCOSE 106* 77    BUN 8 9  CREATININE 0.99 0.95  CALCIUM 9.1 8.8*  GFRNONAA >60 >60  GFRAA >60 >60  ANIONGAP 6 6     Hematology Recent Labs  Lab 09/27/18 1143 09/28/18 0046 09/29/18 0540  WBC 5.5 4.3 4.5  RBC 4.31 4.02* 4.02*  HGB 13.4 12.6* 12.5*  HCT 41.0 37.3* 37.7*  MCV 95.1 92.8 93.8  MCH 31.1 31.3 31.1  MCHC 32.7 33.8 33.2  RDW 15.8* 15.8* 15.8*  PLT 158 129* 123*    Cardiac Enzymes Recent Labs  Lab 09/27/18 1726 09/28/18 0046 09/28/18 0732  TROPONINI <0.03 <0.03 <0.03    Recent Labs  Lab 09/27/18 1147  [place] 0.00     Radiology    Dg Chest 2 View  Result Date: 09/27/2018 CLINICAL DATA:  Chest pain EXAM: CHEST - 2 VIEW COMPARISON:  03/04/2018 FINDINGS: Cardiac enlargement. Status post median sternotomy with CABG procedure. No pleural effusion or edema identified. No airspace opacities. Dorsal spine stimulator noted. IMPRESSION: 1. No acute cardiopulmonary abnormalities. 2. Cardiac enlargement. Electronically Signed   By: Kerby Moors M.D.   On: 09/27/2018 12:08  Cardiac Studies   Pending echo and cath  Patient Profile     73 y.o. male with PMH CAD (CABG 1997, LIMA-LAD, SVG-Diag, SVG-OM, SVG-RCA) s/p stent to SVG-Lcx in 2004, then occluded SVG-LCx seen 2009 so had stent to ramus, also had stent to SVG-RCA at that time. Cath 2013 showed patent stents. History of atrial flutter s/p ablation 02/2017. Presented with unstable angina.  Assessment & Plan    1. Unstable angina with hx of CABG and multiple stents afterwards - Gradual worsening of fatigue with unremarkable stress test about 6 mos ago. However, has been having progressive symptoms and then severe chest discomfort at rest 1/3. - No recurrent pain overnight. Had multiple question regarding cath, all answered.  - Troponin negative. Pending echo and cath today.  - Continue Heparin drip, aspirin, plavix, imdur, Crestor, losartan and Renexa. - He was only taking 1/2 table of 75mg  plavix prior to presentation due  to easy bruising (unknown timing)>> Educated>> he will need full table if required PCI. ? Symptoms due to in-stent thrombosis.   2. Hx of atrial flutter s/p Ablation  He was on Eliquis for one month and then changed to ASA only by [name].  3. HTN  4. HLD - 09/28/2018: Cholesterol 110; HDL 51; LDL Cholesterol 52; Triglycerides 37; VLDL 7  - Continue statin     For questions or updates, please contact [place] Please consult [email or web address] for contact info under     SignedLeanor Kail, PA  09/29/2018, 10:59 AM    Patient seen, examined. Available data reviewed. Agree with findings, assessment, and plan as outlined by Robbie Lis, PA.  The patient is independently interviewed and examined.  His wife is at the bedside.  He has no resting symptoms.  However he has had recurrent angina with low-level activity even since being hospitalized.  His anginal chest discomfort is associated with marked generalized weakness and fatigue.  He is willing to take aspirin and clopidogrel following PCI, but is hopeful that he can discontinue aspirin at some point in time.  He has had trouble tolerating both agents because of extensive bruising.  Prior to his hospitalization he has been taking one half of a Plavix tablet each day.  He understands that this is not a good option moving forward, especially if he requires PCI.  We reviewed potential findings and treatment options of his upcoming cardiac catheterization.  He has been through this several times following remote CABG and multiple cardiac catheterization/PCI procedures in the past.  All of his questions are answered.  Further plan/disposition pending his cardiac catheterization result.  Sherren Mocha, M.D. 09/29/2018 12:19 PM

## 2018-09-29 NOTE — Progress Notes (Signed)
  Echocardiogram 2D Echocardiogram has been performed.  Ruben [name] 09/29/2018, 3:41 PM

## 2018-09-30 LAB — GLUCOSE, CAPILLARY
Glucose-Capillary: 107 mg/dL — ABNORMAL HIGH (ref 70–99)
Glucose-Capillary: 154 mg/dL — ABNORMAL HIGH (ref 70–99)

## 2018-09-30 LAB — CBC
HCT: 35.7 % — ABNORMAL LOW (ref 39.0–52.0)
Hemoglobin: 12.2 g/dL — ABNORMAL LOW (ref 13.0–17.0)
MCH: 32.2 pg (ref 26.0–34.0)
MCHC: 34.2 g/dL (ref 30.0–36.0)
MCV: 94.2 fL (ref 80.0–100.0)
Platelets: 117 10*3/uL — ABNORMAL LOW (ref 150–400)
RBC: 3.79 MIL/uL — ABNORMAL LOW (ref 4.22–5.81)
RDW: 15.8 % — AB (ref 11.5–15.5)
WBC: 4.7 10*3/uL (ref 4.0–10.5)
nRBC: 0 % (ref 0.0–0.2)

## 2018-09-30 MED ORDER — ISOSORBIDE MONONITRATE ER 60 MG PO TB24: 60.0000 mg | ORAL_TABLET | Freq: Every day | ORAL | 6 refills | Status: DC

## 2018-09-30 NOTE — Discharge Summary (Addendum)
Discharge Summary    Patient ID: Ruben [name] MRN: [id number]; DOB: 1945/10/03  Admit date: 09/27/2018 Discharge date: 09/30/2018  Primary Care Provider: Lujean Amel, MD  Primary Cardiologist: Shelva Majestic, MD  Discharge Diagnoses    Active Problems:   Unstable angina (Person)   Allergies Allergies  Allergen Reactions  . Nortriptyline Other (See Comments)    Groggy at low dose  . Cymbalta [Duloxetine Hcl] Diarrhea    Diagnostic Studies/Procedures    LEFT HEART CATH AND CORS/GRAFTS ANGIOGRAPHY  09/29/18  Conclusion     Native Vessels. Stable.  Prox RCA to Mid RCA stent is 35% stenosed. Mid RCA to Dist RCA stent is 40% stenosed. Dist RCA lesion is 55% stenosed with 65% stenosed side branch in Ost RPDA.  Ost LAD to Prox LAD lesion is 100% stenosed. Prox LAD lesion is 90% stenosed. Prox LAD to Mid LAD lesion is 100% stenosed.  Previously placed Ost LM to Dist LM stent (unknown type) is widely patent with 0% stenosed side branch in Ost Ramus. Lat Ramus lesion is 30% stenosed.  Prox Cx to Mid Cx lesion is 100% stenosed. Ost 1st Mrg lesion is 100% stenosed.  ------------------  GRAFTS - Stable  LIMA-mLAD graft was not injected and is large. The graft exhibits a focal 40% lesion in a kink otherwise no disease. With normal antegrade flow  SVG-OM known to be 100% CTO.  SVG-1st Diag graft was visualized by angiography and is large. The graft exhibits no disease.  ------------------  SVG-rPDA graft was not injected and is large. The graft exhibits no disease. Previously placed Mid Graft to Dist Graft stent (unknown type) is widely patent.  LV end diastolic pressure is mildly elevated.   SUMMARY  Overall stable native coronary and graft disease but no significant changes from 2013 cardiac catheterization  Known CTO of ostial LAD, proximal circumflex-OM and SVG-OM.  Widely patent LIMA-LAD (with focal area of 40% stenosis at a kink), SVG-1stDiag and SVG-RPDA (with  patent stent)  Mildly elevated LVEDP  RECOMMENDATIONS  Continue medical management.  Will increase Imdur to 60 mg.  Anticipate that Ruben [name] should be able to discharge later on today after bedrest.     Echo 09/29/18 Study Conclusions  - Left ventricle: The cavity size was normal. Wall thickness was   increased in a pattern of moderate LVH. Systolic function was   normal. The estimated ejection fraction was in the range of 60%   to 65%. Wall motion was normal; there were no regional wall   motion abnormalities. Features are consistent with a pseudonormal   left ventricular filling pattern, with concomitant abnormal   relaxation and increased filling pressure (grade 2 diastolic   dysfunction). - Left atrium: The atrium was mildly dilated. - Right atrium: The atrium was mildly dilated.  History of Present Illness        73 y.o.malewith PMH CAD (CABG 1997, LIMA-LAD, SVG-Diag, SVG-OM, SVG-RCA) s/p stent to SVG-Lcx in 2004, then occluded SVG-LCx seen 2009 so had stent to ramus, also had stent to SVG-RCA at that time. Cath 2013 showed patent stents. History of atrial flutter s/p ablation 02/2017.Presented with unstable angina.   Hospital Course     Consultants: None  1. Unstable angina with hx of CABG and multiple stents afterwards - Gradual worsening of fatigue with unremarkable stress test about 6 mos ago. However, has been having progressive symptoms and then severe chest discomfort at rest1/3. - Troponin negative. Treated with heparin. Cath as above. Stable anatomy.  -  Increased Imdur to 60mg  daily.  - Ruben [name] was only taking 1/2 table of 75mg  plavix prior to presentation due to easy bruising (unknown timing)>> Educated to take full tablet. - Ruben [name] ambulated in hallway 3 times this morning without recurrent chest pain. His fatigue has improved. telemetry showed sinus bradycardia in 50s. Says his HR always runs in 4s. If no improvement at follow up, may consider reducing BB as  outpatient. Otherwise continue home medications.   2. Hx of atrial flutter s/p Ablation  Ruben [name] was on Eliquis for one month and then changed to ASA only by [name].  3. HTN - BP stable on current medication   4. HLD - 09/28/2018: Cholesterol 110; HDL 51; LDL Cholesterol 52; Triglycerides 37; VLDL 7  - Continue statin     Discharge Vitals Blood pressure 112/64, pulse (!) 55, temperature 98 F (36.7 C), resp. rate 18, height 5\' 10"  (1.778 m), weight 97.8 kg, SpO2 98 %.  Filed Weights   09/28/18 0451 09/29/18 0524 09/30/18 0232  Weight: 96.8 kg 96.8 kg 97.8 kg   Physical Exam  Constitutional: Ruben [name] is oriented to person, place, and time and well-developed, well-nourished, and in no distress.  HENT:  Head: Normocephalic and atraumatic.  Eyes: Pupils are equal, round, and reactive to light. Conjunctivae and EOM are normal.  Neck: Normal range of motion. Neck supple.  Cardiovascular: Normal rate and regular rhythm.  Left radial cath site stable  Pulmonary/Chest: Effort normal and breath sounds normal.  Abdominal: Soft. Bowel sounds are normal.  Musculoskeletal: Normal range of motion.  Neurological: Ruben [name] is alert and oriented to person, place, and time.  Skin: Skin is warm and dry.  Psychiatric: Affect normal.   Labs & Radiologic Studies    CBC Recent Labs    09/29/18 0540 09/30/18 0455  WBC 4.5 4.7  HGB 12.5* 12.2*  HCT 37.7* 35.7*  MCV 93.8 94.2  PLT 123* 725*   Basic Metabolic Panel Recent Labs    09/27/18 1143 09/28/18 0046  NA 137 137  K 3.9 3.8  CL 105 105  CO2 26 26  GLUCOSE 106* 77  BUN 8 9  CREATININE 0.99 0.95  CALCIUM 9.1 8.8*   Cardiac Enzymes Recent Labs    09/27/18 1726 09/28/18 0046 09/28/18 0732  TROPONINI <0.03 <0.03 <0.03   Fasting Lipid Panel Recent Labs    09/28/18 0046  CHOL 110  HDL 51  LDLCALC 52  TRIG 37  CHOLHDL 2.2   _____________  Dg Chest 2 View  Result Date: 09/27/2018 CLINICAL DATA:  Chest pain EXAM: CHEST - 2 VIEW  COMPARISON:  03/04/2018 FINDINGS: Cardiac enlargement. Status post median sternotomy with CABG procedure. No pleural effusion or edema identified. No airspace opacities. Dorsal spine stimulator noted. IMPRESSION: 1. No acute cardiopulmonary abnormalities. 2. Cardiac enlargement. Electronically Signed   By: Kerby Moors M.D.   On: 09/27/2018 12:08   Disposition   Pt is being discharged home today in good condition.  Follow-up Plans & Appointments    Follow-up Information    Almyra Deforest, [place]. Go on 10/15/2018.   Specialties:  Cardiology, Radiology Why:  @8 :30am for hospital follow up. Please arrive 15 minutes early  Contact information: [address] [place] [address] [phone]          Discharge Instructions    Diet - low sodium heart healthy   Complete by:  As directed    Discharge instructions   Complete by:  As directed  No driving for 48 hours. No lifting over 5 lbs for 1 week. No sexual activity for 1 week.  Keep procedure site clean & dry. If you notice increased pain, swelling, bleeding or pus, call/return!  You may shower, but no soaking baths/hot tubs/pools for 1 week.   Increase activity slowly   Complete by:  As directed       Discharge Medications   Allergies as of 09/30/2018      Reactions   Nortriptyline Other (See Comments)   Groggy at low dose   Cymbalta [duloxetine Hcl] Diarrhea      Medication List    TAKE these medications   aspirin EC 81 MG tablet Take 1 tablet (81 mg total) by mouth daily.   carvedilol 6.25 MG tablet Commonly known as:  COREG TAKE 1 TABLET(6.25 MG) BY MOUTH TWICE DAILY WITH A MEAL What changed:  See the new instructions.   clopidogrel 75 MG tablet Commonly known as:  PLAVIX Take 1 tablet (75 mg total) by mouth daily.   CO Q-10 PO Take 1 capsule by mouth daily.   IRON-C PO Take 1 capsule by mouth 2 (two) times a week.   isosorbide mononitrate 60 MG 24 hr tablet Commonly known as:  IMDUR Take 1  tablet (60 mg total) by mouth daily. What changed:    medication strength  how much to take   losartan 25 MG tablet Commonly known as:  COZAAR Take 1 tablet (25 mg total) by mouth daily.   METANX PO Take 1 tablet by mouth daily.   niacin 1000 MG CR tablet Commonly known as:  NIASPAN TAKE 1 TABLET(1000 MG) BY MOUTH AT BEDTIME What changed:  See the new instructions.   nitroGLYCERIN 0.4 MG/SPRAY spray Commonly known as:  NITROLINGUAL Place 1 spray under the tongue every 5 (five) minutes x 3 doses as needed for chest pain.   NUCYNTA ER 200 MG Tb12 Generic drug:  Tapentadol HCl Take 200 mg by mouth 2 (two) times daily.   omega-3 acid ethyl esters 1 g capsule Commonly known as:  LOVAZA Take 2 capsules (2 g total) by mouth daily.   ONGLYZA 5 MG Tabs tablet Generic drug:  saxagliptin HCl Take 5 mg by mouth daily.   OVER THE COUNTER MEDICATION Take 5-6 drops by mouth See admin instructions. CBD oil for pain relief   OVER THE COUNTER MEDICATION Take 5 mLs by mouth See admin instructions. Take 5 ml Tumeric by mouth as need for acid   oxyCODONE-acetaminophen 5-325 MG tablet Commonly known as:  PERCOCET/ROXICET Take 1 tablet by mouth daily as needed for moderate pain.   pioglitazone 45 MG tablet Commonly known as:  ACTOS Take 45 mg by mouth daily.   pregabalin 200 MG capsule Commonly known as:  LYRICA Take 200 mg by mouth 3 (three) times daily.   Quercetin 250 MG Tabs Take 500 mg by mouth 2 (two) times daily.   ranolazine 1000 MG SR tablet Commonly known as:  RANEXA TAKE 1 TABLET BY MOUTH TWICE DAILY. GENERIC EQUIVALENT FOR RANEXA. What changed:  See the new instructions.   rosuvastatin 40 MG tablet Commonly known as:  CRESTOR Take 1 tablet (40 mg total) by mouth daily. What changed:  when to take this   VITAMIN C PO Take 1 tablet by mouth daily.   Vitamin D3 125 MCG (5000 UT) Caps Take 5,000-10,000 Units by mouth See admin instructions. Take 5,000 units by  mouth once a day and rotate with 10,000  units every other day   vitamin E 400 UNIT capsule Generic drug:  vitamin E Take 400 Units by mouth daily.        Acute coronary syndrome (MI, NSTEMI, STEMI, etc) this admission?: No.    Outstanding Labs/Studies   None  Duration of Discharge Encounter   Greater than 30 minutes including physician time.  SignedCrista Luria Whitestone, PA 09/30/2018, 11:22 AM   I have personally seen and examined this patient. I agree with the assessment and plan as outlined above. Cardiac cath yesterday with stable CAD/grafts. Echo with normal LV systolic function, no valve disease. Mild anemia. No chest pain this am. Imdur increased to 60 mg daily. Will plan to continue current medications. Discharge home today. Follow up with Dr. Claiborne Billings.   Lauree Chandler 09/30/2018 11:58 AM

## 2018-09-30 NOTE — Plan of Care (Signed)
  Problem: Education: Goal: Understanding of cardiac disease, CV risk reduction, and recovery process will improve Outcome: Progressing   Problem: Education: Goal: Knowledge of General Education information will improve Description Including pain rating scale, medication(s)/side effects and non-pharmacologic comfort measures Outcome: Progressing   Problem: Activity: Goal: Risk for activity intolerance will decrease Outcome: Progressing   Problem: Safety: Goal: Ability to remain free from injury will improve Outcome: Progressing

## 2018-09-30 NOTE — Consult Note (Signed)
[facility] Primary Care Navigator  09/30/2018  Ruben [name] 1946-06-26 [id number]   Met with patient and wife Jeral Fruit) at the bedsideto identify possible discharge needs.   Patient reports having "instant fatigue/ extreme tiredness, chest pressure and shortness of breath that had resulted to this admission. (unstable angina status post cardiac cath, hyperlipidemia, HTN- stable)  Patient endorsesDr.Dibas Koirala with [place] at [facility] care provider.   Patient shared using [facility] on [address] and [employer] Order service to obtain medications without any problem so far.   Patientstatesthathehas beenman[facility] "[facility]" system filled every 3 weeks.  Patientverbalized that hehas beendriving prior to admission buthis wifewill be able to providetransportation tohisdoctors' appointmentsif needed after discharge.  Patient lives withhis wifewho will serve ashisprimary caregiver at home when needed.  Anticipated discharge plan is home per patient.  Patientand wife voiced understanding to call primary care provider's office for a post discharge follow-up appointment [place]- 2 weeks orsooner if needs arise. Patient letter (with PCP's contact number) wasprovided astheir reminder.  Discussed with patient and wiferegarding [facility] services available for health management andresourcesat homebut both denied any needs or concernsat this time. Patient and wife expressedunderstandingof needto seekreferral from primary care provider to [facility] care management ifdeemed necessary and appropriatefor anyservicesin the near future.  [facility] care management information was provided for futureneeds thathemay have.  However, patient hadverbally agreedand optedforEMMIcalls tofollow-up withhisrecovery at home.   Referral made for [facility] calls after  discharge.   For additional questions please contact:  Edwena Felty A. Dovber Ernest, BSN, RN-BC [facility] PRIMARY CARE Navigator Cell: [phone]

## 2018-10-03 ENCOUNTER — Other Ambulatory Visit: Payer: Self-pay | Admitting: Cardiovascular Disease

## 2018-10-09 DIAGNOSIS — E1140 Type 2 diabetes mellitus with diabetic neuropathy, unspecified: Secondary | ICD-10-CM | POA: Diagnosis not present

## 2018-10-09 DIAGNOSIS — M79671 Pain in right foot: Secondary | ICD-10-CM | POA: Diagnosis not present

## 2018-10-09 DIAGNOSIS — M79672 Pain in left foot: Secondary | ICD-10-CM | POA: Diagnosis not present

## 2018-10-14 NOTE — Progress Notes (Signed)
Cardiology Office Note   Date:  10/15/2018   ID:  Ruben [name] September 16, 1946, MRN [id number]  PCP:  Ruben Amel, MD  Cardiologist:  Dr. Claiborne [name]  No chief complaint on file.    History of Present Illness: Ruben [name] is a 73 y.o. male who presents for post hospitalization follow up after admission for unstable angina. He has a history of CAD (CABG in 1997, with LIMA to LAD, SVG -Diagonal, SVG to OM, SVG to RCA), stenting of the SVG-LCx, occluded SVG to LCx noted in 2009, stent to Ramus, and stent to SVG to RCA at that time.   He has subsequent cardiac cath on 09/29/2018 revealing stable anatomy with no significant changes from 2013. Known CTO of the LAD, proximal Cx-Om and SVG to OM. Widely patent LIMA to LAD with focal area of 40% stenosis with a kink), SVG-1st diagonal and SVG to RPDA with patent stent.   On discharge 09/30/2018, he was doing well and had no recurrence of chest pain. He was bradycardic at baseline. May need to consider reducing carvedilol is this is persistent.   He comes today with multiple questions about his labs, echo, and cath. He continues to have episodes where he has severe weakness but only a few times a week.  He denies dizziness or near syncope.  He states that he feels so tired that he has to lay down. Other days he feels normal. He is staying hydrated and walks daily.   Past Medical History:  Diagnosis Date  . Atrial flutter ([place]) 02/02/2017  . CAD, multiple vessel   . Diabetes mellitus ([place])   . Dyslipidemia   . Hypertension     Past Surgical History:  Procedure Laterality Date  . A-FLUTTER ABLATION N/A 02/05/2017   Procedure: A-Flutter Ablation;  Surgeon: Ruben Grayer, MD;  Location: [place] [facility];  Service: Cardiovascular;  Laterality: N/A;  . CARDIAC CATHETERIZATION  12/13/2011   No intervention - recommend medical therapy with increased medical trial  . CARDIOVASCULAR STRESS TEST  10/26/2010   No scintigraphic evidence of inducible  myocardial ischemia. No ECG changes. EKG negative for ischemia.  . CORONARY ARTERY BYPASS GRAFT  1997   LIMA-LAD, vein graft to diagonal, vein graft to obtuse marginal, and vein graft to RCA  . LEFT HEART CATH AND CORS/GRAFTS ANGIOGRAPHY N/A 09/29/2018   Procedure: LEFT HEART CATH AND CORS/GRAFTS ANGIOGRAPHY;  Surgeon: Ruben Man, MD;  Location: [place] [facility];  Service: Cardiovascular;  Laterality: N/A;  . LEFT HEART CATHETERIZATION WITH CORONARY/GRAFT ANGIOGRAM N/A 12/13/2011   Procedure: LEFT HEART CATHETERIZATION WITH Ruben [name];  Surgeon: Ruben Sine, MD;  Location: [facility] CATH LAB;  Service: Cardiovascular;  Laterality: N/A;  . LOWER EXTREMITY ARTERIAL DOPPLER  01/30/2000   Normal arterial study  . TEE WITHOUT CARDIOVERSION N/A 02/04/2017   Procedure: TRANSESOPHAGEAL ECHOCARDIOGRAM (TEE);  Surgeon: Ruben Spark, MD;  Location: [facility] ENDOSCOPY;  Service: Cardiovascular;  Laterality: N/A;  . TRANSTHORACIC ECHOCARDIOGRAM  12/12/2011   EF >55%, LA severely dilated.  Ruben [name] ULTRASOUND GUIDANCE FOR VASCULAR ACCESS  09/29/2018   Procedure: Ultrasound Guidance For Vascular Access;  Surgeon: Ruben Man, MD;  Location: [place] [facility];  Service: Cardiovascular;;     Current Outpatient Medications  Medication Sig Dispense Refill  . Ascorbic Acid (VITAMIN C PO) Take 1 tablet by mouth daily.    Ruben [name] aspirin EC 81 MG tablet Take 1 tablet (81 mg total) by mouth daily. 90 tablet 3  .  carvedilol (COREG) 6.25 MG tablet TAKE 1 TABLET(6.25 MG) BY MOUTH TWICE DAILY WITH A MEAL (Patient taking differently: Take 6.25 mg by mouth 2 (two) times daily with a meal. ) 180 tablet 3  . Cholecalciferol (VITAMIN D3) 5000 units CAPS Take 5,000-10,000 Units by mouth See admin instructions. Take 5,000 units by mouth once a day and rotate with 10,000 units every other day    . clopidogrel (PLAVIX) 75 MG tablet Take 1 tablet (75 mg total) by mouth daily. 90 tablet 3  . Coenzyme Q10 (CO Q-10 PO) Take 1  capsule by mouth daily.    . Ferrous Gluconate-C-Folic Acid (IRON-C PO) Take 1 capsule by mouth 2 (two) times a week.    . isosorbide mononitrate (IMDUR) 60 MG 24 hr tablet Take 1 tablet (60 mg total) by mouth daily. 30 tablet 6  . L-Methylfolate-B6-B12 (METANX PO) Take 1 tablet by mouth daily.     Ruben [name] losartan (COZAAR) 25 MG tablet Take 1 tablet (25 mg total) by mouth daily. 30 tablet 12  . niacin (NIASPAN) 1000 MG CR tablet TAKE 1 TABLET(1000 MG) BY MOUTH AT BEDTIME (Patient taking differently: Take 1,000 mg by mouth at bedtime. ) 90 tablet 1  . nitroGLYCERIN (NITROLINGUAL) 0.4 MG/SPRAY spray Place 1 spray under the tongue every 5 (five) minutes x 3 doses as needed for chest pain. 12 g 11  . NUCYNTA ER 200 MG TB12 Take 200 mg by mouth 2 (two) times daily.  0  . omega-3 acid ethyl esters (LOVAZA) 1 g capsule Take 2 capsules (2 g total) by mouth daily. 180 capsule 3  . OVER THE COUNTER MEDICATION Take 5-6 drops by mouth See admin instructions. CBD oil for pain relief    . OVER THE COUNTER MEDICATION Take 5 mLs by mouth See admin instructions. Take 5 ml Tumeric by mouth as need for acid    . oxyCODONE-acetaminophen (PERCOCET/ROXICET) 5-325 MG tablet Take 1 tablet by mouth daily as needed for moderate pain.     . pioglitazone (ACTOS) 45 MG tablet Take 45 mg by mouth daily.    . pregabalin (LYRICA) 200 MG capsule Take 200 mg by mouth 3 (three) times daily.     . Quercetin 250 MG TABS Take 500 mg by mouth 2 (two) times daily.    . ranolazine (RANEXA) 1000 MG SR tablet TAKE 1 TABLET BY MOUTH TWICE DAILY. GENERIC EQUIVALENT FOR RANEXA. (Patient taking differently: Take 1,000 mg by mouth 2 (two) times daily. ) 180 tablet 1  . rosuvastatin (CRESTOR) 40 MG tablet Take 1 tablet (40 mg total) by mouth daily. (Patient taking differently: Take 40 mg by mouth at bedtime. ) 90 tablet 3  . saxagliptin HCl (ONGLYZA) 5 MG TABS tablet Take 5 mg by mouth daily.    . vitamin E (VITAMIN E) 400 UNIT capsule Take 400 Units  by mouth daily.     No current facility-administered medications for this visit.     Allergies:   Nortriptyline and Cymbalta [duloxetine hcl]    Social History:  The patient  reports that he has never smoked. He has never used smokeless tobacco. He reports current alcohol use. He reports current drug use.   Family History:  The patient's family history includes Diabetes in his father; Heart disease in his father; Hyperlipidemia in his sister.    ROS: All other systems are reviewed and negative. Unless otherwise mentioned in H&P    PHYSICAL EXAM: VS:  BP 126/62   Pulse 64  Ht 5\' 10"  (1.778 m)   Wt 214 lb 9.6 oz (97.3 kg)   BMI 30.79 kg/m  , BMI Body mass index is 30.79 kg/m. GEN: Well nourished, well developed, in no acute distress HEENT: normal Neck: no JVD, carotid bruits, or masses Cardiac: RRR; no murmurs, rubs, or gallops,no edema  Respiratory:  Clear to auscultation bilaterally, normal work of breathing GI: soft, nontender, nondistended, + BS MS: no deformity or atrophy Skin: warm and dry, no rash Neuro:  Strength and sensation are intact Psych: euthymic mood, full affect   EKG:  Not completed this office visit.   Recent Labs: 06/11/2018: ALT 20; TSH 1.870 09/28/2018: BUN 9; Creatinine, Ser 0.95; Potassium 3.8; Sodium 137 09/30/2018: Hemoglobin 12.2; Platelets 117    Lipid Panel    Component Value Date/Time   CHOL 110 09/28/2018 0046   CHOL 125 06/11/2018 0819   CHOL 127 11/09/2015 0854   CHOL 107 02/12/2014 0801   TRIG 37 09/28/2018 0046   TRIG 57 11/09/2015 0854   TRIG 37 02/12/2014 0801   HDL 51 09/28/2018 0046   HDL 55 06/11/2018 0819   HDL 55 11/09/2015 0854   HDL 57 02/12/2014 0801   CHOLHDL 2.2 09/28/2018 0046   VLDL 7 09/28/2018 0046   LDLCALC 52 09/28/2018 0046   LDLCALC 59 06/11/2018 0819   LDLCALC 61 11/09/2015 0854   LDLCALC 43 02/12/2014 0801      Wt Readings from Last 3 Encounters:  10/15/18 214 lb 9.6 oz (97.3 kg)  09/30/18 215 lb 8  oz (97.8 kg)  09/03/18 218 lb (98.9 kg)      Other studies Reviewed: LEFT HEART CATH AND CORS/GRAFTS ANGIOGRAPHY  09/29/18  Conclusion     Native Vessels. Stable.  Prox RCA to Mid RCA stent is 35% stenosed. Mid RCA to Dist RCA stent is 40% stenosed. Dist RCA lesion is 55% stenosed with 65% stenosed side branch in Ost RPDA.  Ost LAD to Prox LAD lesion is 100% stenosed. Prox LAD lesion is 90% stenosed. Prox LAD to Mid LAD lesion is 100% stenosed.  Previously placed Ost LM to Dist LM stent (unknown type) is widely patent with 0% stenosed side branch in Ost Ramus. Lat Ramus lesion is 30% stenosed.  Prox Cx to Mid Cx lesion is 100% stenosed. Ost 1st Mrg lesion is 100% stenosed.  ------------------  GRAFTS - Stable  LIMA-mLAD graft was not injected and is large. The graft exhibits a focal 40% lesion in a kink otherwise no disease. With normal antegrade flow  SVG-OM known to be 100% CTO.  SVG-1st Diag graft was visualized by angiography and is large. The graft exhibits no disease.  ------------------  SVG-rPDA graft was not injected and is large. The graft exhibits no disease. Previously placed Mid Graft to Dist Graft stent (unknown type) is widely patent.  LV end diastolic pressure is mildly elevated.  SUMMARY  Overall stable native coronary and graft disease but no significant changes from 2013 cardiac catheterization  Known CTO of ostial LAD, proximal circumflex-OM and SVG-OM.  Widely patent LIMA-LAD (with focal area of 40% stenosis at a kink), SVG-1stDiag and SVG-RPDA (with patent stent)  Mildly elevated LVEDP  RECOMMENDATIONS  Continue medical management. Will increase Imdur to 60 mg.  Anticipate that he should be able to discharge later on today after bedrest.     Echo 09/29/18 Study Conclusions  - Left ventricle: The cavity size was normal. Wall thickness was increased in a pattern of moderate LVH. Systolic function  was normal. The estimated  ejection fraction was in the range of 60% to 65%. Wall motion was normal; there were no regional wall motion abnormalities. Features are consistent with a pseudonormal left ventricular filling pattern, with concomitant abnormal relaxation and increased filling pressure (grade 2 diastolic dysfunction). - Left atrium: The atrium was mildly dilated. - Right atrium: The atrium was mildly dilated.   ASSESSMENT AND PLAN:  1.  CAD: S/P cardiac cath with no evidence of in-stent stenosis, essentially unchanged from prior cath. He has multiple questions about this test. I have answer as many as I could. I have also gone over the cath illustration with him.  2. Grade II Diastolic Dysfunction; I have reviewed his echo report with him and answered  Multiple questions about diastolic dysfunction. He is advised to reduce salt intake, and keep BP under control. It's okay to walk each day   3. Periods of weakness: I am uncertain that his bradycardia is causing this as he only feels it every few days. I will not decrease the coreg at this time. He is due to see Dr. Claiborne [name] in 3 weeks. Defer to him for changes in regimen.   4. Anemia: He was not significantly anemic in the last labs, however he was found to have thrombocytopenia.  I will check an anemia profile and iron levels. Continue Plavix.    Current medicines are reviewed at length with the patient today.    Labs/ tests ordered today include: Anemia profile. Phill Myron. [place] Pugh, ANP, AACC   10/15/2018 12:33 PM    [place] [address] Office [phone] Fax [phone]

## 2018-10-15 ENCOUNTER — Encounter: Payer: Self-pay | Admitting: Adult Health

## 2018-10-15 ENCOUNTER — Ambulatory Visit: Payer: Medicare Other | Admitting: Physician Assistant

## 2018-10-15 ENCOUNTER — Ambulatory Visit (INDEPENDENT_AMBULATORY_CARE_PROVIDER_SITE_OTHER): Payer: Medicare Other | Admitting: Adult Health

## 2018-10-15 VITALS — BP 126/62 | HR 64 | Ht 70.0 in | Wt 214.6 lb

## 2018-10-15 DIAGNOSIS — I2510 Atherosclerotic heart disease of native coronary artery without angina pectoris: Secondary | ICD-10-CM | POA: Diagnosis not present

## 2018-10-15 DIAGNOSIS — I5032 Chronic diastolic (congestive) heart failure: Secondary | ICD-10-CM

## 2018-10-15 DIAGNOSIS — Z79899 Other long term (current) drug therapy: Secondary | ICD-10-CM | POA: Diagnosis not present

## 2018-10-15 DIAGNOSIS — I10 Essential (primary) hypertension: Secondary | ICD-10-CM | POA: Diagnosis not present

## 2018-10-15 NOTE — Patient Instructions (Signed)
Follow-Up: You will need a follow up appointment [place]. You may see Shelva Majestic, MD or one of the following Advanced Practice Providers on your designated Care Team:  Almyra Deforest, PA-C  Fabian Sharp, PA-C   Labwork: ANEMIA PROFILE TODAY HERE IN OUR [facility]. Take the provided lab slips with you to the lab for your blood draw.  When you have labs (blood work) and your tests are completely normal, you will receive your results ONLY by [place] (if you have MyChart) -OR- A paper copy in the mail.  Medication Instructions:  NO CHANGES- Your physician recommends that you continue on your current medications as directed. Please refer to the Current Medication list given to you today. If you need a refill on your cardiac medications before your next appointment, please call your pharmacy.  At [facility], you and your health needs are our priority.  As part of our continuing mission to provide you with exceptional heart care, we have created designated Provider Care Teams.  These Care Teams include your primary Cardiologist (physician) and Advanced Practice Providers (APPs -  Physician Assistants and Nurse Practitioners) who all work together to provide you with the care you need, when you need it.  Thank you for choosing [facility] at [facility]!!

## 2018-10-16 LAB — ANEMIA PROFILE B
Basophils Absolute: 0 10*3/uL (ref 0.0–0.2)
Basos: 1 %
EOS (ABSOLUTE): 0.1 10*3/uL (ref 0.0–0.4)
Eos: 2 %
Ferritin: 63 ng/mL (ref 30–400)
Folate: >20 ng/mL (ref >3.0)
Hematocrit: 40.4 % (ref 37.5–51.0)
Hemoglobin: 13.5 g/dL (ref 13.0–17.7)
Immature Grans (Abs): 0 10*3/uL (ref 0.0–0.1)
Immature Granulocytes: 0 %
Iron Saturation: 19 % (ref 15–55)
Iron: 79 ug/dL (ref 38–169)
Lymphocytes Absolute: 1.1 10*3/uL (ref 0.7–3.1)
Lymphs: 25 %
MCH: 31.7 pg (ref 26.6–33.0)
MCHC: 33.4 g/dL (ref 31.5–35.7)
MCV: 95 fL (ref 79–97)
Monocytes Absolute: 0.6 10*3/uL (ref 0.1–0.9)
Monocytes: 15 %
Neutrophils Absolute: 2.4 10*3/uL (ref 1.4–7.0)
Neutrophils: 57 %
Platelets: 184 10*3/uL (ref 150–450)
RBC: 4.26 x10E6/uL (ref 4.14–5.80)
RDW: 14 % (ref 11.6–15.4)
RETIC CT PCT: 1.7 % (ref 0.6–2.6)
Total Iron Binding Capacity: 408 ug/dL (ref 250–450)
UIBC: 329 ug/dL (ref 111–343)
Vitamin B-12: >2000 pg/mL — ABNORMAL HIGH (ref 232–1245)
WBC: 4.2 10*3/uL (ref 3.4–10.8)

## 2018-10-22 DIAGNOSIS — G894 Chronic pain syndrome: Secondary | ICD-10-CM | POA: Diagnosis not present

## 2018-10-22 DIAGNOSIS — G609 Hereditary and idiopathic neuropathy, unspecified: Secondary | ICD-10-CM | POA: Diagnosis not present

## 2018-11-05 ENCOUNTER — Ambulatory Visit (INDEPENDENT_AMBULATORY_CARE_PROVIDER_SITE_OTHER): Payer: Medicare Other | Admitting: Cardiovascular Disease

## 2018-11-05 ENCOUNTER — Encounter: Payer: Self-pay | Admitting: Cardiovascular Disease

## 2018-11-05 VITALS — BP 152/82 | HR 62 | Ht 70.0 in | Wt 217.0 lb

## 2018-11-05 DIAGNOSIS — I4892 Unspecified atrial flutter: Secondary | ICD-10-CM | POA: Diagnosis not present

## 2018-11-05 DIAGNOSIS — E782 Mixed hyperlipidemia: Secondary | ICD-10-CM

## 2018-11-05 DIAGNOSIS — E1140 Type 2 diabetes mellitus with diabetic neuropathy, unspecified: Secondary | ICD-10-CM

## 2018-11-05 DIAGNOSIS — Z951 Presence of aortocoronary bypass graft: Secondary | ICD-10-CM

## 2018-11-05 DIAGNOSIS — I2510 Atherosclerotic heart disease of native coronary artery without angina pectoris: Secondary | ICD-10-CM | POA: Diagnosis not present

## 2018-11-05 DIAGNOSIS — Z79899 Other long term (current) drug therapy: Secondary | ICD-10-CM

## 2018-11-05 DIAGNOSIS — I10 Essential (primary) hypertension: Secondary | ICD-10-CM | POA: Diagnosis not present

## 2018-11-05 MED ORDER — RANOLAZINE ER 1000 MG PO TB12: ORAL_TABLET | ORAL | 1 refills | Status: DC

## 2018-11-05 MED ORDER — CLOPIDOGREL BISULFATE 75 MG PO TABS: 75.0000 mg | ORAL_TABLET | Freq: Every day | ORAL | 3 refills | Status: DC

## 2018-11-05 MED ORDER — OMEGA-3-ACID ETHYL ESTERS 1 G PO CAPS: 2.0000 | ORAL_CAPSULE | Freq: Every day | ORAL | 3 refills | Status: DC

## 2018-11-05 MED ORDER — CARVEDILOL 6.25 MG PO TABS: ORAL_TABLET | ORAL | 3 refills | Status: DC

## 2018-11-05 MED ORDER — ROSUVASTATIN CALCIUM 40 MG PO TABS: 40.0000 mg | ORAL_TABLET | Freq: Every day | ORAL | 3 refills | Status: DC

## 2018-11-05 MED ORDER — LOSARTAN POTASSIUM 25 MG PO TABS: 25.0000 mg | ORAL_TABLET | Freq: Every day | ORAL | 3 refills | Status: DC

## 2018-11-05 MED ORDER — SPIRONOLACTONE 25 MG PO TABS: 12.5000 mg | ORAL_TABLET | Freq: Every day | ORAL | 0 refills | Status: DC

## 2018-11-05 MED ORDER — NIACIN ER (ANTIHYPERLIPIDEMIC) 1000 MG PO TBCR: EXTENDED_RELEASE_TABLET | ORAL | 1 refills | Status: DC

## 2018-11-05 MED ORDER — NITROGLYCERIN 0.4 MG/SPRAY TL SOLN: 1.0000 | 11 refills | Status: DC | PRN

## 2018-11-05 MED ORDER — ISOSORBIDE MONONITRATE ER 60 MG PO TB24: 60.0000 mg | ORAL_TABLET | Freq: Every day | ORAL | 3 refills | Status: DC

## 2018-11-05 NOTE — Progress Notes (Signed)
Patient ID: Ruben [name], male   DOB: June 25, 1946, 73 y.o.   MRN: [id number]    PCP: Dr. Dagmar Hait  HPI: Ruben [name] is a 73 y.o. male who presents for a 5 month cardiology followup evaluation.  [name] has established CAD and in 1997 underwent CABG surgery with a LIMA to the LAD, vein to the diagonal, vein to the obtuse marginal, and vein to the right coronary artery. In November 2004 he underwent stenting to the vein graft to the circumflex vessel. In December 2009 the graft to the circumflex was occluded and he underwent stenting of the left main into the superior ramus intermediate vessel. At that time he also underwent stenting of the vein graft to the right coronary artery. His last catheterization which was done for repeat chest pain in March 2013 showed patent stents. He has known occlusion of the graft that supplied the circumflex marginal vessel. The graft to diagonal vessel was patent and filled the proximal LAD system and the LIMA graft which filled the mid LAD system was widely patent.  He has ahistory of mixed hyperlipidemia and has been on aggressive lipid-lowering therapy over the past 18 years. He also has developed a peripheral neuropathy. He has mild diabetes mellitus and is now followed by [name]. In April 2014 which labs showed a total cholesterol was 125, triglycerides 52, HDL 55, LDL 52 on aggressive combination therapy consisting of Niaspan 2 g daily, Crestor 40 mg daily, Lovaza 2 g daily.  Initially, we had followed him with [facility] heart lab assessments.  On 12/08/2013 a followup nuclear perfusion study continued to show normal perfusion without scar or ischemia on his current medical therapy.  Ejection fraction was 64%.  He is retired for several years and with more time he exercises regularly and walks up to 3 -5 miles per day approximately 4-5 days per week and does yoga 3 days per week.  He denies any recurrent anginal symptoms.  He denies any shortness of breath.   He denies PND, orthopnea.  He denies palpitations.   Laboratory in May 2015 showed significant aggressive lipid management with an LDL particle number now at 371 and calculated LDL 43, HDL 57, triglycerides 37, total cholesterol 107.  Small LDL particles of 156.  He still had an increased VLDL size.  Insulin resistance score was less than 25.  At that time, I recommended he reduce his Niaspan from 2000 mg to 1000 mg a discussed the possibility of discontinuing this altogether in the future depending upon subsequent levels.  In March 2017 he underwent a repeat echo Doppler study which showed mild LVH with normal systolic function without wall motion abnormalities.  There was very mild aortic regurgitation.  His aortic root dimension measured 39 mm.  His left atrium was mildly dilated.  Pulmonary pressures were normal.  He also underwent a repeat lipoprofile which showed an LDL particle #856.  He had 136 small LDL particles was normal.  Total cholesterol was 127, HDL 55, triglycerides 57, and LDL 61.  Apo lipoprotein B was 54 and LPa was 66.  In May 2018  developed a new onset atrial flutter.  He was hospitalized; TEE showed mild dilation of his atrium without thrombus.  He underwent atrial flutter ablation by [name] and was found to have isthmus dependent right atrial flutter.  He was started on eliquis doing his presentation.  He saw [name] in follow-up on 03/07/2017 and eliquis was discontinued at that time and  he was told to resume aspirin.  He was questioning whether or not to take Plavix again.  He also had an ACE-induced cough and lisinopril was discontinued and he was started on low-dose losartan.    When I saw him in March 2019 , he was doing well and was without recurrent anginal symptoms or any recurrent atrial arrhythmias.  He continues have problems with his neuropathy  Because of the neuropathy he was walking approximately 2-3 miles per day instead of 3-5 miles as he had in the past.  He  continued to use Lyrica and Nucynta.  He was now back on Plavix and aspirin particular with his left main stent.  He is followed by [name] who checks his laboratory.  Most recent labs from 11/28/17 : TC 125, triglycerides 40, HDL 60, and LDL 50.  He has continued to be on Crestor, lovaza, and niacin with remote very low HDL levels and slightly icreased LP(a).  Prior to seeing him at his last office visit in June 2019 he  began to notice some left arm discomfort which was not clearly exertionally precipitated.  He describes it as feeling like he had just gotten a tetanus shot.  He denied any associated chest pressure.  However, when he walked up steps he noticed some mild increased shortness of breath.  He ultimately presented to urgent care where ECG was done.  With his CAD he was advised to go to the emergency room.  In the emergency room laboratory was drawn.  Troponins were negative.  He waited 9 hours but was never seen and ultimately left.  He called the office and was added onto my schedule.  In light of his recent chest pain with some atypical features I scheduled him for a nuclear perfusion study which was done on March 13, 2018.  Ejection fraction was 55%.  He did not develop any chest pain or ECG changes.  He had normal perfusion without evidence for scar or ischemia.  He underwent recent laboratory which has shown a cholesterol 125, triglycerides 53, HDL 55, LDL 59 on his current medical regimen.  LP(a) was 62.9.   I last saw him in September 2019 that time he had issues with both of his ARB therapy and in its place I started him on olmesartan 20 mg daily.  Since I saw him, he was seen on several occasions in the office [name] and underwent EP cardiac catheterization on September 29, 2020 he had presented with fatigue Gres of chest pain commencing on September 26, 2018.  Currently he was only taking 1/2 tablet of Plavix 75 mg prior to presentation due to easy bruisability.  He underwent repeat  catheterization by [name] which revealed overall stable native coronary and graft disease out significant change from his 2013 cardiac catheterization.  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  The previously placed ostial left main to distal left main stent was widely patent old occlusion of his circumflex marginal and vein graft was marginal vessel.  Since hospital discharge he denies any recurrent episodes of chest pain.  However, at times he still experiences episodic mild shortness of breath.  Nuys PND orthopnea.  He is unaware of any recurrent atrial fib or flutter.  Past he had significant mixed hyperlipidemia and has continued to stay on simvastatin 40 mg, 2 capsules twice a day niacin.  In the past we discussed discontinuance of niacin and he preferred to stay on this and has done well.  He presents for reevaluation.  Past Surgical History:  Procedure Laterality Date  . A-FLUTTER ABLATION N/A 02/05/2017   Procedure: A-Flutter Ablation;  Surgeon: Thompson Grayer, MD;  Location: [place] [facility];  Service: Cardiovascular;  Laterality: N/A;  . CARDIAC CATHETERIZATION  12/13/2011   No intervention - recommend medical therapy with increased medical trial  . CARDIOVASCULAR STRESS TEST  10/26/2010   No scintigraphic evidence of inducible myocardial ischemia. No ECG changes. EKG negative for ischemia.  . CORONARY ARTERY BYPASS GRAFT  1997   LIMA-LAD, vein graft to diagonal, vein graft to obtuse marginal, and vein graft to RCA  . LEFT HEART CATH AND CORS/GRAFTS ANGIOGRAPHY N/A 09/29/2018   Procedure: LEFT HEART CATH AND CORS/GRAFTS ANGIOGRAPHY;  Surgeon: Leonie Man, MD;  Location: [place] [facility];  Service: Cardiovascular;  Laterality: N/A;  . LEFT HEART CATHETERIZATION WITH CORONARY/GRAFT ANGIOGRAM N/A 12/13/2011   Procedure: LEFT HEART CATHETERIZATION WITH Beatrix Fetters;  Surgeon: Troy Sine, MD;  Location: [facility] CATH LAB;  Service: Cardiovascular;  Laterality: N/A;  . LOWER  EXTREMITY ARTERIAL DOPPLER  01/30/2000   Normal arterial study  . TEE WITHOUT CARDIOVERSION N/A 02/04/2017   Procedure: TRANSESOPHAGEAL ECHOCARDIOGRAM (TEE);  Surgeon: Dorothy Spark, MD;  Location: [facility] ENDOSCOPY;  Service: Cardiovascular;  Laterality: N/A;  . TRANSTHORACIC ECHOCARDIOGRAM  12/12/2011   EF >55%, LA severely dilated.  Marland Kitchen ULTRASOUND GUIDANCE FOR VASCULAR ACCESS  09/29/2018   Procedure: Ultrasound Guidance For Vascular Access;  Surgeon: Leonie Man, MD;  Location: [place] [facility];  Service: Cardiovascular;;    Allergies  Allergen Reactions  . Nortriptyline Other (See Comments)    Groggy at low dose  . Cymbalta [Duloxetine Hcl] Diarrhea    Current Outpatient Medications  Medication Sig Dispense Refill  . Ascorbic Acid (VITAMIN C PO) Take 1 tablet by mouth daily.    Marland Kitchen aspirin EC 81 MG tablet Take 1 tablet (81 mg total) by mouth daily. 90 tablet 3  . carvedilol (COREG) 6.25 MG tablet TAKE 1 TABLET(6.25 MG) BY MOUTH TWICE DAILY WITH A MEAL 180 tablet 3  . Cholecalciferol (VITAMIN D3) 5000 units CAPS Take 5,000-10,000 Units by mouth See admin instructions. Take 5,000 units by mouth once a day and rotate with 10,000 units every other day    . clopidogrel (PLAVIX) 75 MG tablet Take 1 tablet (75 mg total) by mouth daily. 90 tablet 3  . Coenzyme Q10 (CO Q-10 PO) Take 1 capsule by mouth daily.    . Ferrous Gluconate-C-Folic Acid (IRON-C PO) Take 1 capsule by mouth 2 (two) times a week.    . isosorbide mononitrate (IMDUR) 60 MG 24 hr tablet Take 1 tablet (60 mg total) by mouth daily. 90 tablet 3  . L-Methylfolate-B6-B12 (METANX PO) Take 1 tablet by mouth daily.     Marland Kitchen losartan (COZAAR) 25 MG tablet Take 1 tablet (25 mg total) by mouth daily. 90 tablet 3  . niacin (NIASPAN) 1000 MG CR tablet TAKE 1 TABLET(1000 MG) BY MOUTH AT BEDTIME 90 tablet 1  . nitroGLYCERIN (NITROLINGUAL) 0.4 MG/SPRAY spray Place 1 spray under the tongue every 5 (five) minutes x 3 doses as needed for chest  pain. 12 g 11  . NUCYNTA ER 200 MG TB12 Take 200 mg by mouth 2 (two) times daily.  0  . omega-3 acid ethyl esters (LOVAZA) 1 g capsule Take 2 capsules (2 g total) by mouth daily. 180 capsule 3  . OVER THE COUNTER MEDICATION Take 5-6 drops by mouth  See admin instructions. CBD oil for pain relief    . OVER THE COUNTER MEDICATION Take 5 mLs by mouth See admin instructions. Take 5 ml Tumeric by mouth as need for acid    . oxyCODONE-acetaminophen (PERCOCET/ROXICET) 5-325 MG tablet Take 1 tablet by mouth daily as needed for moderate pain.     . pioglitazone (ACTOS) 45 MG tablet Take 45 mg by mouth daily.    . pregabalin (LYRICA) 200 MG capsule Take 200 mg by mouth 3 (three) times daily.     . Quercetin 250 MG TABS Take 500 mg by mouth 2 (two) times daily.    . ranolazine (RANEXA) 1000 MG SR tablet TAKE 1 TABLET BY MOUTH TWICE DAILY. GENERIC EQUIVALENT FOR RANEXA. 180 tablet 1  . saxagliptin HCl (ONGLYZA) 5 MG TABS tablet Take 5 mg by mouth daily.    . vitamin E (VITAMIN E) 400 UNIT capsule Take 400 Units by mouth daily.    . rosuvastatin (CRESTOR) 40 MG tablet TAKE 1 TABLET(40 MG) BY MOUTH DAILY 90 tablet 1  . spironolactone (ALDACTONE) 25 MG tablet Take 0.5 tablets (12.5 mg total) by mouth daily. 90 tablet 0   No current facility-administered medications for this visit.     Social History   Socioeconomic History  . Marital status: Married    Spouse name: Not on file  . Number of children: Not on file  . Years of education: Not on file  . Highest education level: Not on file  Occupational History  . Not on file  Social Needs  . Financial resource strain: Not on file  . Food insecurity:    Worry: Not on file    Inability: Not on file  . Transportation needs:    Medical: Not on file    Non-medical: Not on file  Tobacco Use  . Smoking status: Never Smoker  . Smokeless tobacco: Never Used  Substance and Sexual Activity  . Alcohol use: Yes  . Drug use: Yes  . Sexual activity: Not on  file  Lifestyle  . Physical activity:    Days per week: Not on file    Minutes per session: Not on file  . Stress: Not on file  Relationships  . Social connections:    Talks on phone: Not on file    Gets together: Not on file    Attends religious service: Not on file    Active member of club or organization: Not on file    Attends meetings of clubs or organizations: Not on file    Relationship status: Not on file  . Intimate partner violence:    Fear of current or ex partner: Not on file    Emotionally abused: Not on file    Physically abused: Not on file    Forced sexual activity: Not on file  Other Topics Concern  . Not on file  Social History Narrative  . Not on file    Family History  Problem Relation Age of Onset  . Heart disease Father   . Diabetes Father   . Hyperlipidemia Sister    Socially he is married. His former wife  was Dory Larsen, an iinsurance agent. He has been married for several years with his present wife and has 2 stepchildren with her. He does exercise least 4-5 days per week. He has now retired. Previously he was an Optometrist for [employer].   ROS General: Negative; No fevers, chills, or night sweats;  HEENT: Negative; No changes in  vision or hearing, sinus congestion, difficulty swallowing Pulmonary: Negative; No cough, wheezing, shortness of breath, hemoptysis Cardiovascular: See HPI GI: Negative; No nausea, vomiting, diarrhea, or abdominal pain GU: Negative; No dysuria, hematuria, or difficulty voiding Musculoskeletal: Negative; no myalgias, joint pain, or weakness Hematologic/Oncology: Negative; no easy bruising, bleeding Endocrine: Positive for diabetes mellitus, type II; no heat/cold intolerance;  Neuro: Positive for peripheral neuropathy; no changes in balance, headaches Skin: Negative; No rashes or skin lesions Psychiatric: Negative; No behavioral problems, depression Sleep: Negative; No snoring, daytime sleepiness,  hypersomnolence, bruxism, restless legs, hypnogognic hallucinations, no cataplexy Other comprehensive 14 point system review is negative.  PE BP (!) 152/82   Pulse 62   Ht 5' 10"  (1.778 m)   Wt 217 lb (98.4 kg)   BMI 31.14 kg/m    Repeat blood pressure by me was 150/80  Wt Readings from Last 3 Encounters:  11/05/18 217 lb (98.4 kg)  10/15/18 214 lb 9.6 oz (97.3 kg)  09/30/18 215 lb 8 oz (97.8 kg)   General: Alert, oriented, no distress.  Skin: normal turgor, no rashes, warm and dry HEENT: Normocephalic, atraumatic. Pupils equal round and reactive to light; sclera anicteric; extraocular muscles intact;  Nose without nasal septal hypertrophy Mouth/Parynx benign; Mallinpatti scale 3 Neck: No JVD, no carotid bruits; normal carotid upstroke Lungs: clear to ausculatation and percussion; no wheezing or rales Chest wall: without tenderness to palpitation Heart: PMI not displaced, RRR, s1 s2 normal, 1/6 systolic murmur, no diastolic murmur, no rubs, gallops, thrills, or heaves Abdomen: soft, nontender; no hepatosplenomehaly, BS+; abdominal aorta nontender and not dilated by palpation. Back: no CVA tenderness Pulses 2+ Musculoskeletal: full range of motion, normal strength, no joint deformities Extremities: Trace ankle edema;no clubbing cyanosis, Homan's sign negative  Neurologic: grossly nonfocal; Cranial nerves grossly wnl Psychologic: Normal mood and affect   ECG (independently read by me): Sinus rhythm at 62 bpm with first-degree AV block..  Able to 48 ms.  No significant ST changes.  September 2019 ECG (independently read by me): Sinus bradycardia at 50; 1st degree AV block; PR 276 msec  June 2019 ECG (independently read by me): From March 04, 2018 at the emergency room/urgent care: Normal sinus rhythm at 60 bpm with first-degree AV block.  PR interval 258 ms.  Left axis deviation.  Poor R wave progression V1 through V3.  No acute abnormalities.  March 2019 ECG (independently read  by me): sinus bradycardia 54 bpm.  First degree AV block.  Nonspecific T waves.  PR interval 268 ms  September 2018 ECG (independently read by me): Sinus bradycardia at 55 bpm with first-degree AV block.  PR interval 264 ms.  Nonspecific T changes inferiorly.  November 2017 ECG (independently read by me): Sinus bradycardia at 59 bpm.  First degree AV block with a PR interval at 23 ms.  Incomplete right bundle branch block.  No significant ST segment changes.  September 2015 ECG (independently read by me): Sinus bradycardia 55 beats per minute.  First degree AV block with PR interval 258 ms.  Mild RV conduction delay.  Nonspecific T changes.  Prior 07/13/2013 ECG: Sinus bradycardia 54 beats per minute with first-degree AV block. Nonspecific T changes.  LABS: BMP Latest Ref Rng & Units 09/28/2018 09/27/2018 06/11/2018  Glucose 70 - 99 mg/dL 77 106(H) 122(H)  BUN 8 - 23 mg/dL 9 8 12   Creatinine 0.61 - 1.24 mg/dL 0.95 0.99 0.85  BUN/Creat Ratio 10 - 24 - - 14  Sodium 135 - 145 mmol/L  137 137 137  Potassium 3.5 - 5.1 mmol/L 3.8 3.9 3.9  Chloride 98 - 111 mmol/L 105 105 103  CO2 22 - 32 mmol/L 26 26 22   Calcium 8.9 - 10.3 mg/dL 8.8(L) 9.1 8.9   Hepatic Function Latest Ref Rng & Units 06/11/2018 03/04/2018 06/27/2017  Total Protein 6.0 - 8.5 g/dL 6.2 6.7 6.5  Albumin 3.5 - 4.8 g/dL 4.4 4.3 4.5  AST 0 - 40 IU/L 24 40 29  ALT 0 - 44 IU/L 20 25 24   Alk Phosphatase 39 - 117 IU/L 91 93 100  Total Bilirubin 0.0 - 1.2 mg/dL 0.9 1.3(H) 1.2   CBC Latest Ref Rng & Units 10/15/2018 09/30/2018 09/29/2018  WBC 3.4 - 10.8 x10E3/uL 4.2 4.7 4.5  Hemoglobin 13.0 - 17.7 g/dL 13.5 12.2(L) 12.5(L)  Hematocrit 37.5 - 51.0 % 40.4 35.7(L) 37.7(L)  Platelets 150 - 450 x10E3/uL 184 117(L) 123(L)   Lab Results  Component Value Date   MCV 95 10/15/2018   MCV 94.2 09/30/2018   MCV 93.8 09/29/2018   Lab Results  Component Value Date   TSH 1.870 06/11/2018   Lab Results  Component Value Date   HGBA1C 5.5 06/27/2017     Lipid Panel     Component Value Date/Time   CHOL 110 09/28/2018 0046   CHOL 125 06/11/2018 0819   CHOL 127 11/09/2015 0854   CHOL 107 02/12/2014 0801   TRIG 37 09/28/2018 0046   TRIG 57 11/09/2015 0854   TRIG 37 02/12/2014 0801   HDL 51 09/28/2018 0046   HDL 55 06/11/2018 0819   HDL 55 11/09/2015 0854   HDL 57 02/12/2014 0801   CHOLHDL 2.2 09/28/2018 0046   VLDL 7 09/28/2018 0046   LDLCALC 52 09/28/2018 0046   LDLCALC 59 06/11/2018 0819   LDLCALC 61 11/09/2015 0854   LDLCALC 43 02/12/2014 0801    RADIOLOGY: No results found.  IMPRESSION:  1. Essential hypertension   2. Coronary artery disease involving native coronary artery of native heart without angina pectoris   3. Hx of CABG   4. Medication management   5. Atrial flutter, unspecified type ([place]); counterclockwise isthmus dependent right atrial flutter status post radiofrequency ablation with complete bidirectional isthmus block achieved on 02/05/2017   6. Type 2 diabetes mellitus with diabetic neuropathy, without long-term current use of insulin ([place])   7. Mixed hyperlipidemia     ASSESSMENT AND PLAN: Mr. Romello Hoehn is a 73 year old white male who underwent CABG revascularization surgery in 1997 at age 19; and is status post initial stenting of the graft to the circumflex vessel in 2004. In December 2009 he underwent stenting of his left main into the superior ramus branch and stenting to the vein graft supplying the RCA.  At that evaluation the graft to the circumflex ws occluded  At catheterization in March 2013 the stent to the left main/ramus vessel was widely patent as was the stent to the vein graft to the right coronary artery. His proximal LAD was being supplied by his diagonal vessel and the mid LAD wassupplied by the LIMA graft.  His nuclear perfusion study was in March 2015 showed normal perfusion without scar or ischemia on his medical therapy.  He  developed atrial flutter in 2018 which was isthmus dependent  right atrial flutter and was successfully ablated.  He was on eliquis anticoagulation for one month. When I  saw him, he had been on aspirin alone and with his left main stent I also recommended resumption  of Plavix.  He has been stable without significant previous anginal symptoms on a medical regimen consisting of carvedilol 6.25 mg twice a day, isosorbide 30 mg, ranolazine 1000 mg twice a day in addition to his losartan.  Reviewed his subsequent evaluations since he had seen me in the office with ultimate hospitalization and repeat cardiac catheterization findings of January 2020.  His catheterization essentially is unchanged from 2013.  The left main stent is widely patent.  His blood pressure today is increased on his regimen consisting of carvedilol 6.25 mg twice a day, isosorbide 60 mg, losartan 25 mg.  He has noticed mild ankle edema as well as mild shortness of breath.  I have recommended the addition of spironolactone 12.5 mg to take for the first 2 weeks and if he tolerates this well titrate this to 12.5 mg twice a day.  This will be helpful with diastolic dysfunction.  He continues to be on rosuvastatin 40 mg, Lovaza 2 capsules twice a day in addition to niacin 100 mg.  Most recent lipid studies are excellent with total cholesterol 110, HDL 51, LDL 52, and triglycerides 37.  Discussed possible discontinuance of niacin but he prefers to stay on this and has done well.  He is diabetic on Onglyza.  He continues to be on ranolazine not having any anginal symptomatology.   Time spent: 25 minutes Troy Sine, MD, [facility]  11/07/2018 1:34 PM

## 2018-11-05 NOTE — Patient Instructions (Signed)
Medication Instructions:  Start Spironolactone 12.5 mg will increase based on lab work in 2 weeks. If you need a refill on your cardiac medications before your next appointment, please call your pharmacy.   Lab work: BMET in 2 weeks If you have labs (blood work) drawn today and your tests are completely normal, you will receive your results only by: Marland Kitchen MyChart Message (if you have MyChart) OR . A paper copy in the mail If you have any lab test that is abnormal or we need to change your treatment, we will call you to review the results.   Follow-Up: At [facility], you and your health needs are our priority.  As part of our continuing mission to provide you with exceptional heart care, we have created designated Provider Care Teams.  These Care Teams include your primary Cardiologist (physician) and Advanced Practice Providers (APPs -  Physician Assistants and Nurse Practitioners) who all work together to provide you with the care you need, when you need it. You will need a follow up appointment in 3 months.  You may see Shelva Majestic, MD or one of the following Advanced Practice Providers on your designated Care Team: [place], [place] . Fabian Sharp, PA-C

## 2018-11-06 ENCOUNTER — Other Ambulatory Visit: Payer: Self-pay | Admitting: Cardiovascular Disease

## 2018-11-07 ENCOUNTER — Encounter: Payer: Self-pay | Admitting: Cardiovascular Disease

## 2018-11-14 DIAGNOSIS — M9901 Segmental and somatic dysfunction of cervical region: Secondary | ICD-10-CM | POA: Diagnosis not present

## 2018-11-14 DIAGNOSIS — M9903 Segmental and somatic dysfunction of lumbar region: Secondary | ICD-10-CM | POA: Diagnosis not present

## 2018-11-14 DIAGNOSIS — M5136 Other intervertebral disc degeneration, lumbar region: Secondary | ICD-10-CM | POA: Diagnosis not present

## 2018-11-14 DIAGNOSIS — M9902 Segmental and somatic dysfunction of thoracic region: Secondary | ICD-10-CM | POA: Diagnosis not present

## 2018-11-14 DIAGNOSIS — M9904 Segmental and somatic dysfunction of sacral region: Secondary | ICD-10-CM | POA: Diagnosis not present

## 2018-11-14 DIAGNOSIS — M5030 Other cervical disc degeneration, unspecified cervical region: Secondary | ICD-10-CM | POA: Diagnosis not present

## 2018-11-20 DIAGNOSIS — I10 Essential (primary) hypertension: Secondary | ICD-10-CM | POA: Diagnosis not present

## 2018-11-20 DIAGNOSIS — Z79899 Other long term (current) drug therapy: Secondary | ICD-10-CM | POA: Diagnosis not present

## 2018-11-20 LAB — BASIC METABOLIC PANEL
BUN / CREAT RATIO: 12 (ref 10–24)
BUN: 11 mg/dL (ref 8–27)
CO2: 29 mmol/L (ref 20–29)
Calcium: 9.5 mg/dL (ref 8.6–10.2)
Chloride: 100 mmol/L (ref 96–106)
Creatinine, Ser: 0.92 mg/dL (ref 0.76–1.27)
GFR calc Af Amer: 96 mL/min/{1.73_m2} (ref >59)
GFR calc non Af Amer: 83 mL/min/{1.73_m2} (ref >59)
Glucose: 125 mg/dL — ABNORMAL HIGH (ref 65–99)
Potassium: 5 mmol/L (ref 3.5–5.2)
Sodium: 135 mmol/L (ref 134–144)

## 2018-12-01 DIAGNOSIS — E559 Vitamin D deficiency, unspecified: Secondary | ICD-10-CM | POA: Diagnosis not present

## 2018-12-01 DIAGNOSIS — E782 Mixed hyperlipidemia: Secondary | ICD-10-CM | POA: Diagnosis not present

## 2018-12-01 DIAGNOSIS — E1159 Type 2 diabetes mellitus with other circulatory complications: Secondary | ICD-10-CM | POA: Diagnosis not present

## 2018-12-01 DIAGNOSIS — G609 Hereditary and idiopathic neuropathy, unspecified: Secondary | ICD-10-CM | POA: Diagnosis not present

## 2018-12-04 DIAGNOSIS — Z955 Presence of coronary angioplasty implant and graft: Secondary | ICD-10-CM | POA: Diagnosis not present

## 2018-12-04 DIAGNOSIS — Z951 Presence of aortocoronary bypass graft: Secondary | ICD-10-CM | POA: Diagnosis not present

## 2018-12-04 DIAGNOSIS — I2510 Atherosclerotic heart disease of native coronary artery without angina pectoris: Secondary | ICD-10-CM | POA: Diagnosis not present

## 2018-12-04 DIAGNOSIS — G609 Hereditary and idiopathic neuropathy, unspecified: Secondary | ICD-10-CM | POA: Diagnosis not present

## 2018-12-04 DIAGNOSIS — E782 Mixed hyperlipidemia: Secondary | ICD-10-CM | POA: Diagnosis not present

## 2018-12-04 DIAGNOSIS — E1159 Type 2 diabetes mellitus with other circulatory complications: Secondary | ICD-10-CM | POA: Diagnosis not present

## 2018-12-04 DIAGNOSIS — E559 Vitamin D deficiency, unspecified: Secondary | ICD-10-CM | POA: Diagnosis not present

## 2018-12-04 DIAGNOSIS — I10 Essential (primary) hypertension: Secondary | ICD-10-CM | POA: Diagnosis not present

## 2019-01-14 DIAGNOSIS — G894 Chronic pain syndrome: Secondary | ICD-10-CM | POA: Diagnosis not present

## 2019-01-14 DIAGNOSIS — Z79891 Long term (current) use of opiate analgesic: Secondary | ICD-10-CM | POA: Diagnosis not present

## 2019-01-14 DIAGNOSIS — G609 Hereditary and idiopathic neuropathy, unspecified: Secondary | ICD-10-CM | POA: Diagnosis not present

## 2019-01-14 DIAGNOSIS — E1142 Type 2 diabetes mellitus with diabetic polyneuropathy: Secondary | ICD-10-CM | POA: Diagnosis not present

## 2019-01-14 DIAGNOSIS — M79671 Pain in right foot: Secondary | ICD-10-CM | POA: Diagnosis not present

## 2019-02-10 ENCOUNTER — Telehealth: Payer: Self-pay | Admitting: Cardiovascular Disease

## 2019-02-11 NOTE — Progress Notes (Signed)
Virtual Visit via Video Note   This visit type was conducted due to national recommendations for restrictions regarding the COVID-19 Pandemic (e.g. social distancing) in an effort to limit this patient's exposure and mitigate transmission in our community.  Due to his co-morbid illnesses, this patient is at least at moderate risk for complications without adequate follow up.  This format is felt to be most appropriate for this patient at this time.  All issues noted in this document were discussed and addressed.  A limited physical exam was performed with this format.  Please refer to the patient's chart for his consent to telehealth for [facility].   Date:  02/12/2019   ID:  Ruben [name], DOB 07-28-1946, MRN [id number]  Patient Location: Home Provider Location: Home  PCP:  Lujean Amel, MD  Cardiologist:  Shelva Majestic, MD  Electrophysiologist:  None   Evaluation Performed:  Follow-Up Visit  Chief Complaint:  3 month F/U  History of Present Illness:    Ruben [name] is a 73 y.o. male who has established CAD and in 1997 underwent CABG surgery with a LIMA to the LAD, vein to the diagonal, vein to the obtuse marginal, and vein to the right coronary artery. In November 2004 he underwent stenting to the vein graft to the circumflex vessel. In December 2009 the graft to the circumflex was occluded and he underwent stenting of the left main into the superior ramus intermediate vessel. At that time he also underwent stenting of the vein graft to the right coronary artery. His last catheterization which was done for repeat chest pain in March 2013 showed patent stents. He has known occlusion of the graft that supplied the circumflex marginal vessel. The graft to diagonal vessel was patent and filled the proximal LAD system and the LIMA graft which filled the mid LAD system was widely patent.  He has ahistory of mixed hyperlipidemia and has been on aggressive lipid-lowering therapy over  the past 18 years. He also has developed a peripheral neuropathy. He has mild diabetes mellitus and is now followed by [name]. In April 2014 which labs showed a total cholesterol was 125, triglycerides 52, HDL 55, LDL 52 on aggressive combination therapy consisting of Niaspan 2 g daily, Crestor 40 mg daily, Lovaza 2 g daily.  Initially, we had followed him with [facility] heart lab assessments.  On 12/08/2013 a followup nuclear perfusion study continued to show normal perfusion without scar or ischemia on his current medical therapy.  Ejection fraction was 64%.  He is retired for several years and with more time he exercises regularly and walks up to 3 -5 miles per day approximately 4-5 days per week and does yoga 3 days per week.  He denies any recurrent anginal symptoms.  He denies any shortness of breath.  He denies PND, orthopnea.  He denies palpitations.   Laboratory in May 2015 showed significant aggressive lipid management with an LDL particle number now at 371 and calculated LDL 43, HDL 57, triglycerides 37, total cholesterol 107.  Small LDL particles of 156.  He still had an increased VLDL size.  Insulin resistance score was less than 25.  At that time, I recommended he reduce his Niaspan from 2000 mg to 1000 mg a discussed the possibility of discontinuing this altogether in the future depending upon subsequent levels.  In March 2017 he underwent a repeat echo Doppler study which showed mild LVH with normal systolic function without wall motion abnormalities.  There was  very mild aortic regurgitation.  His aortic root dimension measured 39 mm.  His left atrium was mildly dilated.  Pulmonary pressures were normal.  He also underwent a repeat lipoprofile which showed an LDL particle #856.  He had 136 small LDL particles was normal.  Total cholesterol was 127, HDL 55, triglycerides 57, and LDL 61.  Apo lipoprotein B was 54 and LPa was 66.  In May 2018  developed a new onset atrial flutter.  He  was hospitalized; TEE showed mild dilation of his atrium without thrombus.  He underwent atrial flutter ablation by [name] and was found to have isthmus dependent right atrial flutter.  He was started on eliquis doing his presentation.  He saw [name] in follow-up on 03/07/2017 and eliquis was discontinued at that time and he was told to resume aspirin.  He was questioning whether or not to take Plavix again.  He also had an ACE-induced cough and lisinopril was discontinued and he was started on low-dose losartan.    When I saw him in March 2019 , he was doing well and was without recurrent anginal symptoms or any recurrent atrial arrhythmias.  He continues have problems with his neuropathy  Because of the neuropathy he was walking approximately 2-3 miles per day instead of 3-5 miles as he had in the past.  He continued to use Lyrica and Nucynta.  He was now back on Plavix and aspirin particular with his left main stent.  He is followed by [name] who checks his laboratory.  Most recent labs from 11/28/17 : TC 125, triglycerides 40, HDL 60, and LDL 50.  He has continued to be on Crestor, lovaza, and niacin with remote very low HDL levels and slightly icreased LP(a).  Prior to seeing him at his last office visit in June 2019 he  began to notice some left arm discomfort which was not clearly exertionally precipitated.  He describes it as feeling like he had just gotten a tetanus shot.  He denied any associated chest pressure.  However, when he walked up steps he noticed some mild increased shortness of breath.  He ultimately presented to urgent care where ECG was done.  With his CAD he was advised to go to the emergency room.  In the emergency room laboratory was drawn.  Troponins were negative.  He waited 9 hours but was never seen and ultimately left.  He called the office and was added onto my schedule.  In light of his recent chest pain with some atypical features I scheduled him for a nuclear  perfusion study which was done on March 13, 2018.  Ejection fraction was 55%.  He did not develop any chest pain or ECG changes.  He had normal perfusion without evidence for scar or ischemia.  He underwent recent laboratory which has shown a cholesterol 125, triglycerides 53, HDL 55, LDL 59 on his current medical regimen.  LP(a) was 62.9.   I  saw him in September 2019 that time he had issues with both of his ARB therapy and in its place I started him on olmesartan 20 mg daily.  Since I saw him, he was seen on several occasions in the office [name] and underwent  cardiac catheterization on September 29, 2020 after he had presented with fatigue and chest pain commencing on September 26, 2018.  Currently he was only taking 1/2 tablet of Plavix 75 mg prior to presentation due to easy bruisability.  He underwent repeat catheterization by  [name] which revealed overall stable native coronary and graft disease out significant change from his 2013 cardiac catheterization.  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  The previously placed ostial left main to distal left main stent was widely patent old occlusion of his circumflex marginal and vein graft was marginal vessel.  Since hospital discharge he denies any recurrent episodes of chest pain.  However, at times he still experiences episodic mild shortness of breath. He denies PND orthopnea.  He is unaware of any recurrent atrial fib or flutter.  Past he had significant mixed hyperlipidemia and has continued to stay on simvastatin 40 mg, lovaza 2 capsules twice a day and niacin.  In the past we discussed discontinuance of niacin and he preferred to stay on this and has done well.  When I saw him in office follow-up in February 2020 he was mildly hypertensive and with his echo documentation of grade 2 diastolic dysfunction I recommended the addition of low-dose spironolactone at 12.5 mg daily.  He has tolerated this well.  He states his blood pressure typically  runs in the 110 to with less than 962 systolically.  He saw Dr. Elyse Hsu on December 04, 2018.  Repeat laboratory showed a total cholesterol 123, triglycerides 70, HDL 52, LDL 60.  He denies any recurrent episodes of chest tightness.  He exercises daily and basically exercises for at least 180 minutes/week typically on the treadmill.  He usually walks at  2.8 mph and 4% grade.  He denies any shortness of breath with the exception of one occasion when he was walking up a steep hill but this ultimately subsided.  He continues to be on Onglyza and p.o. glitazone for his diabetes mellitus followed closely by Dr. all-time her.  When I reviewed Dr. Alzheimer's laboratory his creatinine was 1.50, K 5.3.  He continues to be on Lyrica and Nucynta  for his peripheral neuropathy.  He presents for evaluation.  The patient does not have symptoms concerning for COVID-19 infection (fever, chills, cough, or new shortness of breath).    Past Medical History:  Diagnosis Date   Atrial flutter ([place]) 02/02/2017   CAD, multiple vessel    Diabetes mellitus ([place])    Dyslipidemia    Hypertension    Past Surgical History:  Procedure Laterality Date   A-FLUTTER ABLATION N/A 02/05/2017   Procedure: A-Flutter Ablation;  Surgeon: Thompson Grayer, MD;  Location: [place] [facility];  Service: Cardiovascular;  Laterality: N/A;   CARDIAC CATHETERIZATION  12/13/2011   No intervention - recommend medical therapy with increased medical trial   CARDIOVASCULAR STRESS TEST  10/26/2010   No scintigraphic evidence of inducible myocardial ischemia. No ECG changes. EKG negative for ischemia.   CORONARY ARTERY BYPASS GRAFT  1997   LIMA-LAD, vein graft to diagonal, vein graft to obtuse marginal, and vein graft to RCA   LEFT HEART CATH AND CORS/GRAFTS ANGIOGRAPHY N/A 09/29/2018   Procedure: LEFT HEART CATH AND CORS/GRAFTS ANGIOGRAPHY;  Surgeon: Leonie Man, MD;  Location: [place] [facility];  Service: Cardiovascular;  Laterality:  N/A;   LEFT HEART CATHETERIZATION WITH CORONARY/GRAFT ANGIOGRAM N/A 12/13/2011   Procedure: LEFT HEART CATHETERIZATION WITH Beatrix Fetters;  Surgeon: Troy Sine, MD;  Location: [facility] C[facility];  Service: Cardiovascular;  Laterality: N/A;   LOWER EXTREMITY ARTERIAL DOPPLER  01/30/2000   Normal arterial study   TEE WITHOUT CARDIOVERSION N/A 02/04/2017   Procedure: TRANSESOPHAGEAL ECHOCARDIOGRAM (TEE);  Surgeon: Dorothy Spark, MD;  Location:  [facility];  Service: Cardiovascular;  Laterality: N/A;   TRANSTHORACIC ECHOCARDIOGRAM  12/12/2011   EF >55%, LA severely dilated.   ULTRASOUND GUIDANCE FOR VASCULAR ACCESS  09/29/2018   Procedure: Ultrasound Guidance For Vascular Access;  Surgeon: Leonie Man, MD;  Location: [place] [facility];  Service: Cardiovascular;;     Current Meds  Medication Sig   Ascorbic Acid (VITAMIN C PO) Take 1 tablet by mouth daily.   aspirin EC 81 MG tablet Take 1 tablet (81 mg total) by mouth daily.   carvedilol (COREG) 6.25 MG tablet TAKE 1 TABLET(6.25 MG) BY MOUTH TWICE DAILY WITH A MEAL   Cholecalciferol (VITAMIN D3) 5000 units CAPS Take 5,000-10,000 Units by mouth See admin instructions. Take 5,000 units by mouth once a day and rotate with 10,000 units every other day   clopidogrel (PLAVIX) 75 MG tablet Take 1 tablet (75 mg total) by mouth daily.   Coenzyme Q10 (CO Q-10 PO) Take 1 capsule by mouth daily.   Ferrous Gluconate-C-Folic Acid (IRON-C PO) Take 1 capsule by mouth 2 (two) times a week.   isosorbide mononitrate (IMDUR) 60 MG 24 hr tablet Take 1 tablet (60 mg total) by mouth daily.   L-Methylfolate-B6-B12 (METANX PO) Take 1 tablet by mouth daily.    losartan (COZAAR) 25 MG tablet Take 1 tablet (25 mg total) by mouth daily.   niacin (NIASPAN) 1000 MG CR tablet TAKE 1 TABLET(1000 MG) BY MOUTH AT BEDTIME   nitroGLYCERIN (NITROLINGUAL) 0.4 MG/SPRAY spray Place 1 spray under the tongue every 5 (five) minutes x 3 doses as needed for  chest pain.   NUCYNTA ER 200 MG TB12 Take 200 mg by mouth 2 (two) times daily.   omega-3 acid ethyl esters (LOVAZA) 1 g capsule Take 2 capsules (2 g total) by mouth daily.   OVER THE COUNTER MEDICATION Take 5-6 drops by mouth See admin instructions. CBD oil for pain relief   OVER THE COUNTER MEDICATION Take 5 mLs by mouth See admin instructions. Take 5 ml Tumeric by mouth as need for acid   oxyCODONE-acetaminophen (PERCOCET/ROXICET) 5-325 MG tablet Take 1 tablet by mouth daily as needed for moderate pain.    pioglitazone (ACTOS) 45 MG tablet Take 45 mg by mouth daily.   pregabalin (LYRICA) 200 MG capsule Take 200 mg by mouth 3 (three) times daily.    Quercetin 250 MG TABS Take 500 mg by mouth 2 (two) times daily.   ranolazine (RANEXA) 1000 MG SR tablet TAKE 1 TABLET BY MOUTH TWICE DAILY. GENERIC EQUIVALENT FOR RANEXA.   rosuvastatin (CRESTOR) 40 MG tablet TAKE 1 TABLET(40 MG) BY MOUTH DAILY   saxagliptin HCl (ONGLYZA) 5 MG TABS tablet Take 5 mg by mouth daily.   spironolactone (ALDACTONE) 25 MG tablet Take 0.5 tablets (12.5 mg total) by mouth daily.   vitamin E (VITAMIN E) 400 UNIT capsule Take 400 Units by mouth daily.     Allergies:   Nortriptyline and Cymbalta [duloxetine hcl]   Social History   Tobacco Use   Smoking status: Never Smoker   Smokeless tobacco: Never Used  Substance Use Topics   Alcohol use: Yes   Drug use: Yes     Family Hx: The patient's family history includes Diabetes in his father; Heart disease in his father; Hyperlipidemia in his sister.  ROS:   Please see the history of present illness.    No fevers chills or night sweats No change in vision or hearing No wheezing or chest congestion No recurrent  anginal symptoms Rare exertional dyspnea with significant uphill walking No abdominal discomfort or bleeding Positive for diabetes mellitus Positive peripheral neuropathy No bleeding Sleeping well  All other systems reviewed and are  negative.   Prior CV studies:   The following studies were reviewed today:  LEFT HEART CATH AND CORS/GRAFTS ANGIOGRAPHY1/6/20  Conclusion     Native Vessels. Stable.  Prox RCA to Mid RCA stent is 35% stenosed. Mid RCA to Dist RCA stent is 40% stenosed. Dist RCA lesion is 55% stenosed with 65% stenosed side branch in Ost RPDA.  Ost LAD to Prox LAD lesion is 100% stenosed. Prox LAD lesion is 90% stenosed. Prox LAD to Mid LAD lesion is 100% stenosed.  Previously placed Ost LM to Dist LM stent (unknown type) is widely patent with 0% stenosed side branch in Ost Ramus. Lat Ramus lesion is 30% stenosed.  Prox Cx to Mid Cx lesion is 100% stenosed. Ost 1st Mrg lesion is 100% stenosed.  ------------------  GRAFTS - Stable  LIMA-mLAD graft was not injected and is large. The graft exhibits a focal 40% lesion in a kink otherwise no disease. With normal antegrade flow  SVG-OM known to be 100% CTO.  SVG-1st Diag graft was visualized by angiography and is large. The graft exhibits no disease.  ------------------  SVG-rPDA graft was not injected and is large. The graft exhibits no disease. Previously placed Mid Graft to Dist Graft stent (unknown type) is widely patent.  LV end diastolic pressure is mildly elevated.  SUMMARY  Overall stable native coronary and graft disease but no significant changes from 2013 cardiac catheterization  Known CTO of ostial LAD, proximal circumflex-OM and SVG-OM.  Widely patent LIMA-LAD (with focal area of 40% stenosis at a kink), SVG-1stDiag and SVG-RPDA (with patent stent)  Mildly elevated LVEDP  RECOMMENDATIONS  Continue medical management. Will increase Imdur to 60 mg.  Anticipate that he should be able to discharge later on today after bedrest.     Echo 09/29/18 Study Conclusions  - Left ventricle: The cavity size was normal. Wall thickness was increased in a pattern of moderate LVH. Systolic function was normal. The estimated  ejection fraction was in the range of 60% to 65%. Wall motion was normal; there were no regional wall motion abnormalities. Features are consistent with a pseudonormal left ventricular filling pattern, with concomitant abnormal relaxation and increased filling pressure (grade 2 diastolic dysfunction). - Left atrium: The atrium was mildly dilated. - Right atrium: The atrium was mildly dilated.     Labs/Other Tests and Data Reviewed:    EKG:  An ECG dated 11/05/2018 was personally reviewed today and demonstrated:  Sinus rhythm at 62 bpm with first-degree AV block.  Recent Labs: 06/11/2018: ALT 20; TSH 1.870 10/15/2018: Hemoglobin 13.5; Platelets 184 11/20/2018: BUN 11; Creatinine, Ser 0.92; Potassium 5.0; Sodium 135   Recent Lipid Panel Lab Results  Component Value Date/Time   CHOL 110 09/28/2018 12:46 AM   CHOL 125 06/11/2018 08:19 AM   CHOL 127 11/09/2015 08:54 AM   CHOL 107 02/12/2014 08:01 AM   TRIG 37 09/28/2018 12:46 AM   TRIG 57 11/09/2015 08:54 AM   TRIG 37 02/12/2014 08:01 AM   HDL 51 09/28/2018 12:46 AM   HDL 55 06/11/2018 08:19 AM   HDL 55 11/09/2015 08:54 AM   HDL 57 02/12/2014 08:01 AM   CHOLHDL 2.2 09/28/2018 12:46 AM   LDLCALC 52 09/28/2018 12:46 AM   LDLCALC 59 06/11/2018 08:19 AM   LDLCALC 61 11/09/2015 08:54  AM   LDLCALC 43 02/12/2014 08:01 AM    Wt Readings from Last 3 Encounters:  02/12/19 212 lb (96.2 kg)  11/05/18 217 lb (98.4 kg)  10/15/18 214 lb 9.6 oz (97.3 kg)     Objective:    Vital Signs:  BP 113/67    Pulse 62    Ht 5\' 10"  (1.778 m)    Wt 212 lb (96.2 kg)    SpO2 93%    BMI 30.42 kg/m    He states his blood pressure typically runs between 110 and 120. He is well-developed and well-nourished in no acute distress HEENT is unchanged and unremarkable There is no apparent JVD Respirations are normal and not labored There is no audible wheezing His heart rhythm is regular by his palpation.  There is no chest wall tenderness  He  denies any abdominal tenderness There is no muscle tenderness There is no leg swelling He has previous documentation of peripheral neuropathy He denies any new neurologic symptoms He has normal cognition, mood and affect  ASSESSMENT & PLAN:    1. CAD/CABG revascularization: He underwent initial CABG revascularization surgery 1997 at age 50 with subsequent initial stenting of the graft to the circumflex vessel in 2004.  In December 2009 he underwent stenting of his left main into the superior ramus branch and stenting of the vein graft supplying the RCA.  At that evaluation the graft of the circumflex was occluded.  In March 2013 the stent to the left main/ramus vessel was widely patent as was the stent to the vein graft to the RCA. His LIMA graft to the mid LAD was patent.  His motion cardiac catheterization in January 2020 was again reviewed with the patient.  This did not show any major change from his previous evaluation.  He continues to do well on his echo regimen now consisting of ranolazine 1000 mg twice a day, isosorbide 60 mg daily, carvedilol 6.25 mg twice a day.  He is not having any anginal symptomatology.  He does admit to one episode of exertional dyspnea when he was walking up a steep hill.  He continues to take DAPT with aspirin/Plavix. 2. Essential hypertension: Blood pressure today is controlled on his regimen consisting of carvedilol 6.25 mg twice a day, losartan 25 mg, spironolactone which was added at his last office visit at 12.5 mg, in addition to his isosorbide.  BPs have consistently been in the 456-256 range systolically. 3. Grade 2 diastolic dysfunction: This seems to have somewhat improved with the addition of low-dose spironolactone 12.5 mg daily.  I will continue this current dose particularly since laboratory done by Dr. timer had shown an upper normal potassium as well as creatinine of 1.5.  I will repeat a echo Doppler study in January 2021 to reassess LV systolic and  diastolic function as well as valvular architecture. 4. History of atrial flutter, counterclockwise isthmus dependent right atrial flutter status post radiofrequency ablation with complete bidirectional isthmus block May 2018.  No recurrent symptoms.  He exercises regularly and typically has baseline mild bradycardia. 5. Mixed hyperlipidemia: Lipid studies were reviewed which had been obtained in March 2020 by Dr. all-time her.  Lipids are excellent on his medical regimen consisting of rosuvastatin 40 mg, Lovaza 2 capsules twice a day, and niacin. 6. 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 glitazone. 7. Peripheral neuropathy: On Lyrica and Nucynta ER  COVID-19 Education: The signs and  symptoms of COVID-19 were discussed with the patient and how to seek care for testing (follow up with PCP or arrange E-visit).  The importance of social distancing was discussed today.  Time:   Today, I have spent 27 minutes with the patient with telehealth technology discussing the above problems.     Medication Adjustments/Labs and Tests Ordered: Current medicines are reviewed at length with the patient today.  Concerns regarding medicines are outlined above.   Tests Ordered: No orders of the defined types were placed in this encounter.   Medication Changes: No orders of the defined types were placed in this encounter.   Disposition:  Follow up January 2021 and prior to that office visit he will undergo a 1 year follow-up echo Doppler evaluation  Signed, Shelva Majestic, MD  02/12/2019 11:09 AM    [place]

## 2019-02-12 ENCOUNTER — Encounter: Payer: Self-pay | Admitting: Cardiovascular Disease

## 2019-02-12 ENCOUNTER — Telehealth (INDEPENDENT_AMBULATORY_CARE_PROVIDER_SITE_OTHER): Payer: Medicare Other | Admitting: Cardiovascular Disease

## 2019-02-12 DIAGNOSIS — Z951 Presence of aortocoronary bypass graft: Secondary | ICD-10-CM

## 2019-02-12 DIAGNOSIS — G629 Polyneuropathy, unspecified: Secondary | ICD-10-CM | POA: Diagnosis not present

## 2019-02-12 DIAGNOSIS — I2510 Atherosclerotic heart disease of native coronary artery without angina pectoris: Secondary | ICD-10-CM | POA: Diagnosis not present

## 2019-02-12 DIAGNOSIS — E1140 Type 2 diabetes mellitus with diabetic neuropathy, unspecified: Secondary | ICD-10-CM | POA: Diagnosis not present

## 2019-02-12 DIAGNOSIS — I4892 Unspecified atrial flutter: Secondary | ICD-10-CM

## 2019-02-12 DIAGNOSIS — I10 Essential (primary) hypertension: Secondary | ICD-10-CM

## 2019-02-12 DIAGNOSIS — E782 Mixed hyperlipidemia: Secondary | ICD-10-CM | POA: Diagnosis not present

## 2019-02-12 DIAGNOSIS — I5032 Chronic diastolic (congestive) heart failure: Secondary | ICD-10-CM

## 2019-02-12 NOTE — Patient Instructions (Signed)
Medication Instructions:  The current medical regimen is effective;  continue present plan and medications.  If you need a refill on your cardiac medications before your next appointment, please call your pharmacy.   Testing/Procedures: Echocardiogram (in Jan) - Your physician has requested that you have an echocardiogram. Echocardiography is a painless test that uses sound waves to create images of your heart. It provides your doctor with information about the size and shape of your heart and how well your heart's chambers and valves are working. This procedure takes approximately one hour. There are no restrictions for this procedure. This will be performed at our [facility] location - [address].   Follow-Up: At [facility], you and your health needs are our priority.  As part of our continuing mission to provide you with exceptional heart care, we have created designated Provider Care Teams.  These Care Teams include your primary Cardiologist (physician) and Advanced Practice Providers (APPs -  Physician Assistants and Nurse Practitioners) who all work together to provide you with the care you need, when you need it. You will need a follow up appointment in 8 months.  Please call our office 2 months in advance to schedule this appointment.  You may see Shelva Majestic, MD or one of the following Advanced Practice Providers on your designated Care Team: [place], [place] . Fabian Sharp, PA-C

## 2019-03-24 NOTE — Telephone Encounter (Signed)
Opened in error

## 2019-03-30 DIAGNOSIS — G894 Chronic pain syndrome: Secondary | ICD-10-CM | POA: Diagnosis not present

## 2019-03-30 DIAGNOSIS — G609 Hereditary and idiopathic neuropathy, unspecified: Secondary | ICD-10-CM | POA: Diagnosis not present

## 2019-03-30 DIAGNOSIS — Z5181 Encounter for therapeutic drug level monitoring: Secondary | ICD-10-CM | POA: Diagnosis not present

## 2019-03-30 DIAGNOSIS — Z79899 Other long term (current) drug therapy: Secondary | ICD-10-CM | POA: Diagnosis not present

## 2019-04-09 DIAGNOSIS — M2042 Other hammer toe(s) (acquired), left foot: Secondary | ICD-10-CM | POA: Diagnosis not present

## 2019-04-09 DIAGNOSIS — M79672 Pain in left foot: Secondary | ICD-10-CM | POA: Diagnosis not present

## 2019-04-09 DIAGNOSIS — M79671 Pain in right foot: Secondary | ICD-10-CM | POA: Diagnosis not present

## 2019-04-09 DIAGNOSIS — E1140 Type 2 diabetes mellitus with diabetic neuropathy, unspecified: Secondary | ICD-10-CM | POA: Diagnosis not present

## 2019-04-19 ENCOUNTER — Other Ambulatory Visit: Payer: Self-pay | Admitting: Physician Assistant

## 2019-04-20 ENCOUNTER — Other Ambulatory Visit: Payer: Self-pay

## 2019-04-20 NOTE — Telephone Encounter (Signed)
Rx request sent to pharmacy.  

## 2019-04-22 ENCOUNTER — Other Ambulatory Visit: Payer: Self-pay | Admitting: Cardiovascular Disease

## 2019-04-22 NOTE — Telephone Encounter (Signed)
°*  STAT* If patient is at the pharmacy, call can be transferred to refill team.   1. Which medications need to be refilled? (please list name of each medication and dose if known)  Niacin 1000 mg, Spironolactone 25 mg  2. Which pharmacy/location (including street and city if local pharmacy) is medication to be sent to? [facility] [address]  3. Do they need a 30 day or 90 day supply? [place]

## 2019-04-23 MED ORDER — SPIRONOLACTONE 25 MG PO TABS: 12.5000 mg | ORAL_TABLET | Freq: Every day | ORAL | 2 refills | Status: DC

## 2019-04-24 MED ORDER — NIACIN ER (ANTIHYPERLIPIDEMIC) 1000 MG PO TBCR: EXTENDED_RELEASE_TABLET | ORAL | 2 refills | Status: DC

## 2019-04-24 NOTE — Telephone Encounter (Signed)
Niacin refilled today    spironolactone (ALDACTONE) 25 MG tablet 90 tablet 2 04/23/2019 07/22/2019   Sig - Route: Take 0.5 tablets (12.5 mg total) by mouth daily. - Oral   Sent to pharmacy as: spironolactone (ALDACTONE) 25 MG tablet   E-Prescribing Status: Receipt confirmed by pharmacy (04/23/2019 9:45 AM EDT)   [place], [address] AT [place]

## 2019-05-11 DIAGNOSIS — Z125 Encounter for screening for malignant neoplasm of prostate: Secondary | ICD-10-CM | POA: Diagnosis not present

## 2019-05-11 DIAGNOSIS — E119 Type 2 diabetes mellitus without complications: Secondary | ICD-10-CM | POA: Diagnosis not present

## 2019-05-11 DIAGNOSIS — E1142 Type 2 diabetes mellitus with diabetic polyneuropathy: Secondary | ICD-10-CM | POA: Diagnosis not present

## 2019-05-11 DIAGNOSIS — I2510 Atherosclerotic heart disease of native coronary artery without angina pectoris: Secondary | ICD-10-CM | POA: Diagnosis not present

## 2019-05-11 DIAGNOSIS — I10 Essential (primary) hypertension: Secondary | ICD-10-CM | POA: Diagnosis not present

## 2019-05-11 DIAGNOSIS — R868 Other abnormal findings in specimens from male genital organs: Secondary | ICD-10-CM | POA: Diagnosis not present

## 2019-05-11 DIAGNOSIS — E782 Mixed hyperlipidemia: Secondary | ICD-10-CM | POA: Diagnosis not present

## 2019-05-25 DIAGNOSIS — G894 Chronic pain syndrome: Secondary | ICD-10-CM | POA: Diagnosis not present

## 2019-05-25 DIAGNOSIS — G609 Hereditary and idiopathic neuropathy, unspecified: Secondary | ICD-10-CM | POA: Diagnosis not present

## 2019-05-25 DIAGNOSIS — Z79891 Long term (current) use of opiate analgesic: Secondary | ICD-10-CM | POA: Diagnosis not present

## 2019-05-25 DIAGNOSIS — M79671 Pain in right foot: Secondary | ICD-10-CM | POA: Diagnosis not present

## 2019-06-12 DIAGNOSIS — H25013 Cortical age-related cataract, bilateral: Secondary | ICD-10-CM | POA: Diagnosis not present

## 2019-06-12 DIAGNOSIS — H52203 Unspecified astigmatism, bilateral: Secondary | ICD-10-CM | POA: Diagnosis not present

## 2019-06-12 DIAGNOSIS — E119 Type 2 diabetes mellitus without complications: Secondary | ICD-10-CM | POA: Diagnosis not present

## 2019-06-12 DIAGNOSIS — H43813 Vitreous degeneration, bilateral: Secondary | ICD-10-CM | POA: Diagnosis not present

## 2019-06-24 DIAGNOSIS — M5136 Other intervertebral disc degeneration, lumbar region: Secondary | ICD-10-CM | POA: Diagnosis not present

## 2019-06-24 DIAGNOSIS — M9903 Segmental and somatic dysfunction of lumbar region: Secondary | ICD-10-CM | POA: Diagnosis not present

## 2019-06-24 DIAGNOSIS — M5030 Other cervical disc degeneration, unspecified cervical region: Secondary | ICD-10-CM | POA: Diagnosis not present

## 2019-06-24 DIAGNOSIS — M9902 Segmental and somatic dysfunction of thoracic region: Secondary | ICD-10-CM | POA: Diagnosis not present

## 2019-06-24 DIAGNOSIS — M9904 Segmental and somatic dysfunction of sacral region: Secondary | ICD-10-CM | POA: Diagnosis not present

## 2019-06-24 DIAGNOSIS — M9901 Segmental and somatic dysfunction of cervical region: Secondary | ICD-10-CM | POA: Diagnosis not present

## 2019-06-30 DIAGNOSIS — Z23 Encounter for immunization: Secondary | ICD-10-CM | POA: Diagnosis not present

## 2019-07-03 DIAGNOSIS — G609 Hereditary and idiopathic neuropathy, unspecified: Secondary | ICD-10-CM | POA: Diagnosis not present

## 2019-07-03 DIAGNOSIS — G894 Chronic pain syndrome: Secondary | ICD-10-CM | POA: Diagnosis not present

## 2019-08-05 ENCOUNTER — Other Ambulatory Visit: Payer: Self-pay | Admitting: Cardiovascular Disease

## 2019-08-05 MED ORDER — LOSARTAN POTASSIUM 25 MG PO TABS: 25.0000 mg | ORAL_TABLET | Freq: Every day | ORAL | 3 refills | Status: DC

## 2019-08-05 MED ORDER — CLOPIDOGREL BISULFATE 75 MG PO TABS: 75.0000 mg | ORAL_TABLET | Freq: Every day | ORAL | 3 refills | Status: DC

## 2019-08-05 MED ORDER — CARVEDILOL 6.25 MG PO TABS: ORAL_TABLET | ORAL | 3 refills | Status: DC

## 2019-08-05 MED ORDER — NIACIN ER (ANTIHYPERLIPIDEMIC) 1000 MG PO TBCR: EXTENDED_RELEASE_TABLET | ORAL | 2 refills | Status: DC

## 2019-08-05 NOTE — Telephone Encounter (Signed)
This is Dr. Kelly's pt. °

## 2019-08-07 ENCOUNTER — Other Ambulatory Visit: Payer: Self-pay

## 2019-08-07 MED ORDER — CLOPIDOGREL BISULFATE 75 MG PO TABS: 75.0000 mg | ORAL_TABLET | Freq: Every day | ORAL | 3 refills | Status: DC

## 2019-08-24 ENCOUNTER — Other Ambulatory Visit: Payer: Self-pay

## 2019-08-24 MED ORDER — RANOLAZINE ER 1000 MG PO TB12: ORAL_TABLET | ORAL | 1 refills | Status: DC

## 2019-08-24 NOTE — Telephone Encounter (Signed)
Refill for Ranexa sent to patients pharmacy.

## 2019-08-31 DIAGNOSIS — G609 Hereditary and idiopathic neuropathy, unspecified: Secondary | ICD-10-CM | POA: Diagnosis not present

## 2019-08-31 DIAGNOSIS — E1142 Type 2 diabetes mellitus with diabetic polyneuropathy: Secondary | ICD-10-CM | POA: Diagnosis not present

## 2019-08-31 DIAGNOSIS — Z79891 Long term (current) use of opiate analgesic: Secondary | ICD-10-CM | POA: Diagnosis not present

## 2019-08-31 DIAGNOSIS — G894 Chronic pain syndrome: Secondary | ICD-10-CM | POA: Diagnosis not present

## 2019-10-15 ENCOUNTER — Ambulatory Visit (HOSPITAL_COMMUNITY): Payer: Medicare Other | Attending: Internal Medicine

## 2019-10-15 ENCOUNTER — Other Ambulatory Visit: Payer: Self-pay

## 2019-10-15 DIAGNOSIS — Z951 Presence of aortocoronary bypass graft: Secondary | ICD-10-CM | POA: Insufficient documentation

## 2019-10-15 DIAGNOSIS — I10 Essential (primary) hypertension: Secondary | ICD-10-CM | POA: Insufficient documentation

## 2019-10-23 ENCOUNTER — Ambulatory Visit: Payer: Medicare Other

## 2019-10-27 DIAGNOSIS — M79641 Pain in right hand: Secondary | ICD-10-CM | POA: Diagnosis not present

## 2019-10-27 DIAGNOSIS — G894 Chronic pain syndrome: Secondary | ICD-10-CM | POA: Diagnosis not present

## 2019-10-27 DIAGNOSIS — M79671 Pain in right foot: Secondary | ICD-10-CM | POA: Diagnosis not present

## 2019-10-27 DIAGNOSIS — G609 Hereditary and idiopathic neuropathy, unspecified: Secondary | ICD-10-CM | POA: Diagnosis not present

## 2019-10-29 ENCOUNTER — Ambulatory Visit: Payer: Medicare Other | Attending: Internal Medicine

## 2019-10-29 DIAGNOSIS — Z23 Encounter for immunization: Secondary | ICD-10-CM | POA: Insufficient documentation

## 2019-10-29 NOTE — Progress Notes (Signed)
   Covid-19 Vaccination Clinic  Name:  Ruben [name]    MRN: [id number] DOB: 01-21-1946  10/29/2019  Mr. Kohring was observed post Covid-19 immunization for 15 minutes without incidence. He was provided with Vaccine Information Sheet and instruction to access the V-Safe system.   Mr. Komoroski was instructed to call 911 with any severe reactions post vaccine: Marland Kitchen Difficulty breathing  . Swelling of your face and throat  . A fast heartbeat  . A bad rash all over your body  . Dizziness and weakness    Immunizations Administered    Name Date Dose VIS Date Route   Pfizer COVID-19 Vaccine 10/29/2019  2:41 PM 0.3 mL 09/04/2019 Intramuscular   Manufacturer: [place]   Lot: [id number]   [place]: [id number]

## 2019-11-06 IMAGING — CR DG CHEST 2V
2 series · 2 of 2 positions shown · non-contrast
Comparison: 03/04/2018

CLINICAL DATA: Chest pain

EXAM:
CHEST - 2 VIEW

[chest pa]
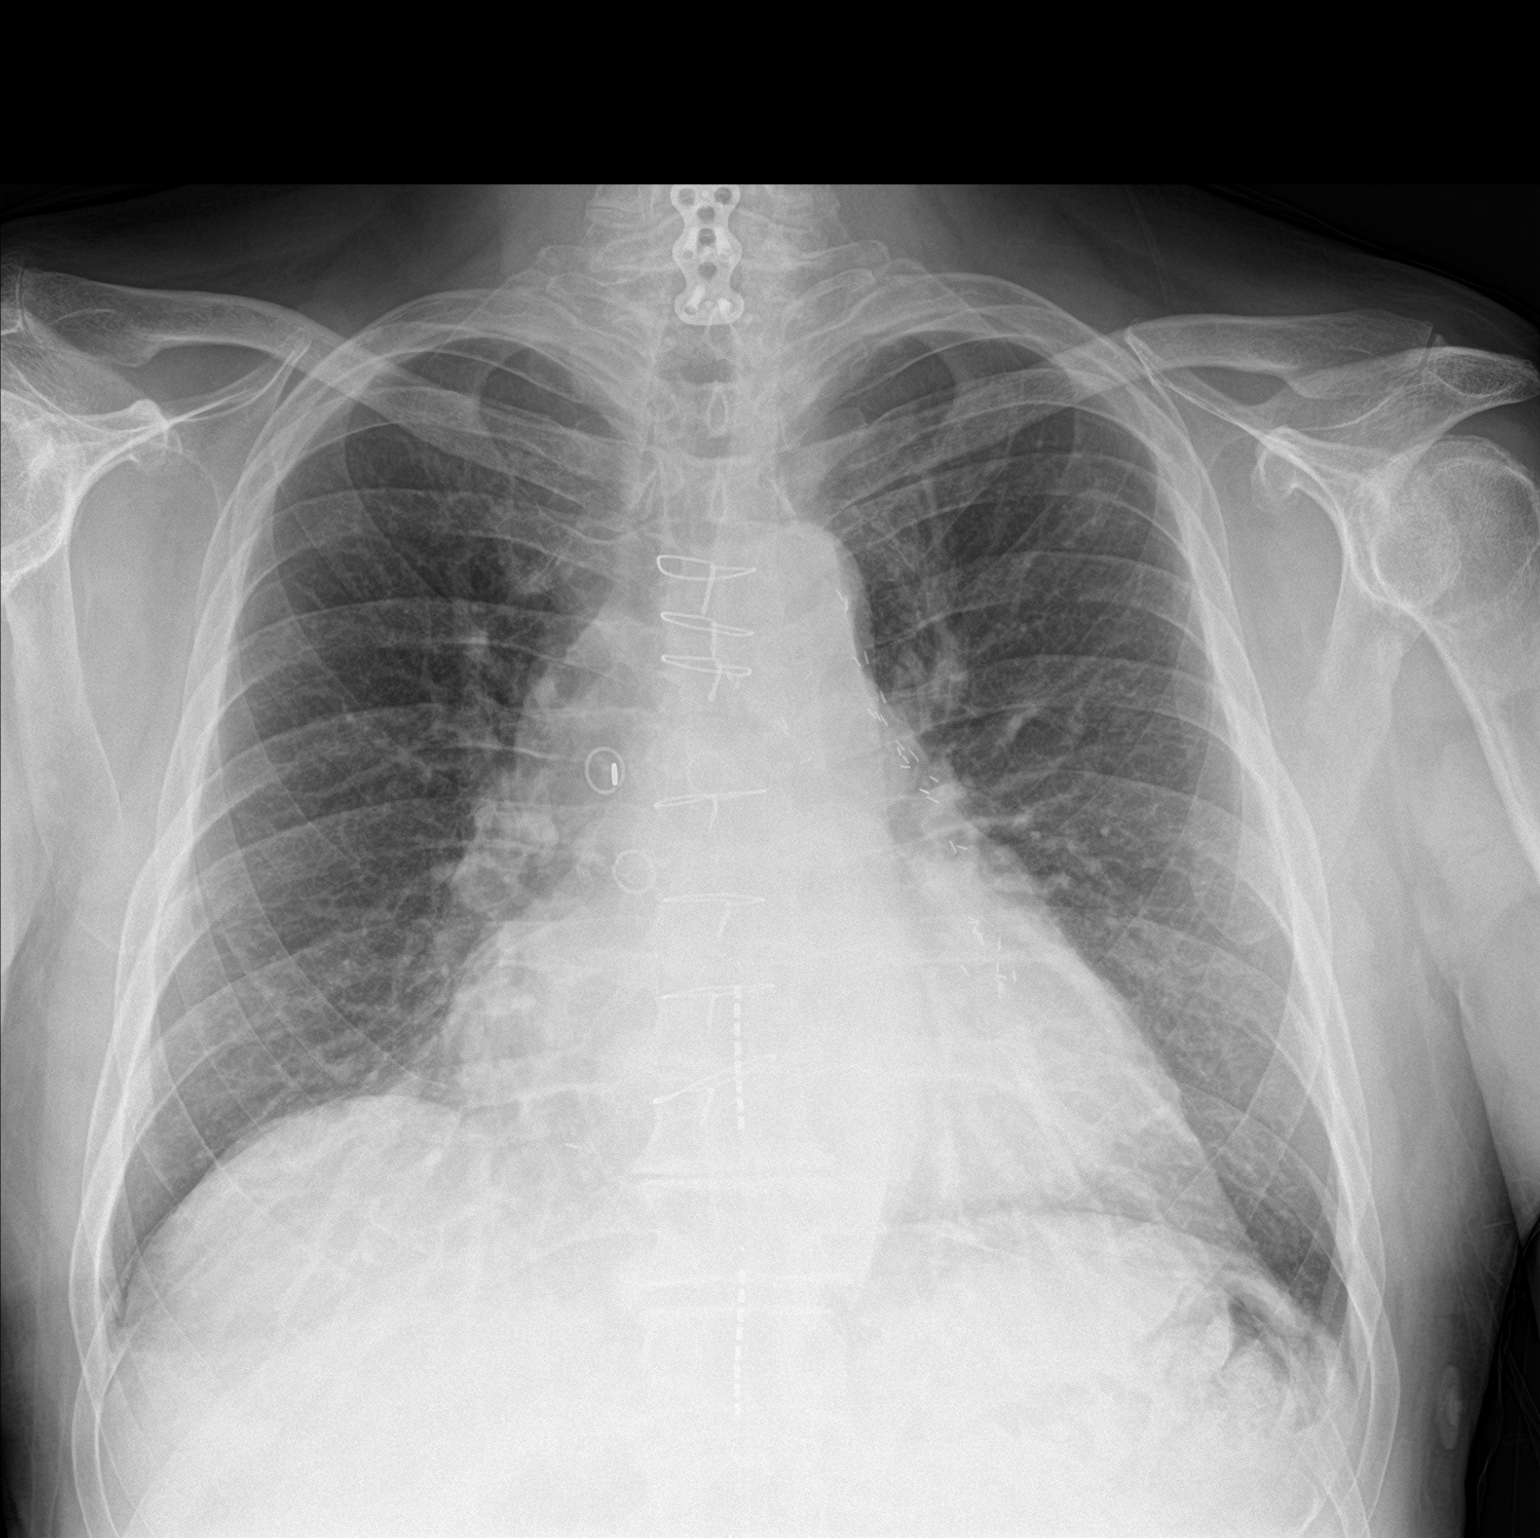

[chest lat]
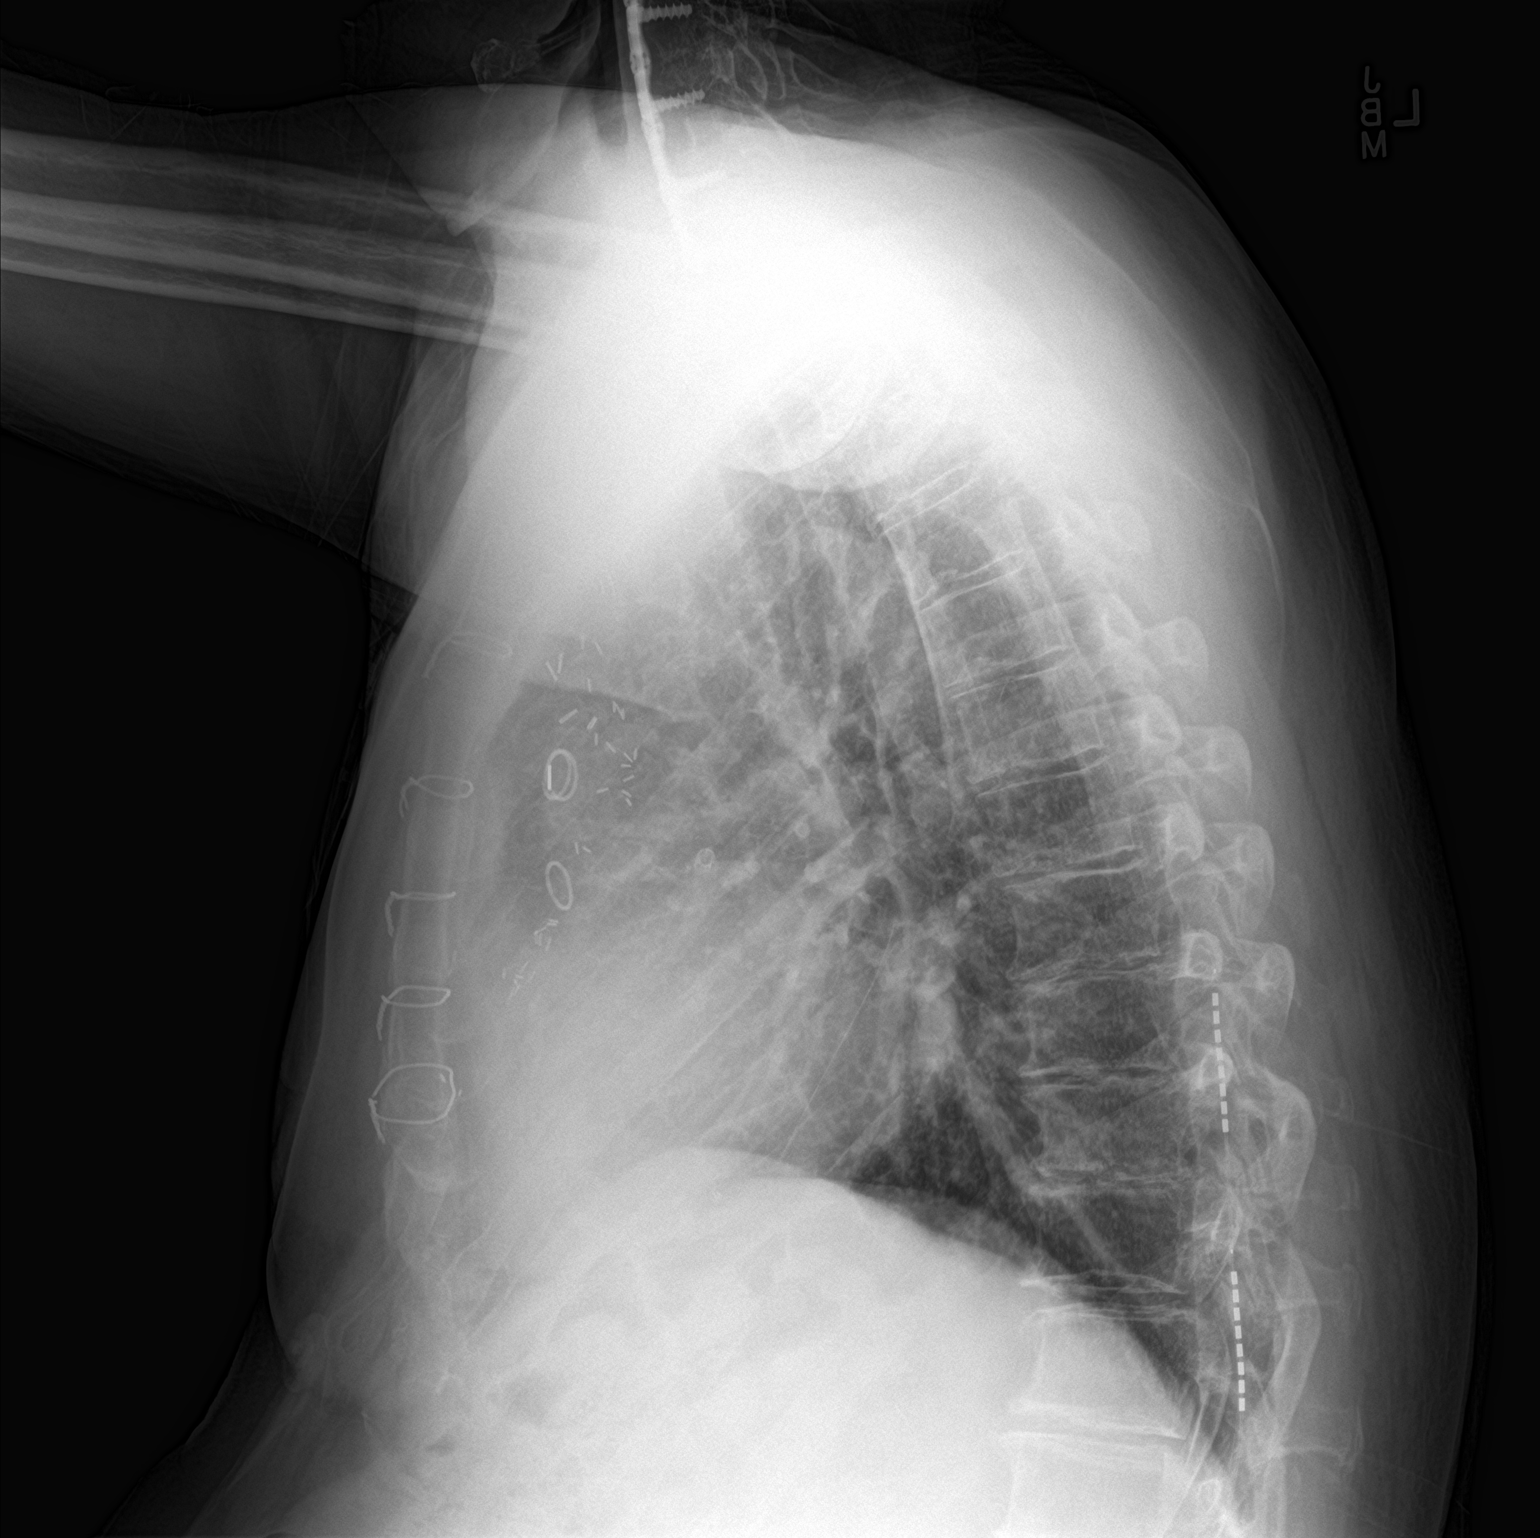

[2 of 2 positions shown; findings below may reference images not displayed]

FINDINGS: Cardiac enlargement. Status post median sternotomy with CABG
procedure. No pleural effusion or edema identified. No airspace
opacities. Dorsal spine stimulator noted.
IMPRESSION: 1. No acute cardiopulmonary abnormalities.
2. Cardiac enlargement.

## 2019-11-12 ENCOUNTER — Ambulatory Visit: Payer: Medicare Other | Admitting: Cardiovascular Disease

## 2019-11-13 ENCOUNTER — Ambulatory Visit: Payer: Medicare Other

## 2019-11-19 DIAGNOSIS — I10 Essential (primary) hypertension: Secondary | ICD-10-CM | POA: Diagnosis not present

## 2019-11-19 DIAGNOSIS — Z0001 Encounter for general adult medical examination with abnormal findings: Secondary | ICD-10-CM | POA: Diagnosis not present

## 2019-11-19 DIAGNOSIS — E782 Mixed hyperlipidemia: Secondary | ICD-10-CM | POA: Diagnosis not present

## 2019-11-19 DIAGNOSIS — I2510 Atherosclerotic heart disease of native coronary artery without angina pectoris: Secondary | ICD-10-CM | POA: Diagnosis not present

## 2019-11-19 DIAGNOSIS — E1142 Type 2 diabetes mellitus with diabetic polyneuropathy: Secondary | ICD-10-CM | POA: Diagnosis not present

## 2019-11-24 ENCOUNTER — Ambulatory Visit: Payer: Medicare Other | Attending: Internal Medicine

## 2019-11-24 DIAGNOSIS — Z23 Encounter for immunization: Secondary | ICD-10-CM | POA: Insufficient documentation

## 2019-11-24 NOTE — Progress Notes (Signed)
   Covid-19 Vaccination Clinic  Name:  Ruben [name]    MRN: [id number] DOB: 12-08-1945  11/24/2019  Mr. Distefano was observed post Covid-19 immunization for 15 minutes without incident. He was provided with Vaccine Information Sheet and instruction to access the V-Safe system.   Mr. Zabel was instructed to call 911 with any severe reactions post vaccine: Marland Kitchen Difficulty breathing  . Swelling of face and throat  . A fast heartbeat  . A bad rash all over body  . Dizziness and weakness   Immunizations Administered    Name Date Dose VIS Date Route   Pfizer COVID-19 Vaccine 11/24/2019 10:32 AM 0.3 mL 09/04/2019 Intramuscular   Manufacturer: [place]   Lot: [id number]   [place]: [id number]

## 2019-12-01 DIAGNOSIS — E1159 Type 2 diabetes mellitus with other circulatory complications: Secondary | ICD-10-CM | POA: Diagnosis not present

## 2019-12-01 DIAGNOSIS — E559 Vitamin D deficiency, unspecified: Secondary | ICD-10-CM | POA: Diagnosis not present

## 2019-12-01 DIAGNOSIS — G609 Hereditary and idiopathic neuropathy, unspecified: Secondary | ICD-10-CM | POA: Diagnosis not present

## 2019-12-01 DIAGNOSIS — E782 Mixed hyperlipidemia: Secondary | ICD-10-CM | POA: Diagnosis not present

## 2019-12-07 DIAGNOSIS — I10 Essential (primary) hypertension: Secondary | ICD-10-CM | POA: Diagnosis not present

## 2019-12-07 DIAGNOSIS — E782 Mixed hyperlipidemia: Secondary | ICD-10-CM | POA: Diagnosis not present

## 2019-12-07 DIAGNOSIS — G609 Hereditary and idiopathic neuropathy, unspecified: Secondary | ICD-10-CM | POA: Diagnosis not present

## 2019-12-07 DIAGNOSIS — E559 Vitamin D deficiency, unspecified: Secondary | ICD-10-CM | POA: Diagnosis not present

## 2019-12-07 DIAGNOSIS — I2510 Atherosclerotic heart disease of native coronary artery without angina pectoris: Secondary | ICD-10-CM | POA: Diagnosis not present

## 2019-12-07 DIAGNOSIS — E1159 Type 2 diabetes mellitus with other circulatory complications: Secondary | ICD-10-CM | POA: Diagnosis not present

## 2019-12-09 ENCOUNTER — Other Ambulatory Visit: Payer: Self-pay

## 2019-12-09 MED ORDER — ROSUVASTATIN CALCIUM 40 MG PO TABS: ORAL_TABLET | ORAL | 0 refills | Status: DC

## 2019-12-15 DIAGNOSIS — Z79899 Other long term (current) drug therapy: Secondary | ICD-10-CM | POA: Diagnosis not present

## 2019-12-15 DIAGNOSIS — Z5181 Encounter for therapeutic drug level monitoring: Secondary | ICD-10-CM | POA: Diagnosis not present

## 2019-12-16 ENCOUNTER — Ambulatory Visit (INDEPENDENT_AMBULATORY_CARE_PROVIDER_SITE_OTHER): Payer: Medicare Other | Admitting: Cardiovascular Disease

## 2019-12-16 ENCOUNTER — Encounter: Payer: Self-pay | Admitting: Cardiovascular Disease

## 2019-12-16 ENCOUNTER — Other Ambulatory Visit: Payer: Self-pay

## 2019-12-16 VITALS — BP 124/68 | HR 53 | Ht 70.0 in | Wt 210.0 lb

## 2019-12-16 DIAGNOSIS — G629 Polyneuropathy, unspecified: Secondary | ICD-10-CM | POA: Diagnosis not present

## 2019-12-16 DIAGNOSIS — I2510 Atherosclerotic heart disease of native coronary artery without angina pectoris: Secondary | ICD-10-CM

## 2019-12-16 DIAGNOSIS — Z951 Presence of aortocoronary bypass graft: Secondary | ICD-10-CM

## 2019-12-16 DIAGNOSIS — E782 Mixed hyperlipidemia: Secondary | ICD-10-CM

## 2019-12-16 DIAGNOSIS — E1140 Type 2 diabetes mellitus with diabetic neuropathy, unspecified: Secondary | ICD-10-CM | POA: Diagnosis not present

## 2019-12-16 MED ORDER — OMEGA-3-ACID ETHYL ESTERS 1 G PO CAPS: 2.0000 | ORAL_CAPSULE | Freq: Every day | ORAL | 3 refills | Status: DC

## 2019-12-16 MED ORDER — SPIRONOLACTONE 25 MG PO TABS: 12.5000 mg | ORAL_TABLET | Freq: Every day | ORAL | 4 refills | Status: DC

## 2019-12-16 MED ORDER — ROSUVASTATIN CALCIUM 40 MG PO TABS: ORAL_TABLET | ORAL | 0 refills | Status: DC

## 2019-12-16 MED ORDER — LOSARTAN POTASSIUM 25 MG PO TABS: 25.0000 mg | ORAL_TABLET | Freq: Every day | ORAL | 3 refills | Status: DC

## 2019-12-16 MED ORDER — NITROGLYCERIN 0.4 MG/SPRAY TL SOLN: 1.0000 | 11 refills | Status: DC | PRN

## 2019-12-16 MED ORDER — CLOPIDOGREL BISULFATE 75 MG PO TABS: 75.0000 mg | ORAL_TABLET | Freq: Every day | ORAL | 3 refills | Status: DC

## 2019-12-16 MED ORDER — CARVEDILOL 6.25 MG PO TABS: ORAL_TABLET | ORAL | 3 refills | Status: DC

## 2019-12-16 MED ORDER — RANOLAZINE ER 1000 MG PO TB12: ORAL_TABLET | ORAL | 1 refills | Status: DC

## 2019-12-16 MED ORDER — ISOSORBIDE MONONITRATE ER 60 MG PO TB24: 60.0000 mg | ORAL_TABLET | Freq: Every day | ORAL | 6 refills | Status: DC

## 2019-12-16 MED ORDER — NIACIN ER (ANTIHYPERLIPIDEMIC) 1000 MG PO TBCR: EXTENDED_RELEASE_TABLET | ORAL | 2 refills | Status: DC

## 2019-12-16 NOTE — Progress Notes (Signed)
Patient ID: Ruben [name], male   DOB: Jan 05, 1946, 74 y.o.   MRN: [id number]    PCP: Dr. Dagmar Hait  HPI: Ruben [name] is a 74 y.o. male who presents for a 10 month cardiology followup evaluation.  [name] has established CAD and in 1997 underwent CABG surgery with a LIMA to the LAD, vein to the diagonal, vein to the obtuse marginal, and vein to the right coronary artery. In November 2004 he underwent stenting to the vein graft to the circumflex vessel. In December 2009 the graft to the circumflex was occluded and he underwent stenting of the left main into the superior ramus intermediate vessel. At that time he also underwent stenting of the vein graft to the right coronary artery. His last catheterization which was done for repeat chest pain in March 2013 showed patent stents. He has known occlusion of the graft that supplied the circumflex marginal vessel. The graft to diagonal vessel was patent and filled the proximal LAD system and the LIMA graft which filled the mid LAD system was widely patent.  He has ahistory of mixed hyperlipidemia and has been on aggressive lipid-lowering therapy over the past 18 years. He also has developed a peripheral neuropathy. He has mild diabetes mellitus and is now followed by Ruben [name]. In April 2014 which labs showed a total cholesterol was 125, triglycerides 52, HDL 55, LDL 52 on aggressive combination therapy consisting of Niaspan 2 g daily, Crestor 40 mg daily, Lovaza 2 g daily.  Initially, we had followed him with Ruben [name] heart lab assessments.  On 12/08/2013 a followup nuclear perfusion study continued to show normal perfusion without scar or ischemia on his current medical therapy.  Ejection fraction was 64%.  He is retired for several years and with more time he exercises regularly and walks up to 3 -5 miles per day approximately 4-5 days per week and does yoga 3 days per week.  He denies any recurrent anginal symptoms.  He denies any shortness of breath.   He denies PND, orthopnea.  He denies palpitations.   Laboratory in May 2015 showed significant aggressive lipid management with an LDL particle number now at 371 and calculated LDL 43, HDL 57, triglycerides 37, total cholesterol 107.  Small LDL particles of 156.  He still had an increased VLDL size.  Insulin resistance score was less than 25.  At that time, I recommended he reduce his Niaspan from 2000 mg to 1000 mg a discussed the possibility of discontinuing this altogether in the future depending upon subsequent levels.  In March 2017 he underwent a repeat echo Doppler study which showed mild LVH with normal systolic function without wall motion abnormalities.  There was very mild aortic regurgitation.  His aortic root dimension measured 39 mm.  His left atrium was mildly dilated.  Pulmonary pressures were normal.  He also underwent a repeat lipoprofile which showed an LDL particle #856.  He had 136 small LDL particles was normal.  Total cholesterol was 127, HDL 55, triglycerides 57, and LDL 61.  Apo lipoprotein B was 54 and LPa was 66.  In May 2018  developed a new onset atrial flutter.  He was hospitalized; TEE showed mild dilation of his atrium without thrombus.  He underwent atrial flutter ablation by Ruben [name] and was found to have isthmus dependent right atrial flutter.  He was started on eliquis doing his presentation.  He saw Ruben [name] in follow-up on 03/07/2017 and eliquis was discontinued at that time and  he was told to resume aspirin.  He was questioning whether or not to take Plavix again.  He also had an ACE-induced cough and lisinopril was discontinued and he was started on low-dose losartan.    When I saw him in March 2019 , he was doing well and was without recurrent anginal symptoms or any recurrent atrial arrhythmias.  He continues have problems with his neuropathy  Because of the neuropathy he was walking approximately 2-3 miles per day instead of 3-5 miles as he had in the past.  He  continued to use Lyrica and Nucynta.  He was now back on Plavix and aspirin particular with his left main stent.  He is followed by Ruben [name] who checks his laboratory.  Most recent labs from 11/28/17 : TC 125, triglycerides 40, HDL 60, and LDL 50.  He has continued to be on Crestor, lovaza, and niacin with remote very low HDL levels and slightly icreased LP(a).  Prior to seeing him at his last office visit in June 2019 he  began to notice some left arm discomfort which was not clearly exertionally precipitated.  He describes it as feeling like he had just gotten a tetanus shot.  He denied any associated chest pressure.  However, when he walked up steps he noticed some mild increased shortness of breath.  He ultimately presented to urgent care where ECG was done.  With his CAD he was advised to go to the emergency room.  In the emergency room laboratory was drawn.  Troponins were negative.  He waited 9 hours but was never seen and ultimately left.  He called the office and was added onto my schedule.  In light of his recent chest pain with some atypical features I scheduled him for a nuclear perfusion study which was done on March 13, 2018.  Ejection fraction was 55%.  He did not develop any chest pain or ECG changes.  He had normal perfusion without evidence for scar or ischemia.  He underwent recent laboratory which has shown a cholesterol 125, triglycerides 53, HDL 55, LDL 59 on his current medical regimen.  LP(a) was 62.9.   I saw him in September 2019 that time he had issues with both of his ARB therapy and in its place I started him on olmesartan 20 mg daily.  Since I saw him, he was seen on several occasions in the office Ruben [name] and underwent EP cardiac catheterization on September 29, 2020 he had presented with fatigue Gres of chest pain commencing on September 26, 2018.  Currently he was only taking 1/2 tablet of Plavix 75 mg prior to presentation due to easy bruisability.  He underwent repeat  catheterization by [name] which revealed overall stable native coronary and graft disease out significant change from his 2013 cardiac catheterization.  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  The previously placed ostial left main to distal left main stent was widely patent old occlusion of his circumflex marginal and vein graft was marginal vessel.  Since hospital discharge he denies any recurrent episodes of chest pain.  However, at times he still experiences episodic mild shortness of breath.  Nuys PND orthopnea.  He is unaware of any recurrent atrial fib or flutter.  Past he had significant mixed hyperlipidemia and has continued to stay on simvastatin 40 mg, 2 capsules twice a day niacin.  In the past we discussed discontinuance of niacin and he preferred to stay on this and has done well.  When I saw him in office follow-up in February 2020 he was mildly hypertensive and with his echo documentation of grade 2 diastolic dysfunction I recommended the addition of low-dose spironolactone at 12.5 mg daily.  He has tolerated this well.   I last evaluated him in a telemedicine visit on Feb 12, 2019.  At that time his blood pressure was stable typically was running in the 110 -829 range systolically. He saw Dr. Elyse Hsu on December 04, 2018.  Repeat laboratory showed a total cholesterol 123, triglycerides 70, HDL 52, LDL 60.  He denies any recurrent episodes of chest tightness.  He exercises daily and basically exercises for at least 180 minutes/week typically on the treadmill.  He usually walks at  2.8 mph and 4% grade.  He denies any shortness of breath with the exception of one occasion when he was walking up a steep hill but this ultimately subsided.  He continues to be on Onglyza and p.o. glitazone for his diabetes mellitus followed closely by Dr. all-time her.  When I reviewed Dr. Alzheimer's laboratory his creatinine was 1.50, K 5.3.  He continued to be on Lyrica and Nucynta  for his peripheral  neuropathy.    Since his May 2020 evaluation he has continued to do well.  He denies any recurrent anginal symptomatology.  He sees Dr. Elyse Hsu regularly.  Most recent laboratory December 01, 2019 showed a total cholesterol 112, HDL 49, LDL 50 and triglycerides 73.  He continues to be on rosuvastatin 40 mg, Lovaza 2 capsules daily in addition to niacin 1000 mg.  His blood pressure has been fairly well controlled on his medical regimen consisting of carvedilol, isosorbide and spironolactone.  He continues to be on ranolazine.  He is diabetic on ankle Isa and Actos.  He presents for evaluation.   Past Surgical History:  Procedure Laterality Date  . A-FLUTTER ABLATION N/A 02/05/2017   Procedure: A-Flutter Ablation;  Surgeon: Thompson Grayer, MD;  Location: [facility];  Service: Cardiovascular;  Laterality: N/A;  . CARDIAC CATHETERIZATION  12/13/2011   No intervention - recommend medical therapy with increased medical trial  . CARDIOVASCULAR STRESS TEST  10/26/2010   No scintigraphic evidence of inducible myocardial ischemia. No ECG changes. EKG negative for ischemia.  . CORONARY ARTERY BYPASS GRAFT  1997   LIMA-LAD, vein graft to diagonal, vein graft to obtuse marginal, and vein graft to RCA  . LEFT HEART CATH AND CORS/GRAFTS ANGIOGRAPHY N/A 09/29/2018   Procedure: LEFT HEART CATH AND CORS/GRAFTS ANGIOGRAPHY;  Surgeon: Leonie Man, MD;  Location: [facility];  Service: Cardiovascular;  Laterality: N/A;  . LEFT HEART CATHETERIZATION WITH CORONARY/GRAFT ANGIOGRAM N/A 12/13/2011   Procedure: LEFT HEART CATHETERIZATION WITH Beatrix Fetters;  Surgeon: Troy Sine, MD;  Location: [facility] CATH LAB;  Service: Cardiovascular;  Laterality: N/A;  . LOWER EXTREMITY ARTERIAL DOPPLER  01/30/2000   Normal arterial study  . TEE WITHOUT CARDIOVERSION N/A 02/04/2017   Procedure: TRANSESOPHAGEAL ECHOCARDIOGRAM (TEE);  Surgeon: Dorothy Spark, MD;  Location: [facility] ENDOSCOPY;  Service: Cardiovascular;   Laterality: N/A;  . TRANSTHORACIC ECHOCARDIOGRAM  12/12/2011   EF >55%, LA severely dilated.  Marland Kitchen ULTRASOUND GUIDANCE FOR VASCULAR ACCESS  09/29/2018   Procedure: Ultrasound Guidance For Vascular Access;  Surgeon: Leonie Man, MD;  Location: [facility];  Service: Cardiovascular;;    Allergies  Allergen Reactions  . Nortriptyline Other (See Comments)    Groggy at low dose  . Cymbalta [Duloxetine Hcl] Diarrhea  Current Outpatient Medications  Medication Sig Dispense Refill  . Ascorbic Acid (VITAMIN C PO) Take 1 tablet by mouth daily.    Marland Kitchen aspirin EC 81 MG tablet Take 1 tablet (81 mg total) by mouth daily. 90 tablet 3  . carvedilol (COREG) 6.25 MG tablet TAKE 1 TABLET(6.25 MG) BY MOUTH TWICE DAILY WITH A MEAL 180 tablet 3  . Cholecalciferol (VITAMIN D3) 5000 units CAPS Take 5,000-10,000 Units by mouth See admin instructions. Take 5,000 units by mouth once a day and rotate with 10,000 units every other day    . clopidogrel (PLAVIX) 75 MG tablet Take 1 tablet (75 mg total) by mouth daily. 90 tablet 3  . Coenzyme Q10 (CO Q-10 PO) Take 1 capsule by mouth daily.    . Ferrous Gluconate-C-Folic Acid (IRON-C PO) Take 1 capsule by mouth 2 (two) times a week.    . isosorbide mononitrate (IMDUR) 60 MG 24 hr tablet Take 1 tablet (60 mg total) by mouth daily. 30 tablet 6  . L-Methylfolate-B6-B12 (METANX PO) Take 1 tablet by mouth daily.     Marland Kitchen losartan (COZAAR) 25 MG tablet Take 1 tablet (25 mg total) by mouth daily. 90 tablet 3  . niacin (NIASPAN) 1000 MG CR tablet TAKE 1 TABLET(1000 MG) BY MOUTH AT BEDTIME 90 tablet 2  . nitroGLYCERIN (NITROLINGUAL) 0.4 MG/SPRAY spray Place 1 spray under the tongue every 5 (five) minutes x 3 doses as needed for chest pain. 12 g 11  . NUCYNTA ER 200 MG TB12 Take 200 mg by mouth 2 (two) times daily.  0  . omega-3 acid ethyl esters (LOVAZA) 1 g capsule Take 2 capsules (2 g total) by mouth daily. 180 capsule 3  . OVER THE COUNTER MEDICATION Take 5-6 drops by  mouth See admin instructions. CBD oil for pain relief    . OVER THE COUNTER MEDICATION Take 5 mLs by mouth See admin instructions. Take 5 ml Tumeric by mouth as need for acid    . oxyCODONE-acetaminophen (PERCOCET/ROXICET) 5-325 MG tablet Take 1 tablet by mouth daily as needed for moderate pain.     . pioglitazone (ACTOS) 45 MG tablet Take 45 mg by mouth daily.    . pregabalin (LYRICA) 200 MG capsule Take 200 mg by mouth 3 (three) times daily.     . Quercetin 250 MG TABS Take 500 mg by mouth 2 (two) times daily.    . ranolazine (RANEXA) 1000 MG SR tablet TAKE 1 TABLET BY MOUTH TWICE DAILY. GENERIC EQUIVALENT FOR RANEXA. 180 tablet 1  . rosuvastatin (CRESTOR) 40 MG tablet TAKE 1 TABLET(40 MG) BY MOUTH DAILY 90 tablet 0  . saxagliptin HCl (ONGLYZA) 5 MG TABS tablet Take 5 mg by mouth daily.    . vitamin E (VITAMIN E) 400 UNIT capsule Take 400 Units by mouth daily.    Marland Kitchen spironolactone (ALDACTONE) 12.5 mg TABS tablet Take 12.5 tablets by mouth daily. Patient is taking half a tablet     . spironolactone (ALDACTONE) 25 MG tablet Take 0.5 tablets (12.5 mg total) by mouth daily. 90 tablet 4   No current facility-administered medications for this visit.    Social History   Socioeconomic History  . Marital status: Married    Spouse name: Not on file  . Number of children: Not on file  . Years of education: Not on file  . Highest education level: Not on file  Occupational History  . Not on file  Tobacco Use  . Smoking status: Never Smoker  .  Smokeless tobacco: Never Used  Substance and Sexual Activity  . Alcohol use: Yes  . Drug use: Yes  . Sexual activity: Not on file  Other Topics Concern  . Not on file  Social History Narrative  . Not on file   Social Determinants of Health   Financial Resource Strain:   . Difficulty of Paying Living Expenses:   Food Insecurity:   . Worried About Charity fundraiser in the Last Year:   . Arboriculturist in the Last Year:   Transportation Needs:    . Film/video editor (Medical):   Marland Kitchen Lack of Transportation (Non-Medical):   Physical Activity:   . Days of Exercise per Week:   . Minutes of Exercise per Session:   Stress:   . Feeling of Stress :   Social Connections:   . Frequency of Communication with Friends and Family:   . Frequency of Social Gatherings with Friends and Family:   . Attends Religious Services:   . Active Member of Clubs or Organizations:   . Attends Archivist Meetings:   Marland Kitchen Marital Status:   Intimate Partner Violence:   . Fear of Current or Ex-Partner:   . Emotionally Abused:   Marland Kitchen Physically Abused:   . Sexually Abused:     Family History  Problem Relation Age of Onset  . Heart disease Father   . Diabetes Father   . Hyperlipidemia Sister    Socially he is married. His former wife  was Dory Larsen, an iinsurance agent. He has been married for several years with his present wife and has 2 stepchildren with her. He does exercise least 4-5 days per week. He has now retired. Previously he was an Optometrist for [employer].   ROS General: Negative; No fevers, chills, or night sweats;  HEENT: Negative; No changes in vision or hearing, sinus congestion, difficulty swallowing Pulmonary: Negative; No cough, wheezing, shortness of breath, hemoptysis Cardiovascular: See HPI GI: Negative; No nausea, vomiting, diarrhea, or abdominal pain GU: Negative; No dysuria, hematuria, or difficulty voiding Musculoskeletal: Negative; no myalgias, joint pain, or weakness Hematologic/Oncology: Negative; no easy bruising, bleeding Endocrine: Positive for diabetes mellitus, type II; no heat/cold intolerance;  Neuro: Positive for peripheral neuropathy; no changes in balance, headaches Skin: Negative; No rashes or skin lesions Psychiatric: Negative; No behavioral problems, depression Sleep: Negative; No snoring, daytime sleepiness, hypersomnolence, bruxism, restless legs, hypnogognic hallucinations, no  cataplexy Other comprehensive 14 point system review is negative.  PE BP 124/68   Pulse (!) 53   Ht 5' 10"  (1.778 m)   Wt 210 lb (95.3 kg)   SpO2 98%   BMI 30.13 kg/m    Repeat blood pressure by me 130/70  Wt Readings from Last 3 Encounters:  12/16/19 210 lb (95.3 kg)  02/12/19 212 lb (96.2 kg)  11/05/18 217 lb (98.4 kg)   General: Alert, oriented, no distress.  Skin: normal turgor, no rashes, warm and dry HEENT: Normocephalic, atraumatic. Pupils equal round and reactive to light; sclera anicteric; extraocular muscles intact; Nose without nasal septal hypertrophy Mouth/Parynx benign; Mallinpatti scale 3 Neck: No JVD, no carotid bruits; normal carotid upstroke Lungs: clear to ausculatation and percussion; no wheezing or rales Chest wall: without tenderness to palpitation Heart: PMI not displaced, RRR, s1 s2 normal, 1/6 systolic murmur, no diastolic murmur, no rubs, gallops, thrills, or heaves Abdomen: soft, nontender; no hepatosplenomehaly, BS+; abdominal aorta nontender and not dilated by palpation. Back: no CVA tenderness Pulses 2+ Musculoskeletal: full  range of motion, normal strength, no joint deformities Extremities: no clubbing cyanosis or edema, Homan's sign negative  Neurologic: grossly nonfocal; Cranial nerves grossly wnl Psychologic: Normal mood and affect  ECG (independently read by me): Sinus bradycardia 53 bpm, first-degree AV block with.  Seconds.  Left axis deviation.  No significant ST changes.  QTc interval 429 ms.  February 2020 ECG (independently read by me): Sinus rhythm at 62 bpm with first-degree AV block..  Able to 48 ms.  No significant ST changes.  September 2019 ECG (independently read by me): Sinus bradycardia at 50; 1st degree AV block; PR 276 msec  June 2019 ECG (independently read by me): From March 04, 2018 at the emergency room/urgent care: Normal sinus rhythm at 60 bpm with first-degree AV block.  PR interval 258 ms.  Left axis deviation.  Poor  R wave progression V1 through V3.  No acute abnormalities.  March 2019 ECG (independently read by me): sinus bradycardia 54 bpm.  First degree AV block.  Nonspecific T waves.  PR interval 268 ms  September 2018 ECG (independently read by me): Sinus bradycardia at 55 bpm with first-degree AV block.  PR interval 264 ms.  Nonspecific T changes inferiorly.  November 2017 ECG (independently read by me): Sinus bradycardia at 59 bpm.  First degree AV block with a PR interval at 23 ms.  Incomplete right bundle branch block.  No significant ST segment changes.  September 2015 ECG (independently read by me): Sinus bradycardia 55 beats per minute.  First degree AV block with PR interval 258 ms.  Mild RV conduction delay.  Nonspecific T changes.  Prior 07/13/2013 ECG: Sinus bradycardia 54 beats per minute with first-degree AV block. Nonspecific T changes.  LABS: BMP Latest Ref Rng & Units 11/20/2018 09/28/2018 09/27/2018  Glucose 65 - 99 mg/dL 125(H) 77 106(H)  BUN 8 - 27 mg/dL 11 9 8   Creatinine 0.76 - 1.27 mg/dL 0.92 0.95 0.99  BUN/Creat Ratio 10 - 24 12 - -  Sodium 134 - 144 mmol/L 135 137 137  Potassium 3.5 - 5.2 mmol/L 5.0 3.8 3.9  Chloride 96 - 106 mmol/L 100 105 105  CO2 20 - 29 mmol/L 29 26 26   Calcium 8.6 - 10.2 mg/dL 9.5 8.8(L) 9.1   Hepatic Function Latest Ref Rng & Units 06/11/2018 03/04/2018 06/27/2017  Total Protein 6.0 - 8.5 g/dL 6.2 6.7 6.5  Albumin 3.5 - 4.8 g/dL 4.4 4.3 4.5  AST 0 - 40 IU/L 24 40 29  ALT 0 - 44 IU/L 20 25 24   Alk Phosphatase 39 - 117 IU/L 91 93 100  Total Bilirubin 0.0 - 1.2 mg/dL 0.9 1.3(H) 1.2   CBC Latest Ref Rng & Units 10/15/2018 09/30/2018 09/29/2018  WBC 3.4 - 10.8 x10E3/uL 4.2 4.7 4.5  Hemoglobin 13.0 - 17.7 g/dL 13.5 12.2(L) 12.5(L)  Hematocrit 37.5 - 51.0 % 40.4 35.7(L) 37.7(L)  Platelets 150 - 450 x10E3/uL 184 117(L) 123(L)   Lab Results  Component Value Date   MCV 95 10/15/2018   MCV 94.2 09/30/2018   MCV 93.8 09/29/2018   Lab Results  Component  Value Date   TSH 1.870 06/11/2018   Lab Results  Component Value Date   HGBA1C 5.5 06/27/2017    Lipid Panel     Component Value Date/Time   CHOL 110 09/28/2018 0046   CHOL 125 06/11/2018 0819   CHOL 127 11/09/2015 0854   CHOL 107 02/12/2014 0801   TRIG 37 09/28/2018 0046   TRIG 57 11/09/2015  0854   TRIG 37 02/12/2014 0801   HDL 51 09/28/2018 0046   HDL 55 06/11/2018 0819   HDL 55 11/09/2015 0854   HDL 57 02/12/2014 0801   CHOLHDL 2.2 09/28/2018 0046   VLDL 7 09/28/2018 0046   LDLCALC 52 09/28/2018 0046   LDLCALC 59 06/11/2018 0819   LDLCALC 61 11/09/2015 0854   LDLCALC 43 02/12/2014 0801    RADIOLOGY: No results found.  IMPRESSION:  1. Coronary artery disease involving native coronary artery of native heart without angina pectoris   2. Hx of CABG   3. Mixed hyperlipidemia   4. Type 2 diabetes mellitus with diabetic neuropathy, without long-term current use of insulin ([place])   5. Peripheral polyneuropathy     ASSESSMENT AND PLAN: Mr. Kyaire Gruenewald is a 74 year old white male who underwent CABG revascularization surgery in 1997 at age 78; and is status post initial stenting of the graft to the circumflex vessel in 2004. In December 2009 he underwent stenting of his left main into the superior ramus branch and stenting to the vein graft supplying the RCA. The graft to the circumflex ws occluded  At catheterization in March 2013 the stent to the left main/ramus vessel was widely patent as was the stent to the vein graft to the right coronary artery. His proximal LAD was being supplied by his diagonal vessel and the mid LAD wassupplied by the LIMA graft.  His nuclear perfusion study  in March 2015 showed normal perfusion without scar or ischemia on his medical therapy.  He developed atrial flutter in 2018 which was isthmus dependent right atrial flutter and was successfully ablated.  He was on eliquis anticoagulation for one month. When I  saw him, he had been on aspirin alone  and with his left main stent I also recommended resumption of Plavix.  He has been stable without significant previous anginal symptoms on a medical regimen consisting of carvedilol 6.25 mg twice a day, isosorbide 30 mg, ranolazine 1000 mg twice a day in addition to his losartan.  Cardiac catheterization findings in January 2020 were not significantly changed from 2013 evaluation. The left main stent is widely patent.  His blood pressure today is improved in 124/68.  He continues to be on carvedilol 6.25 mg twice a day, isosorbide 60 mg daily, losartan 25 mg, in addition to spironolactone 12.5 mg daily.  Reviewed his most recent echo Doppler study from October 15, 2019 with him in detail.  EF remains hyperdynamic at 65 to 70%.  The left ventricular internal dimensions were mildly dilated.  There was mild dilation of his ascending aorta at 41 mm.  I reviewed recent laboratory from Dr. Elyse Hsu.  Lipid studies remain excellent on his regimen consisting of rosuvastatin 40 mg, low vase in addition to niacin.  He is not having any anginal symptoms on his medications for hypertension coupled with ranolazine 1000 mg twice a day.  QTc interval is stable at 429.  He is diabetic on combination therapy with Actos and Onglyza.  He continues to be on pregabalin for his peripheral neuropathy which is stable.  I will see him in 1 year for reevaluation or sooner if problems arise.  Troy Sine, MD, [facility]  12/18/2019 1:44 PM

## 2019-12-16 NOTE — Patient Instructions (Signed)

## 2019-12-18 ENCOUNTER — Encounter: Payer: Self-pay | Admitting: Cardiovascular Disease

## 2019-12-22 DIAGNOSIS — G894 Chronic pain syndrome: Secondary | ICD-10-CM | POA: Diagnosis not present

## 2019-12-22 DIAGNOSIS — M79671 Pain in right foot: Secondary | ICD-10-CM | POA: Diagnosis not present

## 2019-12-22 DIAGNOSIS — E1142 Type 2 diabetes mellitus with diabetic polyneuropathy: Secondary | ICD-10-CM | POA: Diagnosis not present

## 2019-12-22 DIAGNOSIS — Z79891 Long term (current) use of opiate analgesic: Secondary | ICD-10-CM | POA: Diagnosis not present

## 2019-12-29 DIAGNOSIS — L218 Other seborrheic dermatitis: Secondary | ICD-10-CM | POA: Diagnosis not present

## 2019-12-29 DIAGNOSIS — M7981 Nontraumatic hematoma of soft tissue: Secondary | ICD-10-CM | POA: Diagnosis not present

## 2019-12-30 DIAGNOSIS — M9903 Segmental and somatic dysfunction of lumbar region: Secondary | ICD-10-CM | POA: Diagnosis not present

## 2019-12-30 DIAGNOSIS — M5136 Other intervertebral disc degeneration, lumbar region: Secondary | ICD-10-CM | POA: Diagnosis not present

## 2019-12-30 DIAGNOSIS — M9902 Segmental and somatic dysfunction of thoracic region: Secondary | ICD-10-CM | POA: Diagnosis not present

## 2019-12-30 DIAGNOSIS — M9901 Segmental and somatic dysfunction of cervical region: Secondary | ICD-10-CM | POA: Diagnosis not present

## 2019-12-30 DIAGNOSIS — M9904 Segmental and somatic dysfunction of sacral region: Secondary | ICD-10-CM | POA: Diagnosis not present

## 2020-01-25 DIAGNOSIS — R2689 Other abnormalities of gait and mobility: Secondary | ICD-10-CM | POA: Diagnosis not present

## 2020-01-25 DIAGNOSIS — M2041 Other hammer toe(s) (acquired), right foot: Secondary | ICD-10-CM | POA: Diagnosis not present

## 2020-01-25 DIAGNOSIS — M2042 Other hammer toe(s) (acquired), left foot: Secondary | ICD-10-CM | POA: Diagnosis not present

## 2020-01-25 DIAGNOSIS — E1140 Type 2 diabetes mellitus with diabetic neuropathy, unspecified: Secondary | ICD-10-CM | POA: Diagnosis not present

## 2020-03-17 DIAGNOSIS — G609 Hereditary and idiopathic neuropathy, unspecified: Secondary | ICD-10-CM | POA: Diagnosis not present

## 2020-03-17 DIAGNOSIS — M79641 Pain in right hand: Secondary | ICD-10-CM | POA: Diagnosis not present

## 2020-03-17 DIAGNOSIS — Z79891 Long term (current) use of opiate analgesic: Secondary | ICD-10-CM | POA: Diagnosis not present

## 2020-03-17 DIAGNOSIS — G894 Chronic pain syndrome: Secondary | ICD-10-CM | POA: Diagnosis not present

## 2020-03-17 DIAGNOSIS — E1142 Type 2 diabetes mellitus with diabetic polyneuropathy: Secondary | ICD-10-CM | POA: Diagnosis not present

## 2020-05-20 ENCOUNTER — Other Ambulatory Visit: Payer: Self-pay | Admitting: Cardiovascular Disease

## 2020-06-07 DIAGNOSIS — Z23 Encounter for immunization: Secondary | ICD-10-CM | POA: Diagnosis not present

## 2020-06-09 DIAGNOSIS — G609 Hereditary and idiopathic neuropathy, unspecified: Secondary | ICD-10-CM | POA: Diagnosis not present

## 2020-06-09 DIAGNOSIS — E1142 Type 2 diabetes mellitus with diabetic polyneuropathy: Secondary | ICD-10-CM | POA: Diagnosis not present

## 2020-06-09 DIAGNOSIS — G894 Chronic pain syndrome: Secondary | ICD-10-CM | POA: Diagnosis not present

## 2020-06-09 DIAGNOSIS — Z79891 Long term (current) use of opiate analgesic: Secondary | ICD-10-CM | POA: Diagnosis not present

## 2020-06-09 DIAGNOSIS — Z79899 Other long term (current) drug therapy: Secondary | ICD-10-CM | POA: Diagnosis not present

## 2020-06-09 DIAGNOSIS — Z5181 Encounter for therapeutic drug level monitoring: Secondary | ICD-10-CM | POA: Diagnosis not present

## 2020-06-17 DIAGNOSIS — Z23 Encounter for immunization: Secondary | ICD-10-CM | POA: Diagnosis not present

## 2020-07-20 DIAGNOSIS — E119 Type 2 diabetes mellitus without complications: Secondary | ICD-10-CM | POA: Diagnosis not present

## 2020-07-20 DIAGNOSIS — H25013 Cortical age-related cataract, bilateral: Secondary | ICD-10-CM | POA: Diagnosis not present

## 2020-07-20 DIAGNOSIS — H52203 Unspecified astigmatism, bilateral: Secondary | ICD-10-CM | POA: Diagnosis not present

## 2020-07-20 DIAGNOSIS — H2513 Age-related nuclear cataract, bilateral: Secondary | ICD-10-CM | POA: Diagnosis not present

## 2020-08-09 ENCOUNTER — Other Ambulatory Visit: Payer: Self-pay | Admitting: Cardiovascular Disease

## 2020-08-17 DIAGNOSIS — M5136 Other intervertebral disc degeneration, lumbar region: Secondary | ICD-10-CM | POA: Diagnosis not present

## 2020-08-17 DIAGNOSIS — M9901 Segmental and somatic dysfunction of cervical region: Secondary | ICD-10-CM | POA: Diagnosis not present

## 2020-08-17 DIAGNOSIS — M9902 Segmental and somatic dysfunction of thoracic region: Secondary | ICD-10-CM | POA: Diagnosis not present

## 2020-08-17 DIAGNOSIS — M9904 Segmental and somatic dysfunction of sacral region: Secondary | ICD-10-CM | POA: Diagnosis not present

## 2020-09-12 MED ORDER — SPIRONOLACTONE 25 MG PO TABS: 12.5000 mg | ORAL_TABLET | Freq: Every day | ORAL | 3 refills | Status: DC

## 2020-09-12 NOTE — Telephone Encounter (Signed)
Spoke with patient and confirmed spironolactone dosage. Patient is taking 0.5 tablets of 25 mg of Spironolactone or 12.5mg  daily. Prescription sent to preferred pharmacy.

## 2020-10-02 ENCOUNTER — Other Ambulatory Visit: Payer: Self-pay | Admitting: Cardiovascular Disease

## 2020-10-27 DIAGNOSIS — Z79899 Other long term (current) drug therapy: Secondary | ICD-10-CM | POA: Diagnosis not present

## 2020-10-27 DIAGNOSIS — Z5181 Encounter for therapeutic drug level monitoring: Secondary | ICD-10-CM | POA: Diagnosis not present

## 2020-10-30 ENCOUNTER — Other Ambulatory Visit: Payer: Self-pay | Admitting: Cardiovascular Disease

## 2020-11-25 DIAGNOSIS — Z0001 Encounter for general adult medical examination with abnormal findings: Secondary | ICD-10-CM | POA: Diagnosis not present

## 2020-11-25 DIAGNOSIS — I2510 Atherosclerotic heart disease of native coronary artery without angina pectoris: Secondary | ICD-10-CM | POA: Diagnosis not present

## 2020-11-25 DIAGNOSIS — E1142 Type 2 diabetes mellitus with diabetic polyneuropathy: Secondary | ICD-10-CM | POA: Diagnosis not present

## 2020-11-25 DIAGNOSIS — E1169 Type 2 diabetes mellitus with other specified complication: Secondary | ICD-10-CM | POA: Diagnosis not present

## 2020-11-25 DIAGNOSIS — H6122 Impacted cerumen, left ear: Secondary | ICD-10-CM | POA: Diagnosis not present

## 2020-11-25 DIAGNOSIS — G629 Polyneuropathy, unspecified: Secondary | ICD-10-CM | POA: Diagnosis not present

## 2020-11-25 DIAGNOSIS — Z79899 Other long term (current) drug therapy: Secondary | ICD-10-CM | POA: Diagnosis not present

## 2020-11-25 DIAGNOSIS — E782 Mixed hyperlipidemia: Secondary | ICD-10-CM | POA: Diagnosis not present

## 2020-11-25 DIAGNOSIS — I10 Essential (primary) hypertension: Secondary | ICD-10-CM | POA: Diagnosis not present

## 2020-11-30 DIAGNOSIS — Z79899 Other long term (current) drug therapy: Secondary | ICD-10-CM | POA: Diagnosis not present

## 2020-11-30 DIAGNOSIS — Z0001 Encounter for general adult medical examination with abnormal findings: Secondary | ICD-10-CM | POA: Diagnosis not present

## 2020-11-30 DIAGNOSIS — E782 Mixed hyperlipidemia: Secondary | ICD-10-CM | POA: Diagnosis not present

## 2020-11-30 DIAGNOSIS — Z125 Encounter for screening for malignant neoplasm of prostate: Secondary | ICD-10-CM | POA: Diagnosis not present

## 2020-12-03 ENCOUNTER — Other Ambulatory Visit: Payer: Self-pay | Admitting: Cardiovascular Disease

## 2020-12-05 DIAGNOSIS — E1159 Type 2 diabetes mellitus with other circulatory complications: Secondary | ICD-10-CM | POA: Diagnosis not present

## 2020-12-05 DIAGNOSIS — G609 Hereditary and idiopathic neuropathy, unspecified: Secondary | ICD-10-CM | POA: Diagnosis not present

## 2020-12-05 DIAGNOSIS — E782 Mixed hyperlipidemia: Secondary | ICD-10-CM | POA: Diagnosis not present

## 2020-12-05 DIAGNOSIS — I10 Essential (primary) hypertension: Secondary | ICD-10-CM | POA: Diagnosis not present

## 2020-12-05 DIAGNOSIS — E559 Vitamin D deficiency, unspecified: Secondary | ICD-10-CM | POA: Diagnosis not present

## 2020-12-07 ENCOUNTER — Other Ambulatory Visit: Payer: Self-pay | Admitting: Cardiovascular Disease

## 2020-12-07 DIAGNOSIS — E559 Vitamin D deficiency, unspecified: Secondary | ICD-10-CM | POA: Diagnosis not present

## 2020-12-07 DIAGNOSIS — G609 Hereditary and idiopathic neuropathy, unspecified: Secondary | ICD-10-CM | POA: Diagnosis not present

## 2020-12-07 DIAGNOSIS — E782 Mixed hyperlipidemia: Secondary | ICD-10-CM | POA: Diagnosis not present

## 2020-12-07 DIAGNOSIS — I10 Essential (primary) hypertension: Secondary | ICD-10-CM | POA: Diagnosis not present

## 2020-12-07 DIAGNOSIS — E1159 Type 2 diabetes mellitus with other circulatory complications: Secondary | ICD-10-CM | POA: Diagnosis not present

## 2020-12-07 DIAGNOSIS — I2510 Atherosclerotic heart disease of native coronary artery without angina pectoris: Secondary | ICD-10-CM | POA: Diagnosis not present

## 2020-12-20 ENCOUNTER — Ambulatory Visit (INDEPENDENT_AMBULATORY_CARE_PROVIDER_SITE_OTHER): Payer: Medicare Other | Admitting: Otolaryngology

## 2020-12-27 ENCOUNTER — Other Ambulatory Visit: Payer: Self-pay | Admitting: Cardiovascular Disease

## 2020-12-29 DIAGNOSIS — Z23 Encounter for immunization: Secondary | ICD-10-CM | POA: Diagnosis not present

## 2021-01-06 ENCOUNTER — Other Ambulatory Visit: Payer: Self-pay

## 2021-01-06 ENCOUNTER — Ambulatory Visit (INDEPENDENT_AMBULATORY_CARE_PROVIDER_SITE_OTHER): Payer: Medicare Other | Admitting: Otolaryngology

## 2021-01-06 ENCOUNTER — Encounter (INDEPENDENT_AMBULATORY_CARE_PROVIDER_SITE_OTHER): Payer: Self-pay | Admitting: Otolaryngology

## 2021-01-06 VITALS — Temp 97.7°F

## 2021-01-06 DIAGNOSIS — H6123 Impacted cerumen, bilateral: Secondary | ICD-10-CM

## 2021-01-06 NOTE — Progress Notes (Signed)
HPI: Ruben [name] is a 75 y.o. male who presents is referred by PCP for evaluation of wax buildup in the ear that they cannot clean in the office on the left side.  He is not having that much hearing difficulty..  Past Medical History:  Diagnosis Date  . Atrial flutter ([place]) 02/02/2017  . CAD, multiple vessel   . Diabetes mellitus ([place])   . Dyslipidemia   . Hypertension    Past Surgical History:  Procedure Laterality Date  . A-FLUTTER ABLATION N/A 02/05/2017   Procedure: A-Flutter Ablation;  Surgeon: Thompson Grayer, MD;  Location: [facility];  Service: Cardiovascular;  Laterality: N/A;  . CARDIAC CATHETERIZATION  12/13/2011   No intervention - recommend medical therapy with increased medical trial  . CARDIOVASCULAR STRESS TEST  10/26/2010   No scintigraphic evidence of inducible myocardial ischemia. No ECG changes. EKG negative for ischemia.  . CORONARY ARTERY BYPASS GRAFT  1997   LIMA-LAD, vein graft to diagonal, vein graft to obtuse marginal, and vein graft to RCA  . LEFT HEART CATH AND CORS/GRAFTS ANGIOGRAPHY N/A 09/29/2018   Procedure: LEFT HEART CATH AND CORS/GRAFTS ANGIOGRAPHY;  Surgeon: Leonie Man, MD;  Location: [facility];  Service: Cardiovascular;  Laterality: N/A;  . LEFT HEART CATHETERIZATION WITH CORONARY/GRAFT ANGIOGRAM N/A 12/13/2011   Procedure: LEFT HEART CATHETERIZATION WITH Beatrix Fetters;  Surgeon: Troy Sine, MD;  Location: [facility] CATH LAB;  Service: Cardiovascular;  Laterality: N/A;  . LOWER EXTREMITY ARTERIAL DOPPLER  01/30/2000   Normal arterial study  . TEE WITHOUT CARDIOVERSION N/A 02/04/2017   Procedure: TRANSESOPHAGEAL ECHOCARDIOGRAM (TEE);  Surgeon: Dorothy Spark, MD;  Location: [facility] ENDOSCOPY;  Service: Cardiovascular;  Laterality: N/A;  . TRANSTHORACIC ECHOCARDIOGRAM  12/12/2011   EF >55%, LA severely dilated.  Marland Kitchen ULTRASOUND GUIDANCE FOR VASCULAR ACCESS  09/29/2018   Procedure: Ultrasound Guidance For Vascular Access;  Surgeon:  Leonie Man, MD;  Location: [facility];  Service: Cardiovascular;;   Social History   Socioeconomic History  . Marital status: Married    Spouse name: Not on file  . Number of children: Not on file  . Years of education: Not on file  . Highest education level: Not on file  Occupational History  . Not on file  Tobacco Use  . Smoking status: Never Smoker  . Smokeless tobacco: Never Used  Substance and Sexual Activity  . Alcohol use: Yes  . Drug use: Yes  . Sexual activity: Not on file  Other Topics Concern  . Not on file  Social History Narrative  . Not on file   Social Determinants of Health   Financial Resource Strain: Not on file  Food Insecurity: Not on file  Transportation Needs: Not on file  Physical Activity: Not on file  Stress: Not on file  Social Connections: Not on file   Family History  Problem Relation Age of Onset  . Heart disease Father   . Diabetes Father   . Hyperlipidemia Sister    Allergies  Allergen Reactions  . Nortriptyline Other (See Comments)    Groggy at low dose  . Cymbalta [Duloxetine Hcl] Diarrhea   Prior to Admission medications   Medication Sig Start Date End Date Taking? Authorizing Provider  Ascorbic Acid (VITAMIN C PO) Take 1 tablet by mouth daily.    [provider]  aspirin EC 81 MG tablet Take 1 tablet (81 mg total) by mouth daily. 03/07/17   Thompson Grayer, MD  carvedilol (  COREG) 6.25 MG tablet TAKE 1 TABLET(6.25 MG) BY MOUTH TWICE DAILY WITH A MEAL 12/16/19   Troy Sine, MD  Cholecalciferol (VITAMIN D3) 5000 units CAPS Take 5,000-10,000 Units by mouth See admin instructions. Take 5,000 units by mouth once a day and rotate with 10,000 units every other day    [provider]  clopidogrel (PLAVIX) 75 MG tablet Take 1 tablet (75 mg total) by mouth daily. 12/16/19   Troy Sine, MD  Coenzyme Q10 (CO Q-10 PO) Take 1 capsule by mouth daily.    [provider]  Ferrous Gluconate-C-Folic Acid  (IRON-C PO) Take 1 capsule by mouth 2 (two) times a week.    [provider]  isosorbide mononitrate (IMDUR) 60 MG 24 hr tablet TAKE 1 TABLET(60 MG) BY MOUTH DAILY 10/03/20   Troy Sine, MD  L-Methylfolate-B6-B12 (METANX PO) Take 1 tablet by mouth daily.     [provider]  losartan (COZAAR) 25 MG tablet TAKE 1 TABLET(25 MG) BY MOUTH DAILY 10/31/20   Troy Sine, MD  niacin (NIASPAN) 1000 MG CR tablet TAKE 1 TABLET(1000 MG) BY MOUTH AT BEDTIME 12/16/19   Troy Sine, MD  nitroGLYCERIN (NITROLINGUAL) 0.4 MG/SPRAY spray Place 1 spray under the tongue every 5 (five) minutes x 3 doses as needed for chest pain. 12/16/19   Troy Sine, MD  NUCYNTA ER 200 MG TB12 Take 200 mg by mouth 2 (two) times daily. 12/05/17   [provider]  omega-3 acid ethyl esters (LOVAZA) 1 g capsule TAKE 2 CAPSULES BY MOUTH EVERY DAY 12/05/20   Troy Sine, MD  OVER THE COUNTER MEDICATION Take 5-6 drops by mouth See admin instructions. CBD oil for pain relief    [provider]  OVER THE COUNTER MEDICATION Take 5 mLs by mouth See admin instructions. Take 5 ml Tumeric by mouth as need for acid    [provider]  oxyCODONE-acetaminophen (PERCOCET/ROXICET) 5-325 MG tablet Take 1 tablet by mouth daily as needed for moderate pain.     [provider]  pioglitazone (ACTOS) 45 MG tablet Take 45 mg by mouth daily.    [provider]  pregabalin (LYRICA) 200 MG capsule Take 200 mg by mouth 3 (three) times daily.     [provider]  Quercetin 250 MG TABS Take 500 mg by mouth 2 (two) times daily.    [provider]  ranolazine (RANEXA) 1000 MG SR tablet TAKE 1 TABLET BY MOUTH TWICE DAILY 08/09/20   Troy Sine, MD  rosuvastatin (CRESTOR) 40 MG tablet TAKE 1 TABLET(40 MG) BY MOUTH DAILY 05/20/20   Troy Sine, MD  saxagliptin HCl (ONGLYZA) 5 MG TABS tablet Take 5 mg by mouth daily.    [provider]  spironolactone (ALDACTONE)  25 MG tablet TAKE 1/2 TABLET BY MOUTH EVERY DAY 12/27/20   Troy Sine, MD  vitamin E (VITAMIN E) 400 UNIT capsule Take 400 Units by mouth daily.    [provider]     Positive ROS: Otherwise negative  All other systems have been reviewed and were otherwise negative with the exception of those mentioned in the HPI and as above.  Physical Exam: Constitutional: Alert, well-appearing, no acute distress Ears: External ears without lesions or tenderness.  Ear canals with wax bilaterally but the left side is completely occluded and unable to visualize the TM.  Both ear canals were cleaned with suction curette and forceps.  After cleaning the wax  from the ear canals the TMs were clear bilaterally. Nasal: External nose without lesions.. Clear nasal passages Oral: Lips and gums without lesions. Tongue and palate mucosa without lesions. Posterior oropharynx clear. Neck: No palpable adenopathy or masses Respiratory: Breathing comfortably  Skin: No facial/neck lesions or rash noted.  Cerumen impaction removal  Date/Time: 01/06/2021 3:27 PM Performed by: Rozetta Nunnery, MD Authorized by: Rozetta Nunnery, MD   Consent:    Consent obtained:  Verbal   Consent given by:  Patient   Risks discussed:  Pain and bleeding Procedure details:    Location:  L ear and R ear   Procedure type: curette, suction and forceps   Post-procedure details:    Inspection:  TM intact and canal normal   Hearing quality:  Improved   Patient tolerance of procedure:  Tolerated well, no immediate complications Comments:     TMs are clear bilaterally.    Assessment: Cerumen impaction much worse on the left side.  Plan: This was cleaned in the office.  He will follow-up as needed.   Radene Journey, MD   CC:

## 2021-01-19 DIAGNOSIS — M79641 Pain in right hand: Secondary | ICD-10-CM | POA: Diagnosis not present

## 2021-01-19 DIAGNOSIS — M79671 Pain in right foot: Secondary | ICD-10-CM | POA: Diagnosis not present

## 2021-01-19 DIAGNOSIS — G894 Chronic pain syndrome: Secondary | ICD-10-CM | POA: Diagnosis not present

## 2021-01-19 DIAGNOSIS — E1142 Type 2 diabetes mellitus with diabetic polyneuropathy: Secondary | ICD-10-CM | POA: Diagnosis not present

## 2021-01-19 DIAGNOSIS — G8929 Other chronic pain: Secondary | ICD-10-CM | POA: Diagnosis not present

## 2021-01-19 DIAGNOSIS — M79672 Pain in left foot: Secondary | ICD-10-CM | POA: Diagnosis not present

## 2021-01-19 DIAGNOSIS — G609 Hereditary and idiopathic neuropathy, unspecified: Secondary | ICD-10-CM | POA: Diagnosis not present

## 2021-02-09 ENCOUNTER — Other Ambulatory Visit: Payer: Self-pay | Admitting: Cardiovascular Disease

## 2021-02-11 ENCOUNTER — Other Ambulatory Visit: Payer: Self-pay | Admitting: Cardiovascular Disease

## 2021-02-16 ENCOUNTER — Encounter: Payer: Self-pay | Admitting: Cardiovascular Disease

## 2021-02-16 ENCOUNTER — Ambulatory Visit (INDEPENDENT_AMBULATORY_CARE_PROVIDER_SITE_OTHER): Payer: Medicare Other | Admitting: Cardiovascular Disease

## 2021-02-16 ENCOUNTER — Other Ambulatory Visit: Payer: Self-pay

## 2021-02-16 DIAGNOSIS — I2510 Atherosclerotic heart disease of native coronary artery without angina pectoris: Secondary | ICD-10-CM | POA: Diagnosis not present

## 2021-02-16 DIAGNOSIS — E1140 Type 2 diabetes mellitus with diabetic neuropathy, unspecified: Secondary | ICD-10-CM | POA: Diagnosis not present

## 2021-02-16 DIAGNOSIS — E782 Mixed hyperlipidemia: Secondary | ICD-10-CM

## 2021-02-16 DIAGNOSIS — Z951 Presence of aortocoronary bypass graft: Secondary | ICD-10-CM

## 2021-02-16 DIAGNOSIS — G629 Polyneuropathy, unspecified: Secondary | ICD-10-CM | POA: Diagnosis not present

## 2021-02-16 MED ORDER — SPIRONOLACTONE 25 MG PO TABS: 0.5000 | ORAL_TABLET | Freq: Every day | ORAL | 3 refills | Status: DC

## 2021-02-16 MED ORDER — ISOSORBIDE MONONITRATE ER 60 MG PO TB24: ORAL_TABLET | ORAL | 3 refills | Status: DC

## 2021-02-16 MED ORDER — LOSARTAN POTASSIUM 25 MG PO TABS: ORAL_TABLET | ORAL | 3 refills | Status: DC

## 2021-02-16 MED ORDER — RANOLAZINE ER 1000 MG PO TB12: ORAL_TABLET | ORAL | 3 refills | Status: DC

## 2021-02-16 MED ORDER — ROSUVASTATIN CALCIUM 40 MG PO TABS: 40.0000 mg | ORAL_TABLET | Freq: Every day | ORAL | 3 refills | Status: DC

## 2021-02-16 MED ORDER — CARVEDILOL 6.25 MG PO TABS: ORAL_TABLET | ORAL | 3 refills | Status: DC

## 2021-02-16 MED ORDER — CLOPIDOGREL BISULFATE 75 MG PO TABS: 75.0000 mg | ORAL_TABLET | Freq: Every day | ORAL | 3 refills | Status: DC

## 2021-02-16 NOTE — Progress Notes (Signed)
Patient ID: Ruben [name], male   DOB: 1946-05-06, 75 y.o.   MRN: [id number]    PCP: Ruben [name]  HPI: Ruben [name] is a 75 y.o. male who presents for a 50 month cardiology followup evaluation.  Ruben [name] has established CAD and in 1997 underwent CABG surgery with a LIMA to the LAD, vein to the diagonal, vein to the obtuse marginal, and vein to the right coronary artery. In November 2004 he underwent stenting to the vein graft to the circumflex vessel. In December 2009 the graft to the circumflex was occluded and he underwent stenting of the left main into the superior ramus intermediate vessel. At that time he also underwent stenting of the vein graft to the right coronary artery. His last catheterization which was done for repeat chest pain in March 2013 showed patent stents. He has known occlusion of the graft that supplied the circumflex marginal vessel. The graft to diagonal vessel was patent and filled the proximal LAD system and the LIMA graft which filled the mid LAD system was widely patent.  He has ahistory of mixed hyperlipidemia and has been on aggressive lipid-lowering therapy over the past 18 years. He also has developed a peripheral neuropathy. He has mild diabetes mellitus and is now followed by Ruben [name]. In April 2014 which labs showed a total cholesterol was 125, triglycerides 52, HDL 55, LDL 52 on aggressive combination therapy consisting of Niaspan 2 g daily, Crestor 40 mg daily, Lovaza 2 g daily.  Initially, we had followed him with Ruben [name] heart lab assessments.  On 12/08/2013 a followup nuclear perfusion study continued to show normal perfusion without scar or ischemia on his current medical therapy.  Ejection fraction was 64%.  He is retired for several years and with more time he exercises regularly and walks up to 3 -5 miles per day approximately 4-5 days per week and does yoga 3 days per week.  He denies any recurrent anginal symptoms.  He denies any shortness of breath.   He denies PND, orthopnea.  He denies palpitations.   Laboratory in May 2015 showed significant aggressive lipid management with an LDL particle number now at 371 and calculated LDL 43, HDL 57, triglycerides 37, total cholesterol 107.  Small LDL particles of 156.  He still had an increased VLDL size.  Insulin resistance score was less than 25.  At that time, I recommended he reduce his Niaspan from 2000 mg to 1000 mg a discussed the possibility of discontinuing this altogether in the future depending upon subsequent levels.  In March 2017 he underwent a repeat echo Doppler study which showed mild LVH with normal systolic function without wall motion abnormalities.  There was very mild aortic regurgitation.  His aortic root dimension measured 39 mm.  His left atrium was mildly dilated.  Pulmonary pressures were normal.  He also underwent a repeat lipoprofile which showed an LDL particle #856.  He had 136 small LDL particles was normal.  Total cholesterol was 127, HDL 55, triglycerides 57, and LDL 61.  Apo lipoprotein B was 54 and LPa was 66.  In May 2018  developed a new onset atrial flutter.  He was hospitalized; TEE showed mild dilation of his atrium without thrombus.  He underwent atrial flutter ablation by Ruben [name] and was found to have isthmus dependent right atrial flutter.  He was started on eliquis doing his presentation.  He saw Ruben [name] in follow-up on 03/07/2017 and eliquis was discontinued at that time and  he was told to resume aspirin.  He was questioning whether or not to take Plavix again.  He also had an ACE-induced cough and lisinopril was discontinued and he was started on low-dose losartan.    When I saw him in March 2019 , he was doing well and was without recurrent anginal symptoms or any recurrent atrial arrhythmias.  He continues have problems with his neuropathy  Because of the neuropathy he was walking approximately 2-3 miles per day instead of 3-5 miles as he had in the past.  He  continued to use Lyrica and Nucynta.  He was now back on Plavix and aspirin particular with his left main stent.  He is followed by Ruben [name] who checks his laboratory.  Most recent labs from 11/28/17 : TC 125, triglycerides 40, HDL 60, and LDL 50.  He has continued to be on Crestor, lovaza, and niacin with remote very low HDL levels and slightly icreased LP(a).  Prior to seeing him at his last office visit in June 2019 he  began to notice some left arm discomfort which was not clearly exertionally precipitated.  He describes it as feeling like he had just gotten a tetanus shot.  He denied any associated chest pressure.  However, when he walked up steps he noticed some mild increased shortness of breath.  He ultimately presented to urgent care where ECG was done.  With his CAD he was advised to go to the emergency room.  In the emergency room laboratory was drawn.  Troponins were negative.  He waited 9 hours but was never seen and ultimately left.  He called the office and was added onto my schedule.  In light of his recent chest pain with some atypical features I scheduled him for a nuclear perfusion study which was done on March 13, 2018.  Ejection fraction was 55%.  He did not develop any chest pain or ECG changes.  He had normal perfusion without evidence for scar or ischemia.  He underwent recent laboratory which has shown a cholesterol 125, triglycerides 53, HDL 55, LDL 59 on his current medical regimen.  LP(a) was 62.9.   I saw him in September 2019 that time he had issues with both of his ARB therapy and in its place I started him on olmesartan 20 mg daily.  Since I saw him, he was seen on several occasions in the office Ruben [name] and underwent EP cardiac catheterization on September 29, 2020 he had presented with fatigue Ruben [name] of chest pain commencing on September 26, 2018.  Currently he was only taking 1/2 tablet of Plavix 75 mg prior to presentation due to easy bruisability.  He underwent repeat  catheterization by Ruben [name] which revealed overall stable native coronary and graft disease out significant change from his 2013 cardiac catheterization.  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  The previously placed ostial left main to distal left main stent was widely patent old occlusion of his circumflex marginal and vein graft was marginal vessel.  Since hospital discharge he denies any recurrent episodes of chest pain.  However, at times he still experiences episodic mild shortness of breath.  Nuys PND orthopnea.  He is unaware of any recurrent atrial fib or flutter.  Past he had significant mixed hyperlipidemia and has continued to stay on simvastatin 40 mg, 2 capsules twice a day niacin.  In the past we discussed discontinuance of niacin and he preferred to stay on this and has done well.  When I saw him in office follow-up in February 2020 he was mildly hypertensive and with his echo documentation of grade 2 diastolic dysfunction I recommended the addition of low-dose spironolactone at 12.5 mg daily.  He has tolerated this well.   I evaluated him in a telemedicine visit on Feb 12, 2019.  At that time his blood pressure was stable typically was running in the 110 -481 range systolically. He saw Dr. Elyse Hsu on December 04, 2018.  Repeat laboratory showed a total cholesterol 123, triglycerides 70, HDL 52, LDL 60.  He denies any recurrent episodes of chest tightness.  He exercises daily and basically exercises for at least 180 minutes/week typically on the treadmill.  He usually walks at  2.8 mph and 4% grade.  He denies any shortness of breath with the exception of one occasion when he was walking up a steep hill but this ultimately subsided.  He continues to be on Onglyza and p.o. glitazone for his diabetes mellitus followed closely by Dr. all-time her.  When I reviewed Dr. Alzheimer's laboratory his creatinine was 1.50, K 5.3.  He continued to be on Lyrica and Nucynta  for his peripheral neuropathy.     I last saw him in March 2021.  Since his May 2020 evaluation he has continued to do well.  He denies any recurrent anginal symptomatology.  He sees Dr. Elyse Hsu regularly.  Most recent laboratory December 01, 2019 showed a total cholesterol 112, HDL 49, LDL 50 and triglycerides 73.  He continues to be on rosuvastatin 40 mg, Lovaza 2 capsules daily in addition to niacin 1000 mg.  His blood pressure has been fairly well controlled on his medical regimen consisting of carvedilol, isosorbide and spironolactone.  He continues to be on ranolazine.  He is diabetic on  Onglyza and Actos.   Since I last saw him he has continued to do well.  He has been keeping busy doing projects at home.  He typically walks 2 times per week for 50 minutes.  Recently, when he does these extended projects, he seems to have to rest in 3 to 4 hours.  Niacin was stopped 4 months ago.  He has not had any classic anginal symptoms.  He required removal of cerumen by Dr. Radene Journey due to significant wax buildup but this did not affect his hearing.  He had undergone recent laboratory in March 2022 which showed a total cholesterol 114, HDL 40, LDL 58, triglycerides 73.  Creatinine was 0.87.  Potassium 4.3.  He had normal LFTs.  He presents for a 41-monthfollow-up evaluation.   Past Surgical History:  Procedure Laterality Date  . A-FLUTTER ABLATION N/A 02/05/2017   Procedure: A-Flutter Ablation;  Surgeon: AThompson Grayer MD;  [facility];  Service: Cardiovascular;  Laterality: N/A;  . CARDIAC CATHETERIZATION  12/13/2011   No intervention - recommend medical therapy with increased medical trial  . CARDIOVASCULAR STRESS TEST  10/26/2010   No scintigraphic evidence of inducible myocardial ischemia. No ECG changes. EKG negative for ischemia.  . CORONARY ARTERY BYPASS GRAFT  1997   LIMA-LAD, vein graft to diagonal, vein graft to obtuse marginal, and vein graft to RCA  . LEFT HEART CATH AND CORS/GRAFTS ANGIOGRAPHY N/A  09/29/2018   Procedure: LEFT HEART CATH AND CORS/GRAFTS ANGIOGRAPHY;  Surgeon: HLeonie Man MD;  [facility];  Service: Cardiovascular;  Laterality: N/A;  . LEFT HEART CATHETERIZATION WITH CORONARY/GRAFT ANGIOGRAM N/A 12/13/2011   Procedure: LEFT HEART CATHETERIZATION  WITH Beatrix Fetters;  Surgeon: Troy Sine, MD;  Location: [facility] CATH LAB;  Service: Cardiovascular;  Laterality: N/A;  . LOWER EXTREMITY ARTERIAL DOPPLER  01/30/2000   Normal arterial study  . TEE WITHOUT CARDIOVERSION N/A 02/04/2017   Procedure: TRANSESOPHAGEAL ECHOCARDIOGRAM (TEE);  Surgeon: Dorothy Spark, MD;  Location: [facility] ENDOSCOPY;  Service: Cardiovascular;  Laterality: N/A;  . TRANSTHORACIC ECHOCARDIOGRAM  12/12/2011   EF >55%, LA severely dilated.  Marland Kitchen ULTRASOUND GUIDANCE FOR VASCULAR ACCESS  09/29/2018   Procedure: Ultrasound Guidance For Vascular Access;  Surgeon: Leonie Man, MD;  Location: [facility];  Service: Cardiovascular;;    Allergies  Allergen Reactions  . Nortriptyline Other (See Comments)    Groggy at low dose  . Cymbalta [Duloxetine Hcl] Diarrhea    Current Outpatient Medications  Medication Sig Dispense Refill  . Ascorbic Acid (VITAMIN C PO) Take 1 tablet by mouth daily.    Marland Kitchen aspirin EC 81 MG tablet Take 1 tablet (81 mg total) by mouth daily. 90 tablet 3  . Black Pepper-Turmeric (TURMERIC CURCUMIN) 01-999 MG CAPS 1 capsule as needed for acid    . Cholecalciferol (VITAMIN D3) 5000 units CAPS Take 5,000-10,000 Units by mouth See admin instructions. Take 5,000 units by mouth once a day and rotate with 10,000 units every other day    . Coenzyme Q10 (CO Q-10 PO) Take 2 capsules by mouth daily.    Marland Kitchen FERROUS GLUCONATE-C-FOLIC ACID PO 1 capsule    . L-Methylfolate-B6-B12 (METANX PO) Take 1 tablet by mouth daily.     . nitroGLYCERIN (NITROLINGUAL) 0.4 MG/SPRAY spray Place 1 spray under the tongue every 5 (five) minutes x 3 doses as needed for chest pain. 12 g 11  .  NUCYNTA ER 200 MG TB12 Take 200 mg by mouth 2 (two) times daily.  0  . omega-3 acid ethyl esters (LOVAZA) 1 g capsule TAKE 2 CAPSULES BY MOUTH EVERY DAY 180 capsule 0  . OVER THE COUNTER MEDICATION Take 5-6 drops by mouth See admin instructions. CBD oil for pain relief    . OVER THE COUNTER MEDICATION Take 5 mLs by mouth See admin instructions. Take 5 ml Tumeric by mouth as need for acid    . pioglitazone (ACTOS) 45 MG tablet Take 45 mg by mouth daily.    . pregabalin (LYRICA) 200 MG capsule Take 200 mg by mouth 3 (three) times daily.     . Quercetin 250 MG TABS Take 500 mg by mouth 2 (two) times daily.    . saxagliptin HCl (ONGLYZA) 5 MG TABS tablet Take 5 mg by mouth daily.    . vitamin E 180 MG (400 UNITS) capsule Take 400 Units by mouth daily.    . carvedilol (COREG) 6.25 MG tablet TAKE 1 TABLET(6.25 MG) BY MOUTH TWICE DAILY WITH A MEAL 180 tablet 3  . clopidogrel (PLAVIX) 75 MG tablet Take 1 tablet (75 mg total) by mouth daily. 90 tablet 3  . isosorbide mononitrate (IMDUR) 60 MG 24 hr tablet TAKE 1 TABLET(60 MG) BY MOUTH DAILY 90 tablet 3  . losartan (COZAAR) 25 MG tablet TAKE 1 TABLET(25 MG) BY MOUTH DAILY 90 tablet 3  . oxyCODONE-acetaminophen (PERCOCET/ROXICET) 5-325 MG tablet Take 1 tablet by mouth daily as needed for moderate pain.  (Patient not taking: Reported on 02/16/2021)    . ranolazine (RANEXA) 1000 MG SR tablet TAKE 1 TABLET BY MOUTH TWICE DAILY 180 tablet 3  . rosuvastatin (CRESTOR) 40 MG tablet Take 1 tablet (40  mg total) by mouth daily. 90 tablet 3  . spironolactone (ALDACTONE) 25 MG tablet Take 0.5 tablets (12.5 mg total) by mouth daily. 45 tablet 3   No current facility-administered medications for this visit.    Social History   Socioeconomic History  . Marital status: Married    Spouse name: Not on file  . Number of children: Not on file  . Years of education: Not on file  . Highest education level: Not on file  Occupational History  . Not on file  Tobacco Use   . Smoking status: Never Smoker  . Smokeless tobacco: Never Used  Substance and Sexual Activity  . Alcohol use: Yes  . Drug use: Yes  . Sexual activity: Not on file  Other Topics Concern  . Not on file  Social History Narrative  . Not on file   Social Determinants of Health   Financial Resource Strain: Not on file  Food Insecurity: Not on file  Transportation Needs: Not on file  Physical Activity: Not on file  Stress: Not on file  Social Connections: Not on file  Intimate Partner Violence: Not on file    Family History  Problem Relation Age of Onset  . Heart disease Father   . Diabetes Father   . Hyperlipidemia Sister    Socially he is married. His former wife  was Dory Larsen, an iinsurance agent. He has been married for several years with his present wife and has 2 stepchildren with her. He does exercise least 4-5 days per week. He has now retired. Previously he was an Optometrist for [employer].   ROS General: Negative; No fevers, chills, or night sweats;  HEENT: Recent removal of significant buildup of cerumen in his ears; No changes in vision or hearing, sinus congestion, difficulty swallowing Pulmonary: Negative; No cough, wheezing, shortness of breath, hemoptysis Cardiovascular: See HPI GI: Negative; No nausea, vomiting, diarrhea, or abdominal pain GU: Negative; No dysuria, hematuria, or difficulty voiding Musculoskeletal: Negative; no myalgias, joint pain, or weakness Hematologic/Oncology: Negative; no easy bruising, bleeding Endocrine: Positive for diabetes mellitus, type II; no heat/cold intolerance;  Neuro: Positive for peripheral neuropathy; no changes in balance, headaches Skin: Negative; No rashes or skin lesions Psychiatric: Negative; No behavioral problems, depression Sleep: Negative; No snoring, daytime sleepiness, hypersomnolence, bruxism, restless legs, hypnogognic hallucinations, no cataplexy Other comprehensive 14 point system review is  negative.  PE BP 114/66   Pulse (!) 50   Ht 5' 10"  (1.778 m)   Wt 208 lb (94.3 kg)   SpO2 92%   BMI 29.84 kg/m    Repeat blood pressure by me 132/70  Wt Readings from Last 3 Encounters:  02/16/21 208 lb (94.3 kg)  12/16/19 210 lb (95.3 kg)  02/12/19 212 lb (96.2 kg)   General: Alert, oriented, no distress.  Skin: normal turgor, no rashes, warm and dry HEENT: Normocephalic, atraumatic. Pupils equal round and reactive to light; sclera anicteric; extraocular muscles intact; Nose without nasal septal hypertrophy Mouth/Parynx benign; Mallinpatti scale 3 Neck: No JVD, no carotid bruits; normal carotid upstroke Lungs: clear to ausculatation and percussion; no wheezing or rales Chest wall: without tenderness to palpitation Heart: PMI not displaced, RRR, s1 s2 normal, 1/6 systolic murmur, no diastolic murmur, no rubs, gallops, thrills, or heaves Abdomen: soft, nontender; no hepatosplenomehaly, BS+; abdominal aorta nontender and not dilated by palpation. Back: no CVA tenderness Pulses 2+ Musculoskeletal: full range of motion, normal strength, no joint deformities Extremities: no clubbing cyanosis or edema, Homan's sign negative  Neurologic: grossly nonfocal; Cranial nerves grossly wnl Psychologic: Normal mood and affect   ECG (independently read by me): Sinus bradycardia at 50, first-degree AV block.  March 24, 2021ECG (independently read by me): Sinus bradycardia 53 bpm, first-degree AV block with.  Seconds.  Left axis deviation.  No significant ST changes.  QTc interval 429 ms.PR 280 msec  February 2020 ECG (independently read by me): Sinus rhythm at 62 bpm with first-degree AV block..  PR 248 msecms.  No significant ST changes.  September 2019 ECG (independently read by me): Sinus bradycardia at 50; 1st degree AV block; PR 276 msec  June 2019 ECG (independently read by me): From March 04, 2018 at the emergency room/urgent care: Normal sinus rhythm at 60 bpm with first-degree AV  block.  PR interval 258 ms.  Left axis deviation.  Poor R wave progression V1 through V3.  No acute abnormalities.  March 2019 ECG (independently read by me): sinus bradycardia 54 bpm.  First degree AV block.  Nonspecific T waves.  PR interval 268 ms  September 2018 ECG (independently read by me): Sinus bradycardia at 55 bpm with first-degree AV block.  PR interval 264 ms.  Nonspecific T changes inferiorly.  November 2017 ECG (independently read by me): Sinus bradycardia at 59 bpm.  First degree AV block with a PR interval at 23 ms.  Incomplete right bundle branch block.  No significant ST segment changes.  September 2015 ECG (independently read by me): Sinus bradycardia 55 beats per minute.  First degree AV block with PR interval 258 ms.  Mild RV conduction delay.  Nonspecific T changes.  Prior 07/13/2013 ECG: Sinus bradycardia 54 beats per minute with first-degree AV block. Nonspecific T changes.  LABS: BMP Latest Ref Rng & Units 11/20/2018 09/28/2018 09/27/2018  Glucose 65 - 99 mg/dL 125(H) 77 106(H)  BUN 8 - 27 mg/dL 11 9 8   Creatinine 0.76 - 1.27 mg/dL 0.92 0.95 0.99  BUN/Creat Ratio 10 - 24 12 - -  Sodium 134 - 144 mmol/L 135 137 137  Potassium 3.5 - 5.2 mmol/L 5.0 3.8 3.9  Chloride 96 - 106 mmol/L 100 105 105  CO2 20 - 29 mmol/L 29 26 26   Calcium 8.6 - 10.2 mg/dL 9.5 8.8(L) 9.1   Hepatic Function Latest Ref Rng & Units 06/11/2018 03/04/2018 06/27/2017  Total Protein 6.0 - 8.5 g/dL 6.2 6.7 6.5  Albumin 3.5 - 4.8 g/dL 4.4 4.3 4.5  AST 0 - 40 IU/L 24 40 29  ALT 0 - 44 IU/L 20 25 24   Alk Phosphatase 39 - 117 IU/L 91 93 100  Total Bilirubin 0.0 - 1.2 mg/dL 0.9 1.3(H) 1.2   CBC Latest Ref Rng & Units 10/15/2018 09/30/2018 09/29/2018  WBC 3.4 - 10.8 x10E3/uL 4.2 4.7 4.5  Hemoglobin 13.0 - 17.7 g/dL 13.5 12.2(L) 12.5(L)  Hematocrit 37.5 - 51.0 % 40.4 35.7(L) 37.7(L)  Platelets 150 - 450 x10E3/uL 184 117(L) 123(L)   Lab Results  Component Value Date   MCV 95 10/15/2018   MCV 94.2 09/30/2018    MCV 93.8 09/29/2018   Lab Results  Component Value Date   TSH 1.870 06/11/2018   Lab Results  Component Value Date   HGBA1C 5.5 06/27/2017    Lipid Panel     Component Value Date/Time   CHOL 110 09/28/2018 0046   CHOL 125 06/11/2018 0819   CHOL 127 11/09/2015 0854   CHOL 107 02/12/2014 0801   TRIG 37 09/28/2018 0046   TRIG 57 11/09/2015 0854  TRIG 37 02/12/2014 0801   HDL 51 09/28/2018 0046   HDL 55 06/11/2018 0819   HDL 55 11/09/2015 0854   HDL 57 02/12/2014 0801   CHOLHDL 2.2 09/28/2018 0046   VLDL 7 09/28/2018 0046   LDLCALC 52 09/28/2018 0046   LDLCALC 59 06/11/2018 0819   LDLCALC 61 11/09/2015 0854   LDLCALC 43 02/12/2014 0801    RADIOLOGY: No results found.  IMPRESSION:  1. Coronary artery disease involving native coronary artery of native heart without angina pectoris   2. Hx of CABG   3. Mixed hyperlipidemia   4. Type 2 diabetes mellitus with diabetic neuropathy, without long-term current use of insulin ([place])   5. Peripheral polyneuropathy     ASSESSMENT AND PLAN: Mr. Osmel Dykstra is a 75 year old white male who underwent CABG revascularization surgery in 1997 at age 45; and is status post initial stenting of the graft to the circumflex vessel in 2004. In December 2009 he underwent stenting of his left main into the superior ramus branch and stenting to the vein graft supplying the RCA. The graft to the circumflex was occluded  At catheterization in March 2013 the stent to the left main/ramus vessel was widely patent as was the stent to the vein graft to the right coronary artery. His proximal LAD was being supplied by his diagonal vessel and the mid LAD wassupplied by the LIMA graft.  His nuclear perfusion study  in March 2015 showed normal perfusion without scar or ischemia on his medical therapy.  He developed atrial flutter in 2018 which was isthmus dependent right atrial flutter and was successfully ablated.  He was on eliquis anticoagulation for one  month. When I  saw him, he had been on aspirin alone and with his left main stent I also recommended resumption of Plavix.  He has been stable without significant previous anginal symptoms on a medical regimen consisting of carvedilol 6.25 mg twice a day, isosorbide 30 mg, ranolazine 1000 mg twice a day in addition to his losartan.  Cardiac catheterization findings in January 2020 were not significantly changed from 2013 evaluation. The left main stent is widely patent.  Presently, he continues to do fairly well without any anginal symptoms.  He is walking 2 times per week for at least 50 minutes.  He has been involved doing somewhat strenuous physical projects at home with house repair.  His blood pressure today is stable on carvedilol 6.25 mg twice a day, losartan 25 mg daily, spironolactone 12.5 mg and he continues to be on ranolazine 1000 mg twice a day and isosorbide 60 mg daily.  For his mixed hyperlipidemia he is on low vase a 2 capsules daily as well as rosuvastatin 40 mg.  He is on DAPT indefinitely particularly with left main stent.  He is tolerating this well without bleeding.  He is diabetic on Actos and Onglyza.  Dr. Elyse Hsu has recently retired and he will need to establish with a new endocrinologist.  He continues to have peripheral neuropathy which has improved with Lyrica in addition to Nucynta extended release.  I recommended he continue current therapy.  He is now 25 years since CABG revascularization surgery.  I have recommended he undergo follow-up Lexiscan Myoview study in July/August for follow-up ischemic evaluation particularly since he does have to stop after several hours of doing his projects due to more fatigue.  I will see him in 6 months for reevaluation or sooner as needed.   Troy Sine, MD, [facility]  02/18/2021 4:11 PM

## 2021-02-16 NOTE — Patient Instructions (Addendum)
Medication Instructions:  Your Physician recommend you continue on your current medication as directed.    *If you need a refill on your cardiac medications before your next appointment, please call your pharmacy*   Lab Work: None ordered today   Testing/Procedures: Your physician has requested that you have a lexiscan Myoview in July or August. For further information please visit [email or web address]. Please follow instruction sheet, as given. [place]   Follow-Up: At [facility], you and your health needs are our priority.  As part of our continuing mission to provide you with exceptional heart care, we have created designated Provider Care Teams.  These Care Teams include your primary Cardiologist (physician) and Advanced Practice Providers (APPs -  Physician Assistants and Nurse Practitioners) who all work together to provide you with the care you need, when you need it.  We recommend signing up for the patient portal called "MyChart".  Sign up information is provided on this After Visit Summary.  MyChart is used to connect with patients for Virtual Visits (Telemedicine).  Patients are able to view lab/test results, encounter notes, upcoming appointments, etc.  Non-urgent messages can be sent to your provider as well.   To learn more about what you can do with MyChart, go to [email or web address].    Your next appointment:   6 month(s)  The format for your next appointment:   In Person  Provider:   Shelva Majestic, MD  You are scheduled for a Myocardial Perfusion Imaging Study.  Please arrive 15 minutes prior to your appointment time for registration and insurance purposes.  The test will take approximately 3 to 4 hours to complete; you may bring reading material.  If someone comes with you to your appointment, they will need to remain in the main lobby due to limited space in the testing area. **If you are pregnant or breastfeeding, please notify the  nuclear lab prior to your appointment**  How to prepare for your Myocardial Perfusion Test: . Do not eat or drink 3 hours prior to your test, except you may have water. . Do not consume products containing caffeine (regular or decaffeinated) 12 hours prior to your test. (ex: coffee, chocolate, sodas, tea). . Do bring a list of your current medications with you.  If not listed below, you may take your medications as normal. . Do wear comfortable clothes (no dresses or overalls) and walking shoes, tennis shoes preferred (No heels or open toe shoes are allowed). . Do NOT wear cologne, perfume, aftershave, or lotions (deodorant is allowed). . If these instructions are not followed, your test will have to be rescheduled.  Please report to [place], [address] for your test.  If you have questions or concerns about your appointment, you can call the Nuclear Lab at [phone].  If you cannot keep your appointment, please provide 24 hours notification to the Nuclear Lab, to avoid a possible $50 charge to your account.

## 2021-02-18 ENCOUNTER — Encounter: Payer: Self-pay | Admitting: Cardiovascular Disease

## 2021-02-23 DIAGNOSIS — M216X1 Other acquired deformities of right foot: Secondary | ICD-10-CM | POA: Diagnosis not present

## 2021-02-23 DIAGNOSIS — E1140 Type 2 diabetes mellitus with diabetic neuropathy, unspecified: Secondary | ICD-10-CM | POA: Diagnosis not present

## 2021-02-23 DIAGNOSIS — I70293 Other atherosclerosis of native arteries of extremities, bilateral legs: Secondary | ICD-10-CM | POA: Diagnosis not present

## 2021-02-23 DIAGNOSIS — M216X2 Other acquired deformities of left foot: Secondary | ICD-10-CM | POA: Diagnosis not present

## 2021-02-25 ENCOUNTER — Other Ambulatory Visit: Payer: Self-pay | Admitting: Cardiovascular Disease

## 2021-03-03 ENCOUNTER — Other Ambulatory Visit: Payer: Self-pay | Admitting: Cardiovascular Disease

## 2021-03-09 DIAGNOSIS — I739 Peripheral vascular disease, unspecified: Secondary | ICD-10-CM | POA: Diagnosis not present

## 2021-03-29 DIAGNOSIS — M5136 Other intervertebral disc degeneration, lumbar region: Secondary | ICD-10-CM | POA: Diagnosis not present

## 2021-03-29 DIAGNOSIS — M9902 Segmental and somatic dysfunction of thoracic region: Secondary | ICD-10-CM | POA: Diagnosis not present

## 2021-03-29 DIAGNOSIS — M9901 Segmental and somatic dysfunction of cervical region: Secondary | ICD-10-CM | POA: Diagnosis not present

## 2021-03-29 DIAGNOSIS — M9903 Segmental and somatic dysfunction of lumbar region: Secondary | ICD-10-CM | POA: Diagnosis not present

## 2021-03-29 DIAGNOSIS — M9904 Segmental and somatic dysfunction of sacral region: Secondary | ICD-10-CM | POA: Diagnosis not present

## 2021-03-29 MED ORDER — OMEGA-3-ACID ETHYL ESTERS 1 G PO CAPS: 2.0000 | ORAL_CAPSULE | Freq: Every day | ORAL | 3 refills | Status: DC

## 2021-04-03 DIAGNOSIS — M5136 Other intervertebral disc degeneration, lumbar region: Secondary | ICD-10-CM | POA: Diagnosis not present

## 2021-04-03 DIAGNOSIS — M9902 Segmental and somatic dysfunction of thoracic region: Secondary | ICD-10-CM | POA: Diagnosis not present

## 2021-04-03 DIAGNOSIS — M9903 Segmental and somatic dysfunction of lumbar region: Secondary | ICD-10-CM | POA: Diagnosis not present

## 2021-04-03 DIAGNOSIS — M9901 Segmental and somatic dysfunction of cervical region: Secondary | ICD-10-CM | POA: Diagnosis not present

## 2021-04-03 DIAGNOSIS — M9904 Segmental and somatic dysfunction of sacral region: Secondary | ICD-10-CM | POA: Diagnosis not present

## 2021-04-19 ENCOUNTER — Telehealth (HOSPITAL_COMMUNITY): Payer: Self-pay | Admitting: *Deleted

## 2021-04-19 NOTE — Telephone Encounter (Signed)
Close encounter 

## 2021-04-20 ENCOUNTER — Ambulatory Visit (HOSPITAL_COMMUNITY)
Admission: RE | Admit: 2021-04-20 | Discharge: 2021-04-20 | Disposition: A | Discharge status: Home / Self Care | Payer: Medicare Other | Source: Ambulatory Visit | Attending: Cardiology | Admitting: Cardiology

## 2021-04-20 ENCOUNTER — Other Ambulatory Visit: Payer: Self-pay

## 2021-04-20 DIAGNOSIS — M79671 Pain in right foot: Secondary | ICD-10-CM | POA: Diagnosis not present

## 2021-04-20 DIAGNOSIS — Z951 Presence of aortocoronary bypass graft: Secondary | ICD-10-CM | POA: Insufficient documentation

## 2021-04-20 DIAGNOSIS — M79641 Pain in right hand: Secondary | ICD-10-CM | POA: Diagnosis not present

## 2021-04-20 DIAGNOSIS — Z79899 Other long term (current) drug therapy: Secondary | ICD-10-CM | POA: Diagnosis not present

## 2021-04-20 DIAGNOSIS — M79672 Pain in left foot: Secondary | ICD-10-CM | POA: Diagnosis not present

## 2021-04-20 DIAGNOSIS — G8929 Other chronic pain: Secondary | ICD-10-CM | POA: Diagnosis not present

## 2021-04-20 DIAGNOSIS — G609 Hereditary and idiopathic neuropathy, unspecified: Secondary | ICD-10-CM | POA: Diagnosis not present

## 2021-04-20 DIAGNOSIS — G894 Chronic pain syndrome: Secondary | ICD-10-CM | POA: Diagnosis not present

## 2021-04-20 DIAGNOSIS — E1142 Type 2 diabetes mellitus with diabetic polyneuropathy: Secondary | ICD-10-CM | POA: Diagnosis not present

## 2021-04-20 DIAGNOSIS — Z5181 Encounter for therapeutic drug level monitoring: Secondary | ICD-10-CM | POA: Diagnosis not present

## 2021-04-20 LAB — MYOCARDIAL PERFUSION IMAGING
LV dias vol: 172 mL (ref 62–150)
LV sys vol: 64 mL
Peak HR: 60 {beats}/min
Rest HR: 50 {beats}/min
SDS: 3
SRS: 0
SSS: 3
TID: 0.97

## 2021-04-20 MED ORDER — TECHNETIUM TC 99M TETROFOSMIN IV KIT
31.8000 | PACK | Freq: Once | INTRAVENOUS | Status: AC | PRN
  Administered 2021-04-20: 31.8 via INTRAVENOUS
  Filled 2021-04-20: qty 32

## 2021-04-20 MED ORDER — REGADENOSON 0.4 MG/5ML IV SOLN
0.4000 mg | Freq: Once | INTRAVENOUS | Status: AC
  Administered 2021-04-20: 0.4 mg via INTRAVENOUS

## 2021-04-20 MED ORDER — TECHNETIUM TC 99M TETROFOSMIN IV KIT
10.6000 | PACK | Freq: Once | INTRAVENOUS | Status: AC | PRN
  Administered 2021-04-20: 10.6 via INTRAVENOUS
  Filled 2021-04-20: qty 11

## 2021-06-14 DIAGNOSIS — M9901 Segmental and somatic dysfunction of cervical region: Secondary | ICD-10-CM | POA: Diagnosis not present

## 2021-06-14 DIAGNOSIS — M5136 Other intervertebral disc degeneration, lumbar region: Secondary | ICD-10-CM | POA: Diagnosis not present

## 2021-06-14 DIAGNOSIS — M9902 Segmental and somatic dysfunction of thoracic region: Secondary | ICD-10-CM | POA: Diagnosis not present

## 2021-06-14 DIAGNOSIS — M9903 Segmental and somatic dysfunction of lumbar region: Secondary | ICD-10-CM | POA: Diagnosis not present

## 2021-06-14 DIAGNOSIS — M9904 Segmental and somatic dysfunction of sacral region: Secondary | ICD-10-CM | POA: Diagnosis not present

## 2021-06-15 DIAGNOSIS — Z23 Encounter for immunization: Secondary | ICD-10-CM | POA: Diagnosis not present

## 2021-06-16 ENCOUNTER — Other Ambulatory Visit: Payer: Self-pay | Admitting: Cardiovascular Disease

## 2021-06-16 DIAGNOSIS — M9901 Segmental and somatic dysfunction of cervical region: Secondary | ICD-10-CM | POA: Diagnosis not present

## 2021-06-16 DIAGNOSIS — M9904 Segmental and somatic dysfunction of sacral region: Secondary | ICD-10-CM | POA: Diagnosis not present

## 2021-06-16 DIAGNOSIS — M9902 Segmental and somatic dysfunction of thoracic region: Secondary | ICD-10-CM | POA: Diagnosis not present

## 2021-06-16 DIAGNOSIS — M5136 Other intervertebral disc degeneration, lumbar region: Secondary | ICD-10-CM | POA: Diagnosis not present

## 2021-06-16 DIAGNOSIS — M9903 Segmental and somatic dysfunction of lumbar region: Secondary | ICD-10-CM | POA: Diagnosis not present

## 2021-07-06 DIAGNOSIS — M79641 Pain in right hand: Secondary | ICD-10-CM | POA: Diagnosis not present

## 2021-07-06 DIAGNOSIS — M79672 Pain in left foot: Secondary | ICD-10-CM | POA: Diagnosis not present

## 2021-07-06 DIAGNOSIS — E1142 Type 2 diabetes mellitus with diabetic polyneuropathy: Secondary | ICD-10-CM | POA: Diagnosis not present

## 2021-07-06 DIAGNOSIS — G8929 Other chronic pain: Secondary | ICD-10-CM | POA: Diagnosis not present

## 2021-07-06 DIAGNOSIS — M79671 Pain in right foot: Secondary | ICD-10-CM | POA: Diagnosis not present

## 2021-07-06 DIAGNOSIS — G894 Chronic pain syndrome: Secondary | ICD-10-CM | POA: Diagnosis not present

## 2021-07-06 DIAGNOSIS — G609 Hereditary and idiopathic neuropathy, unspecified: Secondary | ICD-10-CM | POA: Diagnosis not present

## 2021-07-26 DIAGNOSIS — M2042 Other hammer toe(s) (acquired), left foot: Secondary | ICD-10-CM | POA: Diagnosis not present

## 2021-07-26 DIAGNOSIS — M2041 Other hammer toe(s) (acquired), right foot: Secondary | ICD-10-CM | POA: Diagnosis not present

## 2021-07-26 DIAGNOSIS — M79671 Pain in right foot: Secondary | ICD-10-CM | POA: Diagnosis not present

## 2021-07-26 DIAGNOSIS — R2689 Other abnormalities of gait and mobility: Secondary | ICD-10-CM | POA: Diagnosis not present

## 2021-07-26 DIAGNOSIS — M79672 Pain in left foot: Secondary | ICD-10-CM | POA: Diagnosis not present

## 2021-07-26 DIAGNOSIS — E1142 Type 2 diabetes mellitus with diabetic polyneuropathy: Secondary | ICD-10-CM | POA: Diagnosis not present

## 2021-07-31 DIAGNOSIS — Z20822 Contact with and (suspected) exposure to covid-19: Secondary | ICD-10-CM | POA: Diagnosis not present

## 2021-08-08 DIAGNOSIS — H2513 Age-related nuclear cataract, bilateral: Secondary | ICD-10-CM | POA: Diagnosis not present

## 2021-08-08 DIAGNOSIS — H52203 Unspecified astigmatism, bilateral: Secondary | ICD-10-CM | POA: Diagnosis not present

## 2021-08-08 DIAGNOSIS — H11002 Unspecified pterygium of left eye: Secondary | ICD-10-CM | POA: Diagnosis not present

## 2021-08-08 DIAGNOSIS — E119 Type 2 diabetes mellitus without complications: Secondary | ICD-10-CM | POA: Diagnosis not present

## 2021-09-14 DIAGNOSIS — E1142 Type 2 diabetes mellitus with diabetic polyneuropathy: Secondary | ICD-10-CM | POA: Diagnosis not present

## 2021-09-14 DIAGNOSIS — M79672 Pain in left foot: Secondary | ICD-10-CM | POA: Diagnosis not present

## 2021-09-14 DIAGNOSIS — Z5181 Encounter for therapeutic drug level monitoring: Secondary | ICD-10-CM | POA: Diagnosis not present

## 2021-09-14 DIAGNOSIS — Z79899 Other long term (current) drug therapy: Secondary | ICD-10-CM | POA: Diagnosis not present

## 2021-09-14 DIAGNOSIS — M79671 Pain in right foot: Secondary | ICD-10-CM | POA: Diagnosis not present

## 2021-09-14 DIAGNOSIS — G609 Hereditary and idiopathic neuropathy, unspecified: Secondary | ICD-10-CM | POA: Diagnosis not present

## 2021-09-14 DIAGNOSIS — M79641 Pain in right hand: Secondary | ICD-10-CM | POA: Diagnosis not present

## 2021-09-20 DIAGNOSIS — Z20822 Contact with and (suspected) exposure to covid-19: Secondary | ICD-10-CM | POA: Diagnosis not present

## 2021-10-13 DIAGNOSIS — Z20822 Contact with and (suspected) exposure to covid-19: Secondary | ICD-10-CM | POA: Diagnosis not present

## 2021-10-25 DIAGNOSIS — M5136 Other intervertebral disc degeneration, lumbar region: Secondary | ICD-10-CM | POA: Diagnosis not present

## 2021-10-25 DIAGNOSIS — M9901 Segmental and somatic dysfunction of cervical region: Secondary | ICD-10-CM | POA: Diagnosis not present

## 2021-10-25 DIAGNOSIS — M9904 Segmental and somatic dysfunction of sacral region: Secondary | ICD-10-CM | POA: Diagnosis not present

## 2021-10-25 DIAGNOSIS — M9902 Segmental and somatic dysfunction of thoracic region: Secondary | ICD-10-CM | POA: Diagnosis not present

## 2021-10-25 DIAGNOSIS — M9903 Segmental and somatic dysfunction of lumbar region: Secondary | ICD-10-CM | POA: Diagnosis not present

## 2021-11-27 DIAGNOSIS — Z20822 Contact with and (suspected) exposure to covid-19: Secondary | ICD-10-CM | POA: Diagnosis not present

## 2021-12-08 DIAGNOSIS — E669 Obesity, unspecified: Secondary | ICD-10-CM | POA: Diagnosis not present

## 2021-12-08 DIAGNOSIS — Z951 Presence of aortocoronary bypass graft: Secondary | ICD-10-CM | POA: Diagnosis not present

## 2021-12-08 DIAGNOSIS — E1159 Type 2 diabetes mellitus with other circulatory complications: Secondary | ICD-10-CM | POA: Diagnosis not present

## 2021-12-08 DIAGNOSIS — E1142 Type 2 diabetes mellitus with diabetic polyneuropathy: Secondary | ICD-10-CM | POA: Diagnosis not present

## 2021-12-08 DIAGNOSIS — Z7984 Long term (current) use of oral hypoglycemic drugs: Secondary | ICD-10-CM | POA: Diagnosis not present

## 2021-12-08 DIAGNOSIS — I152 Hypertension secondary to endocrine disorders: Secondary | ICD-10-CM | POA: Diagnosis not present

## 2021-12-08 DIAGNOSIS — E1169 Type 2 diabetes mellitus with other specified complication: Secondary | ICD-10-CM | POA: Diagnosis not present

## 2021-12-08 DIAGNOSIS — Z6830 Body mass index (BMI) 30.0-30.9, adult: Secondary | ICD-10-CM | POA: Diagnosis not present

## 2021-12-12 DIAGNOSIS — I10 Essential (primary) hypertension: Secondary | ICD-10-CM | POA: Diagnosis not present

## 2021-12-12 DIAGNOSIS — E559 Vitamin D deficiency, unspecified: Secondary | ICD-10-CM | POA: Diagnosis not present

## 2021-12-12 DIAGNOSIS — G609 Hereditary and idiopathic neuropathy, unspecified: Secondary | ICD-10-CM | POA: Diagnosis not present

## 2021-12-12 DIAGNOSIS — E1159 Type 2 diabetes mellitus with other circulatory complications: Secondary | ICD-10-CM | POA: Diagnosis not present

## 2021-12-12 DIAGNOSIS — E782 Mixed hyperlipidemia: Secondary | ICD-10-CM | POA: Diagnosis not present

## 2021-12-28 ENCOUNTER — Other Ambulatory Visit: Payer: Self-pay | Admitting: Cardiovascular Disease

## 2021-12-28 DIAGNOSIS — Z20828 Contact with and (suspected) exposure to other viral communicable diseases: Secondary | ICD-10-CM | POA: Diagnosis not present

## 2022-01-02 DIAGNOSIS — Z20822 Contact with and (suspected) exposure to covid-19: Secondary | ICD-10-CM | POA: Diagnosis not present

## 2022-01-08 DIAGNOSIS — Z20822 Contact with and (suspected) exposure to covid-19: Secondary | ICD-10-CM | POA: Diagnosis not present

## 2022-01-17 DIAGNOSIS — Z23 Encounter for immunization: Secondary | ICD-10-CM | POA: Diagnosis not present

## 2022-01-18 DIAGNOSIS — E1142 Type 2 diabetes mellitus with diabetic polyneuropathy: Secondary | ICD-10-CM | POA: Diagnosis not present

## 2022-01-18 DIAGNOSIS — G609 Hereditary and idiopathic neuropathy, unspecified: Secondary | ICD-10-CM | POA: Diagnosis not present

## 2022-01-18 DIAGNOSIS — M79671 Pain in right foot: Secondary | ICD-10-CM | POA: Diagnosis not present

## 2022-01-18 DIAGNOSIS — G894 Chronic pain syndrome: Secondary | ICD-10-CM | POA: Diagnosis not present

## 2022-01-18 DIAGNOSIS — M79641 Pain in right hand: Secondary | ICD-10-CM | POA: Diagnosis not present

## 2022-01-18 DIAGNOSIS — Z20822 Contact with and (suspected) exposure to covid-19: Secondary | ICD-10-CM | POA: Diagnosis not present

## 2022-01-18 DIAGNOSIS — M79672 Pain in left foot: Secondary | ICD-10-CM | POA: Diagnosis not present

## 2022-01-22 DIAGNOSIS — Z20822 Contact with and (suspected) exposure to covid-19: Secondary | ICD-10-CM | POA: Diagnosis not present

## 2022-01-23 DIAGNOSIS — Z20822 Contact with and (suspected) exposure to covid-19: Secondary | ICD-10-CM | POA: Diagnosis not present

## 2022-01-26 DIAGNOSIS — Z20822 Contact with and (suspected) exposure to covid-19: Secondary | ICD-10-CM | POA: Diagnosis not present

## 2022-01-29 DIAGNOSIS — Z20822 Contact with and (suspected) exposure to covid-19: Secondary | ICD-10-CM | POA: Diagnosis not present

## 2022-02-13 ENCOUNTER — Other Ambulatory Visit: Payer: Self-pay | Admitting: Cardiovascular Disease

## 2022-03-02 ENCOUNTER — Other Ambulatory Visit: Payer: Self-pay | Admitting: Cardiovascular Disease

## 2022-03-21 DIAGNOSIS — M9904 Segmental and somatic dysfunction of sacral region: Secondary | ICD-10-CM | POA: Diagnosis not present

## 2022-03-21 DIAGNOSIS — M5136 Other intervertebral disc degeneration, lumbar region: Secondary | ICD-10-CM | POA: Diagnosis not present

## 2022-03-21 DIAGNOSIS — M9903 Segmental and somatic dysfunction of lumbar region: Secondary | ICD-10-CM | POA: Diagnosis not present

## 2022-03-21 DIAGNOSIS — M9901 Segmental and somatic dysfunction of cervical region: Secondary | ICD-10-CM | POA: Diagnosis not present

## 2022-03-21 DIAGNOSIS — M9902 Segmental and somatic dysfunction of thoracic region: Secondary | ICD-10-CM | POA: Diagnosis not present

## 2022-03-23 DIAGNOSIS — M5136 Other intervertebral disc degeneration, lumbar region: Secondary | ICD-10-CM | POA: Diagnosis not present

## 2022-03-23 DIAGNOSIS — M9903 Segmental and somatic dysfunction of lumbar region: Secondary | ICD-10-CM | POA: Diagnosis not present

## 2022-03-23 DIAGNOSIS — M9902 Segmental and somatic dysfunction of thoracic region: Secondary | ICD-10-CM | POA: Diagnosis not present

## 2022-03-23 DIAGNOSIS — M9901 Segmental and somatic dysfunction of cervical region: Secondary | ICD-10-CM | POA: Diagnosis not present

## 2022-03-23 DIAGNOSIS — M9904 Segmental and somatic dysfunction of sacral region: Secondary | ICD-10-CM | POA: Diagnosis not present

## 2022-03-28 DIAGNOSIS — M9901 Segmental and somatic dysfunction of cervical region: Secondary | ICD-10-CM | POA: Diagnosis not present

## 2022-03-28 DIAGNOSIS — M9903 Segmental and somatic dysfunction of lumbar region: Secondary | ICD-10-CM | POA: Diagnosis not present

## 2022-03-28 DIAGNOSIS — M9904 Segmental and somatic dysfunction of sacral region: Secondary | ICD-10-CM | POA: Diagnosis not present

## 2022-03-28 DIAGNOSIS — M9902 Segmental and somatic dysfunction of thoracic region: Secondary | ICD-10-CM | POA: Diagnosis not present

## 2022-03-28 DIAGNOSIS — M5136 Other intervertebral disc degeneration, lumbar region: Secondary | ICD-10-CM | POA: Diagnosis not present

## 2022-03-29 ENCOUNTER — Other Ambulatory Visit: Payer: Self-pay | Admitting: Cardiovascular Disease

## 2022-04-23 ENCOUNTER — Other Ambulatory Visit: Payer: Self-pay | Admitting: Cardiovascular Disease

## 2022-05-10 DIAGNOSIS — Z125 Encounter for screening for malignant neoplasm of prostate: Secondary | ICD-10-CM | POA: Diagnosis not present

## 2022-05-10 DIAGNOSIS — Z0000 Encounter for general adult medical examination without abnormal findings: Secondary | ICD-10-CM | POA: Diagnosis not present

## 2022-05-10 DIAGNOSIS — I2510 Atherosclerotic heart disease of native coronary artery without angina pectoris: Secondary | ICD-10-CM | POA: Diagnosis not present

## 2022-05-10 DIAGNOSIS — E7800 Pure hypercholesterolemia, unspecified: Secondary | ICD-10-CM | POA: Diagnosis not present

## 2022-05-10 DIAGNOSIS — E1142 Type 2 diabetes mellitus with diabetic polyneuropathy: Secondary | ICD-10-CM | POA: Diagnosis not present

## 2022-05-10 DIAGNOSIS — Z79899 Other long term (current) drug therapy: Secondary | ICD-10-CM | POA: Diagnosis not present

## 2022-05-10 DIAGNOSIS — I10 Essential (primary) hypertension: Secondary | ICD-10-CM | POA: Diagnosis not present

## 2022-05-10 DIAGNOSIS — E782 Mixed hyperlipidemia: Secondary | ICD-10-CM | POA: Diagnosis not present

## 2022-05-10 DIAGNOSIS — G629 Polyneuropathy, unspecified: Secondary | ICD-10-CM | POA: Diagnosis not present

## 2022-05-10 DIAGNOSIS — E1169 Type 2 diabetes mellitus with other specified complication: Secondary | ICD-10-CM | POA: Diagnosis not present

## 2022-05-11 DIAGNOSIS — Z79899 Other long term (current) drug therapy: Secondary | ICD-10-CM | POA: Diagnosis not present

## 2022-05-11 DIAGNOSIS — Z5181 Encounter for therapeutic drug level monitoring: Secondary | ICD-10-CM | POA: Diagnosis not present

## 2022-05-11 DIAGNOSIS — G609 Hereditary and idiopathic neuropathy, unspecified: Secondary | ICD-10-CM | POA: Diagnosis not present

## 2022-05-11 DIAGNOSIS — G894 Chronic pain syndrome: Secondary | ICD-10-CM | POA: Diagnosis not present

## 2022-05-14 DIAGNOSIS — E113291 Type 2 diabetes mellitus with mild nonproliferative diabetic retinopathy without macular edema, right eye: Secondary | ICD-10-CM | POA: Diagnosis not present

## 2022-05-14 DIAGNOSIS — H52203 Unspecified astigmatism, bilateral: Secondary | ICD-10-CM | POA: Diagnosis not present

## 2022-05-14 DIAGNOSIS — H524 Presbyopia: Secondary | ICD-10-CM | POA: Diagnosis not present

## 2022-05-14 DIAGNOSIS — H25013 Cortical age-related cataract, bilateral: Secondary | ICD-10-CM | POA: Diagnosis not present

## 2022-05-14 DIAGNOSIS — H11002 Unspecified pterygium of left eye: Secondary | ICD-10-CM | POA: Diagnosis not present

## 2022-05-14 DIAGNOSIS — H2513 Age-related nuclear cataract, bilateral: Secondary | ICD-10-CM | POA: Diagnosis not present

## 2022-05-24 ENCOUNTER — Encounter: Payer: Self-pay | Admitting: Neurology

## 2022-06-22 DIAGNOSIS — Z23 Encounter for immunization: Secondary | ICD-10-CM | POA: Diagnosis not present

## 2022-06-24 ENCOUNTER — Other Ambulatory Visit: Payer: Self-pay | Admitting: Cardiovascular Disease

## 2022-06-25 ENCOUNTER — Other Ambulatory Visit: Payer: Self-pay | Admitting: Cardiovascular Disease

## 2022-07-02 DIAGNOSIS — M9901 Segmental and somatic dysfunction of cervical region: Secondary | ICD-10-CM | POA: Diagnosis not present

## 2022-07-02 DIAGNOSIS — M5136 Other intervertebral disc degeneration, lumbar region: Secondary | ICD-10-CM | POA: Diagnosis not present

## 2022-07-02 DIAGNOSIS — M9902 Segmental and somatic dysfunction of thoracic region: Secondary | ICD-10-CM | POA: Diagnosis not present

## 2022-07-02 DIAGNOSIS — M9904 Segmental and somatic dysfunction of sacral region: Secondary | ICD-10-CM | POA: Diagnosis not present

## 2022-07-02 DIAGNOSIS — M9903 Segmental and somatic dysfunction of lumbar region: Secondary | ICD-10-CM | POA: Diagnosis not present

## 2022-07-03 DIAGNOSIS — Z23 Encounter for immunization: Secondary | ICD-10-CM | POA: Diagnosis not present

## 2022-07-06 DIAGNOSIS — G609 Hereditary and idiopathic neuropathy, unspecified: Secondary | ICD-10-CM | POA: Diagnosis not present

## 2022-07-06 DIAGNOSIS — G894 Chronic pain syndrome: Secondary | ICD-10-CM | POA: Diagnosis not present

## 2022-07-24 NOTE — Progress Notes (Signed)
Initial neurology clinic note  SERVICE DATE: 07/27/22  Reason for Evaluation: Consultation requested by Drexel Iha, NP for an opinion regarding neuropathy.  My final recommendations will be communicated back to the requesting physician by way of shared medical record or letter to requesting physician via [place] mail.  HPI: This is [name], a 76 y.o. right-handed male with a medical history of chronic pain (on Nucynta), HTN, DM, CAD s/p CABG and PCI, spondylosis s/p C3-C7 fusion (~2000) who presents to neurology clinic with the chief complaint of neuropathy. The patient is alone today.  Patient has C3-C7 fusion for right arm symptoms around 2000. After surgery, he began noticing pain in bilateral feet. Patient saw neurology at [facility] many years ago. Had an EMG at [facility] in 2001 for progressive numbness and burning in feet s/p tarsal tunnel release in right foot that was consistent with PN (see report below - from care every where).  He current levels of pain are generally around 2/10. When he as flairs he has shooting electric pains and grabbing sensation in his calves. He has tingling in his legs. He has numbness and can have skin sensitivity. He gets a flair about once per month that will last overnight. He gets it back under control with sleep, hemp oil or an extra small dose of Lyrica or oxycodone.  He has had one fall this year. He tripped going up the stairs. Patient got a Psychologist, counselling from [facility] about new treatments for neuropathy (chiropractors). He went for 2 visits. They offered LED light treatment (patient had tried this before and it helped with balance), a treatment to wake up his nerves, and an anti-inflammatory diet. He was told they could improve his legs by 40% for $7000.  The primary reason patient is here to discuss new innovations for people with neuropathy and wanted another opinion before spending $7000. Patient is interested in checking his B6 and B12  as well. Patient would also like to know if his pain medications are appropriate. Patient is on Lyrica 200 mg TID. He also is on opioids. He sees pain management for this.  Prior medications that have been tried: gabapentin, Cymbalta, creams (did not help or did not tolerate)  He takes a b complex (Metanx) and Hemp oil  EtOH use: very rare  Restrictive diet? Gluten free Family history of neuropathy/myopathy/NM disease? No   MEDICATIONS:  Outpatient Encounter Medications as of 07/27/2022  Medication Sig Note   aspirin EC 81 MG tablet Take 1 tablet (81 mg total) by mouth daily.    carvedilol (COREG) 6.25 MG tablet TAKE 1 TABLET(6.25 MG) BY MOUTH TWICE DAILY WITH A MEAL    Cholecalciferol (VITAMIN D3) 5000 units CAPS Take 5,000-10,000 Units by mouth See admin instructions. Take 5,000 units by mouth once a day and rotate with 10,000 units every other day    clopidogrel (PLAVIX) 75 MG tablet TAKE 1 TABLET(75 MG) BY MOUTH DAILY    Coenzyme Q10 (CO Q-10 PO) Take 2 capsules by mouth daily.    FERROUS GLUCONATE-C-FOLIC ACID PO 1 capsule    isosorbide mononitrate (IMDUR) 60 MG 24 hr tablet TAKE 1 TABLET(60 MG) BY MOUTH DAILY (Patient taking differently: 30 mg. TAKE 1 TABLET(60 MG) BY MOUTH DAILY)    JARDIANCE 10 MG TABS tablet Take 10 mg by mouth daily.    L-Methylfolate-B6-B12 (METANX PO) Take 1 tablet by mouth daily.    lactobacillus acidophilus (BACID) TABS tablet Take 2 tablets by mouth  once.    losartan (COZAAR) 25 MG tablet TAKE 1 TABLET(25 MG) BY MOUTH DAILY    magnesium aspartate (MAGINEX) 615 MG tablet Take 120 mg by mouth 2 (two) times daily.    nitroGLYCERIN (NITROLINGUAL) 0.4 MG/SPRAY spray Place 1 spray under the tongue every 5 (five) minutes x 3 doses as needed for chest pain.    NUCYNTA ER 200 MG TB12 Take 150 mg by mouth 2 (two) times daily.    omega-3 acid ethyl esters (LOVAZA) 1 g capsule TAKE 2 CAPSULES BY MOUTH DAILY    OVER THE COUNTER MEDICATION Take 5 mLs by mouth See admin  instructions. Take 5 ml Tumeric by mouth as need for acid    oxyCODONE-acetaminophen (PERCOCET/ROXICET) 5-325 MG tablet Take 1 tablet by mouth daily as needed for moderate pain. 02/16/2021: Patient has on hand as needed.    potassium chloride (KLOR-CON) 10 MEQ tablet Take 1,020 mEq by mouth 2 (two) times daily.    pregabalin (LYRICA) 200 MG capsule Take 200 mg by mouth 3 (three) times daily.     Quercetin 250 MG TABS Take 500 mg by mouth QID.    ranolazine (RANEXA) 1000 MG SR tablet TAKE 1 TABLET BY MOUTH TWICE DAILY    rosuvastatin (CRESTOR) 40 MG tablet TAKE 1 TABLET(40 MG) BY MOUTH DAILY    saxagliptin HCl (ONGLYZA) 5 MG TABS tablet Take 5 mg by mouth daily.    spironolactone (ALDACTONE) 25 MG tablet TAKE 1/2 TABLET(12.5 MG) BY MOUTH DAILY    vitamin E 180 MG (400 UNITS) capsule Take 400 Units by mouth daily.    Ascorbic Acid (VITAMIN C PO) Take 1 tablet by mouth daily.    Black Pepper-Turmeric (TURMERIC CURCUMIN) 01-999 MG CAPS 1 capsule as needed for acid (Patient not taking: Reported on 07/27/2022)    OVER THE COUNTER MEDICATION Take 5-6 drops by mouth See admin instructions. CBD oil for pain relief    pioglitazone (ACTOS) 45 MG tablet Take 45 mg by mouth daily. (Patient not taking: Reported on 07/27/2022)    No facility-administered encounter medications on file as of 07/27/2022.    PAST MEDICAL HISTORY: Past Medical History:  Diagnosis Date   Atrial flutter ([place]) 02/02/2017   CAD, multiple vessel    Diabetes mellitus ([place])    Dyslipidemia    Hypertension     PAST SURGICAL HISTORY: Past Surgical History:  Procedure Laterality Date   A-FLUTTER ABLATION N/A 02/05/2017   Procedure: A-Flutter Ablation;  Surgeon: Thompson Grayer, MD;  Location: [place] [facility];  Service: Cardiovascular;  Laterality: N/A;   CARDIAC CATHETERIZATION  12/13/2011   No intervention - recommend medical therapy with increased medical trial   CARDIOVASCULAR STRESS TEST  10/26/2010   No scintigraphic evidence of  inducible myocardial ischemia. No ECG changes. EKG negative for ischemia.   CORONARY ARTERY BYPASS GRAFT  1997   LIMA-LAD, vein graft to diagonal, vein graft to obtuse marginal, and vein graft to RCA   LEFT HEART CATH AND CORS/GRAFTS ANGIOGRAPHY N/A 09/29/2018   Procedure: LEFT HEART CATH AND CORS/GRAFTS ANGIOGRAPHY;  Surgeon: Leonie Man, MD;  Location: [place] [facility];  Service: Cardiovascular;  Laterality: N/A;   LEFT HEART CATHETERIZATION WITH CORONARY/GRAFT ANGIOGRAM N/A 12/13/2011   Procedure: LEFT HEART CATHETERIZATION WITH Beatrix Fetters;  Surgeon: Troy Sine, MD;  Location: [facility] CATH LAB;  Service: Cardiovascular;  Laterality: N/A;   LOWER EXTREMITY ARTERIAL DOPPLER  01/30/2000   Normal arterial study   TEE WITHOUT CARDIOVERSION N/A 02/04/2017  Procedure: TRANSESOPHAGEAL ECHOCARDIOGRAM (TEE);  Surgeon: Dorothy Spark, MD;  [facility] ENDOSCOPY;  Service: Cardiovascular;  Laterality: N/A;   TRANSTHORACIC ECHOCARDIOGRAM  12/12/2011   EF >55%, LA severely dilated.   ULTRASOUND GUIDANCE FOR VASCULAR ACCESS  09/29/2018   Procedure: Ultrasound Guidance For Vascular Access;  Surgeon: Leonie Man, MD;  Location: [place] [facility];  Service: Cardiovascular;;    ALLERGIES: Allergies  Allergen Reactions   Nortriptyline Other (See Comments)    Groggy at low dose   Cymbalta [Duloxetine Hcl] Diarrhea    FAMILY HISTORY: Family History  Problem Relation Age of Onset   Heart disease Father    Diabetes Father    Hyperlipidemia Sister     SOCIAL HISTORY: Social History   Tobacco Use   Smoking status: Never   Smokeless tobacco: Never  Vaping Use   Vaping Use: Never used  Substance Use Topics   Alcohol use: Yes    Comment: occas   Drug use: Yes   Social History   Social History Narrative   Are you right handed or left handed? right   Are you currently employed ? retired   Caffeine 1 soda a day   Do you live at home alone?wife      What type of home do  you live in: 1 story or 2 story? two         OBJECTIVE: PHYSICAL EXAM: BP (!) 142/72   Pulse 67   Ht '5\' 8"'$  (1.727 m)   Wt 202 lb (91.6 kg)   SpO2 96%   BMI 30.71 kg/m   General: General appearance: Awake and alert. No distress. Cooperative with exam.  Skin: No obvious rash or jaundice. HEENT: Atraumatic. Anicteric. Lungs: Non-labored breathing on room air  Extremities: No edema. No obvious deformity.  Musculoskeletal: No obvious joint swelling. Psych: Affect appropriate.  Neurological: Mental Status: Alert. Speech fluent. No pseudobulbar affect Cranial Nerves: CNII: No RAPD. Visual fields grossly intact. CNIII, IV, VI: PERRL. No nystagmus. EOMI. CN V: Facial sensation intact bilaterally to fine touch. CN VII: Facial muscles symmetric and strong. No ptosis at rest. CN VIII: Hearing grossly intact bilaterally. CN IX: No hypophonia. CN X: Palate elevates symmetrically. CN XI: Full strength shoulder shrug bilaterally. CN XII: Tongue protrusion full and midline. No atrophy or fasciculations. No significant dysarthria Motor: Tone is normal No atrophy.  Individual muscle group testing (MRC grade out of 5):  Movement     Neck flexion 5    Neck extension 5     Right Left   Shoulder abduction 5 5   Shoulder adduction 5 5   Shoulder ext rotation 5 5   Shoulder int rotation 5 5   Elbow flexion 5 5   Elbow extension 5 5   Wrist extension 5 5   Wrist flexion 5 5   Finger abduction - FDI 5 5   Finger abduction - ADM 5 5   Finger extension 5 5   Finger distal flexion - 2/'3 5 5   '$ Finger distal flexion - 4/'5 5 5   '$ Thumb flexion - FPL 5 5   Thumb abduction - APB 5 5    Hip flexion 5 5   Hip extension 5 5   Hip adduction 5 5   Hip abduction 5 5   Knee extension 5 5   Knee flexion 5 5   Dorsiflexion 5 5   Plantarflexion 5 5     Reflexes:  Right Left  Bicep 2+ 2+   Tricep 2+ 2+   BrRad 2+ 2+   Knee 2+ 2+   Ankle 1+ 1+    Pathological Reflexes: Babinski:  flexor response bilaterally Sensation: Pinprick: Intact in upper extremities. Absent to bilateral lower calves in lower extremities Vibration: Intact in bilateral upper extremities. Absent at bilateral great toes, present at left ankle but not right ankle Proprioception: Absent at bilateral great toes Coordination: Intact finger-to- nose-finger bilaterally. Romberg with mild sway. Gait: Able to rise from chair with arms crossed unassisted. Normal, narrow-based gait. Imbalance with tandem walk.  Lab and Test Review: External labs: Normal or unremarkable: CMP, TSH, vit D B12: > 1500 (12/12/21) HbA1c 5.6  MRI cervical spine wo contrast (07/01/98): IMPRESSION  1.  SMALL FOCAL CENTRAL DISC PROTRUSION, C7-T1.  2.  HARD AND SOFT DISC C3-4 CENTRAL AND TO THE LEFT WITH LEFT C-4 NERVE ROOT ENCROACHMENT.  3.  CHANGES OF DIFFUSE MULTILEVEL SPONDYLOSIS FROM C-4 THROUGH C-7 AS DESCRIBED, WITH BORDERLINE  CANAL STENOSIS BUT NO SEVERE CORD FLATTENING - SEE REPORT.  EMG (10/13/1999): SUMMARY:     NCS:  Left peroneal, median and ulnar motor responses revealed  slowed conduction velocity and prolonged F-waves.  Left tibial motor  response showed reduced amplitude, slowed velocity and prolonged  F-waves.  Left sural and bilateral medial and lateral plantar  sensory responses were unobtainable.  Left median mixed responses  were normal.  Left median and ulnar digital sensory responses had  reduced amplitude.     EMG:  Needle exam of selected left lower extremity muscles  revealed complex motor units distally.   CONCLUSIONS:     This is an abnormal study.  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  Due to the  severity of the peripheral neuropathy, presence of a superimposed  medial and lateral plantar neuropathy as seen in tarsal tunnel  syndrome cannot be determined. There is also no evidence of other  entrapment neuropathies in left upper extremity.     ASSESSMENT: Ruben [name] is a 76 y.o. male who presents for evaluation of numbness and tingling. He has a relevant medical history of chronic pain (on Nucynta), HTN, DM, CAD s/p CABG and PCI, spondylosis s/p C3-C7 fusion (~2000). His neurological examination is pertinent for diminished sensation in bilateral lower extremities in a length dependent pattern. 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 Most recent HbA1c was 5.6 and B12 > 1500. 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 Overall, his symptoms seem well controlled on Lyrica 200 mg TID. I will send labs to make sure there are no other contributing factors.  PLAN: -Blood work: B6, B1, IFE -Continue to control blood sugars -Alpha lipoic acid 600 mg BID -Continue Lyrica 200 mg TID -Discussed long term effects of opioids and the potential to make pain worse  -Return to clinic in 1 year  The impression above as well as the plan as outlined below were extensively discussed with the patient who voiced understanding. All questions were answered to their satisfaction.  The patient was counseled on pertinent fall precautions per the printed material provided today, and as noted under the "Patient Instructions" section below.  When available, results of the above investigations and possible further recommendations will be communicated to the patient via telephone/MyChart. Patient to call office if not contacted after expected testing turnaround time.   Total time spent reviewing records, interview,  history/exam, documentation, and coordination of care on day of encounter:  50 min   Thank you for allowing me to participate in patient's care.  If I can answer any additional questions, I would be pleased to do so.  Kai Levins, MD   CC: Lujean Amel, MD [place]  [id number]  CC: Referring provider: Drexel Iha, NP [address],  [place] [address]

## 2022-07-27 ENCOUNTER — Other Ambulatory Visit (INDEPENDENT_AMBULATORY_CARE_PROVIDER_SITE_OTHER): Payer: Medicare Other

## 2022-07-27 ENCOUNTER — Ambulatory Visit (INDEPENDENT_AMBULATORY_CARE_PROVIDER_SITE_OTHER): Payer: Medicare Other | Admitting: Neurology

## 2022-07-27 ENCOUNTER — Encounter: Payer: Self-pay | Admitting: Neurology

## 2022-07-27 VITALS — BP 142/72 | HR 67 | Ht 68.0 in | Wt 202.0 lb

## 2022-07-27 DIAGNOSIS — E1142 Type 2 diabetes mellitus with diabetic polyneuropathy: Secondary | ICD-10-CM

## 2022-07-27 DIAGNOSIS — R2681 Unsteadiness on feet: Secondary | ICD-10-CM

## 2022-07-27 DIAGNOSIS — R209 Unspecified disturbances of skin sensation: Secondary | ICD-10-CM | POA: Diagnosis not present

## 2022-07-27 NOTE — Patient Instructions (Addendum)
I will do lab work today. I will be in touch when I have your results.  I would continue your current medications for neuropathy as your pain seems reasonable controlled. I am cautious about opioids making pain worse in the long term. We discussed this today.  You can try a supplement called alpha lipoic acid 600 mg twice daily for nerve health.  I would like to see you back in clinic in 1 year or sooner if needed.  The physicians and staff at [facility] are committed to providing excellent care. You may receive a survey requesting feedback about your experience at our office. We strive to receive "very good" responses to the survey questions. If you feel that your experience would prevent you from giving the office a "very good " response, please contact our office to try to remedy the situation. We may be reached at [phone]. Thank you for taking the time out of your busy day to complete the survey.  Kai Levins, MD [facility]  Preventing Falls at [facility] are common, often dreaded events in the lives of older people. Aside from the obvious injuries and even death that may result, fall can cause wide-ranging consequences including loss of independence, mental decline, decreased activity and mobility. Younger people are also at risk of falling, especially those with chronic illnesses and fatigue.  Ways to reduce risk for falling Examine diet and medications. Warm foods and alcohol dilate blood vessels, which can lead to dizziness when standing. Sleep aids, antidepressants and pain medications can also increase the likelihood of a fall.  Get a vision exam. Poor vision, cataracts and glaucoma increase the chances of falling.  Check foot gear. Shoes should fit snugly and have a sturdy, nonskid sole and a broad, low heel  Participate in a physician-approved exercise program to build and maintain muscle strength and improve balance and coordination. Programs that use ankle  weights or stretch bands are excellent for muscle-strengthening. Water aerobics programs and low-impact Tai Chi programs have also been shown to improve balance and coordination.  Increase vitamin D intake. Vitamin D improves muscle strength and increases the amount of calcium the body is able to absorb and deposit in bones.  How to prevent falls from common hazards Floors - Remove all loose wires, cords, and throw rugs. Minimize clutter. Make sure rugs are anchored and smooth. Keep furniture in its usual place.  Chairs -- Use chairs with straight backs, armrests and firm seats. Add firm cushions to existing pieces to add height.  Bathroom - Install grab bars and non-skid tape in the tub or shower. Use a bathtub transfer bench or a shower chair with a back support Use an elevated toilet seat and/or safety rails to assist standing from a low surface. Do not use towel racks or bathroom tissue holders to help you stand.  Lighting - Make sure halls, stairways, and entrances are well-lit. Install a night light in your bathroom or hallway. Make sure there is a light switch at the top and bottom of the staircase. Turn lights on if you get up in the middle of the night. Make sure lamps or light switches are within reach of the bed if you have to get up during the night.  Kitchen - Install non-skid rubber mats near the sink and stove. Clean spills immediately. Store frequently used utensils, pots, pans between waist and eye level. This helps prevent reaching and bending. Sit when getting things out of lower cupboards.  Living  room/ Bedrooms - Place furniture with wide spaces in between, giving enough room to move around. Establish a route through the living room that gives you something to hold onto as you walk.  Stairs - Make sure treads, rails, and rugs are secure. Install a rail on both sides of the stairs. If stairs are a threat, it might be helpful to arrange most of your activities on the lower level  to reduce the number of times you must climb the stairs.  Entrances and doorways - Install metal handles on the walls adjacent to the doorknobs of all doors to make it more secure as you travel through the doorway.  Tips for maintaining balance Keep at least one hand free at all times. Try using a backpack or fanny pack to hold things rather than carrying them in your hands. Never carry objects in both hands when walking as this interferes with keeping your balance.  Attempt to swing both arms from front to back while walking. This might require a conscious effort if Parkinson's disease has diminished your movement. It will, however, help you to maintain balance and posture, and reduce fatigue.  Consciously lift your feet off of the ground when walking. Shuffling and dragging of the feet is a common culprit in losing your balance.  When trying to navigate turns, use a "U" technique of facing forward and making a wide turn, rather than pivoting sharply.  Try to stand with your feet shoulder-length apart. When your feet are close together for any length of time, you increase your risk of losing your balance and falling.  Do one thing at a time. Don't try to walk and accomplish another task, such as reading or looking around. The decrease in your automatic reflexes complicates motor function, so the less distraction, the better.  Do not wear rubber or gripping soled shoes, they might "catch" on the floor and cause tripping.  Move slowly when changing positions. Use deliberate, concentrated movements and, if needed, use a grab bar or walking aid. Count 15 seconds between each movement. For example, when rising from a seated position, wait 15 seconds after standing to begin walking.  If balance is a continuous problem, you might want to consider a walking aid such as a cane, walking stick, or walker. Once you've mastered walking with help, you might be ready to try it on your own again.

## 2022-08-01 DIAGNOSIS — I739 Peripheral vascular disease, unspecified: Secondary | ICD-10-CM | POA: Diagnosis not present

## 2022-08-01 DIAGNOSIS — G629 Polyneuropathy, unspecified: Secondary | ICD-10-CM | POA: Diagnosis not present

## 2022-08-01 DIAGNOSIS — R2689 Other abnormalities of gait and mobility: Secondary | ICD-10-CM | POA: Diagnosis not present

## 2022-08-01 DIAGNOSIS — E1142 Type 2 diabetes mellitus with diabetic polyneuropathy: Secondary | ICD-10-CM | POA: Diagnosis not present

## 2022-08-02 LAB — IMMUNOFIXATION ELECTROPHORESIS
IgG (Immunoglobin G), Serum: 725 mg/dL (ref 600–1540)
IgM, Serum: 35 mg/dL — ABNORMAL LOW (ref 50–300)
Immunoglobulin A: 118 mg/dL (ref 70–320)

## 2022-08-02 LAB — VITAMIN B1: Vitamin B1 (Thiamine): 48 nmol/L — ABNORMAL HIGH (ref 8–30)

## 2022-08-02 LAB — VITAMIN B6: Vitamin B6: 53.4 ng/mL — ABNORMAL HIGH (ref 2.1–21.7)

## 2022-08-15 DIAGNOSIS — M9901 Segmental and somatic dysfunction of cervical region: Secondary | ICD-10-CM | POA: Diagnosis not present

## 2022-08-15 DIAGNOSIS — M5136 Other intervertebral disc degeneration, lumbar region: Secondary | ICD-10-CM | POA: Diagnosis not present

## 2022-08-15 DIAGNOSIS — M9903 Segmental and somatic dysfunction of lumbar region: Secondary | ICD-10-CM | POA: Diagnosis not present

## 2022-08-15 DIAGNOSIS — M9904 Segmental and somatic dysfunction of sacral region: Secondary | ICD-10-CM | POA: Diagnosis not present

## 2022-08-15 DIAGNOSIS — M9902 Segmental and somatic dysfunction of thoracic region: Secondary | ICD-10-CM | POA: Diagnosis not present

## 2022-08-30 DIAGNOSIS — E1142 Type 2 diabetes mellitus with diabetic polyneuropathy: Secondary | ICD-10-CM | POA: Diagnosis not present

## 2022-08-30 DIAGNOSIS — R2689 Other abnormalities of gait and mobility: Secondary | ICD-10-CM | POA: Diagnosis not present

## 2022-08-30 DIAGNOSIS — G629 Polyneuropathy, unspecified: Secondary | ICD-10-CM | POA: Diagnosis not present

## 2022-08-30 DIAGNOSIS — I739 Peripheral vascular disease, unspecified: Secondary | ICD-10-CM | POA: Diagnosis not present

## 2022-08-31 DIAGNOSIS — G609 Hereditary and idiopathic neuropathy, unspecified: Secondary | ICD-10-CM | POA: Diagnosis not present

## 2022-08-31 DIAGNOSIS — G894 Chronic pain syndrome: Secondary | ICD-10-CM | POA: Diagnosis not present

## 2022-09-05 ENCOUNTER — Other Ambulatory Visit: Payer: Self-pay | Admitting: Cardiovascular Disease

## 2022-09-13 ENCOUNTER — Other Ambulatory Visit: Payer: Self-pay | Admitting: Cardiovascular Disease

## 2022-09-13 DIAGNOSIS — H1032 Unspecified acute conjunctivitis, left eye: Secondary | ICD-10-CM | POA: Diagnosis not present

## 2022-09-14 ENCOUNTER — Other Ambulatory Visit: Payer: Self-pay | Admitting: Cardiovascular Disease

## 2022-09-21 ENCOUNTER — Other Ambulatory Visit: Payer: Self-pay | Admitting: Cardiovascular Disease

## 2022-09-22 ENCOUNTER — Other Ambulatory Visit: Payer: Self-pay | Admitting: Cardiovascular Disease

## 2022-09-26 DIAGNOSIS — M9903 Segmental and somatic dysfunction of lumbar region: Secondary | ICD-10-CM | POA: Diagnosis not present

## 2022-09-26 DIAGNOSIS — M9904 Segmental and somatic dysfunction of sacral region: Secondary | ICD-10-CM | POA: Diagnosis not present

## 2022-10-25 DIAGNOSIS — Z5181 Encounter for therapeutic drug level monitoring: Secondary | ICD-10-CM | POA: Diagnosis not present

## 2022-10-25 DIAGNOSIS — G609 Hereditary and idiopathic neuropathy, unspecified: Secondary | ICD-10-CM | POA: Diagnosis not present

## 2022-10-25 DIAGNOSIS — G894 Chronic pain syndrome: Secondary | ICD-10-CM | POA: Diagnosis not present

## 2022-10-25 DIAGNOSIS — Z79899 Other long term (current) drug therapy: Secondary | ICD-10-CM | POA: Diagnosis not present

## 2022-10-31 DIAGNOSIS — M9902 Segmental and somatic dysfunction of thoracic region: Secondary | ICD-10-CM | POA: Diagnosis not present

## 2022-10-31 DIAGNOSIS — M9901 Segmental and somatic dysfunction of cervical region: Secondary | ICD-10-CM | POA: Diagnosis not present

## 2022-10-31 DIAGNOSIS — M9904 Segmental and somatic dysfunction of sacral region: Secondary | ICD-10-CM | POA: Diagnosis not present

## 2022-10-31 DIAGNOSIS — M9903 Segmental and somatic dysfunction of lumbar region: Secondary | ICD-10-CM | POA: Diagnosis not present

## 2022-11-02 DIAGNOSIS — M9903 Segmental and somatic dysfunction of lumbar region: Secondary | ICD-10-CM | POA: Diagnosis not present

## 2022-11-02 DIAGNOSIS — M9904 Segmental and somatic dysfunction of sacral region: Secondary | ICD-10-CM | POA: Diagnosis not present

## 2022-11-02 DIAGNOSIS — M9902 Segmental and somatic dysfunction of thoracic region: Secondary | ICD-10-CM | POA: Diagnosis not present

## 2022-11-02 DIAGNOSIS — M9901 Segmental and somatic dysfunction of cervical region: Secondary | ICD-10-CM | POA: Diagnosis not present

## 2022-11-03 DIAGNOSIS — M9903 Segmental and somatic dysfunction of lumbar region: Secondary | ICD-10-CM | POA: Diagnosis not present

## 2022-11-03 DIAGNOSIS — M9901 Segmental and somatic dysfunction of cervical region: Secondary | ICD-10-CM | POA: Diagnosis not present

## 2022-11-03 DIAGNOSIS — M9904 Segmental and somatic dysfunction of sacral region: Secondary | ICD-10-CM | POA: Diagnosis not present

## 2022-11-03 DIAGNOSIS — M9902 Segmental and somatic dysfunction of thoracic region: Secondary | ICD-10-CM | POA: Diagnosis not present

## 2022-11-05 DIAGNOSIS — M9904 Segmental and somatic dysfunction of sacral region: Secondary | ICD-10-CM | POA: Diagnosis not present

## 2022-11-05 DIAGNOSIS — M9903 Segmental and somatic dysfunction of lumbar region: Secondary | ICD-10-CM | POA: Diagnosis not present

## 2022-11-05 DIAGNOSIS — M9902 Segmental and somatic dysfunction of thoracic region: Secondary | ICD-10-CM | POA: Diagnosis not present

## 2022-11-05 DIAGNOSIS — M9901 Segmental and somatic dysfunction of cervical region: Secondary | ICD-10-CM | POA: Diagnosis not present

## 2022-11-08 DIAGNOSIS — G542 Cervical root disorders, not elsewhere classified: Secondary | ICD-10-CM | POA: Diagnosis not present

## 2022-11-08 DIAGNOSIS — M79601 Pain in right arm: Secondary | ICD-10-CM | POA: Diagnosis not present

## 2022-11-15 ENCOUNTER — Ambulatory Visit: Payer: Medicare Other | Attending: Cardiovascular Disease | Admitting: Cardiovascular Disease

## 2022-11-15 ENCOUNTER — Encounter: Payer: Self-pay | Admitting: Cardiovascular Disease

## 2022-11-15 VITALS — BP 116/60 | HR 58 | Ht 70.0 in | Wt 205.2 lb

## 2022-11-15 DIAGNOSIS — I4892 Unspecified atrial flutter: Secondary | ICD-10-CM

## 2022-11-15 DIAGNOSIS — E782 Mixed hyperlipidemia: Secondary | ICD-10-CM

## 2022-11-15 DIAGNOSIS — E1140 Type 2 diabetes mellitus with diabetic neuropathy, unspecified: Secondary | ICD-10-CM | POA: Diagnosis not present

## 2022-11-15 DIAGNOSIS — G629 Polyneuropathy, unspecified: Secondary | ICD-10-CM | POA: Diagnosis not present

## 2022-11-15 DIAGNOSIS — I2510 Atherosclerotic heart disease of native coronary artery without angina pectoris: Secondary | ICD-10-CM | POA: Diagnosis not present

## 2022-11-15 DIAGNOSIS — Z951 Presence of aortocoronary bypass graft: Secondary | ICD-10-CM | POA: Diagnosis not present

## 2022-11-15 MED ORDER — CARVEDILOL 6.25 MG PO TABS: 6.2500 mg | ORAL_TABLET | Freq: Two times a day (BID) | ORAL | 3 refills | Status: DC

## 2022-11-15 MED ORDER — ROSUVASTATIN CALCIUM 40 MG PO TABS: 40.0000 mg | ORAL_TABLET | Freq: Every day | ORAL | 3 refills | Status: DC

## 2022-11-15 MED ORDER — SPIRONOLACTONE 25 MG PO TABS: ORAL_TABLET | ORAL | 3 refills | Status: DC

## 2022-11-15 MED ORDER — ISOSORBIDE MONONITRATE ER 60 MG PO TB24: ORAL_TABLET | ORAL | 3 refills | Status: DC

## 2022-11-15 MED ORDER — CLOPIDOGREL BISULFATE 75 MG PO TABS: 75.0000 mg | ORAL_TABLET | Freq: Every day | ORAL | 3 refills | Status: DC

## 2022-11-15 MED ORDER — RANOLAZINE ER 1000 MG PO TB12: 1000.0000 mg | ORAL_TABLET | Freq: Two times a day (BID) | ORAL | 3 refills | Status: DC

## 2022-11-15 MED ORDER — LOSARTAN POTASSIUM 25 MG PO TABS: 25.0000 mg | ORAL_TABLET | Freq: Every day | ORAL | 3 refills | Status: DC

## 2022-11-15 NOTE — Patient Instructions (Signed)
Medication Instructions:   No changes  *If you need a refill on your cardiac medications before your next appointment, please call your pharmacy*   Lab Work: Not needed    Testing/Procedures:   [address]- Sept 2024 Your physician has requested that you have an echocardiogram. Echocardiography is a painless test that uses sound waves to create images of your heart. It provides your doctor with information about the size and shape of your heart and how well your heart's chambers and valves are working. This procedure takes approximately one hour. There are no restrictions for this procedure. Please do NOT wear cologne, perfume, aftershave, or lotions (deodorant is allowed). Please arrive 15 minutes prior to your appointment time.    Follow-Up: At [facility], you and your health needs are our priority.  As part of our continuing mission to provide you with exceptional heart care, we have created designated Provider Care Teams.  These Care Teams include your primary Cardiologist (physician) and Advanced Practice Providers (APPs -  Physician Assistants and Nurse Practitioners) who all work together to provide you with the care you need, when you need it.     Your next appointment:   8 month(s)  The format for your next appointment:   In Person  Provider:   Shelva Majestic, MD    Other Instructions

## 2022-11-15 NOTE — Progress Notes (Signed)
Patient ID: Ruben [name], male   DOB: 10-25-1945, 77 y.o.   MRN: [id number]    PCP: Dr. Dagmar Hait  HPI: Ruben [name] is a 77 y.o. male who presents for a 50 month cardiology followup evaluation.  Mr. Cleavenger has established CAD and in 1997 underwent CABG surgery with a LIMA to the LAD, vein to the diagonal, vein to the obtuse marginal, and vein to the right coronary artery. In November 2004 he underwent stenting to the vein graft to the circumflex vessel. In December 2009 the graft to the circumflex was occluded and he underwent stenting of the left main into the superior ramus intermediate vessel. At that time he also underwent stenting of the vein graft to the right coronary artery. His last catheterization which was done for repeat chest pain in March 2013 showed patent stents. He has known occlusion of the graft that supplied the circumflex marginal vessel. The graft to diagonal vessel was patent and filled the proximal LAD system and the LIMA graft which filled the mid LAD system was widely patent.  He has ahistory of mixed hyperlipidemia and has been on aggressive lipid-lowering therapy over the past 18 years. He also has developed a peripheral neuropathy. He has mild diabetes mellitus and is now followed by [name]. In April 2014 which labs showed a total cholesterol was 125, triglycerides 52, HDL 55, LDL 52 on aggressive combination therapy consisting of Niaspan 2 g daily, Crestor 40 mg daily, Lovaza 2 g daily.  Initially, we had followed him with [facility] heart lab assessments.  On 12/08/2013 a followup nuclear perfusion study continued to show normal perfusion without scar or ischemia on his current medical therapy.  Ejection fraction was 64%.  He is retired for several years and with more time he exercises regularly and walks up to 3 -5 miles per day approximately 4-5 days per week and does yoga 3 days per week.  He denies any recurrent anginal symptoms.  He denies any shortness of breath.   He denies PND, orthopnea.  He denies palpitations.   Laboratory in May 2015 showed significant aggressive lipid management with an LDL particle number now at 371 and calculated LDL 43, HDL 57, triglycerides 37, total cholesterol 107.  Small LDL particles of 156.  He still had an increased VLDL size.  Insulin resistance score was less than 25.  At that time, I recommended he reduce his Niaspan from 2000 mg to 1000 mg a discussed the possibility of discontinuing this altogether in the future depending upon subsequent levels.  In March 2017 he underwent a repeat echo Doppler study which showed mild LVH with normal systolic function without wall motion abnormalities.  There was very mild aortic regurgitation.  His aortic root dimension measured 39 mm.  His left atrium was mildly dilated.  Pulmonary pressures were normal.  He also underwent a repeat lipoprofile which showed an LDL particle #856.  He had 136 small LDL particles was normal.  Total cholesterol was 127, HDL 55, triglycerides 57, and LDL 61.  Apo lipoprotein B was 54 and LPa was 66.  In May 2018  developed a new onset atrial flutter.  He was hospitalized; TEE showed mild dilation of his atrium without thrombus.  He underwent atrial flutter ablation by [name] and was found to have isthmus dependent right atrial flutter.  He was started on eliquis doing his presentation.  He saw [name] in follow-up on 03/07/2017 and eliquis was discontinued at that time and  he was told to resume aspirin.  He was questioning whether or not to take Plavix again.  He also had an ACE-induced cough and lisinopril was discontinued and he was started on low-dose losartan.    When I saw him in March 2019 , he was doing well and was without recurrent anginal symptoms or any recurrent atrial arrhythmias.  He continues have problems with his neuropathy  Because of the neuropathy he was walking approximately 2-3 miles per day instead of 3-5 miles as he had in the past.  He  continued to use Lyrica and Nucynta.  He was now back on Plavix and aspirin particular with his left main stent.  He is followed by [name] who checks his laboratory.  Most recent labs from 11/28/17 : TC 125, triglycerides 40, HDL 60, and LDL 50.  He has continued to be on Crestor, lovaza, and niacin with remote very low HDL levels and slightly icreased LP(a).  Prior to seeing him at his last office visit in June 2019 he  began to notice some left arm discomfort which was not clearly exertionally precipitated.  He describes it as feeling like he had just gotten a tetanus shot.  He denied any associated chest pressure.  However, when he walked up steps he noticed some mild increased shortness of breath.  He ultimately presented to urgent care where ECG was done.  With his CAD he was advised to go to the emergency room.  In the emergency room laboratory was drawn.  Troponins were negative.  He waited 9 hours but was never seen and ultimately left.  He called the office and was added onto my schedule.  In light of his recent chest pain with some atypical features I scheduled him for a nuclear perfusion study which was done on March 13, 2018.  Ejection fraction was 55%.  He did not develop any chest pain or ECG changes.  He had normal perfusion without evidence for scar or ischemia.  He underwent recent laboratory which has shown a cholesterol 125, triglycerides 53, HDL 55, LDL 59 on his current medical regimen.  LP(a) was 62.9.   I saw him in September 2019 that time he had issues with both of his ARB therapy and in its place I started him on olmesartan 20 mg daily.  Since I saw him, he was seen on several occasions in the office [name] and underwent EP cardiac catheterization on September 29, 2020 he had presented with fatigue Gres of chest pain commencing on September 26, 2018.  Currently he was only taking 1/2 tablet of Plavix 75 mg prior to presentation due to easy bruisability.  He underwent repeat  catheterization by [name] which revealed overall stable native coronary and graft disease out significant change from his 2013 cardiac catheterization.  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  The previously placed ostial left main to distal left main stent was widely patent old occlusion of his circumflex marginal and vein graft was marginal vessel.  Since hospital discharge he denies any recurrent episodes of chest pain.  However, at times he still experiences episodic mild shortness of breath.  Nuys PND orthopnea.  He is unaware of any recurrent atrial fib or flutter.  Past he had significant mixed hyperlipidemia and has continued to stay on simvastatin 40 mg, 2 capsules twice a day niacin.  In the past we discussed discontinuance of niacin and he preferred to stay on this and has done well.  When I saw him in office follow-up in February 2020 he was mildly hypertensive and with his echo documentation of grade 2 diastolic dysfunction I recommended the addition of low-dose spironolactone at 12.5 mg daily.  He has tolerated this well.   I evaluated him in a telemedicine visit on Feb 12, 2019.  At that time his blood pressure was stable typically was running in the 110 -123456 range systolically. He saw Dr. Elyse Hsu on December 04, 2018.  Repeat laboratory showed a total cholesterol 123, triglycerides 70, HDL 52, LDL 60.  He denies any recurrent episodes of chest tightness.  He exercises daily and basically exercises for at least 180 minutes/week typically on the treadmill.  He usually walks at  2.8 mph and 4% grade.  He denies any shortness of breath with the exception of one occasion when he was walking up a steep hill but this ultimately subsided.  He continues to be on Onglyza and p.o. glitazone for his diabetes mellitus followed closely by Dr. all-time her.  When I reviewed Dr. Alzheimer's laboratory his creatinine was 1.50, K 5.3.  He continued to be on Lyrica and Nucynta  for his peripheral neuropathy.     I saw him in March 2021.  Since his May 2020 evaluation he has continued to do well.  He denies any recurrent anginal symptomatology.  He sees Dr. Elyse Hsu regularly.  Most recent laboratory December 01, 2019 showed a total cholesterol 112, HDL 49, LDL 50 and triglycerides 73.  He continues to be on rosuvastatin 40 mg, Lovaza 2 capsules daily in addition to niacin 1000 mg.  His blood pressure has been fairly well controlled on his medical regimen consisting of carvedilol, isosorbide and spironolactone.  He continues to be on ranolazine.  He is diabetic on  Onglyza and Actos.   I last saw him on Feb 16, 2021. Since his prior evaluation he  continued to do well.  He has been keeping busy doing projects at home.  He typically walks 2 times per week for 50 minutes.  Recently, when he does these extended projects, he seems to have to rest in 3 to 4 hours.  Niacin was stopped 4 months ago.  He has not had any classic anginal symptoms.  He required removal of cerumen by Dr. Radene Journey due to significant wax buildup but this did not affect his hearing.  He had undergone recent laboratory in March 2022 which showed a total cholesterol 114, HDL 40, LDL 58, triglycerides 73.  Creatinine was 0.87.  Potassium 4.3.  He had normal LFTs.  Since it was 25 years since his CABG revascularization surgery, with some increased fatigue I recommended he undergo a Lexiscan Myoview study.  On April 20, 2021, he underwent a [place] Myoview study.  This was low risk with EF 63% with normal perfusion without evidence for scar or ischemia.  Since I last saw him, he has continued to remain stable and denies any chest pain, significant change in shortness of breath or palpitations..  He has not been exercising as much as he had in the past.  He now sees Dr. Grandville Silos at [facility] for endocrinology since Dr. Adele Barthel retired.  He had seen Dr. Kai Levins of [facility] neurology for his neuropathy.  He continues to be on carvedilol 6.25 mg  twice a day, isosorbide 60 mg daily, spironolactone 12.5 mg, ranolazine 1000 mg twice a day and losartan 25 mg daily for hypertension and his CAD.  He is on rosuvastatin  40 mg and Lovaza for mixed hyperlipidemia.  He has been on chronic DAPT with aspirin/Plavix.  He is diabetic on Jardiance, Actos therapy.  He presents for follow-up evaluation.   Past Surgical History:  Procedure Laterality Date   A-FLUTTER ABLATION N/A 02/05/2017   Procedure: A-Flutter Ablation;  Surgeon: Thompson Grayer, MD;  Location: [place] [facility];  Service: Cardiovascular;  Laterality: N/A;   CARDIAC CATHETERIZATION  12/13/2011   No intervention - recommend medical therapy with increased medical trial   CARDIOVASCULAR STRESS TEST  10/26/2010   No scintigraphic evidence of inducible myocardial ischemia. No ECG changes. EKG negative for ischemia.   CORONARY ARTERY BYPASS GRAFT  1997   LIMA-LAD, vein graft to diagonal, vein graft to obtuse marginal, and vein graft to RCA   LEFT HEART CATH AND CORS/GRAFTS ANGIOGRAPHY N/A 09/29/2018   Procedure: LEFT HEART CATH AND CORS/GRAFTS ANGIOGRAPHY;  Surgeon: Leonie Man, MD;  Location: [place] [facility];  Service: Cardiovascular;  Laterality: N/A;   LEFT HEART CATHETERIZATION WITH CORONARY/GRAFT ANGIOGRAM N/A 12/13/2011   Procedure: LEFT HEART CATHETERIZATION WITH Beatrix Fetters;  Surgeon: Troy Sine, MD;  Location: [facility] CATH LAB;  Service: Cardiovascular;  Laterality: N/A;   LOWER EXTREMITY ARTERIAL DOPPLER  01/30/2000   Normal arterial study   TEE WITHOUT CARDIOVERSION N/A 02/04/2017   Procedure: TRANSESOPHAGEAL ECHOCARDIOGRAM (TEE);  Surgeon: Dorothy Spark, MD;  Location: [facility];  Service: Cardiovascular;  Laterality: N/A;   TRANSTHORACIC ECHOCARDIOGRAM  12/12/2011   EF >55%, LA severely dilated.   ULTRASOUND GUIDANCE FOR VASCULAR ACCESS  09/29/2018   Procedure: Ultrasound Guidance For Vascular Access;  Surgeon: Leonie Man, MD;  Location: [place] [facility];  Service: Cardiovascular;;    Allergies  Allergen Reactions   Nortriptyline Other (See Comments)    Groggy at low dose   Cymbalta [Duloxetine Hcl] Diarrhea    Current Outpatient Medications  Medication Sig Dispense Refill   aspirin EC 81 MG tablet Take 1 tablet (81 mg total) by mouth daily. 90 tablet 3   Cholecalciferol (VITAMIN D3) 5000 units CAPS Take 5,000-10,000 Units by mouth See admin instructions. Take 5,000 units by mouth once a day and rotate with 10,000 units every other day     Coenzyme Q10 (CO Q-10 PO) Take 2 capsules by mouth daily.     FERROUS GLUCONATE-C-FOLIC ACID PO Take 1 capsule by mouth daily in the afternoon.     JARDIANCE 10 MG TABS tablet Take 10 mg by mouth daily.     magnesium aspartate (MAGINEX) 615 MG tablet Take 120 mg by mouth 2 (two) times daily.     NUCYNTA ER 200 MG TB12 Take 150 mg by mouth 2 (two) times daily.  0   omega-3 acid ethyl esters (LOVAZA) 1 g capsule TAKE 2 CAPSULES BY MOUTH DAILY 180 capsule 0   pioglitazone (ACTOS) 45 MG tablet Take 45 mg by mouth daily.     potassium chloride (KLOR-CON) 10 MEQ tablet Take 1,020 mEq by mouth 2 (two) times daily.     pregabalin (LYRICA) 200 MG capsule Take 200 mg by mouth 3 (three) times daily.      Quercetin 250 MG TABS Take 500 mg by mouth QID.     vitamin E 180 MG (400 UNITS) capsule Take 400 Units by mouth daily.     Black Pepper-Turmeric (TURMERIC CURCUMIN) 01-999 MG CAPS 1 capsule as needed for acid (Patient not taking: Reported on 11/15/2022)     carvedilol (COREG) 6.25 MG  tablet Take 1 tablet (6.25 mg total) by mouth 2 (two) times daily with a meal. 180 tablet 3   clopidogrel (PLAVIX) 75 MG tablet Take 1 tablet (75 mg total) by mouth daily. 90 tablet 3   isosorbide mononitrate (IMDUR) 60 MG 24 hr tablet TAKE 1 TABLET(60 MG) BY MOUTH DAILY 90 tablet 3   L-Methylfolate-B6-B12 (METANX PO) Take 1 tablet by mouth daily.     lactobacillus acidophilus (BACID) TABS tablet Take 2 tablets by mouth once.      losartan (COZAAR) 25 MG tablet Take 1 tablet (25 mg total) by mouth daily. 90 tablet 3   nitroGLYCERIN (NITROLINGUAL) 0.4 MG/SPRAY spray Place 1 spray under the tongue every 5 (five) minutes x 3 doses as needed for chest pain. (Patient not taking: Reported on 11/15/2022) 12 g 11   OVER THE COUNTER MEDICATION Take 5 mLs by mouth See admin instructions. Take 5 ml Tumeric by mouth as need for acid     oxyCODONE-acetaminophen (PERCOCET/ROXICET) 5-325 MG tablet Take 1 tablet by mouth daily as needed for moderate pain.     ranolazine (RANEXA) 1000 MG SR tablet Take 1 tablet (1,000 mg total) by mouth 2 (two) times daily. 180 tablet 3   rosuvastatin (CRESTOR) 40 MG tablet Take 1 tablet (40 mg total) by mouth daily. 90 tablet 3   saxagliptin HCl (ONGLYZA) 5 MG TABS tablet Take 5 mg by mouth daily.     spironolactone (ALDACTONE) 25 MG tablet TAKE 1/2 TABLET(12.5 MG) BY MOUTH DAILY 45 tablet 3   No current facility-administered medications for this visit.    Social History   Socioeconomic History   Marital status: Married    Spouse name: Not on file   Number of children: Not on file   Years of education: Not on file   Highest education level: Not on file  Occupational History   Not on file  Tobacco Use   Smoking status: Never   Smokeless tobacco: Never  Vaping Use   Vaping Use: Never used  Substance and Sexual Activity   Alcohol use: Yes    Comment: occas   Drug use: Yes   Sexual activity: Not on file  Other Topics Concern   Not on file  Social History Narrative   Are you right handed or left handed? right   Are you currently employed ? retired   Caffeine 1 soda a day   Do you live at home alone?wife      What type of home do you live in: 1 story or 2 story? two       Social Determinants of Health   Financial Resource Strain: Not on file  Food Insecurity: Not on file  Transportation Needs: Not on file  Physical Activity: Not on file  Stress: Not on file  Social Connections:  Not on file  Intimate Partner Violence: Not on file    Family History  Problem Relation Age of Onset   Heart disease Father    Diabetes Father    Hyperlipidemia Sister    Socially he is married. His former wife  was Dory Larsen, an iins[employer] agent. He has been married for several years with his present wife and has 2 stepchildren with her. He does exercise least 4-5 days per week. He has now retired. Previously he was an Optometrist for [employer].   ROS General: Negative; No fevers, chills, or night sweats;  HEENT: Recent removal of significant buildup of cerumen in his ears; No changes in  vision or hearing, sinus congestion, difficulty swallowing Pulmonary: Negative; No cough, wheezing, shortness of breath, hemoptysis Cardiovascular: See HPI GI: Negative; No nausea, vomiting, diarrhea, or abdominal pain GU: Negative; No dysuria, hematuria, or difficulty voiding Musculoskeletal: Negative; no myalgias, joint pain, or weakness Hematologic/Oncology: Negative; no easy bruising, bleeding Endocrine: Positive for diabetes mellitus, type II; no heat/cold intolerance;  Neuro: Positive for peripheral neuropathy; no changes in balance, headaches Skin: Negative; No rashes or skin lesions Psychiatric: Negative; No behavioral problems, depression Sleep: Negative; No snoring, daytime sleepiness, hypersomnolence, bruxism, restless legs, hypnogognic hallucinations, no cataplexy Other comprehensive 14 point system review is negative.  PE BP 116/60 (BP Location: Left Arm, Patient Position: Sitting, Cuff Size: Normal)   Pulse (!) 58   Ht 5' 10"$  (1.778 m)   Wt 205 lb 3.2 oz (93.1 kg)   SpO2 96%   BMI 29.44 kg/m    Repeat blood pressure by me 130/64  Wt Readings from Last 3 Encounters:  11/15/22 205 lb 3.2 oz (93.1 kg)  07/27/22 202 lb (91.6 kg)  04/20/21 208 lb (94.3 kg)   General: Alert, oriented, no distress.  Skin: normal turgor, no rashes, warm and dry HEENT: Normocephalic,  atraumatic. Pupils equal round and reactive to light; sclera anicteric; extraocular muscles intact; Nose without nasal septal hypertrophy Mouth/Parynx benign; Mallinpatti scale 3 Neck: No JVD, no carotid bruits; normal carotid upstroke Lungs: clear to ausculatation and percussion; no wheezing or rales Chest wall: without tenderness to palpitation Heart: PMI not displaced, RRR, s1 s2 normal, 1/6 systolic murmur, no diastolic murmur, no rubs, gallops, thrills, or heaves Abdomen: soft, nontender; no hepatosplenomehaly, BS+; abdominal aorta nontender and not dilated by palpation. Back: no CVA tenderness Pulses 2+ Musculoskeletal: full range of motion, normal strength, no joint deformities Extremities: no clubbing cyanosis or edema, Homan's sign negative  Neurologic: grossly nonfocal; Cranial nerves grossly wnl Psychologic: Normal mood and affect   November 15, 2022 ECG (independently read by me): Sinus bradycardia at 58, 1st degree AV block, PR 280 msecc, PVC, nonpsecific T wave anbormality  Feb 16, 2021 ECG (independently read by me): Sinus bradycardia at 50, first-degree AV block.  March 24, 2021ECG (independently read by me): Sinus bradycardia 53 bpm, first-degree AV block with.  Seconds.  Left axis deviation.  No significant ST changes.  QTc interval 429 ms.PR 280 msec  February 2020 ECG (independently read by me): Sinus rhythm at 62 bpm with first-degree AV block..  PR 248 msecms.  No significant ST changes.  September 2019 ECG (independently read by me): Sinus bradycardia at 50; 1st degree AV block; PR 276 msec  June 2019 ECG (independently read by me): From March 04, 2018 at the emergency room/urgent care: Normal sinus rhythm at 60 bpm with first-degree AV block.  PR interval 258 ms.  Left axis deviation.  Poor R wave progression V1 through V3.  No acute abnormalities.  March 2019 ECG (independently read by me): sinus bradycardia 54 bpm.  First degree AV block.  Nonspecific T waves.  PR  interval 268 ms  September 2018 ECG (independently read by me): Sinus bradycardia at 55 bpm with first-degree AV block.  PR interval 264 ms.  Nonspecific T changes inferiorly.  November 2017 ECG (independently read by me): Sinus bradycardia at 59 bpm.  First degree AV block with a PR interval at 23 ms.  Incomplete right bundle branch block.  No significant ST segment changes.  September 2015 ECG (independently read by me): Sinus bradycardia 55 beats per minute.  First degree AV block with PR interval 258 ms.  Mild RV conduction delay.  Nonspecific T changes.  Prior 07/13/2013 ECG: Sinus bradycardia 54 beats per minute with first-degree AV block. Nonspecific T changes.  LABS:    Latest Ref Rng & Units 11/20/2018    8:45 AM 09/28/2018   12:46 AM 09/27/2018   11:43 AM  BMP  Glucose 65 - 99 mg/dL 125  77  106   BUN 8 - 27 mg/dL 11  9  8   $ Creatinine 0.76 - 1.27 mg/dL 0.92  0.95  0.99   BUN/Creat Ratio 10 - 24 12     Sodium 134 - 144 mmol/L 135  137  137   Potassium 3.5 - 5.2 mmol/L 5.0  3.8  3.9   Chloride 96 - 106 mmol/L 100  105  105   CO2 20 - 29 mmol/L 29  26  26   $ Calcium 8.6 - 10.2 mg/dL 9.5  8.8  9.1       Latest Ref Rng & Units 06/11/2018    8:19 AM 03/04/2018    4:14 PM 06/27/2017    8:10 AM  Hepatic Function  Total Protein 6.0 - 8.5 g/dL 6.2  6.7  6.5   Albumin 3.5 - 4.8 g/dL 4.4  4.3  4.5   AST 0 - 40 IU/L 24  40  29   ALT 0 - 44 IU/L 20  25  24   $ Alk Phosphatase 39 - 117 IU/L 91  93  100   Total Bilirubin 0.0 - 1.2 mg/dL 0.9  1.3  1.2       Latest Ref Rng & Units 10/15/2018   12:28 PM 09/30/2018    4:55 AM 09/29/2018    5:40 AM  CBC  WBC 3.4 - 10.8 x10E3/uL 4.2  4.7  4.5   Hemoglobin 13.0 - 17.7 g/dL 13.5  12.2  12.5   Hematocrit 37.5 - 51.0 % 40.4  35.7  37.7   Platelets 150 - 450 x10E3/uL 184  117  123    Lab Results  Component Value Date   MCV 95 10/15/2018   MCV 94.2 09/30/2018   MCV 93.8 09/29/2018   Lab Results  Component Value Date   TSH 1.870 06/11/2018    Lab Results  Component Value Date   HGBA1C 5.5 06/27/2017    Lipid Panel     Component Value Date/Time   CHOL 110 09/28/2018 0046   CHOL 125 06/11/2018 0819   CHOL 127 11/09/2015 0854   CHOL 107 02/12/2014 0801   TRIG 37 09/28/2018 0046   TRIG 57 11/09/2015 0854   TRIG 37 02/12/2014 0801   HDL 51 09/28/2018 0046   HDL 55 06/11/2018 0819   HDL 55 11/09/2015 0854   HDL 57 02/12/2014 0801   CHOLHDL 2.2 09/28/2018 0046   VLDL 7 09/28/2018 0046   LDLCALC 52 09/28/2018 0046   LDLCALC 59 06/11/2018 0819   LDLCALC 61 11/09/2015 0854   LDLCALC 43 02/12/2014 0801    RADIOLOGY: No results found.  IMPRESSION:  1. Coronary artery disease involving native coronary artery of native heart without angina pectoris   2. Hx of CABG   3. Mixed hyperlipidemia   4. Type 2 diabetes mellitus with diabetic neuropathy, without long-term current use of insulin ([place])   5. Peripheral polyneuropathy   6. Atrial flutter with rapid ventricular response [facility]): s/p ablation     ASSESSMENT AND PLAN: Mr. Domynic Tindal is a 77 year old white male  who underwent CABG revascularization surgery in 1997 at age 71; and is status post initial stenting of the graft to the circumflex vessel in 2004. In December 2009 he underwent stenting of his left main into the superior ramus branch and stenting to the vein graft supplying the RCA. The graft to the circumflex was occluded  At catheterization in March 2013 the stent to the left main/ramus vessel was widely patent as was the stent to the vein graft to the right coronary artery. His proximal LAD was being supplied by his diagonal vessel and the mid LAD was supplied by the LIMA graft.  His nuclear perfusion study  in March 2015 showed normal perfusion without scar or ischemia on his medical therapy.  He developed atrial flutter in 2018 which was isthmus dependent right atrial flutter and was successfully ablated.  He was on eliquis anticoagulation for one month.  When I   saw him, he had been on aspirin alone and with his left main stent I also recommended resumption of Plavix.  He has been stable without significant previous anginal symptoms on a medical regimen consisting of carvedilol 6.25 mg twice a day, isosorbide 30 mg, ranolazine 1000 mg twice a day in addition to his losartan.  Cardiac catheterization findings in January 2020 were not significantly changed from 2013 evaluation. The left main stent is widely patent.  Presently, he has been without anginal symptomatology.  When last seen in May 2022 at that time I recommended he undergo a [place] study in light of some shortness of breath.  This remained stable without scar or ischemia with EF at 63%.  He continues to be on long-term DAPT.  His blood pressure today is stable on carvedilol 6.25 mg twice a day, losartan 25 mg daily, and spironolactone 25 mg.  He is not having any angina on isosorbide 60 mg in addition to Ranexa 1000 mg twice a day.  He continues to be on rosuvastatin 40 mg for lipid-lowering therapy.  He is now followed by Dr. Grandville Silos at [facility] for his diabetes mellitus and is now on Jardiance in addition to Actos.  He is on Lyrica for his neuropathy and had recently saw Dr. Kai Levins.  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  His blood pressure today is stable.  I have recommended he undergo a follow-up echo Doppler study in 6 months and I will see him in 6 to 8 months for follow-up evaluation.   Troy Sine, MD, [facility]  11/17/2022 4:39 PM

## 2022-11-17 ENCOUNTER — Encounter: Payer: Self-pay | Admitting: Cardiovascular Disease

## 2022-11-28 DIAGNOSIS — M9903 Segmental and somatic dysfunction of lumbar region: Secondary | ICD-10-CM | POA: Diagnosis not present

## 2022-11-28 DIAGNOSIS — M9902 Segmental and somatic dysfunction of thoracic region: Secondary | ICD-10-CM | POA: Diagnosis not present

## 2022-11-28 DIAGNOSIS — M9904 Segmental and somatic dysfunction of sacral region: Secondary | ICD-10-CM | POA: Diagnosis not present

## 2022-11-28 DIAGNOSIS — M9901 Segmental and somatic dysfunction of cervical region: Secondary | ICD-10-CM | POA: Diagnosis not present

## 2022-12-03 ENCOUNTER — Other Ambulatory Visit: Payer: Self-pay | Admitting: Cardiovascular Disease

## 2022-12-03 DIAGNOSIS — M9902 Segmental and somatic dysfunction of thoracic region: Secondary | ICD-10-CM | POA: Diagnosis not present

## 2022-12-03 DIAGNOSIS — Z23 Encounter for immunization: Secondary | ICD-10-CM | POA: Diagnosis not present

## 2022-12-03 DIAGNOSIS — M9901 Segmental and somatic dysfunction of cervical region: Secondary | ICD-10-CM | POA: Diagnosis not present

## 2022-12-03 DIAGNOSIS — M9904 Segmental and somatic dysfunction of sacral region: Secondary | ICD-10-CM | POA: Diagnosis not present

## 2022-12-03 DIAGNOSIS — M9903 Segmental and somatic dysfunction of lumbar region: Secondary | ICD-10-CM | POA: Diagnosis not present

## 2022-12-05 DIAGNOSIS — M9904 Segmental and somatic dysfunction of sacral region: Secondary | ICD-10-CM | POA: Diagnosis not present

## 2022-12-05 DIAGNOSIS — M9902 Segmental and somatic dysfunction of thoracic region: Secondary | ICD-10-CM | POA: Diagnosis not present

## 2022-12-05 DIAGNOSIS — M9901 Segmental and somatic dysfunction of cervical region: Secondary | ICD-10-CM | POA: Diagnosis not present

## 2022-12-05 DIAGNOSIS — M9903 Segmental and somatic dysfunction of lumbar region: Secondary | ICD-10-CM | POA: Diagnosis not present

## 2022-12-07 DIAGNOSIS — M9904 Segmental and somatic dysfunction of sacral region: Secondary | ICD-10-CM | POA: Diagnosis not present

## 2022-12-07 DIAGNOSIS — M9901 Segmental and somatic dysfunction of cervical region: Secondary | ICD-10-CM | POA: Diagnosis not present

## 2022-12-07 DIAGNOSIS — M9902 Segmental and somatic dysfunction of thoracic region: Secondary | ICD-10-CM | POA: Diagnosis not present

## 2022-12-07 DIAGNOSIS — M9903 Segmental and somatic dysfunction of lumbar region: Secondary | ICD-10-CM | POA: Diagnosis not present

## 2022-12-10 DIAGNOSIS — M9901 Segmental and somatic dysfunction of cervical region: Secondary | ICD-10-CM | POA: Diagnosis not present

## 2022-12-10 DIAGNOSIS — M9904 Segmental and somatic dysfunction of sacral region: Secondary | ICD-10-CM | POA: Diagnosis not present

## 2022-12-10 DIAGNOSIS — M9903 Segmental and somatic dysfunction of lumbar region: Secondary | ICD-10-CM | POA: Diagnosis not present

## 2022-12-10 DIAGNOSIS — M9902 Segmental and somatic dysfunction of thoracic region: Secondary | ICD-10-CM | POA: Diagnosis not present

## 2022-12-17 DIAGNOSIS — M9901 Segmental and somatic dysfunction of cervical region: Secondary | ICD-10-CM | POA: Diagnosis not present

## 2022-12-17 DIAGNOSIS — M9902 Segmental and somatic dysfunction of thoracic region: Secondary | ICD-10-CM | POA: Diagnosis not present

## 2022-12-17 DIAGNOSIS — M9903 Segmental and somatic dysfunction of lumbar region: Secondary | ICD-10-CM | POA: Diagnosis not present

## 2022-12-17 DIAGNOSIS — M9904 Segmental and somatic dysfunction of sacral region: Secondary | ICD-10-CM | POA: Diagnosis not present

## 2022-12-18 DIAGNOSIS — E1142 Type 2 diabetes mellitus with diabetic polyneuropathy: Secondary | ICD-10-CM | POA: Diagnosis not present

## 2022-12-18 DIAGNOSIS — E673 Hypervitaminosis D: Secondary | ICD-10-CM | POA: Diagnosis not present

## 2022-12-18 DIAGNOSIS — E782 Mixed hyperlipidemia: Secondary | ICD-10-CM | POA: Diagnosis not present

## 2022-12-18 DIAGNOSIS — B356 Tinea cruris: Secondary | ICD-10-CM | POA: Diagnosis not present

## 2022-12-18 DIAGNOSIS — Z951 Presence of aortocoronary bypass graft: Secondary | ICD-10-CM | POA: Diagnosis not present

## 2022-12-18 DIAGNOSIS — E1169 Type 2 diabetes mellitus with other specified complication: Secondary | ICD-10-CM | POA: Diagnosis not present

## 2022-12-18 DIAGNOSIS — I2510 Atherosclerotic heart disease of native coronary artery without angina pectoris: Secondary | ICD-10-CM | POA: Diagnosis not present

## 2022-12-18 DIAGNOSIS — R7989 Other specified abnormal findings of blood chemistry: Secondary | ICD-10-CM | POA: Diagnosis not present

## 2022-12-18 DIAGNOSIS — Z7984 Long term (current) use of oral hypoglycemic drugs: Secondary | ICD-10-CM | POA: Diagnosis not present

## 2022-12-18 DIAGNOSIS — E1159 Type 2 diabetes mellitus with other circulatory complications: Secondary | ICD-10-CM | POA: Diagnosis not present

## 2022-12-20 DIAGNOSIS — E1142 Type 2 diabetes mellitus with diabetic polyneuropathy: Secondary | ICD-10-CM | POA: Diagnosis not present

## 2022-12-20 DIAGNOSIS — E1159 Type 2 diabetes mellitus with other circulatory complications: Secondary | ICD-10-CM | POA: Diagnosis not present

## 2022-12-20 DIAGNOSIS — E1169 Type 2 diabetes mellitus with other specified complication: Secondary | ICD-10-CM | POA: Diagnosis not present

## 2022-12-20 DIAGNOSIS — G609 Hereditary and idiopathic neuropathy, unspecified: Secondary | ICD-10-CM | POA: Diagnosis not present

## 2022-12-20 DIAGNOSIS — I2510 Atherosclerotic heart disease of native coronary artery without angina pectoris: Secondary | ICD-10-CM | POA: Diagnosis not present

## 2022-12-20 DIAGNOSIS — E673 Hypervitaminosis D: Secondary | ICD-10-CM | POA: Diagnosis not present

## 2022-12-20 DIAGNOSIS — G894 Chronic pain syndrome: Secondary | ICD-10-CM | POA: Diagnosis not present

## 2022-12-20 DIAGNOSIS — E782 Mixed hyperlipidemia: Secondary | ICD-10-CM | POA: Diagnosis not present

## 2022-12-20 DIAGNOSIS — R7989 Other specified abnormal findings of blood chemistry: Secondary | ICD-10-CM | POA: Diagnosis not present

## 2023-01-04 DIAGNOSIS — M9903 Segmental and somatic dysfunction of lumbar region: Secondary | ICD-10-CM | POA: Diagnosis not present

## 2023-01-04 DIAGNOSIS — M9901 Segmental and somatic dysfunction of cervical region: Secondary | ICD-10-CM | POA: Diagnosis not present

## 2023-01-04 DIAGNOSIS — M9904 Segmental and somatic dysfunction of sacral region: Secondary | ICD-10-CM | POA: Diagnosis not present

## 2023-01-04 DIAGNOSIS — M9902 Segmental and somatic dysfunction of thoracic region: Secondary | ICD-10-CM | POA: Diagnosis not present

## 2023-01-14 ENCOUNTER — Encounter: Payer: Self-pay | Admitting: Cardiovascular Disease

## 2023-01-14 MED ORDER — CARVEDILOL 6.25 MG PO TABS: 6.2500 mg | ORAL_TABLET | Freq: Two times a day (BID) | ORAL | 3 refills | Status: DC

## 2023-01-30 MED ORDER — OMEGA-3-ACID ETHYL ESTERS 1 G PO CAPS: 2.0000 | ORAL_CAPSULE | Freq: Every day | ORAL | 3 refills | Status: DC

## 2023-01-30 NOTE — Addendum Note (Signed)
Addended by: Lindell Spar on: 01/30/2023 11:15 AM   Modules accepted: Orders

## 2023-02-14 DIAGNOSIS — G894 Chronic pain syndrome: Secondary | ICD-10-CM | POA: Diagnosis not present

## 2023-02-14 DIAGNOSIS — G609 Hereditary and idiopathic neuropathy, unspecified: Secondary | ICD-10-CM | POA: Diagnosis not present

## 2023-04-10 DIAGNOSIS — M9901 Segmental and somatic dysfunction of cervical region: Secondary | ICD-10-CM | POA: Diagnosis not present

## 2023-04-10 DIAGNOSIS — M9904 Segmental and somatic dysfunction of sacral region: Secondary | ICD-10-CM | POA: Diagnosis not present

## 2023-04-10 DIAGNOSIS — M9902 Segmental and somatic dysfunction of thoracic region: Secondary | ICD-10-CM | POA: Diagnosis not present

## 2023-04-10 DIAGNOSIS — M9903 Segmental and somatic dysfunction of lumbar region: Secondary | ICD-10-CM | POA: Diagnosis not present

## 2023-04-11 DIAGNOSIS — G894 Chronic pain syndrome: Secondary | ICD-10-CM | POA: Diagnosis not present

## 2023-04-11 DIAGNOSIS — G609 Hereditary and idiopathic neuropathy, unspecified: Secondary | ICD-10-CM | POA: Diagnosis not present

## 2023-05-16 DIAGNOSIS — I10 Essential (primary) hypertension: Secondary | ICD-10-CM | POA: Diagnosis not present

## 2023-05-16 DIAGNOSIS — E7800 Pure hypercholesterolemia, unspecified: Secondary | ICD-10-CM | POA: Diagnosis not present

## 2023-05-16 DIAGNOSIS — Z79899 Other long term (current) drug therapy: Secondary | ICD-10-CM | POA: Diagnosis not present

## 2023-05-16 DIAGNOSIS — E1142 Type 2 diabetes mellitus with diabetic polyneuropathy: Secondary | ICD-10-CM | POA: Diagnosis not present

## 2023-05-16 DIAGNOSIS — G629 Polyneuropathy, unspecified: Secondary | ICD-10-CM | POA: Diagnosis not present

## 2023-05-16 DIAGNOSIS — Z0001 Encounter for general adult medical examination with abnormal findings: Secondary | ICD-10-CM | POA: Diagnosis not present

## 2023-05-16 DIAGNOSIS — I2510 Atherosclerotic heart disease of native coronary artery without angina pectoris: Secondary | ICD-10-CM | POA: Diagnosis not present

## 2023-05-17 DIAGNOSIS — Z0001 Encounter for general adult medical examination with abnormal findings: Secondary | ICD-10-CM | POA: Diagnosis not present

## 2023-05-17 DIAGNOSIS — Z125 Encounter for screening for malignant neoplasm of prostate: Secondary | ICD-10-CM | POA: Diagnosis not present

## 2023-05-17 DIAGNOSIS — E7800 Pure hypercholesterolemia, unspecified: Secondary | ICD-10-CM | POA: Diagnosis not present

## 2023-05-17 DIAGNOSIS — Z79899 Other long term (current) drug therapy: Secondary | ICD-10-CM | POA: Diagnosis not present

## 2023-05-22 DIAGNOSIS — H11002 Unspecified pterygium of left eye: Secondary | ICD-10-CM | POA: Diagnosis not present

## 2023-05-22 DIAGNOSIS — H2513 Age-related nuclear cataract, bilateral: Secondary | ICD-10-CM | POA: Diagnosis not present

## 2023-05-22 DIAGNOSIS — H524 Presbyopia: Secondary | ICD-10-CM | POA: Diagnosis not present

## 2023-05-22 DIAGNOSIS — E119 Type 2 diabetes mellitus without complications: Secondary | ICD-10-CM | POA: Diagnosis not present

## 2023-05-22 DIAGNOSIS — H25013 Cortical age-related cataract, bilateral: Secondary | ICD-10-CM | POA: Diagnosis not present

## 2023-05-22 DIAGNOSIS — H52203 Unspecified astigmatism, bilateral: Secondary | ICD-10-CM | POA: Diagnosis not present

## 2023-05-23 ENCOUNTER — Ambulatory Visit (HOSPITAL_COMMUNITY): Payer: Medicare Other | Attending: Cardiovascular Disease

## 2023-05-23 DIAGNOSIS — Z951 Presence of aortocoronary bypass graft: Secondary | ICD-10-CM | POA: Diagnosis not present

## 2023-05-23 DIAGNOSIS — I2510 Atherosclerotic heart disease of native coronary artery without angina pectoris: Secondary | ICD-10-CM

## 2023-05-23 LAB — ECHOCARDIOGRAM COMPLETE
AR max vel: 2.83 cm2
AV Area VTI: 2.89 cm2
AV Area mean vel: 2.67 cm2
AV Mean grad: 7.5 mmHg
AV Peak grad: 13.8 mmHg
Ao pk vel: 1.86 m/s
Area-P 1/2: 4.4 cm2
P 1/2 time: 468 msec
S' Lateral: 2.8 cm

## 2023-05-24 DIAGNOSIS — Z23 Encounter for immunization: Secondary | ICD-10-CM | POA: Diagnosis not present

## 2023-06-02 ENCOUNTER — Other Ambulatory Visit: Payer: Self-pay | Admitting: Cardiovascular Disease

## 2023-06-06 DIAGNOSIS — Z79899 Other long term (current) drug therapy: Secondary | ICD-10-CM | POA: Diagnosis not present

## 2023-06-06 DIAGNOSIS — Z5181 Encounter for therapeutic drug level monitoring: Secondary | ICD-10-CM | POA: Diagnosis not present

## 2023-06-06 DIAGNOSIS — Z79891 Long term (current) use of opiate analgesic: Secondary | ICD-10-CM | POA: Diagnosis not present

## 2023-06-06 DIAGNOSIS — G609 Hereditary and idiopathic neuropathy, unspecified: Secondary | ICD-10-CM | POA: Diagnosis not present

## 2023-06-06 DIAGNOSIS — G894 Chronic pain syndrome: Secondary | ICD-10-CM | POA: Diagnosis not present

## 2023-07-11 DIAGNOSIS — Z23 Encounter for immunization: Secondary | ICD-10-CM | POA: Diagnosis not present

## 2023-07-18 ENCOUNTER — Encounter: Payer: Self-pay | Admitting: Cardiovascular Disease

## 2023-07-18 ENCOUNTER — Ambulatory Visit: Payer: Medicare Other | Attending: Cardiovascular Disease | Admitting: Cardiovascular Disease

## 2023-07-18 VITALS — BP 120/58 | HR 57 | Ht 68.0 in | Wt 209.0 lb

## 2023-07-18 DIAGNOSIS — I2510 Atherosclerotic heart disease of native coronary artery without angina pectoris: Secondary | ICD-10-CM

## 2023-07-18 DIAGNOSIS — G629 Polyneuropathy, unspecified: Secondary | ICD-10-CM

## 2023-07-18 DIAGNOSIS — E782 Mixed hyperlipidemia: Secondary | ICD-10-CM

## 2023-07-18 DIAGNOSIS — E1140 Type 2 diabetes mellitus with diabetic neuropathy, unspecified: Secondary | ICD-10-CM

## 2023-07-18 DIAGNOSIS — Z951 Presence of aortocoronary bypass graft: Secondary | ICD-10-CM | POA: Diagnosis present

## 2023-07-18 DIAGNOSIS — I358 Other nonrheumatic aortic valve disorders: Secondary | ICD-10-CM | POA: Diagnosis not present

## 2023-07-18 DIAGNOSIS — I10 Essential (primary) hypertension: Secondary | ICD-10-CM

## 2023-07-18 MED ORDER — LOSARTAN POTASSIUM 25 MG PO TABS: 25.0000 mg | ORAL_TABLET | Freq: Every day | ORAL | 3 refills | Status: DC

## 2023-07-18 MED ORDER — CLOPIDOGREL BISULFATE 75 MG PO TABS: 75.0000 mg | ORAL_TABLET | Freq: Every day | ORAL | 3 refills | Status: DC

## 2023-07-18 MED ORDER — SPIRONOLACTONE 25 MG PO TABS: ORAL_TABLET | ORAL | 3 refills | Status: DC

## 2023-07-18 MED ORDER — ICOSAPENT ETHYL 1 G PO CAPS: 2.0000 g | ORAL_CAPSULE | Freq: Two times a day (BID) | ORAL | 3 refills | Status: DC

## 2023-07-18 MED ORDER — ISOSORBIDE MONONITRATE ER 60 MG PO TB24: ORAL_TABLET | ORAL | 3 refills | Status: DC

## 2023-07-18 MED ORDER — RANOLAZINE ER 1000 MG PO TB12: 1000.0000 mg | ORAL_TABLET | Freq: Two times a day (BID) | ORAL | 1 refills | Status: DC

## 2023-07-18 MED ORDER — NITROGLYCERIN 0.4 MG/SPRAY TL SOLN: 1.0000 | 11 refills | Status: DC | PRN

## 2023-07-18 MED ORDER — ROSUVASTATIN CALCIUM 40 MG PO TABS: 40.0000 mg | ORAL_TABLET | Freq: Every day | ORAL | 3 refills | Status: DC

## 2023-07-18 NOTE — Progress Notes (Signed)
Patient ID: Ruben [name], male   DOB: 1946/09/02, 77 y.o.   MRN: [id number]       PCP: Dr. Felipa Eth  HPI: Ruben [name] is a 77 y.o. male who presents for an 8 month cardiology followup evaluation.  Ruben [name] has established CAD and in 1997 underwent CABG surgery with a LIMA to the LAD, vein to the diagonal, vein to the obtuse marginal, and vein to the right coronary artery. In November 2004 he underwent stenting to the vein graft to the circumflex vessel. In December 2009 the graft to the circumflex was occluded and he underwent stenting of the left main into the superior ramus intermediate vessel. At that time he also underwent stenting of the vein graft to the right coronary artery. His last catheterization which was done for repeat chest pain in March 2013 showed patent stents. He has known occlusion of the graft that supplied the circumflex marginal vessel. The graft to diagonal vessel was patent and filled the proximal LAD system and the LIMA graft which filled the mid LAD system was widely patent.  He has ahistory of mixed hyperlipidemia and has been on aggressive lipid-lowering therapy over the past 18 years. He also has developed a peripheral neuropathy. He has mild diabetes mellitus and is now followed by Dr. Kyra Searles. In April 2014 which labs showed a total cholesterol was 125, triglycerides 52, HDL 55, LDL 52 on aggressive combination therapy consisting of Niaspan 2 g daily, Crestor 40 mg daily, Lovaza 2 g daily.  Initially, we had followed him with [facility] heart lab assessments.  On 12/08/2013 a followup nuclear perfusion study continued to show normal perfusion without scar or ischemia on his current medical therapy.  Ejection fraction was 64%.  He is [name] for several years and with more time he exercises regularly and walks up to 3 -5 miles per day approximately 4-5 days per week and does yoga 3 days per week.  He denies any recurrent anginal symptoms.  He denies any shortness of  breath.  He denies PND, orthopnea.  He denies palpitations.   Laboratory in May 2015 showed significant aggressive lipid management with an LDL particle number now at 371 and calculated LDL 43, HDL 57, triglycerides 37, total cholesterol 107.  Small LDL particles of 156.  He still had an increased VLDL size.  Insulin resistance score was less than 25.  At that time, I recommended he reduce his Niaspan from 2000 mg to 1000 mg a discussed the possibility of discontinuing this altogether in the future depending upon subsequent levels.  In March 2017 he underwent a repeat echo Doppler study which showed mild LVH with normal systolic function without wall motion abnormalities.  There was very mild aortic regurgitation.  His aortic root dimension measured 39 mm.  His left atrium was mildly dilated.  Pulmonary pressures were normal.  He also underwent a repeat lipoprofile which showed an LDL particle #856.  He had 136 small LDL particles was normal.  Total cholesterol was 127, HDL 55, triglycerides 57, and LDL 61.  Apo lipoprotein B was 54 and LPa was 66.  In May 2018  developed a new onset atrial flutter.  He was hospitalized; TEE showed mild dilation of his atrium without thrombus.  He underwent atrial flutter ablation by Dr. Johney Frame and was found to have isthmus dependent right atrial flutter.  He was started on eliquis doing his presentation.  He saw Dr. Johney Frame in follow-up on 03/07/2017 and eliquis was discontinued at  that time and he was told to resume aspirin.  He was questioning whether or not to take Plavix again.  He also had an ACE-induced cough and lisinopril was discontinued and he was started on low-dose losartan.    When I saw him in March 2019 , he was doing well and was without recurrent anginal symptoms or any recurrent atrial arrhythmias.  He continues have problems with his neuropathy  Because of the neuropathy he was walking approximately 2-3 miles per day instead of 3-5 miles as he had in the  past.  He continued to use Lyrica and Nucynta.  He was now back on Plavix and aspirin particular with his left main stent.  He is followed by Ruben [name] who checks his laboratory.  Most recent labs from 11/28/17 : TC 125, triglycerides 40, HDL 60, and LDL 50.  He has continued to be on Crestor, lovaza, and niacin with remote very low HDL levels and slightly icreased LP(a).  Prior to seeing him at his last office visit in June 2019 he  began to notice some left arm discomfort which was not clearly exertionally precipitated.  He describes it as feeling like he had just gotten a tetanus shot.  He denied any associated chest pressure.  However, when he walked up steps he noticed some mild increased shortness of breath.  He ultimately presented to urgent care where ECG was done.  With his CAD he was advised to go to the emergency room.  In the emergency room laboratory was drawn.  Troponins were negative.  He waited 9 hours but was never seen and ultimately left.  He called the office and was added onto my schedule.  In light of his recent chest pain with some atypical features I scheduled him for a nuclear perfusion study which was done on March 13, 2018.  Ejection fraction was 55%.  He did not develop any chest pain or ECG changes.  He had normal perfusion without evidence for scar or ischemia.  He underwent recent laboratory which has shown a cholesterol 125, triglycerides 53, HDL 55, LDL 59 on his current medical regimen.  LP(a) was 62.9.   I saw him in September 2019 that time he had issues with both of his ARB therapy and in its place I started him on olmesartan 20 mg daily.  Since I saw him, he was seen on several occasions in the office Ruben [name] and underwent EP cardiac catheterization on September 29, 2020 he had presented with fatigue Gres of chest pain commencing on September 26, 2018.  Currently he was only taking 1/2 tablet of Plavix 75 mg prior to presentation due to easy bruisability.  He underwent  repeat catheterization by Dr. Herbie Baltimore which revealed overall stable native coronary and graft disease out significant change from his 2013 cardiac catheterization.  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  The previously placed ostial left main to distal left main stent was widely patent old occlusion of his circumflex marginal and vein graft was marginal vessel.  Since hospital discharge he denies any recurrent episodes of chest pain.  However, at times he still experiences episodic mild shortness of breath.  Nuys PND orthopnea.  He is unaware of any recurrent atrial fib or flutter.  Past he had significant mixed hyperlipidemia and has continued to stay on simvastatin 40 mg, 2 capsules twice a day niacin.  In the past we discussed discontinuance of niacin and he preferred to stay on this and has  done well.    When I saw him in office follow-up in February 2020 he was mildly hypertensive and with his echo documentation of grade 2 diastolic dysfunction I recommended the addition of low-dose spironolactone at 12.5 mg daily.  He has tolerated this well.   I evaluated him in a telemedicine visit on Feb 12, 2019.  At that time his blood pressure was stable typically was running in the 110 -120 range systolically. He saw Dr. Leslie Dales on December 04, 2018.  Repeat laboratory showed a total cholesterol 123, triglycerides 70, HDL 52, LDL 60.  He denies any recurrent episodes of chest tightness.  He exercises daily and basically exercises for at least 180 minutes/week typically on the treadmill.  He usually walks at  2.8 mph and 4% grade.  He denies any shortness of breath with the exception of one occasion when he was walking up a steep hill but this ultimately subsided.  He continues to be on Onglyza and p.o. glitazone for his diabetes mellitus followed closely by Dr. all-time her.  When I reviewed Dr. Alzheimer's laboratory his creatinine was 1.50, K 5.3.  He continued to be on Lyrica and Nucynta  for his peripheral  neuropathy.    I saw him in March 2021.  Since his May 2020 evaluation he has continued to do well.  He denies any recurrent anginal symptomatology.  He sees Dr. Leslie Dales regularly.  Most recent laboratory December 01, 2019 showed a total cholesterol 112, HDL 49, LDL 50 and triglycerides 73.  He continues to be on rosuvastatin 40 mg, Lovaza 2 capsules daily in addition to niacin 1000 mg.  His blood pressure has been fairly well controlled on his medical regimen consisting of carvedilol, isosorbide and spironolactone.  He continues to be on ranolazine.  He is diabetic on  Onglyza and Actos.   I saw him on Feb 16, 2021. Since his prior evaluation he  continued to do well.  He has been keeping busy doing projects at home.  He typically walks 2 times per week for 50 minutes.  Recently, when he does these extended projects, he seems to have to rest in 3 to 4 hours.  Niacin was stopped 4 months ago.  He has not had any classic anginal symptoms.  He required removal of cerumen by Dr. Narda Bonds due to significant wax buildup but this did not affect his hearing.  He had undergone recent laboratory in March 2022 which showed a total cholesterol 114, HDL 40, LDL 58, triglycerides 73.  Creatinine was 0.87.  Potassium 4.3.  He had normal LFTs.  Since it was 25 years since his CABG revascularization surgery, with some increased fatigue I recommended he undergo a Lexiscan Myoview study.  On April 20, 2021, he underwent a [place] Myoview study.  This was low risk with EF 63% with normal perfusion without evidence for scar or ischemia.  I last saw him on November 15, 2022 at which time he remained stable and denied chest pain or significant change in shortness of breath or palpitations.    He was not exercising as much as he had in the past.  He now sees Dr. Janee [name] at [name] for endocrinology since Dr. Leslie Dales [name].  He had seen Ruben [name] of [facility] neurology for his neuropathy.  He continues to be on  carvedilol 6.25 mg twice a day, isosorbide 60 mg daily, spironolactone 12.5 mg, ranolazine 1000 mg twice a day and losartan 25 mg daily for hypertension  and his CAD.  He is on rosuvastatin 40 mg and Lovaza for mixed hyperlipidemia.  He has been on chronic DAPT with aspirin/Plavix.  He is diabetic on Jardiance, Actos therapy.  That evaluation his blood pressure was stable.  I recommended he undergo a follow-up echo Doppler study months for further evaluation.  Recently, he feels well.  He underwent a 2D echo Doppler study on May 23, 2023 which showed normal systolic function with EF 60 to 65% without wall motion abnormality.  He had normal global strain.  Left atrium was mildly dilated.  There was mild aortic sclerosis.  Ascending aorta was minimally dilated at 39 mm.  This I saw him he has been seeing Ruben [name].  He continues to be on DAPT with aspirin/Plavix without side effects.  He is on carvedilol 6.25 mg twice a day, isosorbide mononitrate 60 mg daily, losartan 25 mg and ranolazine 1000 mg twice a day.  He is without angina.  He is on rosuvastatin 40 mg and Lovaza 2 capsules daily. He is diabetic on Jardiance in addition to pioglitazone.  He presents for evaluation.   Past Surgical History:  Procedure Laterality Date   A-FLUTTER ABLATION N/A 02/05/2017   Procedure: A-Flutter Ablation;  Surgeon: Hillis Range, MD;  Location: [facility] INVASIVE CV LAB;  Service: Cardiovascular;  Laterality: N/A;   CARDIAC CATHETERIZATION  12/13/2011   No intervention - recommend medical therapy with increased medical trial   CARDIOVASCULAR STRESS TEST  10/26/2010   No scintigraphic evidence of inducible myocardial ischemia. No ECG changes. EKG negative for ischemia.   CORONARY ARTERY BYPASS GRAFT  1997   LIMA-LAD, vein graft to diagonal, vein graft to obtuse marginal, and vein graft to RCA   LEFT HEART CATH AND CORS/GRAFTS ANGIOGRAPHY N/A 09/29/2018   Procedure: LEFT HEART CATH AND CORS/GRAFTS ANGIOGRAPHY;   Surgeon: Marykay Lex, MD;  Location: [facility] INVASIVE CV LAB;  Service: Cardiovascular;  Laterality: N/A;   LEFT HEART CATHETERIZATION WITH CORONARY/GRAFT ANGIOGRAM N/A 12/13/2011   Procedure: LEFT HEART CATHETERIZATION WITH Isabel Caprice;  Surgeon: Lennette Bihari, MD;  Location: [facility] CATH LAB;  Service: Cardiovascular;  Laterality: N/A;   LOWER EXTREMITY ARTERIAL DOPPLER  01/30/2000   Normal arterial study   TEE WITHOUT CARDIOVERSION N/A 02/04/2017   Procedure: TRANSESOPHAGEAL ECHOCARDIOGRAM (TEE);  Surgeon: Lars Masson, MD;  Location: [facility] ENDOSCOPY;  Service: Cardiovascular;  Laterality: N/A;   TRANSTHORACIC ECHOCARDIOGRAM  12/12/2011   EF >55%, LA severely dilated.   ULTRASOUND GUIDANCE FOR VASCULAR ACCESS  09/29/2018   Procedure: Ultrasound Guidance For Vascular Access;  Surgeon: Marykay Lex, MD;  Location: [facility] INVASIVE CV LAB;  Service: Cardiovascular;;    Allergies  Allergen Reactions   Nortriptyline Other (See Comments)    Groggy at low dose   Cymbalta [Duloxetine Hcl] Diarrhea    Current Outpatient Medications  Medication Sig Dispense Refill   aspirin EC 81 MG tablet Take 1 tablet (81 mg total) by mouth daily. 90 tablet 3   Black Pepper-Turmeric (TURMERIC CURCUMIN) 01-999 MG CAPS      carvedilol (COREG) 6.25 MG tablet Take 1 tablet (6.25 mg total) by mouth 2 (two) times daily with a meal. 180 tablet 3   Cholecalciferol (VITAMIN D3) 5000 units CAPS Take 5,000-10,000 Units by mouth See admin instructions. Take 5,000 units by mouth once a day and rotate with 10,000 units every other day     Coenzyme Q10 (CO Q-10 PO) Take 2 capsules by mouth daily.  FERROUS GLUCONATE-C-FOLIC ACID PO Take 1 capsule by mouth daily in the afternoon.     JARDIANCE 10 MG TABS tablet Take 10 mg by mouth daily.     magnesium aspartate (MAGINEX) 615 MG tablet Take 120 mg by mouth 2 (two) times daily.     NUCYNTA ER 200 MG TB12 Take 150 mg by mouth 2 (two) times daily.  0   pioglitazone  (ACTOS) 45 MG tablet Take 45 mg by mouth daily.     pregabalin (LYRICA) 200 MG capsule Take 200 mg by mouth 3 (three) times daily.      Quercetin 250 MG TABS Take 500 mg by mouth QID.     VITAMIN A PO Take by mouth.     vitamin E 180 MG (400 UNITS) capsule Take 400 Units by mouth daily.     clopidogrel (PLAVIX) 75 MG tablet Take 1 tablet (75 mg total) by mouth daily. 90 tablet 3   icosapent Ethyl (VASCEPA) 1 g capsule Take 2 capsules (2 g total) by mouth 2 (two) times daily. 360 capsule 3   isosorbide mononitrate (IMDUR) 60 MG 24 hr tablet TAKE 1 TABLET(60 MG) BY MOUTH DAILY 90 tablet 3   losartan (COZAAR) 25 MG tablet Take 1 tablet (25 mg total) by mouth daily. 90 tablet 3   nitroGLYCERIN (NITROLINGUAL) 0.4 MG/SPRAY spray Place 1 spray under the tongue every 5 (five) minutes x 3 doses as needed for chest pain. 12 g 11   potassium chloride (KLOR-CON) 10 MEQ tablet Take 1,020 mEq by mouth 2 (two) times daily.     ranolazine (RANEXA) 1000 MG SR tablet Take 1 tablet (1,000 mg total) by mouth 2 (two) times daily. 180 tablet 1   rosuvastatin (CRESTOR) 40 MG tablet Take 1 tablet (40 mg total) by mouth daily. 90 tablet 3   spironolactone (ALDACTONE) 25 MG tablet TAKE 1/2 TABLET(12.5 MG) BY MOUTH DAILY 45 tablet 3   No current facility-administered medications for this visit.    Social History   Socioeconomic History   Marital status: Married    Spouse name: Not on file   Number of children: Not on file   Years of education: Not on file   Highest education level: Not on file  Occupational History   Not on file  Tobacco Use   Smoking status: Never   Smokeless tobacco: Never  Vaping Use   Vaping status: Never Used  Substance and Sexual Activity   Alcohol use: Yes    Comment: occas   Drug use: Yes   Sexual activity: Not on file  Other Topics Concern   Not on file  Social History Narrative   Are you right handed or left handed? right   Are you currently employed ? [name]   Caffeine 1  soda a day   Do you live at home alone?wife      What type of home do you live in: 1 story or 2 story? two       Social Determinants of [name]   Financial Resource Strain: Low Risk  (02/11/2023)   Received from [facility]   Overall Financial Resource Strain (CARDIA)    Difficulty of Paying Living Expenses: Not very hard  Food Insecurity: No Food Insecurity (02/11/2023)   Received from [facility]   Hunger Vital Sign    Worried About Running Out of Food in the Last Year: Never true    Ran Out of Food in the Last Year: Never true  Transportation Needs:  No Transportation Needs (02/11/2023)   Received from [facility] - Transportation    Lack of Transportation (Medical): No    Lack of Transportation (Non-Medical): No  Physical Activity: Insufficiently Active (02/11/2023)   Received from [facility]   Exercise Vital Sign    Days of Exercise per Week: 2 days    Minutes of Exercise per Session: 30 min  Stress: No Stress Concern Present (02/11/2023)   Received from [name] - Occupational Stress Questionnaire    Feeling of Stress : Not at all  Social Connections: Somewhat Isolated (02/11/2023)   Received from [facility]   Social Network    How would you rate your social network (family, work, friends)?: Restricted participation with some degree of social isolation  Intimate Partner Violence: Not At Risk (02/11/2023)   Received from [name]   HITS    Over the last 12 months how often did your partner physically hurt you?: 1    Over the last 12 months how often did your partner insult you or talk down to you?: 1    Over the last 12 months how often did your partner threaten you with physical harm?: 1    Over the last 12 months how often did your partner scream or curse at you?: 1    Family History  Problem Relation Age of Onset   Heart disease Father    Diabetes Father    Hyperlipidemia Sister    Socially he  is married. His former wife  was Lorna Dibble, an iinsurance agent. He has been married for 22 years with his present wife and has 2 stepchildren with her. He does exercise least 4-5 days per week. He has now [name]. Previously he was an Airline pilot for [employer].   ROS General: Negative; No fevers, chills, or night sweats;  HEENT: Recent removal of significant buildup of cerumen in his ears; No changes in vision or hearing, sinus congestion, difficulty swallowing Pulmonary: Negative; No cough, wheezing, shortness of breath, hemoptysis Cardiovascular: See HPI GI: Negative; No nausea, vomiting, diarrhea, or abdominal pain GU: Negative; No dysuria, hematuria, or difficulty voiding Musculoskeletal: Negative; no myalgias, joint pain, or weakness Hematologic/Oncology: Negative; no easy bruising, bleeding Endocrine: Positive for diabetes mellitus, type II; no heat/cold intolerance;  Neuro: Positive for peripheral neuropathy; no changes in balance, headaches Skin: Negative; No rashes or skin lesions Psychiatric: Negative; No behavioral problems, depression Sleep: Negative; No snoring, daytime sleepiness, hypersomnolence, bruxism, restless legs, hypnogognic hallucinations, no cataplexy Other comprehensive 14 point system review is negative.  PE BP (!) 120/58   Pulse (!) 57   Ht 5\' 8"  (1.727 m)   Wt 209 lb (94.8 kg)   SpO2 96%   BMI 31.78 kg/m    Repeat blood pressure by me 134/60  Wt Readings from Last 3 Encounters:  07/18/23 209 lb (94.8 kg)  11/15/22 205 lb 3.2 oz (93.1 kg)  07/27/22 202 lb (91.6 kg)    General: Alert, oriented, no distress.  Skin: normal turgor, no rashes, warm and dry HEENT: Normocephalic, atraumatic. Pupils equal round and reactive to light; sclera anicteric; extraocular muscles intact;  Nose without nasal septal hypertrophy Mouth/Parynx benign; Mallinpatti scale 3 Neck: No JVD, no carotid bruits; normal carotid upstroke Lungs: clear to ausculatation  and percussion; no wheezing or rales Chest wall: without tenderness to palpitation Heart: PMI not displaced, RRR, s1 s2 normal, 1/6 systolic murmur, no diastolic murmur, no rubs, gallops,  thrills, or heaves Abdomen: soft, nontender; no hepatosplenomehaly, BS+; abdominal aorta nontender and not dilated by palpation. Back: no CVA tenderness Pulses 2+ Musculoskeletal: full range of motion, normal strength, no joint deformities Extremities: no clubbing cyanosis or edema, Homan's sign negative  Neurologic: grossly nonfocal; Cranial nerves grossly wnl Psychologic: Normal mood and affect    EKG Interpretation Date/Time:  Thursday July 18 2023 13:51:28 EDT Ventricular Rate:  57 PR Interval:  294 QRS Duration:  114 QT Interval:  430 QTC Calculation: 418 R Axis:   -55  Text Interpretation: Sinus bradycardia with 1st degree A-V block Left axis deviation Incomplete right bundle branch block Nonspecific T wave abnormality When compared with ECG of 27-Sep-2018 11:35, Criteria for Septal infarct are no longer Present Confirmed by Nicki Guadalajara ([id number]) on 07/18/2023 2:07:04 PM    November 15, 2022 ECG (independently read by me): Sinus bradycardia at 58, 1st degree AV block, PR 280 msecc, PVC, nonpsecific T wave anbormality  Feb 16, 2021 ECG (independently read by me): Sinus bradycardia at 50, first-degree AV block.  March 24, 2021ECG (independently read by me): Sinus bradycardia 53 bpm, first-degree AV block with.  Seconds.  Left axis deviation.  No significant ST changes.  QTc interval 429 ms.PR 280 msec  February 2020 ECG (independently read by me): Sinus rhythm at 62 bpm with first-degree AV block..  PR 248 msecms.  No significant ST changes.  September 2019 ECG (independently read by me): Sinus bradycardia at 50; 1st degree AV block; PR 276 msec  June 2019 ECG (independently read by me): From March 04, 2018 at the emergency room/urgent care: Normal sinus rhythm at 60 bpm with first-degree AV  block.  PR interval 258 ms.  Left axis deviation.  Poor R wave progression V1 through V3.  No acute abnormalities.  March 2019 ECG (independently read by me): sinus bradycardia 54 bpm.  First degree AV block.  Nonspecific T waves.  PR interval 268 ms  September 2018 ECG (independently read by me): Sinus bradycardia at 55 bpm with first-degree AV block.  PR interval 264 ms.  Nonspecific T changes inferiorly.  November 2017 ECG (independently read by me): Sinus bradycardia at 59 bpm.  First degree AV block with a PR interval at 23 ms.  Incomplete right bundle branch block.  No significant ST segment changes.  September 2015 ECG (independently read by me): Sinus bradycardia 55 beats per minute.  First degree AV block with PR interval 258 ms.  Mild RV conduction delay.  Nonspecific T changes.  Prior 07/13/2013 ECG: Sinus bradycardia 54 beats per minute with first-degree AV block. Nonspecific T changes.  LABS:    Latest Ref Rng & Units 11/20/2018    8:45 AM 09/28/2018   12:46 AM 09/27/2018   11:43 AM  BMP  Glucose 65 - 99 mg/dL 784  77  696   BUN 8 - 27 mg/dL 11  9  8    Creatinine 0.76 - 1.27 mg/dL 2.95  2.84  1.32   BUN/Creat Ratio 10 - 24 12     Sodium 134 - 144 mmol/L 135  137  137   Potassium 3.5 - 5.2 mmol/L 5.0  3.8  3.9   Chloride 96 - 106 mmol/L 100  105  105   CO2 20 - 29 mmol/L 29  26  26    Calcium 8.6 - 10.2 mg/dL 9.5  8.8  9.1       Latest Ref Rng & Units 06/11/2018    8:19 AM 03/04/2018  4:14 PM 06/27/2017    8:10 AM  Hepatic Function  Total Protein 6.0 - 8.5 g/dL 6.2  6.7  6.5   Albumin 3.5 - 4.8 g/dL 4.4  4.3  4.5   AST 0 - 40 IU/L 24  40  29   ALT 0 - 44 IU/L 20  25  24    Alk Phosphatase 39 - 117 IU/L 91  93  100   Total Bilirubin 0.0 - 1.2 mg/dL 0.9  1.3  1.2       Latest Ref Rng & Units 10/15/2018   12:28 PM 09/30/2018    4:55 AM 09/29/2018    5:40 AM  CBC  WBC 3.4 - 10.8 x10E3/uL 4.2  4.7  4.5   Hemoglobin 13.0 - 17.7 g/dL 41.3  24.4  01.0   Hematocrit 37.5 - 51.0  % 40.4  35.7  37.7   Platelets 150 - 450 x10E3/uL 184  117  123    Lab Results  Component Value Date   MCV 95 10/15/2018   MCV 94.2 09/30/2018   MCV 93.8 09/29/2018   Lab Results  Component Value Date   TSH 1.870 06/11/2018   Lab Results  Component Value Date   HGBA1C 5.5 06/27/2017    Lipid Panel     Component Value Date/Time   CHOL 110 09/28/2018 0046   CHOL 125 06/11/2018 0819   CHOL 127 11/09/2015 0854   CHOL 107 02/12/2014 0801   TRIG 37 09/28/2018 0046   TRIG 57 11/09/2015 0854   TRIG 37 02/12/2014 0801   HDL 51 09/28/2018 0046   HDL 55 06/11/2018 0819   HDL 55 11/09/2015 0854   HDL 57 02/12/2014 0801   CHOLHDL 2.2 09/28/2018 0046   VLDL 7 09/28/2018 0046   LDLCALC 52 09/28/2018 0046   LDLCALC 59 06/11/2018 0819   LDLCALC 61 11/09/2015 0854   LDLCALC 43 02/12/2014 0801    RADIOLOGY: No results found.  IMPRESSION:  1. Coronary artery disease involving native coronary artery of native heart without angina pectoris   2. Hx of CABG   3. Primary hypertension   4. Mixed hyperlipidemia   5. Aortic valve sclerosis   6. Peripheral polyneuropathy   7. Type 2 diabetes mellitus with diabetic neuropathy, without long-term current use of insulin [facility])     ASSESSMENT AND PLAN: Mr. Finnlee Kuhnle is a 77 year old white male who underwent CABG revascularization surgery in 1997 at age 89; and is status post initial stenting of the graft to the circumflex vessel in 2004. In December 2009 he underwent stenting of his left main into the superior ramus branch and stenting to the vein graft supplying the RCA. The graft to the circumflex was occluded  At catheterization in March 2013 the stent to the left main/ramus vessel was widely patent as was the stent to the vein graft to the right coronary artery. His proximal LAD was being supplied by his diagonal vessel and the mid LAD was supplied by the LIMA graft.  His nuclear perfusion study  in March 2015 showed normal perfusion  without scar or ischemia on his medical therapy.  He developed atrial flutter in 2018 which was isthmus dependent right atrial flutter and was successfully ablated.  He was on eliquis anticoagulation for one month.  When I subsequently saw him, he had been on aspirin alone and with his left main stent I also recommended resumption of Plavix.  He has been stable without significant previous anginal symptoms on a medical  regimen consisting of carvedilol 6.25 mg twice a day, isosorbide 30 mg, ranolazine 1000 mg twice a day in addition to his losartan.  Cardiac catheterization findings in January 2020 were not significantly changed from 2013 evaluation. The left main stent is widely patent.  Presently, he has been without anginal symptomatology.  When  seen in May 2022 at that time I recommended he undergo a Lexiscan Myoview study in light of some shortness of breath.  This remained stable without scar or ischemia with EF at 63%.  He continues to be on long-term DAPT.  His blood pressure remained stable on carvedilol 6.25 mg twice a day, in addition to his isosorbide 60 mg, losartan 25 mg and spironolactone 12.5 mg.  He is not having any anginal symptoms and continues to be on Ranexa 1000 mg twice a day.  Presently, he was questioning about omega-3 fatty acid and I presented with him some data regarding the reduce it trial with Vascepa which may be preferable to his Lovaza.  He wishes to switch and I will initiate Vascepa 2 g twice a day.  He is followed at [name] for his diabetes and is on Jardiance in addition to Actos.  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  He also is on Nucynta ER.  Viewed his most recent echo Doppler study which continues to demonstrate normal LV function with EF at 60 to 65%.  On exam he does have an aortic sclerosis murmur.  This will not significantly progress into aortic stenosis.  I discussed with him my likely plans for retirement  next year.  With his complex CAD history with subsequent interventions and his aortic valve sclerosis, I have suggested that upon my retirement he transition to Dr. Tonny Bollman for cardiology follow-up in one year.   Lennette Bihari, MD, [facility]  07/24/2023 6:14 PM

## 2023-07-18 NOTE — Patient Instructions (Signed)
Medication Instructions:  Your refills have been sent to your local pharmacy *If you need a refill on your cardiac medications before your next appointment, please call your pharmacy*   Lab Work: None If you have labs (blood work) drawn today and your tests are completely normal, you will receive your results only by: MyChart Message (if you have MyChart) OR A paper copy in the mail If you have any lab test that is abnormal or we need to change your treatment, we will call you to review the results.   Testing/Procedures: Your physician has requested that you have an echocardiogram. Echocardiography is a painless test that uses sound waves to create images of your heart. It provides your doctor with information about the size and shape of your heart and how well your heart's chambers and valves are working. This procedure takes approximately one hour. There are no restrictions for this procedure. Please do NOT wear cologne, perfume, aftershave, or lotions (deodorant is allowed).   Please arrive 15 minutes prior to your appointment time.    Follow-Up: At [facility], you and your health needs are our priority.  As part of our continuing mission to provide you with exceptional heart care, we have created designated Provider Care Teams.  These Care Teams include your primary Cardiologist (physician) and Advanced Practice Providers (APPs -  Physician Assistants and Nurse Practitioners) who all work together to provide you with the care you need, when you need it.  We recommend signing up for the patient portal called "MyChart".  Sign up information is provided on this After Visit Summary.  MyChart is used to connect with patients for Virtual Visits (Telemedicine).  Patients are able to view lab/test results, encounter notes, upcoming appointments, etc.  Non-urgent messages can be sent to your provider as well.   To learn more about what you can do with MyChart, go to  [email or web address].    Your next appointment:   1 year(s)  Provider:   Dr. Tonny Bollman

## 2023-07-22 ENCOUNTER — Encounter: Payer: Self-pay | Admitting: Cardiovascular Disease

## 2023-07-22 MED ORDER — ICOSAPENT ETHYL 1 G PO CAPS: 2.0000 g | ORAL_CAPSULE | Freq: Two times a day (BID) | ORAL | 3 refills | Status: DC

## 2023-07-24 ENCOUNTER — Encounter: Payer: Self-pay | Admitting: Cardiovascular Disease

## 2023-07-25 NOTE — Progress Notes (Signed)
I saw Ruben [name] in neurology clinic on 08/01/23 in follow up for neuropathy.  HPI: Ruben [name] is a 77 y.o. year old male with a history of right-handed male with a medical history of chronic pain (on Nucynta), HTN, DM, CAD s/p CABG and PCI, spondylosis s/p C3-C7 fusion (~2000) who we last saw on 07/27/22.  To briefly review: Patient has C3-C7 fusion for right arm symptoms around 2000. After surgery, he began noticing pain in bilateral feet. Patient saw neurology at [facility] many years ago. Had an EMG at [facility] in 2001 for progressive numbness and burning in feet s/p tarsal tunnel release in right foot that was consistent with PN (see report below - from care every where).   He current levels of pain are generally around 2/10. When he as flairs he has shooting electric pains and grabbing sensation in his calves. He has tingling in his legs. He has numbness and can have skin sensitivity. He gets a flair about once per month that will last overnight. He gets it back under control with sleep, hemp oil or an extra small dose of Lyrica or oxycodone.   He has had one fall this year. He tripped going up the stairs. Patient got a Optician, dispensing from [employer] about new treatments for neuropathy (chiropractors). He went for 2 visits. They offered LED light treatment (patient had tried this before and it helped with balance), a treatment to wake up his nerves, and an anti-inflammatory diet. He was told they could improve his legs by 40% for $7000.   The primary reason patient is here to discuss new innovations for people with neuropathy and wanted another opinion before spending $7000. Patient is interested in checking his B6 and B12 as well. Patient would also like to know if his pain medications are appropriate. Patient is on Lyrica 200 mg TID. He also is on opioids. He sees pain management for this.   Prior medications that have been tried: gabapentin, Cymbalta, creams (did not help or did not  tolerate)   He takes a b complex (Metanx) and Hemp oil   EtOH use: very rare  Restrictive diet? Gluten free Family history of neuropathy/myopathy/NM disease? No  Most recent Assessment and Plan (07/27/22): Ruben [name] is a 77 y.o. male who presents for evaluation of numbness and tingling. He has a relevant medical history of chronic pain (on Nucynta), HTN, DM, CAD s/p CABG and PCI, spondylosis s/p C3-C7 fusion (~2000). His neurological examination is pertinent for diminished sensation in bilateral lower extremities in a length dependent pattern. 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 Most recent HbA1c was 5.6 and B12 > 1500. 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 Overall, his symptoms seem well controlled on Lyrica 200 mg TID. I will send labs to make sure there are no other contributing factors.   PLAN: -Blood work: B6, B1, IFE -Continue to control blood sugars -Alpha lipoic acid 600 mg BID -Continue Lyrica 200 mg TID -Discussed long term effects of opioids and the potential to make pain worse  Since their last visit: Labs showed elevated B1 and B6, likely due to patient taking B complex. I recommended he discontinue B complex to avoid B6 toxicity. He has stopped this. He is taking alpha lipoic acid daily. He takes Lyrica 200 mg TID. He feels this is helping and is happy with current pain control.  Patient  continues to have numbness and tingling. His symptoms have not significantly changed. He fell once since last visit. He was walking on concrete and caught a gap in the concrete, his right foot caught it, and he fell. He had a skinned up knee. He continues to have imbalance unless he pays attention closely.  Twice since our last visit, he has had an episode that he had difficulty moving his right leg, requiring him to think about it before it would move.  Once he got moving, his leg worked fine. He denies tremors. His sense of smell is good. He denies any symptoms of orthostatic hypotension. He denies symptoms consistent with REM sleep disorder. He denies pseudobulbar affect.   MEDICATIONS:  Outpatient Encounter Medications as of 08/01/2023  Medication Sig   aspirin EC 81 MG tablet Take 1 tablet (81 mg total) by mouth daily.   carvedilol (COREG) 6.25 MG tablet Take 1 tablet (6.25 mg total) by mouth 2 (two) times daily with a meal.   Cholecalciferol (VITAMIN D3) 5000 units CAPS Take 5,000 Units by mouth See admin instructions. Take 5,000 units by mouth once a day and rotate with 10,000 units every other day   clopidogrel (PLAVIX) 75 MG tablet Take 1 tablet (75 mg total) by mouth daily.   Coenzyme Q10 (CO Q-10 PO) Take 2 capsules by mouth daily.   FERROUS GLUCONATE-C-FOLIC ACID PO Take 1 capsule by mouth daily in the afternoon.   icosapent Ethyl (VASCEPA) 1 g capsule Take 2 capsules (2 g total) by mouth 2 (two) times daily.   isosorbide mononitrate (IMDUR) 60 MG 24 hr tablet TAKE 1 TABLET(60 MG) BY MOUTH DAILY   JARDIANCE 10 MG TABS tablet Take 10 mg by mouth daily.   losartan (COZAAR) 25 MG tablet Take 1 tablet (25 mg total) by mouth daily.   magnesium aspartate (MAGINEX) 615 MG tablet Take 120 mg by mouth 2 (two) times daily.   nitroGLYCERIN (NITROLINGUAL) 0.4 MG/SPRAY spray Place 1 spray under the tongue every 5 (five) minutes x 3 doses as needed for chest pain.   pioglitazone (ACTOS) 45 MG tablet Take 45 mg by mouth daily.   pregabalin (LYRICA) 200 MG capsule Take 200 mg by mouth 3 (three) times daily.    Quercetin 250 MG TABS Take 500 mg by mouth QID.   ranolazine (RANEXA) 1000 MG SR tablet Take 1 tablet (1,000 mg total) by mouth 2 (two) times daily.   rosuvastatin (CRESTOR) 40 MG tablet Take 1 tablet (40 mg total) by mouth daily.   spironolactone (ALDACTONE) 25 MG tablet TAKE 1/2 TABLET(12.5 MG) BY MOUTH DAILY   Turmeric 500 MG TABS Take  1,000 mg by mouth daily.   VITAMIN A PO Take by mouth.   vitamin E 180 MG (400 UNITS) capsule Take 400 Units by mouth daily.   Black Pepper-Turmeric (TURMERIC CURCUMIN) 01-999 MG CAPS  (Patient not taking: Reported on 08/01/2023)   NUCYNTA ER 200 MG TB12 Take 200 mg by mouth 2 (two) times daily.   No facility-administered encounter medications on file as of 08/01/2023.    PAST MEDICAL HISTORY: Past Medical History:  Diagnosis Date   Atrial flutter (HCC) 02/02/2017   CAD, multiple vessel    Diabetes mellitus (HCC)    Dyslipidemia    Hypertension     PAST SURGICAL HISTORY: Past Surgical History:  Procedure Laterality Date   A-FLUTTER ABLATION N/A 02/05/2017   Procedure: A-Flutter Ablation;  Surgeon: Hillis Range, MD;  Location: [facility] INVASIVE CV LAB;  Service: Cardiovascular;  Laterality: N/A;   CARDIAC CATHETERIZATION  12/13/2011   No intervention - recommend medical therapy with increased medical trial   CARDIOVASCULAR STRESS TEST  10/26/2010   No scintigraphic evidence of inducible myocardial ischemia. No ECG changes. EKG negative for ischemia.   CORONARY ARTERY BYPASS GRAFT  1997   LIMA-LAD, vein graft to diagonal, vein graft to obtuse marginal, and vein graft to RCA   LEFT HEART CATH AND CORS/GRAFTS ANGIOGRAPHY N/A 09/29/2018   Procedure: LEFT HEART CATH AND CORS/GRAFTS ANGIOGRAPHY;  Surgeon: Marykay Lex, MD;  Location: [facility] INVASIVE CV LAB;  Service: Cardiovascular;  Laterality: N/A;   LEFT HEART CATHETERIZATION WITH CORONARY/GRAFT ANGIOGRAM N/A 12/13/2011   Procedure: LEFT HEART CATHETERIZATION WITH Isabel Caprice;  Surgeon: Lennette Bihari, MD;  Location[facility] CATH LAB;  Service: Cardiovascular;  Laterality: N/A;   LOWER EXTREMITY ARTERIAL DOPPLER  01/30/2000   Normal arterial study   TEE WITHOUT CARDIOVERSION N/A 02/04/2017   Procedure: TRANSESOPHAGEAL ECHOCARDIOGRAM (TEE);  Surgeon: Lars Masson, MD;  Location: [facility] ENDOSCOPY;  Service: Cardiovascular;  Laterality: N/A;    TRANSTHORACIC ECHOCARDIOGRAM  12/12/2011   EF >55%, LA severely dilated.   ULTRASOUND GUIDANCE FOR VASCULAR ACCESS  09/29/2018   Procedure: Ultrasound Guidance For Vascular Access;  Surgeon: Marykay Lex, MD;  Location: [facility] INVASIVE CV LAB;  Service: Cardiovascular;;    ALLERGIES: Allergies  Allergen Reactions   Nortriptyline Other (See Comments)    Groggy at low dose   Cymbalta [Duloxetine Hcl] Diarrhea    FAMILY HISTORY: Family History  Problem Relation Age of Onset   Heart disease Father    Diabetes Father    Hyperlipidemia Sister     SOCIAL HISTORY: Social History   Tobacco Use   Smoking status: Never   Smokeless tobacco: Never  Vaping Use   Vaping status: Never Used  Substance Use Topics   Alcohol use: Yes    Comment: occas   Drug use: Yes   Social History   Social History Narrative   Are you right handed or left handed? right   Are you currently employed ? retired   Caffeine 1 soda a day   Do you live at home alone?wife      What type of home do you live in: 1 story or 2 story? two        Objective:  Vital Signs:  BP 122/63   Pulse 65   Ht 5\' 8"  (1.727 m)   Wt 206 lb (93.4 kg)   SpO2 97%   BMI 31.32 kg/m   General: General appearance: Awake and alert. No distress. Cooperative with exam.  Skin: No obvious rash or jaundice. HEENT: Atraumatic. Anicteric. Lungs: Non-labored breathing on room air  Extremities: No edema. No obvious deformity.   Neurological: Mental Status: Alert. Speech fluent. No pseudobulbar affect Cranial Nerves: CNII: No RAPD. Visual fields intact. CNIII, IV, VI: PERRL. No nystagmus. EOMI. CN V: Facial sensation intact bilaterally to fine touch. CN VII: Facial muscles symmetric and strong. No ptosis at rest. CN VIII: Hears finger rub well bilaterally. CN IX: No hypophonia. CN X: Palate elevates symmetrically. CN XI: Full strength shoulder shrug bilaterally. CN XII: Tongue protrusion full and midline. No atrophy or  fasciculations. No significant dysarthria Motor: Tone is normal. Strength is 5/5 in bilateral upper and lower extremities. Reflexes:  Right Left  Bicep 2+ 2+  Tricep 2+ 2+  BrRad 2+ 2+  Knee 2+ 2+  Ankle 0 0   Sensation: Pinprick:  Intact in upper extremities. Absent to bilateral ankles in lower extremities  Vibration: Absent in bilateral great toes, diminished in bilateral ankles, intact in patella. Intact in upper extremities. Proprioception: Absent in bilateral great toes Coordination: Intact finger-to- nose-finger and heel-to-shin bilaterally. Romberg with mild sway. Finger tapping with mild decrement. Difficulty with toe tapping in RLE Gait: Able to rise from chair with arms crossed unassisted. Normal, narrow-based gait.   Lab and Test Review: New results: 07/28/23: B1: 48 (elevated) B6: 53.4 (elevated) IFE: No M protein  External labs (12/20/22): B12: 622 Vit D wnl HbA1c: 5.7 Lipid panel wnl  Previously reviewed results: External labs: Normal or unremarkable: CMP, TSH, vit D B12: > 1500 (12/12/21) HbA1c 5.6   MRI cervical spine wo contrast (07/01/98): IMPRESSION  1.  SMALL FOCAL CENTRAL DISC PROTRUSION, C7-T1.  2.  HARD AND SOFT DISC C3-4 CENTRAL AND TO THE LEFT WITH LEFT C-4 NERVE ROOT ENCROACHMENT.  3.  CHANGES OF DIFFUSE MULTILEVEL SPONDYLOSIS FROM C-4 THROUGH C-7 AS DESCRIBED, WITH BORDERLINE  CANAL STENOSIS BUT NO SEVERE CORD FLATTENING - SEE REPORT.   EMG (10/13/1999): SUMMARY:     NCS:  Left peroneal, median and ulnar motor responses revealed  slowed conduction velocity and prolonged F-waves.  Left tibial motor  response showed reduced amplitude, slowed velocity and prolonged  F-waves.  Left sural and bilateral medial and lateral plantar  sensory responses were unobtainable.  Left median mixed responses  were normal.  Left median and ulnar digital sensory responses had  reduced amplitude.     EMG:  Needle exam of selected left lower extremity muscles   revealed complex motor units distally.   CONCLUSIONS:     This is an abnormal study.  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  Due to the  severity of the peripheral neuropathy, presence of a superimposed  medial and lateral plantar neuropathy as seen in tarsal tunnel  syndrome cannot be determined. There is also no evidence of other  entrapment neuropathies in left upper extremity.   ASSESSMENT: This is Ruben [name], a 77 y.o. male with numbness and tingling in bilateral lower extremities. He has a relevant medical history of chronic pain (on Nucynta), HTN, DM, CAD s/p CABG and PCI, spondylosis s/p C3-C7 fusion (~2000). His neurological examination is pertinent for diminished sensation in bilateral lower extremities in a length dependent pattern. Patient mentioned difficulty moving his right leg on a couple of occasions. There is no definitive evidence of parkinsonism today. I will continue to monitor though. These symptoms could be due to his neuropathy and poor proprioception, but that is not currently clear. 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 Labs were significant for mildly elevated B6 due to B complex. He has since discontinued the B complex supplement. His pain symptoms are currently well controlled on Nucynta and Lyrica 200 mg TID.  Plan: -Alpha lipoic acid 600 mg BID -Continue Lyrica 200 mg TID -Discussed daily foot care -Discuss fall precautions  Return to clinic in 6 months  Total time spent reviewing records, interview, history/exam, documentation, and coordination of care on day of encounter:  40 min  Jacquelyne Balint, MD

## 2023-07-31 DIAGNOSIS — M9903 Segmental and somatic dysfunction of lumbar region: Secondary | ICD-10-CM | POA: Diagnosis not present

## 2023-07-31 DIAGNOSIS — M9904 Segmental and somatic dysfunction of sacral region: Secondary | ICD-10-CM | POA: Diagnosis not present

## 2023-07-31 DIAGNOSIS — M9902 Segmental and somatic dysfunction of thoracic region: Secondary | ICD-10-CM | POA: Diagnosis not present

## 2023-07-31 DIAGNOSIS — M9901 Segmental and somatic dysfunction of cervical region: Secondary | ICD-10-CM | POA: Diagnosis not present

## 2023-08-01 ENCOUNTER — Ambulatory Visit (INDEPENDENT_AMBULATORY_CARE_PROVIDER_SITE_OTHER): Payer: Medicare Other | Admitting: Neurology

## 2023-08-01 ENCOUNTER — Encounter: Payer: Self-pay | Admitting: Neurology

## 2023-08-01 VITALS — BP 122/63 | HR 65 | Ht 68.0 in | Wt 206.0 lb

## 2023-08-01 DIAGNOSIS — R2689 Other abnormalities of gait and mobility: Secondary | ICD-10-CM

## 2023-08-01 DIAGNOSIS — E1142 Type 2 diabetes mellitus with diabetic polyneuropathy: Secondary | ICD-10-CM | POA: Diagnosis not present

## 2023-08-01 DIAGNOSIS — R209 Unspecified disturbances of skin sensation: Secondary | ICD-10-CM | POA: Diagnosis not present

## 2023-08-01 DIAGNOSIS — R2681 Unsteadiness on feet: Secondary | ICD-10-CM | POA: Diagnosis not present

## 2023-08-01 NOTE — Patient Instructions (Signed)
-Alpha lipoic acid 600 mg BID -Continue Lyrica 200 mg TID  Please examine you feet daily.  Return to clinic in 6 months or sooner if needed.  The physicians and staff at [facility] are committed to providing excellent care. You may receive a survey requesting feedback about your experience at our office. We strive to receive "very good" responses to the survey questions. If you feel that your experience would prevent you from giving the office a "very good " response, please contact our office to try to remedy the situation. We may be reached at [phone]. Thank you for taking the time out of your busy day to complete the survey.  Jacquelyne Balint, MD [facility]  Preventing Falls at [facility] are common, often dreaded events in the lives of older people. Aside from the obvious injuries and even death that may result, fall can cause wide-ranging consequences including loss of independence, mental decline, decreased activity and mobility. Younger people are also at risk of falling, especially those with chronic illnesses and fatigue.  Ways to reduce risk for falling Examine diet and medications. Warm foods and alcohol dilate blood vessels, which can lead to dizziness when standing. Sleep aids, antidepressants and pain medications can also increase the likelihood of a fall.  Get a vision exam. Poor vision, cataracts and glaucoma increase the chances of falling.  Check foot gear. Shoes should fit snugly and have a sturdy, nonskid sole and a broad, low heel  Participate in a physician-approved exercise program to build and maintain muscle strength and improve balance and coordination. Programs that use ankle weights or stretch bands are excellent for muscle-strengthening. Water aerobics programs and low-impact Tai Chi programs have also been shown to improve balance and coordination.  Increase vitamin D intake. Vitamin D improves muscle strength and increases the amount of calcium  the body is able to absorb and deposit in bones.  How to prevent falls from common hazards Floors - Remove all loose wires, cords, and throw rugs. Minimize clutter. Make sure rugs are anchored and smooth. Keep furniture in its usual place.  Chairs -- Use chairs with straight backs, armrests and firm seats. Add firm cushions to existing pieces to add height.  Bathroom - Install grab bars and non-skid tape in the tub or shower. Use a bathtub transfer bench or a shower chair with a back support Use an elevated toilet seat and/or safety rails to assist standing from a low surface. Do not use towel racks or bathroom tissue holders to help you stand.  Lighting - Make sure halls, stairways, and entrances are well-lit. Install a night light in your bathroom or hallway. Make sure there is a light switch at the top and bottom of the staircase. Turn lights on if you get up in the middle of the night. Make sure lamps or light switches are within reach of the bed if you have to get up during the night.  Kitchen - Install non-skid rubber mats near the sink and stove. Clean spills immediately. Store frequently used utensils, pots, pans between waist and eye level. This helps prevent reaching and bending. Sit when getting things out of lower cupboards.  Living room/ Bedrooms - Place furniture with wide spaces in between, giving enough room to move around. Establish a route through the living room that gives you something to hold onto as you walk.  Stairs - Make sure treads, rails, and rugs are secure. Install a rail on both sides of the stairs.  If stairs are a threat, it might be helpful to arrange most of your activities on the lower level to reduce the number of times you must climb the stairs.  Entrances and doorways - Install metal handles on the walls adjacent to the doorknobs of all doors to make it more secure as you travel through the doorway.  Tips for maintaining balance Keep at least one hand free at  all times. Try using a backpack or fanny pack to hold things rather than carrying them in your hands. Never carry objects in both hands when walking as this interferes with keeping your balance.  Attempt to swing both arms from front to back while walking. This might require a conscious effort if Parkinson's disease has diminished your movement. It will, however, help you to maintain balance and posture, and reduce fatigue.  Consciously lift your feet off of the ground when walking. Shuffling and dragging of the feet is a common culprit in losing your balance.  When trying to navigate turns, use a "U" technique of facing forward and making a wide turn, rather than pivoting sharply.  Try to stand with your feet shoulder-length apart. When your feet are close together for any length of time, you increase your risk of losing your balance and falling.  Do one thing at a time. Don't try to walk and accomplish another task, such as reading or looking around. The decrease in your automatic reflexes complicates motor function, so the less distraction, the better.  Do not wear rubber or gripping soled shoes, they might "catch" on the floor and cause tripping.  Move slowly when changing positions. Use deliberate, concentrated movements and, if needed, use a grab bar or walking aid. Count 15 seconds between each movement. For example, when rising from a seated position, wait 15 seconds after standing to begin walking.  If balance is a continuous problem, you might want to consider a walking aid such as a cane, walking stick, or walker. Once you've mastered walking with help, you might be ready to try it on your own again.

## 2023-08-02 DIAGNOSIS — G609 Hereditary and idiopathic neuropathy, unspecified: Secondary | ICD-10-CM | POA: Diagnosis not present

## 2023-08-02 DIAGNOSIS — G894 Chronic pain syndrome: Secondary | ICD-10-CM | POA: Diagnosis not present

## 2023-08-23 ENCOUNTER — Telehealth: Payer: Self-pay | Admitting: Cardiology

## 2023-08-23 ENCOUNTER — Other Ambulatory Visit: Payer: Self-pay

## 2023-08-23 ENCOUNTER — Encounter (HOSPITAL_COMMUNITY): Payer: Self-pay

## 2023-08-23 ENCOUNTER — Emergency Department (HOSPITAL_COMMUNITY)
Admission: EM | Admit: 2023-08-23 | Discharge: 2023-08-23 | Disposition: A | Discharge status: Home / Self Care | Payer: Medicare Other | Attending: Emergency Medicine | Admitting: Emergency Medicine

## 2023-08-23 ENCOUNTER — Ambulatory Visit (HOSPITAL_COMMUNITY)
Admission: EM | Admit: 2023-08-23 | Discharge: 2023-08-23 | Disposition: A | Discharge status: Home / Self Care | Payer: Medicare Other | Attending: Internal Medicine | Admitting: Internal Medicine

## 2023-08-23 DIAGNOSIS — R5383 Other fatigue: Secondary | ICD-10-CM | POA: Diagnosis not present

## 2023-08-23 DIAGNOSIS — R42 Dizziness and giddiness: Secondary | ICD-10-CM | POA: Diagnosis not present

## 2023-08-23 DIAGNOSIS — R0602 Shortness of breath: Secondary | ICD-10-CM | POA: Diagnosis not present

## 2023-08-23 DIAGNOSIS — R110 Nausea: Secondary | ICD-10-CM

## 2023-08-23 DIAGNOSIS — Z7982 Long term (current) use of aspirin: Secondary | ICD-10-CM | POA: Diagnosis not present

## 2023-08-23 LAB — BASIC METABOLIC PANEL
Anion gap: 9 (ref 5–15)
BUN: 16 mg/dL (ref 8–23)
CO2: 25 mmol/L (ref 22–32)
Calcium: 9.1 mg/dL (ref 8.9–10.3)
Chloride: 103 mmol/L (ref 98–111)
Creatinine, Ser: 0.86 mg/dL (ref 0.61–1.24)
GFR, Estimated: >60 mL/min (ref >60)
Glucose, Bld: 124 mg/dL — ABNORMAL HIGH (ref 70–99)
Potassium: 4.4 mmol/L (ref 3.5–5.1)
Sodium: 137 mmol/L (ref 135–145)

## 2023-08-23 LAB — POCT URINALYSIS DIP (MANUAL ENTRY)
Bilirubin, UA: NEGATIVE
Blood, UA: NEGATIVE
Glucose, UA: >=1000 mg/dL — AB
Ketones, POC UA: NEGATIVE mg/dL
Leukocytes, UA: NEGATIVE
Nitrite, UA: NEGATIVE
Protein Ur, POC: NEGATIVE mg/dL
Spec Grav, UA: 1.02 (ref 1.010–1.025)
Urobilinogen, UA: 0.2 U/dL
pH, UA: 5 (ref 5.0–8.0)

## 2023-08-23 LAB — CBC
HCT: 44.2 % (ref 39.0–52.0)
Hemoglobin: 14.4 g/dL (ref 13.0–17.0)
MCH: 29.7 pg (ref 26.0–34.0)
MCHC: 32.6 g/dL (ref 30.0–36.0)
MCV: 91.1 fL (ref 80.0–100.0)
Platelets: 193 10*3/uL (ref 150–400)
RBC: 4.85 MIL/uL (ref 4.22–5.81)
RDW: 15.9 % — ABNORMAL HIGH (ref 11.5–15.5)
WBC: 5.6 10*3/uL (ref 4.0–10.5)
nRBC: 0 % (ref 0.0–0.2)

## 2023-08-23 LAB — POCT FASTING CBG KUC MANUAL ENTRY: POCT Glucose (KUC): 119 mg/dL — AB (ref 70–99)

## 2023-08-23 LAB — TROPONIN I (HIGH SENSITIVITY)
Troponin I (High Sensitivity): 5 ng/L (ref <18)
Troponin I (High Sensitivity): 4 ng/L (ref <18)

## 2023-08-23 LAB — BRAIN NATRIURETIC PEPTIDE: B Natriuretic Peptide: 118 pg/mL — ABNORMAL HIGH (ref 0.0–100.0)

## 2023-08-23 NOTE — ED Provider Notes (Signed)
[place] EMERGENCY DEPARTMENT AT [facility] Provider Note   CSN: [id number] Arrival date & time: 08/23/23  1055     History  Chief Complaint  Patient presents with   Fatigue   Dizziness   Nausea    Ruben [name] is a 77 y.o. male.  77 yo M with a chief complaints of fatigue.  He tells me that he has been very tired first thing in the morning and then later in the afternoon.  He usually lays down and takes a nap for about 30 minutes and then feels better.  He denies cough congestion or fever.  He feels like he has been sweating more especially from his axilla bilaterally.  He has felt hot and cold at times.  He denies chest pain.  He did not have any difficulty breathing until last night when he was trying to go to bed and he felt like he had a little bit of trouble.  He feels like he is been eating and drinking normally.  He had his omega fatty acids changed but otherwise denies any recent medication changes.  Denies abdominal pain.  He said that he was told by his cardiologist that he is something wrong with his aortic valve and that it likely needs to be repaired in the next year.  He is worried that maybe this is symptoms from his heart valve.   Dizziness      Home Medications Prior to Admission medications   Medication Sig Start Date End Date Taking? Authorizing Provider  aspirin EC 81 MG tablet Take 1 tablet (81 mg total) by mouth daily. 03/07/17   Allred, Fayrene Fearing, MD  Black Pepper-Turmeric (TURMERIC CURCUMIN) 01-999 MG CAPS     [provider]  carvedilol (COREG) 6.25 MG tablet Take 1 tablet (6.25 mg total) by mouth 2 (two) times daily with a meal. 01/14/23   Lennette Bihari, MD  Cholecalciferol (VITAMIN D3) 5000 units CAPS Take 5,000 Units by mouth See admin instructions. Take 5,000 units by mouth once a day and rotate with 10,000 units every other day    [provider]  clopidogrel (PLAVIX) 75 MG tablet Take 1 tablet (75 mg total) by mouth  daily. 07/18/23   Lennette Bihari, MD  Coenzyme Q10 (CO Q-10 PO) Take 2 capsules by mouth daily.    [provider]  FERROUS GLUCONATE-C-FOLIC ACID PO Take 1 capsule by mouth daily in the afternoon.    [provider]  icosapent Ethyl (VASCEPA) 1 g capsule Take 2 capsules (2 g total) by mouth 2 (two) times daily. 07/22/23   Lennette Bihari, MD  isosorbide mononitrate (IMDUR) 60 MG 24 hr tablet TAKE 1 TABLET(60 MG) BY MOUTH DAILY 07/18/23   Lennette Bihari, MD  JARDIANCE 10 MG TABS tablet Take 10 mg by mouth daily.    [provider]  losartan (COZAAR) 25 MG tablet Take 1 tablet (25 mg total) by mouth daily. 07/18/23   Lennette Bihari, MD  magnesium aspartate (MAGINEX) 615 MG tablet Take 120 mg by mouth 2 (two) times daily.    [provider]  nitroGLYCERIN (NITROLINGUAL) 0.4 MG/SPRAY spray Place 1 spray under the tongue every 5 (five) minutes x 3 doses as needed for chest pain. 07/18/23   Lennette Bihari, MD  NUCYNTA ER 200 MG TB12 Take 200 mg by mouth 2 (two) times daily. 12/05/17   [provider]  pioglitazone (ACTOS) 45 MG tablet Take 45 mg by  mouth daily.    [provider]  pregabalin (LYRICA) 200 MG capsule Take 200 mg by mouth 3 (three) times daily.     [provider]  Quercetin 250 MG TABS Take 500 mg by mouth QID.    [provider]  ranolazine (RANEXA) 1000 MG SR tablet Take 1 tablet (1,000 mg total) by mouth 2 (two) times daily. 07/18/23   Lennette Bihari, MD  rosuvastatin (CRESTOR) 40 MG tablet Take 1 tablet (40 mg total) by mouth daily. 07/18/23   Lennette Bihari, MD  spironolactone (ALDACTONE) 25 MG tablet TAKE 1/2 TABLET(12.5 MG) BY MOUTH DAILY 07/18/23   Lennette Bihari, MD  Turmeric 500 MG TABS Take 1,000 mg by mouth daily.    [provider]  VITAMIN A PO Take by mouth.    [provider]  vitamin E 180 MG (400 UNITS) capsule Take 400 Units by mouth daily.    [provider]       Allergies    Nortriptyline and Cymbalta [duloxetine hcl]    Review of Systems   Review of Systems  Neurological:  Positive for dizziness.    Physical Exam Updated Vital Signs BP 129/62   Pulse (!) 53   Temp 98 F (36.7 C) (Oral)   Resp 11   SpO2 99%  Physical Exam Vitals and nursing note reviewed.  Constitutional:      Appearance: He is well-developed.  HENT:     Head: Normocephalic and atraumatic.  Eyes:     Pupils: Pupils are equal, round, and reactive to light.  Neck:     Vascular: No JVD.  Cardiovascular:     Rate and Rhythm: Normal rate and regular rhythm.     Heart sounds: No murmur heard.    No friction rub. No gallop.  Pulmonary:     Effort: No respiratory distress.     Breath sounds: No wheezing.  Abdominal:     General: There is no distension.     Tenderness: There is no abdominal tenderness. There is no guarding or rebound.  Musculoskeletal:        General: Normal range of motion.     Cervical back: Normal range of motion and neck supple.  Skin:    Coloration: Skin is not pale.     Findings: No rash.  Neurological:     Mental Status: He is alert and oriented to person, place, and time.  Psychiatric:        Behavior: Behavior normal.     ED Results / Procedures / Treatments   Labs (all labs ordered are listed, but only abnormal results are displayed) Labs Reviewed  BASIC METABOLIC PANEL - Abnormal; Notable for the following components:      Result Value   Glucose, Bld 124 (*)    All other components within normal limits  CBC - Abnormal; Notable for the following components:   RDW 15.9 (*)    All other components within normal limits  BRAIN NATRIURETIC PEPTIDE  TROPONIN I (HIGH SENSITIVITY)  TROPONIN I (HIGH SENSITIVITY)    EKG None  Radiology No results found.  Procedures Procedures    Medications Ordered in ED Medications - No data to display  ED Course/ Medical Decision Making/ A&P                                  Medical Decision Making Amount and/or Complexity of  Data Reviewed Labs: ordered.   77 yo M with a chief complaints of fatigue.  This been going on for about 3 days.  Seems to be worse in the morning and then in the afternoon.  He takes a nap and things tend to get better.  His lab work here is unremarkable, 2 troponins are negative, no anemia, no significant electrolyte abnormalities.  His EKG is nonischemic on my independent interpretation.  When I reviewed the patient's medical record I do not see any documentation that he has an impending need for a heart valve replacement.  I did discuss this with the cardiologist on-call Dr. Servando Salina, she also reviewed the medical record and thought that acute worsening that would require emergent intervention was unlikely.  She recommended follow-up as an outpatient.  5:31 PM:  I have discussed the diagnosis/risks/treatment options with the patient and family.  Evaluation and diagnostic testing in the emergency department does not suggest an emergent condition requiring admission or immediate intervention beyond what has been performed at this time.  They will follow up with Cards. We also discussed returning to the ED immediately if new or worsening sx occur. We discussed the sx which are most concerning (e.g., sudden worsening pain, fever, inability to tolerate by mouth) that necessitate immediate return. Medications administered to the patient during their visit and any new prescriptions provided to the patient are listed below.  Medications given during this visit Medications - No data to display   The patient appears reasonably screen and/or stabilized for discharge and I doubt any other medical condition or other [facility] requiring further screening, evaluation, or treatment in the ED at this time prior to discharge.          Final Clinical Impression(s) / ED Diagnoses Final diagnoses:  Fatigue, unspecified type    Rx / DC Orders ED Discharge Orders           Ordered    Ambulatory referral to Cardiology       Comments: If you have not heard from the Cardiology office within the next 72 hours please call [phone].   08/23/23 1731              Melene Plan, DO 08/23/23 1731

## 2023-08-23 NOTE — Discharge Instructions (Signed)
Follow-up with your cardiologist in the office.  Please return for worsening symptoms.

## 2023-08-23 NOTE — ED Triage Notes (Signed)
Pt c.o nausea, dizziness and fatigue for the past 3 days. Pt sent here from [facility] for further eval.

## 2023-08-23 NOTE — ED Triage Notes (Signed)
Pt presents with increased fatigue, increased dizziness, "the sweats", and nausea x 3 days. Pt reports he has been in touch with his cardiologist and the next step is to replace his aortic valve. Pt reports he called the cardiologist office this morning and the cardiologist wanted him to be evaluated given his symptoms that have worsened over the last three days. Pt denies pain at this time. Pt reports taking his daily medications however no medications for symptoms.

## 2023-08-23 NOTE — ED Provider Notes (Signed)
[facility]    CSN: [id number] Arrival date & time: 08/23/23  7169      History   Chief Complaint Chief Complaint  Patient presents with   Fatigue   Dizziness   Nausea    HPI Ruben [name] is a 77 y.o. male.   Patient presents with fatigue, dizziness, sweats, nausea without vomiting.  Reports the symptoms started about 3 weeks ago but have increased in intensity over the past 3 days.  He denies chest pain, shortness of breath, headache, blurred vision.  Reports that he called his cardiologist this morning who advised him to be evaluated in person.  Patient has a significant heart history including CABG, CAD, atrial flutter.  He had an ablation in 2018.  He does take Plavix.  Denies any recent falls or head injuries.  He also has a history of diabetes and has been taking medications as prescribed.  Reports that his cardiologist recently changed one of his "omega" medications about 40 days ago but has not had any other changes in medications.   Dizziness   Past Medical History:  Diagnosis Date   Atrial flutter (HCC) 02/02/2017   CAD, multiple vessel    Diabetes mellitus (HCC)    Dyslipidemia    Hypertension     Patient Active Problem List   Diagnosis Date Noted   Unstable angina (HCC) 09/27/2018   Atrial flutter with rapid ventricular response (HCC) 02/02/2017   CAD (coronary artery disease) 07/13/2013   Mixed hyperlipidemia 07/13/2013   DM2 (diabetes mellitus, type 2) (HCC) 07/13/2013   Peripheral neuropathy 07/13/2013    Past Surgical History:  Procedure Laterality Date   A-FLUTTER ABLATION N/A 02/05/2017   Procedure: A-Flutter Ablation;  Surgeon: Hillis Range, MD;  Location: [facility];  Service: Cardiovascular;  Laterality: N/A;   CARDIAC CATHETERIZATION  12/13/2011   No intervention - recommend medical therapy with increased medical trial   CARDIOVASCULAR STRESS TEST  10/26/2010   No scintigraphic evidence of inducible myocardial ischemia. No  ECG changes. EKG negative for ischemia.   CORONARY ARTERY BYPASS GRAFT  1997   LIMA-LAD, vein graft to diagonal, vein graft to obtuse marginal, and vein graft to RCA   LEFT HEART CATH AND CORS/GRAFTS ANGIOGRAPHY N/A 09/29/2018   Procedure: LEFT HEART CATH AND CORS/GRAFTS ANGIOGRAPHY;  Surgeon: Marykay Lex, MD;  Location: [facility] INVASIVE CV LAB;  Service: Cardiovascular;  Laterality: N/A;   LEFT HEART CATHETERIZATION WITH CORONARY/GRAFT ANGIOGRAM N/A 12/13/2011   Procedure: LEFT HEART CATHETERIZATION WITH Isabel Caprice;  Surgeon: Lennette Bihari, MD;  Location: [facility] CATH LAB;  Service: Cardiovascular;  Laterality: N/A;   LOWER EXTREMITY ARTERIAL DOPPLER  01/30/2000   Normal arterial study   TEE WITHOUT CARDIOVERSION N/A 02/04/2017   Procedure: TRANSESOPHAGEAL ECHOCARDIOGRAM (TEE);  Surgeon: Lars Masson, MD;  Location: [facility] ENDOSCOPY;  Service: Cardiovascular;  Laterality: N/A;   TRANSTHORACIC ECHOCARDIOGRAM  12/12/2011   EF >55%, LA severely dilated.   ULTRASOUND GUIDANCE FOR VASCULAR ACCESS  09/29/2018   Procedure: Ultrasound Guidance For Vascular Access;  Surgeon: Marykay Lex, MD;  Location: [facility] INVASIVE CV LAB;  Service: Cardiovascular;;       Home Medications    Prior to Admission medications   Medication Sig Start Date End Date Taking? Authorizing Provider  aspirin EC 81 MG tablet Take 1 tablet (81 mg total) by mouth daily. 03/07/17   Hillis Range, MD  Black Pepper-Turmeric (TURMERIC CURCUMIN) 01-999 MG CAPS     [provider]  carvedilol (COREG) 6.25 MG tablet Take 1 tablet (6.25 mg total) by mouth 2 (two) times daily with a meal. 01/14/23   Lennette Bihari, MD  Cholecalciferol (VITAMIN D3) 5000 units CAPS Take 5,000 Units by mouth See admin instructions. Take 5,000 units by mouth once a day and rotate with 10,000 units every other day    [provider]  clopidogrel (PLAVIX) 75 MG tablet Take 1 tablet (75 mg total) by mouth daily. 07/18/23   Lennette Bihari, MD  Coenzyme Q10 (CO Q-10 PO) Take 2 capsules by mouth daily.    [provider]  FERROUS GLUCONATE-C-FOLIC ACID PO Take 1 capsule by mouth daily in the afternoon.    [provider]  icosapent Ethyl (VASCEPA) 1 g capsule Take 2 capsules (2 g total) by mouth 2 (two) times daily. 07/22/23   Lennette Bihari, MD  isosorbide mononitrate (IMDUR) 60 MG 24 hr tablet TAKE 1 TABLET(60 MG) BY MOUTH DAILY 07/18/23   Lennette Bihari, MD  JARDIANCE 10 MG TABS tablet Take 10 mg by mouth daily.    [provider]  losartan (COZAAR) 25 MG tablet Take 1 tablet (25 mg total) by mouth daily. 07/18/23   Lennette Bihari, MD  magnesium aspartate (MAGINEX) 615 MG tablet Take 120 mg by mouth 2 (two) times daily.    [provider]  nitroGLYCERIN (NITROLINGUAL) 0.4 MG/SPRAY spray Place 1 spray under the tongue every 5 (five) minutes x 3 doses as needed for chest pain. 07/18/23   Lennette Bihari, MD  NUCYNTA ER 200 MG TB12 Take 200 mg by mouth 2 (two) times daily. 12/05/17   [provider]  pioglitazone (ACTOS) 45 MG tablet Take 45 mg by mouth daily.    [provider]  pregabalin (LYRICA) 200 MG capsule Take 200 mg by mouth 3 (three) times daily.     [provider]  Quercetin 250 MG TABS Take 500 mg by mouth QID.    [provider]  ranolazine (RANEXA) 1000 MG SR tablet Take 1 tablet (1,000 mg total) by mouth 2 (two) times daily. 07/18/23   Lennette Bihari, MD  rosuvastatin (CRESTOR) 40 MG tablet Take 1 tablet (40 mg total) by mouth daily. 07/18/23   Lennette Bihari, MD  spironolactone (ALDACTONE) 25 MG tablet TAKE 1/2 TABLET(12.5 MG) BY MOUTH DAILY 07/18/23   Lennette Bihari, MD  Turmeric 500 MG TABS Take 1,000 mg by mouth daily.    [provider]  VITAMIN A PO Take by mouth.    [provider]  vitamin E 180 MG (400 UNITS) capsule Take 400 Units by mouth daily.    [provider]    Family History Family History   Problem Relation Age of Onset   Heart disease Father    Diabetes Father    Hyperlipidemia Sister     Social History Social History   Tobacco Use   Smoking status: Never   Smokeless tobacco: Never  Vaping Use   Vaping status: Never Used  Substance Use Topics   Alcohol use: Yes    Comment: occas   Drug use: Yes     Allergies   Nortriptyline and Cymbalta [duloxetine hcl]   Review of Systems Review of Systems Per HPI  Physical Exam Triage Vital Signs ED Triage Vitals  Encounter Vitals Group     BP 08/23/23 0911 (!) 143/74     Systolic BP Percentile --      Diastolic  BP Percentile --      Pulse Rate 08/23/23 0911 (!) 57     Resp 08/23/23 0911 18     Temp 08/23/23 0911 (!) 97.2 F (36.2 C)     Temp Source 08/23/23 0911 Oral     SpO2 08/23/23 0911 96 %     Weight 08/23/23 0910 204 lb (92.5 kg)     Height --      Head Circumference --      Peak Flow --      Pain Score 08/23/23 0909 0     Pain Loc --      Pain Education --      Exclude from Growth Chart --    No data found.  Updated Vital Signs BP (!) 143/74 (BP Location: Left Arm)   Pulse (!) 57   Temp (!) 97.2 F (36.2 C) (Oral)   Resp 18   Wt 204 lb (92.5 kg)   SpO2 96%   BMI 31.02 kg/m   Visual Acuity Right Eye Distance:   Left Eye Distance:   Bilateral Distance:    Right Eye Near:   Left Eye Near:    Bilateral Near:     Physical Exam Constitutional:      General: He is not in acute distress.    Appearance: Normal appearance. He is not toxic-appearing or diaphoretic.  HENT:     Head: Normocephalic and atraumatic.  Eyes:     Extraocular Movements: Extraocular movements intact.     Conjunctiva/sclera: Conjunctivae normal.     Pupils: Pupils are equal, round, and reactive to light.  Cardiovascular:     Rate and Rhythm: Normal rate and regular rhythm.     Pulses: Normal pulses.     Heart sounds: Normal heart sounds.  Pulmonary:     Effort: Pulmonary effort is normal. No respiratory  distress.     Breath sounds: Normal breath sounds.  Neurological:     General: No focal deficit present.     Mental Status: He is alert and oriented to person, place, and time. Mental status is at baseline.     Cranial Nerves: Cranial nerves 2-12 are intact.     Sensory: Sensation is intact.     Motor: Motor function is intact.     Coordination: Coordination is intact.     Gait: Gait is intact.  Psychiatric:        Mood and Affect: Mood normal.        Behavior: Behavior normal.        Thought Content: Thought content normal.        Judgment: Judgment normal.      UC Treatments / Results  Labs (all labs ordered are listed, but only abnormal results are displayed) Labs Reviewed  POCT FASTING CBG [facility] MANUAL ENTRY - Abnormal; Notable for the following components:      Result Value   POCT Glucose ([facility]) 119 (*)    All other components within normal limits  POCT URINALYSIS DIP (MANUAL ENTRY) - Abnormal; Notable for the following components:   Glucose, UA >=1,000 (*)    All other components within normal limits    EKG   Radiology No results found.  Procedures Procedures (including critical care time)  Medications Ordered in UC Medications - No data to display  Initial Impression / Assessment and Plan / UC Course  I have reviewed the triage vital signs and the nursing notes.  Pertinent labs & imaging results that were available during  my care of the patient were reviewed by me and considered in my medical decision making (see chart for details).     EKG completed today that was unremarkable when compared to previous EKGs.  CBG unremarkable.  UA does show glucose but suspect this is due to patient's daily diabetes medications as CBG is normal today.  Discussed with patient I am concerned for cardiac etiology given patient's significant heart history and symptoms seeming to worsen over the past few days.  I do think patient needs a more extensive evaluation than can be  provided here in urgent care so advised patient to go to the ER for further evaluation and he was agreeable with this plan.  Given vital signs and neuroexam are stable agree with patient's family member transporting him to the ER today. Final Clinical Impressions(s) / UC Diagnoses   Final diagnoses:  None   Discharge Instructions   None    ED Prescriptions   None    PDMP not reviewed this encounter.   Gustavus Bryant, [place] 08/23/23 1053

## 2023-08-23 NOTE — Telephone Encounter (Signed)
   Patient with history of CABG, CAD, atrial flutter, aortic valve sclerosis called after hours answering service to discuss new symptoms today. He reports increased fatigue for the last month along with nausea, shortness of breath when laying flat, and non-specific chest tightness. This morning he also notes some sweating. He expresses concern that perhaps his symptoms are a result of his aortic valve sclerosis. He denies any fevers, chills, body aches, lower extremity edema. Given that on August echo, his aortic valve showed no evidence of stenosis, would not expect any significant symptoms from valve dysfunction at this point. Patient doesn't sound in acute distress, is speaking in full sentences on the phone. Without ability to perform physical exam, difficult to guide patient on cause of his symptoms and I encouraged him to seek medical evaluation at urgent care or the emergency department. Patient agreed with this plan.   Perlie Gold, PA-C

## 2023-08-23 NOTE — ED Notes (Signed)
Patient is being discharged from the Urgent Care and sent to the Emergency Department via POV . Per Ervin Knack, FNP, patient is in need of higher level of care due to cardiac workup. Patient is aware and verbalizes understanding of plan of care.  Vitals:   08/23/23 0911  BP: (!) 143/74  Pulse: (!) 57  Resp: 18  Temp: (!) 97.2 F (36.2 C)  SpO2: 96%

## 2023-09-11 DIAGNOSIS — E1142 Type 2 diabetes mellitus with diabetic polyneuropathy: Secondary | ICD-10-CM | POA: Diagnosis not present

## 2023-09-11 DIAGNOSIS — M19071 Primary osteoarthritis, right ankle and foot: Secondary | ICD-10-CM | POA: Diagnosis not present

## 2023-09-11 DIAGNOSIS — M19072 Primary osteoarthritis, left ankle and foot: Secondary | ICD-10-CM | POA: Diagnosis not present

## 2023-09-11 DIAGNOSIS — G629 Polyneuropathy, unspecified: Secondary | ICD-10-CM | POA: Diagnosis not present

## 2023-09-11 DIAGNOSIS — R2689 Other abnormalities of gait and mobility: Secondary | ICD-10-CM | POA: Diagnosis not present

## 2023-10-04 DIAGNOSIS — G894 Chronic pain syndrome: Secondary | ICD-10-CM | POA: Diagnosis not present

## 2023-10-04 DIAGNOSIS — G609 Hereditary and idiopathic neuropathy, unspecified: Secondary | ICD-10-CM | POA: Diagnosis not present

## 2023-10-31 ENCOUNTER — Encounter: Payer: Self-pay | Admitting: Cardiovascular Disease

## 2023-10-31 ENCOUNTER — Ambulatory Visit: Payer: Medicare Other | Attending: Cardiovascular Disease | Admitting: Cardiovascular Disease

## 2023-10-31 VITALS — BP 115/60 | HR 50 | Ht 69.0 in | Wt 206.4 lb

## 2023-10-31 DIAGNOSIS — I358 Other nonrheumatic aortic valve disorders: Secondary | ICD-10-CM | POA: Diagnosis not present

## 2023-10-31 DIAGNOSIS — I10 Essential (primary) hypertension: Secondary | ICD-10-CM | POA: Diagnosis not present

## 2023-10-31 DIAGNOSIS — Z951 Presence of aortocoronary bypass graft: Secondary | ICD-10-CM | POA: Diagnosis not present

## 2023-10-31 DIAGNOSIS — E782 Mixed hyperlipidemia: Secondary | ICD-10-CM | POA: Insufficient documentation

## 2023-10-31 DIAGNOSIS — I2510 Atherosclerotic heart disease of native coronary artery without angina pectoris: Secondary | ICD-10-CM | POA: Insufficient documentation

## 2023-10-31 DIAGNOSIS — R5383 Other fatigue: Secondary | ICD-10-CM | POA: Diagnosis not present

## 2023-10-31 DIAGNOSIS — E1140 Type 2 diabetes mellitus with diabetic neuropathy, unspecified: Secondary | ICD-10-CM | POA: Insufficient documentation

## 2023-10-31 DIAGNOSIS — G629 Polyneuropathy, unspecified: Secondary | ICD-10-CM | POA: Insufficient documentation

## 2023-10-31 DIAGNOSIS — Z8679 Personal history of other diseases of the circulatory system: Secondary | ICD-10-CM | POA: Diagnosis not present

## 2023-10-31 MED ORDER — CARVEDILOL 3.125 MG PO TABS: 3.1250 mg | ORAL_TABLET | Freq: Two times a day (BID) | ORAL | 3 refills | Status: DC

## 2023-10-31 NOTE — Patient Instructions (Addendum)
 Medication Instructions:  Decrease the Carvedilol  to 3.125mg . Take 2 tablets daily. *If you need a refill on your cardiac medications before your next appointment, please call your pharmacy*   Lab Work: No labs were ordered during today's visit.  If you have labs (blood work) drawn today and your tests are completely normal, you will receive your results only by: MyChart Message (if you have MyChart) OR A paper copy in the mail If you have any lab test that is abnormal or we need to change your treatment, we will call you to review the results.   Testing/Procedures: No procedures ordered today.    Follow-Up: At [facility], you and your health needs are our priority.  As part of our continuing mission to provide you with exceptional heart care, we have created designated Provider Care Teams.  These Care Teams include your primary Cardiologist (physician) and Advanced Practice Providers (APPs -  Physician Assistants and Nurse Practitioners) who all work together to provide you with the care you need, when you need it.  We recommend signing up for the patient portal called MyChart.  Sign up information is provided on this After Visit Summary.  MyChart is used to connect with patients for Virtual Visits (Telemedicine).  Patients are able to view lab/test results, encounter notes, upcoming appointments, etc.  Non-urgent messages can be sent to your provider as well.   To learn more about what you can do with MyChart, go to [email or web address].    Your next appointment:   8 month(s)  Provider:   Dr. Ozell Fell     Other Instructions Thank you for choosing [facility]!

## 2023-11-02 ENCOUNTER — Encounter: Payer: Self-pay | Admitting: Cardiovascular Disease

## 2023-11-02 NOTE — Progress Notes (Signed)
 Patient ID: Ruben [name], male   DOB: 05/27/1946, 78 y.o.   MRN: [id number]       PCP: Dr. Janey  HPI: Ruben [name] is a 78 y.o. male who presents for a 4 month cardiology followup evaluation.  Ruben [name] has established CAD and in 1997 underwent CABG surgery with a LIMA to the LAD, vein to the diagonal, vein to the obtuse marginal, and vein to the right coronary artery. In November 2004 he underwent stenting to the vein graft to the circumflex vessel. In December 2009 the graft to the circumflex was occluded and he underwent stenting of the left main into the superior ramus intermediate vessel. At that time he also underwent stenting of the vein graft to the right coronary artery. His last catheterization which was done for repeat chest pain in March 2013 showed patent stents. He has known occlusion of the graft that supplied the circumflex marginal vessel. The graft to diagonal vessel was patent and filled the proximal LAD system and the LIMA graft which filled the mid LAD system was widely patent.  He has ahistory of mixed hyperlipidemia and has been on aggressive lipid-lowering therapy over the past 18 years. He also has developed a peripheral neuropathy. He has mild diabetes mellitus and is now followed by Dr. Kayren. In April 2014 which labs showed a total cholesterol was 125, triglycerides 52, HDL 55, LDL 52 on aggressive combination therapy consisting of Niaspan  2 g daily, Crestor  40 mg daily, Lovaza  2 g daily.  Initially, we had followed him with [facility] heart lab assessments.  On 12/08/2013 a followup nuclear perfusion study continued to show normal perfusion without scar or ischemia on his current medical therapy.  Ejection fraction was 64%.  He is retired for several years and with more time he exercises regularly and walks up to 3 -5 miles per day approximately 4-5 days per week and does yoga 3 days per week.  He denies any recurrent anginal symptoms.  He denies any shortness of  breath.  He denies PND, orthopnea.  He denies palpitations.   Laboratory in May 2015 showed significant aggressive lipid management with an LDL particle number now at 371 and calculated LDL 43, HDL 57, triglycerides 37, total cholesterol 107.  Small LDL particles of 156.  He still had an increased VLDL size.  Insulin  resistance score was less than 25.  At that time, I recommended he reduce his Niaspan  from 2000 mg to 1000 mg a discussed the possibility of discontinuing this altogether in the future depending upon subsequent levels.  In March 2017 he underwent a repeat echo Doppler study which showed mild LVH with normal systolic function without wall motion abnormalities.  There was very mild aortic regurgitation.  His aortic root dimension measured 39 mm.  His left atrium was mildly dilated.  Pulmonary pressures were normal.  He also underwent a repeat lipoprofile which showed an LDL particle #856.  He had 136 small LDL particles was normal.  Total cholesterol was 127, HDL 55, triglycerides 57, and LDL 61.  Apo lipoprotein B was 54 and LPa was 66.  In May 2018  developed a new onset atrial flutter.  He was hospitalized; TEE showed mild dilation of his atrium without thrombus.  He underwent atrial flutter ablation by Dr. Kelsie and was found to have isthmus dependent right atrial flutter.  He was started on eliquis  doing his presentation.  He saw Dr. Kelsie in follow-up on 03/07/2017 and eliquis  was discontinued at  that time and he was told to resume aspirin .  He was questioning whether or not to take Plavix  again.  He also had an ACE-induced cough and lisinopril was discontinued and he was started on low-dose losartan .    When I saw him in March 2019 , he was doing well and was without recurrent anginal symptoms or any recurrent atrial arrhythmias.  He continues have problems with his neuropathy  Because of the neuropathy he was walking approximately 2-3 miles per day instead of 3-5 miles as he had in the  past.  He continued to use Lyrica  and Nucynta .  He was now back on Plavix  and aspirin  particular with his left main stent.  He is followed by [name] who checks his laboratory.  Most recent labs from 11/28/17 : TC 125, triglycerides 40, HDL 60, and LDL 50.  He has continued to be on Crestor , lovaza , and niacin  with remote very low HDL levels and slightly icreased LP(a).  Prior to seeing him at his last office visit in June 2019 he  began to notice some left arm discomfort which was not clearly exertionally precipitated.  He describes it as feeling like he had just gotten a tetanus shot.  He denied any associated chest pressure.  However, when he walked up steps he noticed some mild increased shortness of breath.  He ultimately presented to urgent care where ECG was done.  With his CAD he was advised to go to the emergency room.  In the emergency room laboratory was drawn.  Troponins were negative.  He waited 9 hours but was never seen and ultimately left.  He called the office and was added onto my schedule.  In light of his recent chest pain with some atypical features I scheduled him for a nuclear perfusion study which was done on March 13, 2018.  Ejection fraction was 55%.  He did not develop any chest pain or ECG changes.  He had normal perfusion without evidence for scar or ischemia.  He underwent recent laboratory which has shown a cholesterol 125, triglycerides 53, HDL 55, LDL 59 on his current medical regimen.  LP(a) was 62.9.   I saw him in September 2019 that time he had issues with both of his ARB therapy and in its place I started him on olmesartan  20 mg daily.  Since I saw him, he was seen on several occasions in the office Ruben [name] and underwent EP cardiac catheterization on September 29, 2020 he had presented with fatigue Gres of chest pain commencing on September 26, 2018.  Currently he was only taking 1/2 tablet of Plavix  75 mg prior to presentation due to easy bruisability.  He underwent  repeat catheterization by Dr. Anner which revealed overall stable native coronary and graft disease out significant change from his 2013 cardiac catheterization.  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  The previously placed ostial left main to distal left main stent was widely patent old occlusion of his circumflex marginal and vein graft was marginal vessel.  Since hospital discharge he denies any recurrent episodes of chest pain.  However, at times he still experiences episodic mild shortness of breath.  Nuys PND orthopnea.  He is unaware of any recurrent atrial fib or flutter.  Past he had significant mixed hyperlipidemia and has continued to stay on simvastatin 40 mg, 2 capsules twice a day niacin .  In the past we discussed discontinuance of niacin  and he preferred to stay on this and has  done well.    When I saw him in office follow-up in February 2020 he was mildly hypertensive and with his echo documentation of grade 2 diastolic dysfunction I recommended the addition of low-dose spironolactone  at 12.5 mg daily.  He has tolerated this well.   I evaluated him in a telemedicine visit on Feb 12, 2019.  At that time his blood pressure was stable typically was running in the 110 -120 range systolically. He saw Dr. Mirna on December 04, 2018.  Repeat laboratory showed a total cholesterol 123, triglycerides 70, HDL 52, LDL 60.  He denies any recurrent episodes of chest tightness.  He exercises daily and basically exercises for at least 180 minutes/week typically on the treadmill.  He usually walks at  2.8 mph and 4% grade.  He denies any shortness of breath with the exception of one occasion when he was walking up a steep hill but this ultimately subsided.  He continues to be on Onglyza and p.o. glitazone for his diabetes mellitus followed closely by Dr. all-time her.  When I reviewed Dr. Alzheimer's laboratory his creatinine was 1.50, K 5.3.  He continued to be on Lyrica  and Nucynta   for his peripheral  neuropathy.    I saw him in March 2021.  Since his May 2020 evaluation he has continued to do well.  He denies any recurrent anginal symptomatology.  He sees Dr. Mirna regularly.  Most recent laboratory December 01, 2019 showed a total cholesterol 112, HDL 49, LDL 50 and triglycerides 73.  He continues to be on rosuvastatin  40 mg, Lovaza  2 capsules daily in addition to niacin  1000 mg.  His blood pressure has been fairly well controlled on his medical regimen consisting of carvedilol , isosorbide  and spironolactone .  He continues to be on ranolazine .  He is diabetic on  Onglyza and Actos .   I saw him on Feb 16, 2021. Since his prior evaluation he  continued to do well.  He has been keeping busy doing projects at home.  He typically walks 2 times per week for 50 minutes.  Recently, when he does these extended projects, he seems to have to rest in 3 to 4 hours.  Niacin  was stopped 4 months ago.  He has not had any classic anginal symptoms.  He required removal of cerumen by Dr. Medford Angle due to significant wax buildup but this did not affect his hearing.  He had undergone recent laboratory in March 2022 which showed a total cholesterol 114, HDL 40, LDL 58, triglycerides 73.  Creatinine was 0.87.  Potassium 4.3.  He had normal LFTs.  Since it was 25 years since his CABG revascularization surgery, with some increased fatigue I recommended he undergo a Lexiscan  Myoview  study.  On April 20, 2021, he underwent a Lexiscan  Myoview  study.  This was low risk with EF 63% with normal perfusion without evidence for scar or ischemia.  I saw him on November 15, 2022 at which time he remained stable and denied chest pain or significant change in shortness of breath or palpitations.    He was not exercising as much as he had in the past.  He now sees Dr. Sebastian at [facility] for endocrinology since Dr. Mirna retired.  He had seen Dr. Venetia Potters of [facility] for his neuropathy.  He continues to be on  carvedilol  6.25 mg twice a day, isosorbide  60 mg daily, spironolactone  12.5 mg, ranolazine  1000 mg twice a day and losartan  25 mg daily for hypertension and  his CAD.  He is on rosuvastatin  40 mg and Lovaza  for mixed hyperlipidemia.  He has been on chronic DAPT with aspirin /Plavix .  He is diabetic on Jardiance , Actos  therapy.  That evaluation his blood pressure was stable.  I recommended he undergo a follow-up echo Doppler study months for further evaluation.  I last saw him on July 18, 2023 at which time he felt well.  He underwent a 2D echo Doppler study on May 23, 2023 which showed normal systolic function with EF 60 to 65% without wall motion abnormality.  He had normal global strain.  Left atrium was mildly dilated.  There was mild aortic sclerosis.  Ascending aorta was minimally dilated at 39 mm.  He has been seeing Ruben [name].  He continues to be on DAPT with aspirin /Plavix  without side effects.  He is on carvedilol  6.25 mg twice a day, isosorbide  mononitrate 60 mg daily, losartan  25 mg and ranolazine  1000 mg twice a day.  He is without angina.  He is on rosuvastatin  40 mg and Lovaza  2 capsules daily. He is diabetic on Jardiance  in addition to pioglitazone .  During that evaluation, I discussed my plans for retirement and with his complex CAD history with subsequent interventions as well as his aortic valve sclerosis I suggested upon my retirement he transition to Dr. Michael Cooper for cardiology follow-up in 1 year.  Since I last saw him, he presented to the emergency room on August 23, 2023 complaining of increasing fatigability.  He often required a nap.  He also admitted to sweating more especially from his axilla bilaterally.  He denied any chest pain.  At his ER evaluation, his blood pressure was 129/62, pulse was 53.  Presently, he continues to admit to some fatigability.  His heart rate typically runs low but he denies any presyncope or syncope.  He is followed by Dr.  Nicholaus Joane [name].  He admits to slightly increased weight.  He has continued to be on aspirin  and Plavix  for DAPT.  He is on carvedilol  6.125 mg twice a day, isosorbide  60 mg daily, losartan  25 mg, ranolazine  1000 g twice a day in addition to spironolactone  12.5 mg.  He is on rosuvastatin  40 mg and Vascepa  2 capsules twice a day for mixed hyperlipidemia.  He is on Jardiance  10 mg daily in addition to Actos  for his diabetes.  He takes Lyrica  for his peripheral neuropathy.  He presents for follow-up evaluation.   Past Surgical History:  Procedure Laterality Date   A-FLUTTER ABLATION N/A 02/05/2017   Procedure: A-Flutter Ablation;  Surgeon: Kelsie Agent, MD;  Location: [facility] INVASIVE CV LAB;  Service: Cardiovascular;  Laterality: N/A;   CARDIAC CATHETERIZATION  12/13/2011   No intervention - recommend medical therapy with increased medical trial   CARDIOVASCULAR STRESS TEST  10/26/2010   No scintigraphic evidence of inducible myocardial ischemia. No ECG changes. EKG negative for ischemia.   CORONARY ARTERY BYPASS GRAFT  1997   LIMA-LAD, vein graft to diagonal, vein graft to obtuse marginal, and vein graft to RCA   LEFT HEART CATH AND CORS/GRAFTS ANGIOGRAPHY N/A 09/29/2018   Procedure: LEFT HEART CATH AND CORS/GRAFTS ANGIOGRAPHY;  Surgeon: Anner Alm ORN, MD;  Location: [facility] INVASIVE CV LAB;  Service: Cardiovascular;  Laterality: N/A;   LEFT HEART CATHETERIZATION WITH CORONARY/GRAFT ANGIOGRAM N/A 12/13/2011   Procedure: LEFT HEART CATHETERIZATION WITH EL BILE;  Surgeon: Debby DELENA Sor, MD;  Location: [facility] CATH LAB;  Service: Cardiovascular;  Laterality: N/A;   LOWER EXTREMITY ARTERIAL  DOPPLER  01/30/2000   Normal arterial study   TEE WITHOUT CARDIOVERSION N/A 02/04/2017   Procedure: TRANSESOPHAGEAL ECHOCARDIOGRAM (TEE);  Surgeon: Maranda Leim DEL, MD;  Location: [facility] ENDOSCOPY;  Service: Cardiovascular;  Laterality: N/A;   TRANSTHORACIC ECHOCARDIOGRAM  12/12/2011   EF >55%, LA severely dilated.    ULTRASOUND GUIDANCE FOR VASCULAR ACCESS  09/29/2018   Procedure: Ultrasound Guidance For Vascular Access;  Surgeon: Anner Alm ORN, MD;  Location: [facility] INVASIVE CV LAB;  Service: Cardiovascular;;    Allergies  Allergen Reactions   Nortriptyline Other (See Comments)    Groggy at low dose   Cymbalta [Duloxetine Hcl] Diarrhea    Current Outpatient Medications  Medication Sig Dispense Refill   aspirin  EC 81 MG tablet Take 1 tablet (81 mg total) by mouth daily. 90 tablet 3   Black Pepper-Turmeric (TURMERIC CURCUMIN) 01-999 MG CAPS      carvedilol  (COREG ) 3.125 MG tablet Take 1 tablet (3.125 mg total) by mouth 2 (two) times daily. 180 tablet 3   Cholecalciferol (VITAMIN D3) 5000 units CAPS Take 5,000 Units by mouth See admin instructions. Take 5,000 units by mouth once a day and rotate with 10,000 units every other day     clopidogrel  (PLAVIX ) 75 MG tablet Take 1 tablet (75 mg total) by mouth daily. 90 tablet 3   Coenzyme Q10 (CO Q-10 PO) Take 2 capsules by mouth daily.     FERROUS GLUCONATE-C-FOLIC ACID  PO Take 1 capsule by mouth daily in the afternoon.     icosapent  Ethyl (VASCEPA ) 1 g capsule Take 2 capsules (2 g total) by mouth 2 (two) times daily. 360 capsule 3   isosorbide  mononitrate (IMDUR ) 60 MG 24 hr tablet TAKE 1 TABLET(60 MG) BY MOUTH DAILY 90 tablet 3   JARDIANCE  10 MG TABS tablet Take 10 mg by mouth daily.     losartan  (COZAAR ) 25 MG tablet Take 1 tablet (25 mg total) by mouth daily. 90 tablet 3   magnesium aspartate (MAGINEX) 615 MG tablet Take 120 mg by mouth 2 (two) times daily.     nitroGLYCERIN  (NITROLINGUAL ) 0.4 MG/SPRAY spray Place 1 spray under the tongue every 5 (five) minutes x 3 doses as needed for chest pain. 12 g 11   NUCYNTA  ER 200 MG TB12 Take 200 mg by mouth 2 (two) times daily.  0   pioglitazone  (ACTOS ) 45 MG tablet Take 45 mg by mouth daily.     pregabalin  (LYRICA ) 200 MG capsule Take 200 mg by mouth 3 (three) times daily.      Quercetin 250 MG TABS Take 500 mg  by mouth QID.     ranolazine  (RANEXA ) 1000 MG SR tablet Take 1 tablet (1,000 mg total) by mouth 2 (two) times daily. 180 tablet 1   rosuvastatin  (CRESTOR ) 40 MG tablet Take 1 tablet (40 mg total) by mouth daily. 90 tablet 3   spironolactone  (ALDACTONE ) 25 MG tablet TAKE 1/2 TABLET(12.5 MG) BY MOUTH DAILY 45 tablet 3   Turmeric 500 MG TABS Take 1,000 mg by mouth daily.     VITAMIN A PO Take by mouth.     vitamin E 180 MG (400 UNITS) capsule Take 400 Units by mouth daily.     No current facility-administered medications for this visit.    Social History   Socioeconomic History   Marital status: Married    Spouse name: Not on file   Number of children: Not on file   Years of education: Not on file   Highest education  level: Not on file  Occupational History   Not on file  Tobacco Use   Smoking status: Never   Smokeless tobacco: Never  Vaping Use   Vaping status: Never Used  Substance and Sexual Activity   Alcohol use: Yes    Comment: occas   Drug use: Yes   Sexual activity: Yes    Partners: Female    Comment: MARRIED  Other Topics Concern   Not on file  Social History Narrative   Are you right handed or left handed? right   Are you currently employed ? retired   Caffeine 1 soda a day   Do you live at home alone?wife      What type of home do you live in: 1 story or 2 story? two       Social Drivers of Corporate Investment Banker Strain: Low Risk  (02/11/2023)   Received from [facility]   Overall Financial Resource Strain (CARDIA)    Difficulty of Paying Living Expenses: Not very hard  Food Insecurity: No Food Insecurity (02/11/2023)   [facility] from [facility]   Hunger Vital Sign    Worried About Running Out of Food in the Last Year: Never true    Ran Out of Food in the Last Year: Never true  Transportation Needs: No Transportation Needs (02/11/2023)   Received from [facility] - Transportation    Lack of Transportation (Medical): No    Lack of  Transportation (Non-Medical): No  Physical Activity: Insufficiently Active (02/11/2023)   Received from [facility]   Exercise Vital Sign    Days of Exercise per Week: 2 days    Minutes of Exercise per Session: 30 min  Stress: No Stress Concern Present (02/11/2023)   Received from [facility] - Occupational Stress Questionnaire    Feeling of Stress : Not at all  Social Connections: Somewhat Isolated (02/11/2023)   Received from [facility]   Social Network    How would you rate your social network (family, work, friends)?: Restricted participation with some degree of social isolation  Intimate Partner Violence: Not At Risk (02/11/2023)   Received from [facility]   HITS    Over the last 12 months how often did your partner physically hurt you?: Never    Over the last 12 months how often did your partner insult you or talk down to you?: Never    Over the last 12 months how often did your partner threaten you with physical harm?: Never    Over the last 12 months how often did your partner scream or curse at you?: Never    Family History  Problem Relation Age of Onset   Heart disease Father    Diabetes Father    Hyperlipidemia Sister    Socially he is married. His former wife  was Kyleen Beretta, an iinsurance agent. He has been married for 22 years with his present wife and has 2 stepchildren with her. He does exercise least 4-5 days per week. He has now retired. Previously he was an airline pilot for [employer].   ROS General: Negative; No fevers, chills, or night sweats; positive fatigue HEENT: Recent removal of significant buildup of cerumen in his ears; No changes in vision or hearing, sinus congestion, difficulty swallowing Pulmonary: Negative; No cough, wheezing, shortness of breath, hemoptysis Cardiovascular: See HPI GI: Negative; No nausea, vomiting, diarrhea, or abdominal pain GU: Negative; No dysuria, hematuria, or  difficulty voiding Musculoskeletal: Negative; no myalgias, joint pain, or weakness Hematologic/Oncology: Negative; no easy bruising, bleeding Endocrine: Positive for diabetes mellitus, type II; no heat/cold intolerance;  Neuro: Positive for peripheral neuropathy; no changes in balance, headaches Skin: Negative; No rashes or skin lesions Psychiatric: Negative; No behavioral problems, depression Sleep: Negative; No snoring, daytime sleepiness, hypersomnolence, bruxism, restless legs, hypnogognic hallucinations, no cataplexy Other comprehensive 14 point system review is negative.  PE BP 115/60   Pulse (!) 50   Ht 5' 9 (1.753 m)   Wt 206 lb 6.4 oz (93.6 kg)   SpO2 96%   BMI 30.48 kg/m    Repeat blood pressure by me 130/70  Wt Readings from Last 3 Encounters:  10/31/23 206 lb 6.4 oz (93.6 kg)  08/23/23 204 lb (92.5 kg)  08/01/23 206 lb (93.4 kg)   General: Alert, oriented, no distress.  Skin: normal turgor, no rashes, warm and dry HEENT: Normocephalic, atraumatic. Pupils equal round and reactive to light; sclera anicteric; extraocular muscles intact; Nose without nasal septal hypertrophy Mouth/Parynx benign; Mallinpatti scale 3 Neck: No JVD, no carotid bruits; normal carotid upstroke Lungs: clear to ausculatation and percussion; no wheezing or rales Chest wall: without tenderness to palpitation Heart: PMI not displaced, RRR, s1 s2 normal, 1-2/6 systolic murmur in the aortic area consistent with his aortic sclerosis, no diastolic murmur, no rubs, gallops, thrills, or heaves Abdomen: soft, nontender; no hepatosplenomehaly, BS+; abdominal aorta nontender and not dilated by palpation. Back: no CVA tenderness Pulses 2+ Musculoskeletal: full range of motion, normal strength, no joint deformities Extremities: no clubbing cyanosis or edema, Homan's sign negative  Neurologic: grossly nonfocal; Cranial nerves grossly wnl Psychologic: Normal mood and affect  EKG  Interpretation Date/Time:  Thursday October 31 2023 12:04:08 EST Ventricular Rate:  50 PR Interval:  296 QRS Duration:  112 QT Interval:  456 QTC Calculation: 415 R Axis:   -40  Text Interpretation: Sinus bradycardia with 1st degree A-V block Left axis deviation Incomplete right bundle branch block When compared with ECG of 23-Aug-2023 11:34, Sinus rhythm has replaced Junctional rhythm Confirmed by Ruben [name] ([id number]) on 11/02/2023 9:59:08 AM     November 15, 2022 ECG (independently read by me): Sinus bradycardia at 58, 1st degree AV block, PR 280 msecc, PVC, nonpsecific T wave anbormality  Feb 16, 2021 ECG (independently read by me): Sinus bradycardia at 50, first-degree AV block.  March 24, 2021ECG (independently read by me): Sinus bradycardia 53 bpm, first-degree AV block with.  Seconds.  Left axis deviation.  No significant ST changes.  QTc interval 429 ms.PR 280 msec  February 2020 ECG (independently read by me): Sinus rhythm at 62 bpm with first-degree AV block..  PR 248 msecms.  No significant ST changes.  September 2019 ECG (independently read by me): Sinus bradycardia at 50; 1st degree AV block; PR 276 msec  June 2019 ECG (independently read by me): From March 04, 2018 at the emergency room/urgent care: Normal sinus rhythm at 60 bpm with first-degree AV block.  PR interval 258 ms.  Left axis deviation.  Poor R wave progression V1 through V3.  No acute abnormalities.  March 2019 ECG (independently read by me): sinus bradycardia 54 bpm.  First degree AV block.  Nonspecific T waves.  PR interval 268 ms  September 2018 ECG (independently read by me): Sinus bradycardia at 55 bpm with first-degree AV block.  PR interval 264 ms.  Nonspecific T changes inferiorly.  November 2017 ECG (independently read by me): Sinus bradycardia at  59 bpm.  First degree AV block with a PR interval at 23 ms.  Incomplete right bundle branch block.  No significant ST segment changes.  September 2015 ECG  (independently read by me): Sinus bradycardia 55 beats per minute.  First degree AV block with PR interval 258 ms.  Mild RV conduction delay.  Nonspecific T changes.  Prior 07/13/2013 ECG: Sinus bradycardia 54 beats per minute with first-degree AV block. Nonspecific T changes.  LABS:    Latest Ref Rng & Units 08/23/2023   11:40 AM 11/20/2018    8:45 AM 09/28/2018   12:46 AM  BMP  Glucose 70 - 99 mg/dL 875  874  77   BUN 8 - 23 mg/dL 16  11  9    Creatinine 0.61 - 1.24 mg/dL 9.13  9.07  9.04   BUN/Creat Ratio 10 - 24  12    Sodium 135 - 145 mmol/L 137  135  137   Potassium 3.5 - 5.1 mmol/L 4.4  5.0  3.8   Chloride 98 - 111 mmol/L 103  100  105   CO2 22 - 32 mmol/L 25  29  26    Calcium  8.9 - 10.3 mg/dL 9.1  9.5  8.8       Latest Ref Rng & Units 06/11/2018    8:19 AM 03/04/2018    4:14 PM 06/27/2017    8:10 AM  Hepatic Function  Total Protein 6.0 - 8.5 g/dL 6.2  6.7  6.5   Albumin 3.5 - 4.8 g/dL 4.4  4.3  4.5   AST 0 - 40 IU/L 24  40  29   ALT 0 - 44 IU/L 20  25  24    Alk Phosphatase 39 - 117 IU/L 91  93  100   Total Bilirubin 0.0 - 1.2 mg/dL 0.9  1.3  1.2       Latest Ref Rng & Units 08/23/2023   11:40 AM 10/15/2018   12:28 PM 09/30/2018    4:55 AM  CBC  WBC 4.0 - 10.5 K/uL 5.6  4.2  4.7   Hemoglobin 13.0 - 17.0 g/dL 85.5  86.4  87.7   Hematocrit 39.0 - 52.0 % 44.2  40.4  35.7   Platelets 150 - 400 K/uL 193  184  117    Lab Results  Component Value Date   MCV 91.1 08/23/2023   MCV 95 10/15/2018   MCV 94.2 09/30/2018   Lab Results  Component Value Date   TSH 1.870 06/11/2018   Lab Results  Component Value Date   HGBA1C 5.5 06/27/2017    Lipid Panel     Component Value Date/Time   CHOL 110 09/28/2018 0046   CHOL 125 06/11/2018 0819   CHOL 127 11/09/2015 0854   CHOL 107 02/12/2014 0801   TRIG 37 09/28/2018 0046   TRIG 57 11/09/2015 0854   TRIG 37 02/12/2014 0801   HDL 51 09/28/2018 0046   HDL 55 06/11/2018 0819   HDL 55 11/09/2015 0854   HDL 57 02/12/2014 0801    CHOLHDL 2.2 09/28/2018 0046   VLDL 7 09/28/2018 0046   LDLCALC 52 09/28/2018 0046   LDLCALC 59 06/11/2018 0819   LDLCALC 61 11/09/2015 0854   LDLCALC 43 02/12/2014 0801    RADIOLOGY: No results found.  IMPRESSION:  1. Primary hypertension   2. Coronary artery disease involving native coronary artery of native heart without angina pectoris   3. Hx of CABG   4. Mixed hyperlipidemia   5.  Aortic valve sclerosis   6. Fatigability   7. Type 2 diabetes mellitus with diabetic neuropathy, without long-term current use of insulin  (HCC)   8. Peripheral polyneuropathy   9. History of isthmus dependent right atrial flutter: Status post ablation by Dr. Kelsie 2018     ASSESSMENT AND PLAN: [name] is a 78 year old white male who underwent CABG revascularization surgery in 1997 at age 58; and is status post initial stenting of the graft to the circumflex vessel in 2004. In December 2009 he underwent stenting of his left main into the superior ramus branch and stenting to the vein graft supplying the RCA. The graft to the circumflex was occluded  At catheterization in March 2013 the stent to the left main/ramus vessel was widely patent as was the stent to the vein graft to the right coronary artery. His proximal LAD was being supplied by his diagonal vessel and the mid LAD was supplied by the LIMA graft.  His nuclear perfusion study  in March 2015 showed normal perfusion without scar or ischemia on his medical therapy.  He developed atrial flutter in 2018 which was isthmus dependent right atrial flutter and was successfully ablated.  He was on eliquis  anticoagulation for one month.  When I subsequently saw him, he had been on aspirin  alone and with his left main stent I also recommended resumption of Plavix .  He has been stable without significant previous anginal symptoms on a medical regimen consisting of carvedilol  6.25 mg twice a day, isosorbide  30 mg, ranolazine  1000 mg twice a day in  addition to his losartan .  Cardiac catheterization findings in January 2020 were not significantly changed from 2013 evaluation. The left main stent is widely patent.   When  seen in May 2022 at that time I recommended he undergo a Lexiscan  Myoview  study in light of some shortness of breath.  This remained stable without scar or ischemia with EF at 63%.  He continues to be on long-term DAPT.  His blood pressure remained stable on carvedilol  6.25 mg twice a day, in addition to his isosorbide  60 mg, losartan  25 mg and spironolactone  12.5 mg.  He is not having any anginal symptoms and continues to be on Ranexa  1000 mg twice a day.  Presently, he was questioning about omega-3 fatty acid and I presented with him the Reduce-it data trial with Vascepa  which may be preferable to his Lovaza .  He wishes to switch and I will initiate Vascepa  2 g twice a day.  He is followed at [facility] for his diabetes and is on Jardiance  in addition to Actos .  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  He also is on Nucynta  ER.  His most recent echo Doppler study  continues to demonstrate normal LV function with EF at 60 to 65%.  On exam he does have an aortic sclerosis murmur.  Since his last evaluation in October 2024, I reviewed his ER evaluation.  At that time clinically he appeared stable.  However due to significant fatigability and his bradycardia with heart rate today at 50, I have suggested reducing carvedilol  from 6.25 mg twice a day down to 3.125 mg twice a day.  With his left main stent and complex CAD I have recommended long-term DAPT with aspirin /Plavix .  He continues to be on rosuvastatin  40 mg and Vascepa  2 capsules twice a day for his mixed hyperlipidemia.  His blood pressure today is stable without orthostatic change on  his blood pressure and anti-ischemic regimen consisting of isosorbide  60 mg, losartan  25 mg daily, spironolactone  12.5 mg.  He is diabetic on Jardiance  in  addition to Actos .  As I discussed at his October 2024 office visit, with his complex CAD history with subsequent interventions and his aortic valve sclerosis, I have suggested that upon my retirement he transition to Dr. Ozell Fell for cardiology follow-up with plans to see Dr. Fell in October 2025.  I will be happy to see him anytime sooner prior to my retirement.   Debby RONAL Sor, MD, FACC  11/02/2023 10:09 AM

## 2023-11-10 ENCOUNTER — Telehealth: Payer: Self-pay | Admitting: Physician Assistant

## 2023-11-10 ENCOUNTER — Other Ambulatory Visit: Payer: Self-pay

## 2023-11-10 ENCOUNTER — Emergency Department (HOSPITAL_COMMUNITY)
Admission: EM | Admit: 2023-11-10 | Discharge: 2023-11-10 | Disposition: A | Discharge status: Home / Self Care | Payer: Medicare Other | Attending: Emergency Medicine | Admitting: Emergency Medicine

## 2023-11-10 ENCOUNTER — Emergency Department (HOSPITAL_COMMUNITY): Payer: Medicare Other

## 2023-11-10 DIAGNOSIS — Z951 Presence of aortocoronary bypass graft: Secondary | ICD-10-CM | POA: Diagnosis not present

## 2023-11-10 DIAGNOSIS — I2510 Atherosclerotic heart disease of native coronary artery without angina pectoris: Secondary | ICD-10-CM | POA: Insufficient documentation

## 2023-11-10 DIAGNOSIS — Z955 Presence of coronary angioplasty implant and graft: Secondary | ICD-10-CM | POA: Diagnosis not present

## 2023-11-10 DIAGNOSIS — Z7982 Long term (current) use of aspirin: Secondary | ICD-10-CM | POA: Insufficient documentation

## 2023-11-10 DIAGNOSIS — R42 Dizziness and giddiness: Secondary | ICD-10-CM | POA: Diagnosis not present

## 2023-11-10 DIAGNOSIS — I4892 Unspecified atrial flutter: Secondary | ICD-10-CM | POA: Insufficient documentation

## 2023-11-10 DIAGNOSIS — R000 Tachycardia, unspecified: Secondary | ICD-10-CM | POA: Diagnosis not present

## 2023-11-10 DIAGNOSIS — R002 Palpitations: Secondary | ICD-10-CM | POA: Diagnosis not present

## 2023-11-10 DIAGNOSIS — Z981 Arthrodesis status: Secondary | ICD-10-CM | POA: Diagnosis not present

## 2023-11-10 DIAGNOSIS — I10 Essential (primary) hypertension: Secondary | ICD-10-CM | POA: Diagnosis not present

## 2023-11-10 DIAGNOSIS — R0989 Other specified symptoms and signs involving the circulatory and respiratory systems: Secondary | ICD-10-CM | POA: Diagnosis not present

## 2023-11-10 LAB — CBC WITH DIFFERENTIAL/PLATELET
Abs Immature Granulocytes: 0.02 10*3/uL (ref 0.00–0.07)
Basophils Absolute: 0 10*3/uL (ref 0.0–0.1)
Basophils Relative: 1 %
Eosinophils Absolute: 0.1 10*3/uL (ref 0.0–0.5)
Eosinophils Relative: 1 %
HCT: 39.9 % (ref 39.0–52.0)
Hemoglobin: 13.6 g/dL (ref 13.0–17.0)
Immature Granulocytes: 0 %
Lymphocytes Relative: 15 %
Lymphs Abs: 0.9 10*3/uL (ref 0.7–4.0)
MCH: 30.6 pg (ref 26.0–34.0)
MCHC: 34.1 g/dL (ref 30.0–36.0)
MCV: 89.7 fL (ref 80.0–100.0)
Monocytes Absolute: 0.6 10*3/uL (ref 0.1–1.0)
Monocytes Relative: 10 %
Neutro Abs: 4.2 10*3/uL (ref 1.7–7.7)
Neutrophils Relative %: 73 %
Platelets: 205 10*3/uL (ref 150–400)
RBC: 4.45 MIL/uL (ref 4.22–5.81)
RDW: 16.2 % — ABNORMAL HIGH (ref 11.5–15.5)
WBC: 5.8 10*3/uL (ref 4.0–10.5)
nRBC: 0 % (ref 0.0–0.2)

## 2023-11-10 LAB — BASIC METABOLIC PANEL
Anion gap: 8 (ref 5–15)
BUN: 24 mg/dL — ABNORMAL HIGH (ref 8–23)
CO2: 23 mmol/L (ref 22–32)
Calcium: 8.2 mg/dL — ABNORMAL LOW (ref 8.9–10.3)
Chloride: 104 mmol/L (ref 98–111)
Creatinine, Ser: 1.01 mg/dL (ref 0.61–1.24)
GFR, Estimated: >60 mL/min (ref >60)
Glucose, Bld: 108 mg/dL — ABNORMAL HIGH (ref 70–99)
Potassium: 4.4 mmol/L (ref 3.5–5.1)
Sodium: 135 mmol/L (ref 135–145)

## 2023-11-10 LAB — TSH: TSH: 1.584 u[IU]/mL (ref 0.350–4.500)

## 2023-11-10 LAB — MAGNESIUM: Magnesium: 2.3 mg/dL (ref 1.7–2.4)

## 2023-11-10 NOTE — ED Triage Notes (Signed)
Patient BIB [facility] for palpitations hx of stent placement and CABG . Carvedilol prescriptions decreased last week. 120-140s HR.

## 2023-11-10 NOTE — Telephone Encounter (Signed)
The patient and his wife called because at approximately 1:00, he had sudden onset of palpitations.  The time he said he was able to confirm with his Apple watch.  His heart rate is greater than 140.  He is not having chest pain or shortness of breath, but he is aware of the palpitations and they make him feel weak.  The only rate control medication he has is Coreg 3.125 mg  He is not on full anticoagulation.  He is on Plavix for his history of coronary artery disease, but not aspirin or DOAC.  He had atrial flutter 01/2017 that was found to be isthmus dependent right atrial flutter and had an ablation.  Eliquis was discontinued later in 2018.  I discussed options with the patient but advised him that a heart rate that high could give him problems over time.  He does not have any options to try to manage this at home.  I requested he call 911 and come to the emergency room.  They may be able to cardiovert him or give him medication for rate control.  He is in agreement with this plan.  Theodore Demark, PA-C 11/10/2023 2:04 PM

## 2023-11-10 NOTE — Discharge Instructions (Signed)
You are seen in the emergency department for a rapid heart rate.  Cardiology feels you were likely in atrial flutter.  You converted on your own and your lab work did not show any significant abnormalities.  Please continue your current medications and follow-up with the A-fib clinic.  Return if any worsening or concerning symptoms

## 2023-11-10 NOTE — ED Provider Notes (Signed)
[facility] EMERGENCY DEPARTMENT AT [facility] Provider Note   CSN: [id number] Arrival date & time: 11/10/23  1503     History {Add pertinent medical, surgical, social history, OB history to HPI:1} Chief Complaint  Patient presents with   Palpitations    Ruben [name] is a 78 y.o. male.  He has a history of coronary disease, bypass, a flutter, ablation.  He was in his usual state of health today when his Apple Watch alerted him that his heart rate was elevated.  He said his heart rate is usually in the 60s and it was going to 130.  He did not feel them, does not endorse any chest pain dizziness lightheadedness syncope.  He said he may feel little short of breath.  Cardiologist decreased his carvedilol about 10 days ago due to him having a few fatigue episodes and heart rate in the 50s.  The history is provided by the patient.  Palpitations Palpitations quality:  Fast Onset quality:  Sudden Duration:  2 hours Timing:  Constant Progression:  Unchanged Chronicity:  Recurrent Relieved by:  None tried Worsened by:  Nothing Ineffective treatments:  None tried Associated symptoms: shortness of breath   Associated symptoms: no chest pain, no cough, no diaphoresis, no dizziness, no malaise/fatigue, no nausea, no syncope and no vomiting   Risk factors: hx of atrial fibrillation        Home Medications Prior to Admission medications   Medication Sig Start Date End Date Taking? Authorizing Provider  aspirin EC 81 MG tablet Take 1 tablet (81 mg total) by mouth daily. 03/07/17   Allred, Fayrene Fearing, MD  Black Pepper-Turmeric (TURMERIC CURCUMIN) 01-999 MG CAPS     [provider]  carvedilol (COREG) 3.125 MG tablet Take 1 tablet (3.125 mg total) by mouth 2 (two) times daily. 10/31/23 01/29/24  Lennette Bihari, MD  Cholecalciferol (VITAMIN D3) 5000 units CAPS Take 5,000 Units by mouth See admin instructions. Take 5,000 units by mouth once a day and rotate with 10,000 units every  other day    [provider]  clopidogrel (PLAVIX) 75 MG tablet Take 1 tablet (75 mg total) by mouth daily. 07/18/23   Lennette Bihari, MD  Coenzyme Q10 (CO Q-10 PO) Take 2 capsules by mouth daily.    [provider]  FERROUS GLUCONATE-C-FOLIC ACID PO Take 1 capsule by mouth daily in the afternoon.    [provider]  icosapent Ethyl (VASCEPA) 1 g capsule Take 2 capsules (2 g total) by mouth 2 (two) times daily. 07/22/23   Lennette Bihari, MD  isosorbide mononitrate (IMDUR) 60 MG 24 hr tablet TAKE 1 TABLET(60 MG) BY MOUTH DAILY 07/18/23   Lennette Bihari, MD  JARDIANCE 10 MG TABS tablet Take 10 mg by mouth daily.    [provider]  losartan (COZAAR) 25 MG tablet Take 1 tablet (25 mg total) by mouth daily. 07/18/23   Lennette Bihari, MD  magnesium aspartate (MAGINEX) 615 MG tablet Take 120 mg by mouth 2 (two) times daily.    [provider]  nitroGLYCERIN (NITROLINGUAL) 0.4 MG/SPRAY spray Place 1 spray under the tongue every 5 (five) minutes x 3 doses as needed for chest pain. 07/18/23   Lennette Bihari, MD  NUCYNTA ER 200 MG TB12 Take 200 mg by mouth 2 (two) times daily. 12/05/17   [provider]  pioglitazone (ACTOS) 45 MG tablet Take 45 mg by mouth daily.    [provider]  pregabalin (LYRICA) 200 MG capsule Take 200 mg by mouth 3 (three) times daily.     [provider]  Quercetin 250 MG TABS Take 500 mg by mouth QID.    [provider]  ranolazine (RANEXA) 1000 MG SR tablet Take 1 tablet (1,000 mg total) by mouth 2 (two) times daily. 07/18/23   Lennette Bihari, MD  rosuvastatin (CRESTOR) 40 MG tablet Take 1 tablet (40 mg total) by mouth daily. 07/18/23   Lennette Bihari, MD  spironolactone (ALDACTONE) 25 MG tablet TAKE 1/2 TABLET(12.5 MG) BY MOUTH DAILY 07/18/23   Lennette Bihari, MD  Turmeric 500 MG TABS Take 1,000 mg by mouth daily.    [provider]  VITAMIN A PO Take by mouth.    [provider]  vitamin E 180 MG (400 UNITS) capsule Take 400 Units by mouth daily.    [provider]      Allergies    Nortriptyline and Cymbalta [duloxetine hcl]    Review of Systems   Review of Systems  Constitutional:  Negative for diaphoresis and malaise/fatigue.  Respiratory:  Positive for shortness of breath. Negative for cough.   Cardiovascular:  Positive for palpitations. Negative for chest pain and syncope.  Gastrointestinal:  Negative for nausea and vomiting.  Neurological:  Negative for dizziness.    Physical Exam Updated Vital Signs BP (!) 153/105   Pulse (!) 131   Temp 98.1 F (36.7 C) (Oral)   Resp (!) 23   SpO2 99%  Physical Exam Vitals and nursing note reviewed.  Constitutional:      General: He is not in acute distress.    Appearance: Normal appearance. He is well-developed.  HENT:     Head: Normocephalic and atraumatic.  Eyes:     Conjunctiva/sclera: Conjunctivae normal.  Cardiovascular:     Rate and Rhythm: Regular rhythm. Tachycardia present.     Heart sounds: No murmur heard. Pulmonary:     Effort: Pulmonary effort is normal. No respiratory distress.     Breath sounds: Normal breath sounds.  Abdominal:     Palpations: Abdomen is soft.     Tenderness: There is no abdominal tenderness. There is no guarding or rebound.  Musculoskeletal:     Cervical back: Neck supple.     Right lower leg: No edema.     Left lower leg: No edema.  Skin:    General: Skin is warm and dry.     Capillary Refill: Capillary refill takes less than 2 seconds.  Neurological:     General: No focal deficit present.     Mental Status: He is alert.     ED Results / Procedures / Treatments   Labs (all labs ordered are listed, but only abnormal results are displayed) Labs Reviewed  BASIC METABOLIC PANEL  CBC WITH DIFFERENTIAL/PLATELET  MAGNESIUM    EKG None  Radiology No results found.  Procedures Procedures  {Document cardiac monitor, telemetry  assessment procedure when appropriate:1}  Medications Ordered in ED Medications - No data to display  ED Course/ Medical Decision Making/ A&P   {   Click here for ABCD2, HEART and other calculatorsREFRESH Note before signing :1}                              Medical Decision Making Amount and/or Complexity of Data Reviewed Labs: ordered. Radiology: ordered.   This patient complains of ***; this involves an extensive  number of treatment Options and is a complaint that carries with it a high risk of complications and morbidity. The differential includes ***  I ordered, reviewed and interpreted labs, which included *** I ordered medication *** and reviewed PMP when indicated. I ordered imaging studies which included *** and I independently    visualized and interpreted imaging which showed *** Additional history obtained from *** Previous records obtained and reviewed *** I consulted *** and discussed lab and imaging findings and discussed disposition.  Cardiac monitoring reviewed, *** Social determinants considered, *** Critical Interventions: ***  After the interventions stated above, I reevaluated the patient and found *** Admission and further testing considered, ***   {Document critical care time when appropriate:1} {Document review of labs and clinical decision tools ie heart score, Chads2Vasc2 etc:1}  {Document your independent review of radiology images, and any outside records:1} {Document your discussion with family members, caretakers, and with consultants:1} {Document social determinants of health affecting pt's care:1} {Document your decision making why or why not admission, treatments were needed:1} Final Clinical Impression(s) / ED Diagnoses Final diagnoses:  None    Rx / DC Orders ED Discharge Orders     None

## 2023-11-19 ENCOUNTER — Other Ambulatory Visit: Payer: Self-pay | Admitting: Cardiovascular Disease

## 2023-11-19 ENCOUNTER — Ambulatory Visit (HOSPITAL_COMMUNITY)
Admission: RE | Admit: 2023-11-19 | Discharge: 2023-11-19 | Disposition: A | Discharge status: Home / Self Care | Payer: Medicare Other | Source: Ambulatory Visit | Attending: Internal Medicine | Admitting: Internal Medicine

## 2023-11-19 VITALS — BP 130/66 | HR 57 | Ht 69.0 in | Wt 204.0 lb

## 2023-11-19 DIAGNOSIS — I480 Paroxysmal atrial fibrillation: Secondary | ICD-10-CM | POA: Diagnosis not present

## 2023-11-19 DIAGNOSIS — Z7901 Long term (current) use of anticoagulants: Secondary | ICD-10-CM | POA: Insufficient documentation

## 2023-11-19 DIAGNOSIS — I2510 Atherosclerotic heart disease of native coronary artery without angina pectoris: Secondary | ICD-10-CM | POA: Insufficient documentation

## 2023-11-19 DIAGNOSIS — I4892 Unspecified atrial flutter: Secondary | ICD-10-CM | POA: Diagnosis not present

## 2023-11-19 DIAGNOSIS — D6869 Other thrombophilia: Secondary | ICD-10-CM | POA: Diagnosis not present

## 2023-11-19 DIAGNOSIS — I4891 Unspecified atrial fibrillation: Secondary | ICD-10-CM | POA: Insufficient documentation

## 2023-11-19 DIAGNOSIS — Z7902 Long term (current) use of antithrombotics/antiplatelets: Secondary | ICD-10-CM | POA: Insufficient documentation

## 2023-11-19 DIAGNOSIS — Z79899 Other long term (current) drug therapy: Secondary | ICD-10-CM | POA: Insufficient documentation

## 2023-11-19 MED ORDER — APIXABAN 5 MG PO TABS: 5.0000 mg | ORAL_TABLET | Freq: Two times a day (BID) | ORAL | 3 refills | Status: DC

## 2023-11-19 MED ORDER — CARVEDILOL 3.125 MG PO TABS: 3.1250 mg | ORAL_TABLET | Freq: Three times a day (TID) | ORAL | 2 refills | Status: DC

## 2023-11-19 NOTE — Patient Instructions (Addendum)
 STOP aspirin  START Eliquis 5mg  twice a day   INCREASE coreg to 3.125mg  three times a day    Have your labs drawn in 1 month - order attached. Can take to any [facility].

## 2023-11-19 NOTE — Progress Notes (Signed)
 Primary Care Physician: Darrow Bussing, MD Primary Cardiologist: Nicki Guadalajara, MD Electrophysiologist: previously Dr. Johney Frame    Referring Physician: ED     Ruben [name] is a 78 y.o. male with a history of CAD s/p CABG, history of atrial flutter ablation in 2018, and paroxsymal atrial flutter who presents for consultation in the [facility]. Seen by Dr. Tresa Endo on 10/31/23 and coreg decreased from 6.25 mg BID to 3.125 mg BID due to bradycardia and fatigue. He presented to ED on 2/16 with palpitations found to be in atrial flutter. Patient is on dual antiplatelet therapy. He has a CHADS2VASC score of 3.  On evaluation today, he is currently in NSR. Patient wears an Apple watch. He noted palpitations which were brief yesterday. No missed doses of ASA or plavix.    Today, he denies symptoms of chest pain, shortness of breath, orthopnea, PND, lower extremity edema, dizziness, presyncope, syncope, snoring, daytime somnolence, bleeding, or neurologic sequela. The patient is tolerating medications without difficulties and is otherwise without complaint today.    he has a BMI of Body mass index is 30.13 kg/m.Marland Kitchen Filed Weights   11/19/23 1426  Weight: 92.5 kg    Current Outpatient Medications  Medication Sig Dispense Refill   apixaban (ELIQUIS) 5 MG TABS tablet Take 1 tablet (5 mg total) by mouth 2 (two) times daily. 60 tablet 3   Cholecalciferol (VITAMIN D3) 5000 units CAPS Take 5,000 Units by mouth See admin instructions. Take 5,000 units by mouth once a day and rotate with 10,000 units every other day     clopidogrel (PLAVIX) 75 MG tablet Take 1 tablet (75 mg total) by mouth daily. 90 tablet 3   Coenzyme Q10 (CO Q-10 PO) Take 2 capsules by mouth daily.     FERROUS SULFATE PO Take 1 tablet by mouth 3 (three) times a week.     FOLIC ACID PO Take 1 capsule by mouth every morning.     icosapent Ethyl (VASCEPA) 1 g capsule Take 2 capsules (2 g total) by mouth 2 (two)  times daily. 360 capsule 3   JARDIANCE 10 MG TABS tablet Take 10 mg by mouth daily.     losartan (COZAAR) 25 MG tablet Take 1 tablet (25 mg total) by mouth daily. 90 tablet 3   MAGNESIUM PO Take 1 capsule by mouth 2 (two) times daily.     Multiple Vitamins-Minerals (MULTI FOR HIM) TABS 1 Tablet Orally Once a day     nitroGLYCERIN (NITROLINGUAL) 0.4 MG/SPRAY spray Place 1 spray under the tongue every 5 (five) minutes x 3 doses as needed for chest pain. 12 g 11   NUCYNTA ER 200 MG TB12 Take 200 mg by mouth 2 (two) times daily.  0   pioglitazone (ACTOS) 45 MG tablet Take 45 mg by mouth daily.     pregabalin (LYRICA) 200 MG capsule Take 200 mg by mouth 3 (three) times daily.      Quercetin 250 MG TABS Take 500 mg by mouth every morning.     ranolazine (RANEXA) 1000 MG SR tablet Take 1 tablet (1,000 mg total) by mouth 2 (two) times daily. 180 tablet 1   rosuvastatin (CRESTOR) 40 MG tablet Take 1 tablet (40 mg total) by mouth daily. 90 tablet 3   spironolactone (ALDACTONE) 25 MG tablet TAKE 1/2 TABLET(12.5 MG) BY MOUTH DAILY 45 tablet 3   TURMERIC CURCUMIN PO Take 1 tablet by mouth every morning. He is taking a liquid form  VITAMIN A PO Take 1 capsule by mouth every morning.     vitamin E 180 MG (400 UNITS) capsule Take 400 Units by mouth daily.     carvedilol (COREG) 3.125 MG tablet Take 1 tablet (3.125 mg total) by mouth in the morning, at noon, and at bedtime. 270 tablet 2   isosorbide mononitrate (IMDUR) 60 MG 24 hr tablet TAKE 1 TABLET(60 MG) BY MOUTH DAILY 90 tablet 2   No current facility-administered medications for this encounter.    Atrial Fibrillation Management history:  Previous antiarrhythmic drugs: none Previous cardioversions: none Previous ablations: 2018 (atrial flutter) Anticoagulation history: Eliquis   ROS- All systems are reviewed and negative except as per the HPI above.  Physical Exam: BP 130/66   Pulse (!) 57   Ht 5\' 9"  (1.753 m)   Wt 92.5 kg   BMI 30.13  kg/m   GEN: Well nourished, well developed in no acute distress NECK: No JVD; No carotid bruits CARDIAC: Regular rate and rhythm, no murmurs, rubs, gallops RESPIRATORY:  Clear to auscultation without rales, wheezing or rhonchi  ABDOMEN: Soft, non-tender, non-distended EXTREMITIES:  No edema; No deformity   EKG today demonstrates  Vent. rate 57 BPM PR interval 286 ms QRS duration 106 ms QT/QTcB 426/414 ms P-R-T axes 78 -51 26 Sinus bradycardia with 1st degree A-V block Left axis deviation Abnormal ECG When compared with ECG of 10-Nov-2023 16:28, PREVIOUS ECG IS PRESENT  Echo 05/23/23 demonstrated  1. Left ventricular ejection fraction, by estimation, is 60 to 65%. The  left ventricle has normal function. The left ventricle has no regional  wall motion abnormalities. Left ventricular diastolic parameters were  normal. The average left ventricular  global longitudinal strain is -27.1 %. The global longitudinal strain is  normal.   2. Right ventricular systolic function is normal. The right ventricular  size is normal.   3. Left atrial size was mildly dilated.   4. The mitral valve is normal in structure. Trivial mitral valve  regurgitation. No evidence of mitral stenosis.   5. The aortic valve is tricuspid. Aortic valve regurgitation is mild.  Aortic valve sclerosis is present, with no evidence of aortic valve  stenosis.   6. Aortic dilatation noted. There is mild dilatation of the ascending  aorta, measuring 39 mm.   7. The inferior vena cava is normal in size with greater than 50%  respiratory variability, suggesting right atrial pressure of 3 mmHg.    ASSESSMENT & PLAN CHA2DS2-VASc Score = 3  The patient's score is based upon: CHF History: 0 HTN History: 0 Diabetes History: 0 Stroke History: 0 Vascular Disease History: 1 Age Score: 2 Gender Score: 0       ASSESSMENT AND PLAN: Paroxysmal Atrial Flutter The patient's CHA2DS2-VASc score is 3, indicating a 3.2%  annual risk of stroke.    He is currently in NSR. Discussion of plan of care with primary cardiologist. After discussion, will begin Eliquis 5 mg BID. Stop ASA. Continue plavix 75 mg daily. Increase carvedilol to 6.25 mg AM, 3.125 mg PM for improved rate control. Patient advised to monitor BP/HR. If he notes increased burden of arrhythmia, would be reasonable to reestablish with EP to discuss repeat ablation.   Secondary Hypercoagulable State (ICD10:  D68.69) The patient is at significant risk for stroke/thromboembolism based upon his CHA2DS2-VASc Score of 3.  Start Apixaban (Eliquis).  Discussion of risks vs benefits of OAC for prevention of stroke related to atrial flutter. After discussion, patient agrees to begin  anticoagulation. Will begin Eliquis 5 mg BID. Stop ASA. Continue plavix. Check CBC in 1 month.     Follow up 3 months to reassess burden.   Lake Bells, PA-C  [facility] [address] [place], [place] [address] [phone]

## 2023-11-22 ENCOUNTER — Other Ambulatory Visit (HOSPITAL_COMMUNITY): Payer: Self-pay | Admitting: *Deleted

## 2023-11-22 MED ORDER — APIXABAN 5 MG PO TABS: 5.0000 mg | ORAL_TABLET | Freq: Two times a day (BID) | ORAL | 2 refills | Status: DC

## 2023-11-29 DIAGNOSIS — G609 Hereditary and idiopathic neuropathy, unspecified: Secondary | ICD-10-CM | POA: Diagnosis not present

## 2023-11-29 DIAGNOSIS — Z5181 Encounter for therapeutic drug level monitoring: Secondary | ICD-10-CM | POA: Diagnosis not present

## 2023-11-29 DIAGNOSIS — Z79899 Other long term (current) drug therapy: Secondary | ICD-10-CM | POA: Diagnosis not present

## 2023-11-29 DIAGNOSIS — G894 Chronic pain syndrome: Secondary | ICD-10-CM | POA: Diagnosis not present

## 2023-12-02 DIAGNOSIS — L989 Disorder of the skin and subcutaneous tissue, unspecified: Secondary | ICD-10-CM | POA: Diagnosis not present

## 2023-12-02 DIAGNOSIS — I4892 Unspecified atrial flutter: Secondary | ICD-10-CM | POA: Diagnosis not present

## 2023-12-16 DIAGNOSIS — I2510 Atherosclerotic heart disease of native coronary artery without angina pectoris: Secondary | ICD-10-CM | POA: Diagnosis not present

## 2023-12-16 DIAGNOSIS — I10 Essential (primary) hypertension: Secondary | ICD-10-CM | POA: Diagnosis not present

## 2023-12-16 DIAGNOSIS — E1142 Type 2 diabetes mellitus with diabetic polyneuropathy: Secondary | ICD-10-CM | POA: Diagnosis not present

## 2023-12-23 DIAGNOSIS — D1801 Hemangioma of skin and subcutaneous tissue: Secondary | ICD-10-CM | POA: Diagnosis not present

## 2023-12-26 ENCOUNTER — Other Ambulatory Visit (HOSPITAL_COMMUNITY): Payer: Self-pay | Admitting: Internal Medicine

## 2023-12-26 DIAGNOSIS — I4891 Unspecified atrial fibrillation: Secondary | ICD-10-CM | POA: Diagnosis not present

## 2023-12-26 LAB — CBC
Hematocrit: 43.4 % (ref 37.5–51.0)
Hemoglobin: 14.3 g/dL (ref 13.0–17.7)
MCH: 30 pg (ref 26.6–33.0)
MCHC: 32.9 g/dL (ref 31.5–35.7)
MCV: 91 fL (ref 79–97)
Platelets: 200 10*3/uL (ref 150–450)
RBC: 4.77 x10E6/uL (ref 4.14–5.80)
RDW: 14.8 % (ref 11.6–15.4)
WBC: 4.9 10*3/uL (ref 3.4–10.8)

## 2024-01-03 DIAGNOSIS — Z23 Encounter for immunization: Secondary | ICD-10-CM | POA: Diagnosis not present

## 2024-01-14 DIAGNOSIS — I10 Essential (primary) hypertension: Secondary | ICD-10-CM | POA: Diagnosis not present

## 2024-01-14 DIAGNOSIS — I2510 Atherosclerotic heart disease of native coronary artery without angina pectoris: Secondary | ICD-10-CM | POA: Diagnosis not present

## 2024-01-14 DIAGNOSIS — E1142 Type 2 diabetes mellitus with diabetic polyneuropathy: Secondary | ICD-10-CM | POA: Diagnosis not present

## 2024-01-21 NOTE — Progress Notes (Signed)
 I saw UWAIS BOMMER in neurology clinic on 01/31/24 in follow up for neuropathy.  HPI: OAKLAND DIRKSEN is a 78 y.o. year old male with a history of chronic pain (on Nucynta ), HTN, DM, CAD s/p CABG and PCI, spondylosis s/p C3-C7 fusion (~2000), aflutter who we last saw on 08/01/23.  To briefly review: 07/27/22: Patient has C3-C7 fusion for right arm symptoms around 2000. After surgery, he began noticing pain in bilateral feet. Patient saw neurology at [facility] many years ago. Had an EMG at [facility] in 2001 for progressive numbness and burning in feet s/p tarsal tunnel release in right foot that was consistent with PN (see report below - from care every where).   He current levels of pain are generally around 2/10. When he as flairs he has shooting electric pains and grabbing sensation in his calves. He has tingling in his legs. He has numbness and can have skin sensitivity. He gets a flair about once per month that will last overnight. He gets it back under control with sleep, hemp oil or an extra small dose of Lyrica  or oxycodone .   He has had one fall this year. He tripped going up the stairs. Patient got a Optician, dispensing from [employer] about new treatments for neuropathy (chiropractors). He went for 2 visits. They offered LED light treatment (patient had tried this before and it helped with balance), a treatment to wake up his nerves, and an anti-inflammatory diet. He was told they could improve his legs by 40% for $7000.   The primary reason patient is here to discuss new innovations for people with neuropathy and wanted another opinion before spending $7000. Patient is interested in checking his B6 and B12 as well. Patient would also like to know if his pain medications are appropriate. Patient is on Lyrica  200 mg TID. He also is on opioids. He sees pain management for this.   Prior medications that have been tried: gabapentin, Cymbalta, creams (did not help or did not tolerate)   He takes a b  complex (Metanx) and Hemp oil   EtOH use: very rare  Restrictive diet? Gluten free Family history of neuropathy/myopathy/NM disease? No  08/01/23: Labs showed elevated B1 and B6, likely due to patient taking B complex. I recommended he discontinue B complex to avoid B6 toxicity. He has stopped this. He is taking alpha lipoic acid daily. He takes Lyrica  200 mg TID. He feels this is helping and is happy with current pain control.   Patient continues to have numbness and tingling. His symptoms have not significantly changed. He fell once since last visit. He was walking on concrete and caught a gap in the concrete, his right foot caught it, and he fell. He had a skinned up knee. He continues to have imbalance unless he pays attention closely.   Twice since our last visit, he has had an episode that he had difficulty moving his right leg, requiring him to think about it before it would move. Once he got moving, his leg worked fine. He denies tremors. His sense of smell is good. He denies any symptoms of orthostatic hypotension. He denies symptoms consistent with REM sleep disorder. He denies pseudobulbar affect.  Most recent Assessment and Plan (08/01/23): This is TERRICK DANKERS, a 78 y.o. male with numbness and tingling in bilateral lower extremities. He has a relevant medical history of chronic pain (on Nucynta ), HTN, DM, CAD s/p CABG and PCI, spondylosis s/p C3-C7 fusion (~2000). His  neurological examination is pertinent for diminished sensation in bilateral lower extremities in a length dependent pattern. Patient mentioned difficulty moving his right leg on a couple of occasions. There is no definitive evidence of parkinsonism today. I will continue to monitor though. These symptoms could be due to his neuropathy and poor proprioception, but that is not currently clear. 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 Labs were significant for mildly  elevated B6 due to B complex. He has since discontinued the B complex supplement. His pain symptoms are currently well controlled on Nucynta  and Lyrica  200 mg TID.   Plan: -Alpha lipoic acid 600 mg BID -Continue Lyrica  200 mg TID -Discussed daily foot care -Discuss fall precautions  Since their last visit: Patient's numbness and tingling in his legs. He is doing well on Lyrica  200 mg TID. He is taking alpha lipoic acid 600 mg once daily. At last visit he mentioned right leg not moving when he wanted, but this has not been an issue of late. He does endorse fatigue after working in the yard, more than usual.  He has no new complaints. He does have aflutter again (previous ablation).    MEDICATIONS:  Outpatient Encounter Medications as of 01/31/2024  Medication Sig   apixaban  (ELIQUIS ) 5 MG TABS tablet Take 1 tablet (5 mg total) by mouth 2 (two) times daily.   carvedilol  (COREG ) 3.125 MG tablet Take 1 tablet (3.125 mg total) by mouth in the morning, at noon, and at bedtime.   Cholecalciferol (VITAMIN D3) 5000 units CAPS Take 5,000 Units by mouth See admin instructions. Take 5,000 units by mouth once a day and rotate with 10,000 units every other day   clopidogrel  (PLAVIX ) 75 MG tablet Take 1 tablet (75 mg total) by mouth daily.   Coenzyme Q10 (CO Q-10 PO) Take 2 capsules by mouth daily.   diltiazem  (CARDIZEM  CD) 120 MG 24 hr capsule Take 1 capsule (120 mg total) by mouth daily.   diltiazem  (CARDIZEM ) 30 MG tablet Take 1 tablet every 4 hours AS NEEDED for AFIB heart rate >100 as long as top BP >100.   FERROUS SULFATE PO Take 1 tablet by mouth 3 (three) times a week.   FOLIC ACID  PO Take 1 capsule by mouth every morning.   icosapent  Ethyl (VASCEPA ) 1 g capsule Take 2 capsules (2 g total) by mouth 2 (two) times daily.   isosorbide  mononitrate (IMDUR ) 60 MG 24 hr tablet TAKE 1 TABLET(60 MG) BY MOUTH DAILY   JARDIANCE 10 MG TABS tablet Take 10 mg by mouth daily.   losartan  (COZAAR ) 25 MG tablet  Take 1 tablet (25 mg total) by mouth daily.   MAGNESIUM PO Take 1 capsule by mouth 2 (two) times daily.   Multiple Vitamins-Minerals (MULTI FOR HIM) TABS 1 Tablet Orally Once a day   nitroGLYCERIN  (NITROLINGUAL ) 0.4 MG/SPRAY spray Place 1 spray under the tongue every 5 (five) minutes x 3 doses as needed for chest pain.   NUCYNTA  ER 200 MG TB12 Take 200 mg by mouth 2 (two) times daily.   pioglitazone (ACTOS) 45 MG tablet Take 45 mg by mouth daily.   pregabalin  (LYRICA ) 200 MG capsule Take 200 mg by mouth 3 (three) times daily.    Quercetin 250 MG TABS Take 500 mg by mouth every morning.   ranolazine  (RANEXA ) 1000 MG SR tablet Take 1 tablet (1,000 mg total) by mouth 2 (two) times daily.   rosuvastatin  (CRESTOR ) 40 MG tablet Take  1 tablet (40 mg total) by mouth daily.   spironolactone  (ALDACTONE ) 25 MG tablet TAKE 1/2 TABLET(12.5 MG) BY MOUTH DAILY   TURMERIC CURCUMIN PO Take 1 tablet by mouth every morning. He is taking a liquid form   VITAMIN A PO Take 1 capsule by mouth every morning.   vitamin E 180 MG (400 UNITS) capsule Take 400 Units by mouth daily.   [DISCONTINUED] nitroGLYCERIN  (NITROLINGUAL ) 0.4 MG/SPRAY spray Place 1 spray under the tongue every 5 (five) minutes x 3 doses as needed for chest pain.   No facility-administered encounter medications on file as of 01/31/2024.    PAST MEDICAL HISTORY: Past Medical History:  Diagnosis Date   Atrial flutter (HCC) 02/02/2017   CAD, multiple vessel    Diabetes mellitus (HCC)    Dyslipidemia    Hypertension     PAST SURGICAL HISTORY: Past Surgical History:  Procedure Laterality Date   A-FLUTTER ABLATION N/A 02/05/2017   Procedure: A-Flutter Ablation;  Surgeon: Jolly Needle, MD;  Location: [facility] INVASIVE CV LAB;  Service: Cardiovascular;  Laterality: N/A;   CARDIAC CATHETERIZATION  12/13/2011   No intervention - recommend medical therapy with increased medical trial   CARDIOVASCULAR STRESS TEST  10/26/2010   No scintigraphic evidence of  inducible myocardial ischemia. No ECG changes. EKG negative for ischemia.   CORONARY ARTERY BYPASS GRAFT  1997   LIMA-LAD, vein graft to diagonal, vein graft to obtuse marginal, and vein graft to RCA   LEFT HEART CATH AND CORS/GRAFTS ANGIOGRAPHY N/A 09/29/2018   Procedure: LEFT HEART CATH AND CORS/GRAFTS ANGIOGRAPHY;  Surgeon: Arleen Lacer, MD;  Location: [facility] INVASIVE CV LAB;  Service: Cardiovascular;  Laterality: N/A;   LEFT HEART CATHETERIZATION WITH CORONARY/GRAFT ANGIOGRAM N/A 12/13/2011   Procedure: LEFT HEART CATHETERIZATION WITH Estella Helling;  Surgeon: Millicent Ally, MD;  Location: [facility] CATH LAB;  Service: Cardiovascular;  Laterality: N/A;   LOWER EXTREMITY ARTERIAL DOPPLER  01/30/2000   Normal arterial study   TEE WITHOUT CARDIOVERSION N/A 02/04/2017   Procedure: TRANSESOPHAGEAL ECHOCARDIOGRAM (TEE);  Surgeon: Liza Riggers, MD;  Location: [facility] ENDOSCOPY;  Service: Cardiovascular;  Laterality: N/A;   TRANSTHORACIC ECHOCARDIOGRAM  12/12/2011   EF >55%, LA severely dilated.   ULTRASOUND GUIDANCE FOR VASCULAR ACCESS  09/29/2018   Procedure: Ultrasound Guidance For Vascular Access;  Surgeon: Arleen Lacer, MD;  Location: [facility] INVASIVE CV LAB;  Service: Cardiovascular;;    ALLERGIES: Allergies  Allergen Reactions   Nortriptyline Other (See Comments)    Groggy at low dose   Cymbalta [Duloxetine Hcl] Diarrhea   Niacin  Other (See Comments)    Other Reaction(s): Flushing (ALLERGY/intolerance)   Duloxetine     Other Reaction(s): Abdominal Pain, GI Intolerance    FAMILY HISTORY: Family History  Problem Relation Age of Onset   Heart disease Father    Diabetes Father    Hyperlipidemia Sister     SOCIAL HISTORY: Social History   Tobacco Use   Smoking status: Never   Smokeless tobacco: Never   Tobacco comments:    Never smoked 01/30/24  Vaping Use   Vaping status: Never Used  Substance Use Topics   Alcohol use: Yes    Comment: occas   Drug use: Never   Social  History   Social History Narrative   Are you right handed or left handed? right   Are you currently employed ? retired   Caffeine 1 soda a day   Do you live at home alone?wife      What  type of home do you live in: 1 story or 2 story? two        Objective:  Vital Signs:  BP 131/61   Pulse (!) 57   Ht 5\' 9"  (1.753 m)   Wt 205 lb (93 kg)   SpO2 97%   BMI 30.27 kg/m   General: General appearance: Awake and alert. No distress. Cooperative with exam.  Skin: No obvious rash or jaundice. HEENT: Atraumatic. Anicteric. Lungs: Non-labored breathing on room air  Extremities: No edema.  Neurological: Mental Status: Alert. Speech fluent. No pseudobulbar affect Cranial Nerves: CNII: No RAPD. Visual fields intact. CNIII, IV, VI: PERRL. No nystagmus. EOMI. CN V: Facial sensation intact bilaterally to fine touch. CN VII: Facial muscles symmetric and strong. No ptosis at rest. CN VIII: Hears finger rub well bilaterally. CN IX: No hypophonia. CN X: Palate elevates symmetrically. CN XI: Full strength shoulder shrug bilaterally. CN XII: Tongue protrusion full and midline. No atrophy or fasciculations. No significant dysarthria Motor: Tone is normal. Strength is 5/5 in bilateral upper and lower extremities. Reflexes:  Right Left  Bicep 2+ 2+  Tricep 2+ 2+  BrRad 2+ 2+  Knee 2+ 2+  Ankle 0 0   Sensation: Pinprick: Diminished to lower calf bilaterally Coordination: Intact finger-to- nose-finger and heel-to-shin bilaterally. Romberg with mild sway. Gait: Wide-based gait.   Lab and Test Review: New results: CBC (12/26/23) unremarkable  11/10/23: TSH wnl BMP significant for glucose 108  Previously reviewed results: 07/28/23: B1: 48 (elevated) B6: 53.4 (elevated) IFE: No M protein   External labs (12/20/22): B12: 622 Vit D wnl HbA1c: 5.7 Lipid panel wnl   External labs: Normal or unremarkable: CMP, TSH, vit D B12: > 1500 (12/12/21) HbA1c 5.6   MRI cervical spine wo  contrast (07/01/98): IMPRESSION  1.  SMALL FOCAL CENTRAL DISC PROTRUSION, C7-T1.  2.  HARD AND SOFT DISC C3-4 CENTRAL AND TO THE LEFT WITH LEFT C-4 NERVE ROOT ENCROACHMENT.  3.  CHANGES OF DIFFUSE MULTILEVEL SPONDYLOSIS FROM C-4 THROUGH C-7 AS DESCRIBED, WITH BORDERLINE  CANAL STENOSIS BUT NO SEVERE CORD FLATTENING - SEE REPORT.   EMG (10/13/1999): SUMMARY:     NCS:  Left peroneal, median and ulnar motor responses revealed  slowed conduction velocity and prolonged F-waves.  Left tibial motor  response showed reduced amplitude, slowed velocity and prolonged  F-waves.  Left sural and bilateral medial and lateral plantar  sensory responses were unobtainable.  Left median mixed responses  were normal.  Left median and ulnar digital sensory responses had  reduced amplitude.     EMG:  Needle exam of selected left lower extremity muscles  revealed complex motor units distally.   CONCLUSIONS:     This is an abnormal study.  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  Due to the  severity of the peripheral neuropathy, presence of a superimposed  medial and lateral plantar neuropathy as seen in tarsal tunnel  syndrome cannot be determined. There is also no evidence of other  entrapment neuropathies in left upper extremity.   ASSESSMENT: This is Andre Kawasaki, a 78 y.o. male with numbness and tingling in bilateral lower extremities. 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 His pain symptoms are currently well controlled on Nucynta  and Lyrica  200 mg TID.   Plan: -Continue Lyrica  200 mg three times daily -Continue Alpha lipoic  acid 600 mg daily -Discussed fall precautions  Return to clinic in 1 year  Rommie Coats, MD

## 2024-01-22 DIAGNOSIS — E7800 Pure hypercholesterolemia, unspecified: Secondary | ICD-10-CM | POA: Diagnosis not present

## 2024-01-22 DIAGNOSIS — I10 Essential (primary) hypertension: Secondary | ICD-10-CM | POA: Diagnosis not present

## 2024-01-22 DIAGNOSIS — I2510 Atherosclerotic heart disease of native coronary artery without angina pectoris: Secondary | ICD-10-CM | POA: Diagnosis not present

## 2024-01-22 DIAGNOSIS — E1142 Type 2 diabetes mellitus with diabetic polyneuropathy: Secondary | ICD-10-CM | POA: Diagnosis not present

## 2024-01-24 ENCOUNTER — Telehealth (HOSPITAL_COMMUNITY): Payer: Self-pay | Admitting: *Deleted

## 2024-01-24 MED ORDER — DILTIAZEM HCL 30 MG PO TABS: ORAL_TABLET | ORAL | 1 refills | Status: DC

## 2024-01-24 NOTE — Telephone Encounter (Signed)
 Patient called in stating he has been out of rhythm since last night with heart rates in the 130s. Overall feels ok. BP 133/89. Discussed with Caesar Caster PA will increase coreg  to 6.25mg  BID and call in PRN cardizem  30mg  tablets to use every 4 hours as needed for elevated rates. No missed doses of Eliquis .  Pt to call Monday with update. If still out of rhythm will bring in for assessment. Pt in agreement.

## 2024-01-27 ENCOUNTER — Telehealth: Payer: Self-pay | Admitting: Cardiovascular Disease

## 2024-01-27 ENCOUNTER — Emergency Department (HOSPITAL_COMMUNITY)
Admission: EM | Admit: 2024-01-27 | Discharge: 2024-01-27 | Disposition: A | Discharge status: Home / Self Care | Attending: Emergency Medicine | Admitting: Emergency Medicine

## 2024-01-27 ENCOUNTER — Emergency Department (HOSPITAL_COMMUNITY)

## 2024-01-27 ENCOUNTER — Encounter (HOSPITAL_COMMUNITY): Payer: Self-pay

## 2024-01-27 DIAGNOSIS — Z951 Presence of aortocoronary bypass graft: Secondary | ICD-10-CM | POA: Diagnosis not present

## 2024-01-27 DIAGNOSIS — Z7901 Long term (current) use of anticoagulants: Secondary | ICD-10-CM | POA: Insufficient documentation

## 2024-01-27 DIAGNOSIS — R7989 Other specified abnormal findings of blood chemistry: Secondary | ICD-10-CM

## 2024-01-27 DIAGNOSIS — I483 Typical atrial flutter: Secondary | ICD-10-CM | POA: Diagnosis not present

## 2024-01-27 DIAGNOSIS — R9389 Abnormal findings on diagnostic imaging of other specified body structures: Secondary | ICD-10-CM | POA: Diagnosis not present

## 2024-01-27 DIAGNOSIS — R000 Tachycardia, unspecified: Secondary | ICD-10-CM | POA: Diagnosis not present

## 2024-01-27 DIAGNOSIS — E119 Type 2 diabetes mellitus without complications: Secondary | ICD-10-CM | POA: Diagnosis not present

## 2024-01-27 DIAGNOSIS — I484 Atypical atrial flutter: Secondary | ICD-10-CM | POA: Diagnosis not present

## 2024-01-27 DIAGNOSIS — I4892 Unspecified atrial flutter: Secondary | ICD-10-CM | POA: Diagnosis not present

## 2024-01-27 DIAGNOSIS — Z7902 Long term (current) use of antithrombotics/antiplatelets: Secondary | ICD-10-CM | POA: Insufficient documentation

## 2024-01-27 LAB — BASIC METABOLIC PANEL WITH GFR
Anion gap: 12 (ref 5–15)
BUN: 21 mg/dL (ref 8–23)
CO2: 22 mmol/L (ref 22–32)
Calcium: 8.8 mg/dL — ABNORMAL LOW (ref 8.9–10.3)
Chloride: 99 mmol/L (ref 98–111)
Creatinine, Ser: 0.89 mg/dL (ref 0.61–1.24)
GFR, Estimated: >60 mL/min (ref >60)
Glucose, Bld: 112 mg/dL — ABNORMAL HIGH (ref 70–99)
Potassium: 4.2 mmol/L (ref 3.5–5.1)
Sodium: 133 mmol/L — ABNORMAL LOW (ref 135–145)

## 2024-01-27 LAB — TROPONIN I (HIGH SENSITIVITY)
Troponin I (High Sensitivity): 74 ng/L — ABNORMAL HIGH (ref <18)
Troponin I (High Sensitivity): 100 ng/L (ref <18)

## 2024-01-27 LAB — CBC
HCT: 41 % (ref 39.0–52.0)
Hemoglobin: 13.8 g/dL (ref 13.0–17.0)
MCH: 29.6 pg (ref 26.0–34.0)
MCHC: 33.7 g/dL (ref 30.0–36.0)
MCV: 87.8 fL (ref 80.0–100.0)
Platelets: 212 10*3/uL (ref 150–400)
RBC: 4.67 MIL/uL (ref 4.22–5.81)
RDW: 16.8 % — ABNORMAL HIGH (ref 11.5–15.5)
WBC: 12.4 10*3/uL — ABNORMAL HIGH (ref 4.0–10.5)
nRBC: 0 % (ref 0.0–0.2)

## 2024-01-27 LAB — MAGNESIUM: Magnesium: 2.1 mg/dL (ref 1.7–2.4)

## 2024-01-27 MED ORDER — SODIUM CHLORIDE 0.9 % IV BOLUS
1000.0000 mL | Freq: Once | INTRAVENOUS | Status: AC
  Administered 2024-01-27: 1000 mL via INTRAVENOUS

## 2024-01-27 MED ORDER — DILTIAZEM HCL 25 MG/5ML IV SOLN
10.0000 mg | Freq: Once | INTRAVENOUS | Status: AC
  Administered 2024-01-27: 10 mg via INTRAVENOUS
  Filled 2024-01-27: qty 5

## 2024-01-27 NOTE — ED Provider Notes (Signed)
 Assume Care - Medical Decision Making  Care of patient assumed from previous emergency medicine provider. See their note for further details of history, physical exam and plan.  Briefly, Ruben [name] is a 78 y.o. male who presents with  Clinical Course as of 01/27/24 1711  Mon Jan 27, 2024  1508 S; hx 5v CABG, stents; ablation for a-flutter prev; now back in a-flutter; is on AC and PO dilt at home; discussed with cards -> hold spironolactone  and close follow-up; initial trop was elevated; f/u delta trop and likely d/c [WC]    Clinical Course User Index [WC] Arminda Landmark, MD     Reassessment:  I personally reassessed the patient: Vital Signs:  The most current vitals were     01/27/2024    4:30 PM 01/27/2024    4:00 PM 01/27/2024    3:30 PM  Vitals with BMI  Systolic 140 118 161  Diastolic 70 61 58  Pulse 65 62 70   333 Hemodynamics:  The patient is hemodynamically stable. Mental Status:  The patient is alert   Additional MDM/ED Course: I reevaluated the patient and patient feels well.  He describes no chest pain.  Troponin came back elevated at 100.  I rediscussed the patient with cardiology.  They evaluate the patient the bedside and they feel the patient can be managed in the outpatient setting.  I feel this is reasonable given his history as well as him being a tachyarrhythmia.  He is in sinus rhythm on the monitor.  I discussed this with the patient and he is agreeable with discharge at this time.    Arminda Landmark, MD 01/27/24 Lasandra Points    Clay Cummins, MD 01/28/24 [phone]

## 2024-01-27 NOTE — Telephone Encounter (Signed)
 Left message to discuss

## 2024-01-27 NOTE — ED Provider Notes (Signed)
 [place] EMERGENCY DEPARTMENT AT [facility] Provider Note   CSN: [id number] Arrival date & time: 01/27/24  1324     History  No chief complaint on file.   Ruben [name] is a 78 y.o. male with past medical history significant for 5 vessel coronary artery bypass graft, previous catheterizations, history of a flutter status post ablation, diabetes, hyperlipidemia who presents concern for intermittent tachycardia with rate 130s to 140s and dizziness on standing for 4 days.  He denies any chest pain, shortness of breath.  Reports the episodes normally start with exertion.  He contacted the A-fib clinic over the weekend and they started him on diltiazem  which he said he took 4 doses of over the weekend with minimal improvement.  He is on Eliquis  chronically and reports he has had no missed doses.  No other recent life changes.  HPI     Home Medications Prior to Admission medications   Medication Sig Start Date End Date Taking? Authorizing Provider  apixaban  (ELIQUIS ) 5 MG TABS tablet Take 1 tablet (5 mg total) by mouth 2 (two) times daily. 11/22/23   Nathanel Bal, PA-C  carvedilol  (COREG ) 3.125 MG tablet Take 1 tablet (3.125 mg total) by mouth in the morning, at noon, and at bedtime. 11/19/23 02/17/24  Nathanel Bal, PA-C  Cholecalciferol (VITAMIN D3) 5000 units CAPS Take 5,000 Units by mouth See admin instructions. Take 5,000 units by mouth once a day and rotate with 10,000 units every other day    [provider]  clopidogrel  (PLAVIX ) 75 MG tablet Take 1 tablet (75 mg total) by mouth daily. 07/18/23   Millicent Ally, MD  Coenzyme Q10 (CO Q-10 PO) Take 2 capsules by mouth daily.    [provider]  diltiazem  (CARDIZEM ) 30 MG tablet Take 1 tablet every 4 hours AS NEEDED for AFIB heart rate >100 as long as top BP >100. 01/24/24   Nathanel Bal, PA-C  FERROUS SULFATE PO Take 1 tablet by mouth 3 (three) times a week.    [provider]  FOLIC ACID  PO  Take 1 capsule by mouth every morning.    [provider]  icosapent  Ethyl (VASCEPA ) 1 g capsule Take 2 capsules (2 g total) by mouth 2 (two) times daily. 07/22/23   Millicent Ally, MD  isosorbide  mononitrate (IMDUR ) 60 MG 24 hr tablet TAKE 1 TABLET(60 MG) BY MOUTH DAILY 11/19/23   Millicent Ally, MD  JARDIANCE 10 MG TABS tablet Take 10 mg by mouth daily.    [provider]  losartan  (COZAAR ) 25 MG tablet Take 1 tablet (25 mg total) by mouth daily. 07/18/23   Millicent Ally, MD  MAGNESIUM PO Take 1 capsule by mouth 2 (two) times daily.    [provider]  Multiple Vitamins-Minerals (MULTI FOR HIM) TABS 1 Tablet Orally Once a day    [provider]  nitroGLYCERIN  (NITROLINGUAL ) 0.4 MG/SPRAY spray Place 1 spray under the tongue every 5 (five) minutes x 3 doses as needed for chest pain. 07/18/23   Millicent Ally, MD  NUCYNTA  ER 200 MG TB12 Take 200 mg by mouth 2 (two) times daily. 12/05/17   [provider]  pioglitazone (ACTOS) 45 MG tablet Take 45 mg by mouth daily.    [provider]  pregabalin  (LYRICA ) 200 MG capsule Take 200 mg by mouth 3 (three) times daily.     [provider]  Quercetin 250 MG TABS Take 500 mg  by mouth every morning.    [provider]  ranolazine  (RANEXA ) 1000 MG SR tablet Take 1 tablet (1,000 mg total) by mouth 2 (two) times daily. 07/18/23   Millicent Ally, MD  rosuvastatin  (CRESTOR ) 40 MG tablet Take 1 tablet (40 mg total) by mouth daily. 07/18/23   Millicent Ally, MD  spironolactone  (ALDACTONE ) 25 MG tablet TAKE 1/2 TABLET(12.5 MG) BY MOUTH DAILY 07/18/23   Millicent Ally, MD  TURMERIC CURCUMIN PO Take 1 tablet by mouth every morning. He is taking a liquid form    [provider]  VITAMIN A PO Take 1 capsule by mouth every morning.    [provider]  vitamin E 180 MG (400 UNITS) capsule Take 400 Units by mouth daily.    [provider]      Allergies     Nortriptyline, Cymbalta [duloxetine hcl], Metformin, Niacin , and Duloxetine    Review of Systems   Review of Systems  All other systems reviewed and are negative.   Physical Exam Updated Vital Signs BP (!) 140/86 (BP Location: Left Arm)   Pulse (!) 132   Temp 97.6 F (36.4 C) (Oral)   Resp 16   SpO2 100%  Physical Exam Vitals and nursing note reviewed.  Constitutional:      General: He is not in acute distress.    Appearance: Normal appearance.  HENT:     Head: Normocephalic and atraumatic.  Eyes:     General:        Right eye: No discharge.        Left eye: No discharge.  Cardiovascular:     Rate and Rhythm: Regular rhythm. Tachycardia present.     Heart sounds: No murmur heard.    No friction rub. No gallop.  Pulmonary:     Effort: Pulmonary effort is normal.     Breath sounds: Normal breath sounds.     Comments: No wheezing, rhonchi, stridor, rales Abdominal:     General: Bowel sounds are normal.     Palpations: Abdomen is soft.  Skin:    General: Skin is warm and dry.     Capillary Refill: Capillary refill takes less than 2 seconds.  Neurological:     Mental Status: He is alert and oriented to person, place, and time.  Psychiatric:        Mood and Affect: Mood normal.        Behavior: Behavior normal.     ED Results / Procedures / Treatments   Labs (all labs ordered are listed, but only abnormal results are displayed) Labs Reviewed - No data to display  EKG None  Radiology No results found.  Procedures Procedures    Medications Ordered in ED Medications - No data to display  ED Course/ Medical Decision Making/ A&P Clinical Course as of 01/27/24 1512  Mon Jan 27, 2024  1508 S; hx 5v CABG, stents; ablation for a-flutter prev; now back in a-flutter; is on AC and PO dilt at home; discussed with cards -> hold spironolactone  and close follow-up; initial trop was elevated; f/u delta trop and likely d/c [WC]    Clinical Course User Index [WC]  Arminda Landmark, MD                                  Medical Decision Making Amount and/or Complexity of Data Reviewed Labs: ordered. Radiology: ordered.  Risk Prescription drug management.  This patient is a 78 y.o. male  who presents to the ED for concern of tachycardia.   Differential diagnoses prior to evaluation: The emergent differential diagnosis includes, but is not limited to,  The differential diagnosis for palpitations includes cardiac arrhythmias, PVC/PAC, ACS, Cardiomyopathy, CHF, MVP, pericarditis, valvular disease, Panic/Anxiety, Somatic disorder, ETOH, Caffeine,  Stimulant use, medication side effect, Anemia, Hyperthyroidism, pulmonary embolism.  Overall given his history suspect a flutter, versus ectopic atrial tachycardia.  Rate to slow for SVT. This is not an exhaustive differential.   Past Medical History / Co-morbidities / Social History: Coronary artery disease, hyperlipidemia, diabetes, a flutter, chronically anticoagulated on Eliquis , history of 5 vessel CABG remotely and 97, has had catheterization and stents since then as well.  He was status post ablation but has recently had return of his a flutter.  Additional history: Chart reviewed. Pertinent results include: Extensively reviewed cardiology including previous echo, stress tests.  Normal left ventricular ejection fraction with slightly enlarged atria on most recent echo from 2024.  Physical Exam: Physical exam performed. The pertinent findings include: Initially tachycardic and a flutter, improved to sinus bradycardia on reevaluation.  Lab Tests/Imaging studies: I personally interpreted labs/imaging and the pertinent results include: BMP with mild hyponatremia, sodium 133, CBC with mild leukocytosis, white blood cells 12.4, magnesium normal range.  His initial troponin is mildly elevated at 74, suspect secondary to his tachycardia, but will trend delta to ensure flat..  Plain film chest x-ray without  evidence of acute intrathoracic abnormality.  I agree with the radiologist interpretation.  Cardiac monitoring: EKG obtained and interpreted by myself and attending physician which shows: Initially a flutter, tachycardic rate, improved to sinus bradycardia on reevaluation.   Medications: I ordered medication including 10 mg Cardizem , 1 L fluid bolus for some soft pressures after conversion..  I have reviewed the patients home medicines and have made adjustments as needed.   Consults: Dr. Alroy Aspen reports hold the spironolactone , okay to give some IV fluid in the emergency will plan to follow-up with his cardiologist in the next week, does not think that he will require admission.  2:40 PM Care of Andre Kawasaki transferred to Resident Physician Arminda Landmark and Dr. Salomon Cree at the end of my shift as the patient will require reassessment once labs/imaging have resulted. Patient presentation, ED course, and plan of care discussed with review of all pertinent labs and imaging. Please see his/her note for further details regarding further ED course and disposition. Plan at time of handoff is if troponin is flat stable for discharge, disposition already written. This may be altered or completely changed at the discretion of the oncoming team pending results of further workup.  Final Clinical Impression(s) / ED Diagnoses Final diagnoses:  None    Rx / DC Orders ED Discharge Orders     None         Tavia Stave H, PA-C 01/27/24 1512    Mozell Arias, MD 01/27/24 1524

## 2024-01-27 NOTE — Telephone Encounter (Signed)
 STAT if HR is under 50 or over 120 (normal HR is 60-100 beats per minute)  What is your heart rate? 96/59 hr 138 - currently, hr 43 -earlier this morning   Do you have a log of your heart rate readings (document readings)?   Do you have any other symptoms? States he had afib over the weekend and he is having some dizziness.

## 2024-01-27 NOTE — Consult Note (Signed)
 Cardiology Consultation   Patient ID: Ruben [name] MRN: [id number]; DOB: 1946-06-16  Admit date: 01/27/2024 Date of Consult: 01/27/2024  PCP:  Lanae Pinal, MD   [facility] Providers Cardiologist:  Magnus Schuller, MD   Click here to update MD or APP on Care Team, Refresh:1}     Patient Profile:   Ruben [name] is a 78 y.o. male with a hx of CAD, coronary artery bypass grafting, atrial fibrillation status post ablation, hypertension, hyperlipidemia   who is being seen 01/27/2024 for the evaluation of paroxysmal atrial flutter at the request of Dr. Val Garin.  History of Present Illness:   Mr. Demeo has a long history of coronary artery disease.  He status post CABG in 1997.  He has had stenting in 2009.  He has overall done well.  He typically does not have angina but has shortness of breath as his anginal equivalent. He has had some intermittent episodes of paroxysmal atrial flutter.  He has been started on as needed Cardizem  for this.  He notes that he has been more fatigued doing his normal activities for the past several weeks.   He has been recording his blood pressure every day.  He noted that his blood pressure was high about 2 or 3 days ago.  He failed to recognize the high blood pressure until the nurse called him the next day.  He has been taken Cardizem  several times a day as well as carvedilol  6.25 mg twice daily to help keep his heart rate controlled.  He presented today with persistently high blood pressure.  He received IV Cardizem  and converted back to sinus rhythm.  He feels quite well at this point.  Troponin levels are minimally elevated at 74, 100.   EKG shows sinus rhythm.  He has some T wave inversions in the inferior and anterolateral leads.  These EKG changes have been present on previous EKGs going back for 2 years.    Past Medical History:  Diagnosis Date   Atrial flutter (HCC) 02/02/2017   CAD, multiple vessel    Diabetes mellitus  (HCC)    Dyslipidemia    Hypertension     Past Surgical History:  Procedure Laterality Date   A-FLUTTER ABLATION N/A 02/05/2017   Procedure: A-Flutter Ablation;  Surgeon: Jolly Needle, MD;  Location: [facility] INVASIVE CV LAB;  Service: Cardiovascular;  Laterality: N/A;   CARDIAC CATHETERIZATION  12/13/2011   No intervention - recommend medical therapy with increased medical trial   CARDIOVASCULAR STRESS TEST  10/26/2010   No scintigraphic evidence of inducible myocardial ischemia. No ECG changes. EKG negative for ischemia.   CORONARY ARTERY BYPASS GRAFT  1997   LIMA-LAD, vein graft to diagonal, vein graft to obtuse marginal, and vein graft to RCA   LEFT HEART CATH AND CORS/GRAFTS ANGIOGRAPHY N/A 09/29/2018   Procedure: LEFT HEART CATH AND CORS/GRAFTS ANGIOGRAPHY;  Surgeon: Arleen Lacer, MD;  Location: [facility] INVASIVE CV LAB;  Service: Cardiovascular;  Laterality: N/A;   LEFT HEART CATHETERIZATION WITH CORONARY/GRAFT ANGIOGRAM N/A 12/13/2011   Procedure: LEFT HEART CATHETERIZATION WITH Estella Helling;  Surgeon: Millicent Ally, MD;  Location: [facility] CATH LAB;  Service: Cardiovascular;  Laterality: N/A;   LOWER EXTREMITY ARTERIAL DOPPLER  01/30/2000   Normal arterial study   TEE WITHOUT CARDIOVERSION N/A 02/04/2017   Procedure: TRANSESOPHAGEAL ECHOCARDIOGRAM (TEE);  Surgeon: Liza Riggers, MD;  Location: [facility] ENDOSCOPY;  Service: Cardiovascular;  Laterality: N/A;   TRANSTHORACIC ECHOCARDIOGRAM  12/12/2011   EF >55%,  LA severely dilated.   ULTRASOUND GUIDANCE FOR VASCULAR ACCESS  09/29/2018   Procedure: Ultrasound Guidance For Vascular Access;  Surgeon: Arleen Lacer, MD;  Location: [facility] INVASIVE CV LAB;  Service: Cardiovascular;;     Home Medications:  Prior to Admission medications   Medication Sig Start Date End Date Taking? Authorizing Provider  apixaban  (ELIQUIS ) 5 MG TABS tablet Take 1 tablet (5 mg total) by mouth 2 (two) times daily. 11/22/23   Nathanel Bal, PA-C  carvedilol  (COREG )  3.125 MG tablet Take 1 tablet (3.125 mg total) by mouth in the morning, at noon, and at bedtime. 11/19/23 02/17/24  Nathanel Bal, PA-C  Cholecalciferol (VITAMIN D3) 5000 units CAPS Take 5,000 Units by mouth See admin instructions. Take 5,000 units by mouth once a day and rotate with 10,000 units every other day    [provider]  clopidogrel  (PLAVIX ) 75 MG tablet Take 1 tablet (75 mg total) by mouth daily. 07/18/23   Millicent Ally, MD  Coenzyme Q10 (CO Q-10 PO) Take 2 capsules by mouth daily.    [provider]  diltiazem  (CARDIZEM ) 30 MG tablet Take 1 tablet every 4 hours AS NEEDED for AFIB heart rate >100 as long as top BP >100. 01/24/24   Nathanel Bal, PA-C  FERROUS SULFATE PO Take 1 tablet by mouth 3 (three) times a week.    [provider]  FOLIC ACID  PO Take 1 capsule by mouth every morning.    [provider]  icosapent  Ethyl (VASCEPA ) 1 g capsule Take 2 capsules (2 g total) by mouth 2 (two) times daily. 07/22/23   Millicent Ally, MD  isosorbide  mononitrate (IMDUR ) 60 MG 24 hr tablet TAKE 1 TABLET(60 MG) BY MOUTH DAILY 11/19/23   Millicent Ally, MD  JARDIANCE 10 MG TABS tablet Take 10 mg by mouth daily.    [provider]  losartan  (COZAAR ) 25 MG tablet Take 1 tablet (25 mg total) by mouth daily. 07/18/23   Millicent Ally, MD  MAGNESIUM PO Take 1 capsule by mouth 2 (two) times daily.    [provider]  Multiple Vitamins-Minerals (MULTI FOR HIM) TABS 1 Tablet Orally Once a day    [provider]  nitroGLYCERIN  (NITROLINGUAL ) 0.4 MG/SPRAY spray Place 1 spray under the tongue every 5 (five) minutes x 3 doses as needed for chest pain. 07/18/23   Millicent Ally, MD  NUCYNTA  ER 200 MG TB12 Take 200 mg by mouth 2 (two) times daily. 12/05/17   [provider]  pioglitazone (ACTOS) 45 MG tablet Take 45 mg by mouth daily.    [provider]  pregabalin  (LYRICA ) 200 MG capsule Take 200 mg by mouth 3 (three) times  daily.     [provider]  Quercetin 250 MG TABS Take 500 mg by mouth every morning.    [provider]  ranolazine  (RANEXA ) 1000 MG SR tablet Take 1 tablet (1,000 mg total) by mouth 2 (two) times daily. 07/18/23   Millicent Ally, MD  rosuvastatin  (CRESTOR ) 40 MG tablet Take 1 tablet (40 mg total) by mouth daily. 07/18/23   Millicent Ally, MD  spironolactone  (ALDACTONE ) 25 MG tablet TAKE 1/2 TABLET(12.5 MG) BY MOUTH DAILY 07/18/23   Millicent Ally, MD  TURMERIC CURCUMIN PO Take 1 tablet by mouth every morning. He is taking a liquid form    [provider]  VITAMIN A PO Take 1 capsule by mouth every morning.  [provider]  vitamin E 180 MG (400 UNITS) capsule Take 400 Units by mouth daily.    [provider]    Inpatient Medications: Scheduled Meds:  Continuous Infusions:  PRN Meds:   Allergies:    Allergies  Allergen Reactions   Nortriptyline Other (See Comments)    Groggy at low dose   Cymbalta [Duloxetine Hcl] Diarrhea   Metformin     Other Reaction(s): exacerbated neuropathy symptoms   Niacin  Other (See Comments)    Other Reaction(s): Flushing (ALLERGY/intolerance)   Duloxetine     Other Reaction(s): Abdominal Pain, GI Intolerance    Social History:   Social History   Socioeconomic History   Marital status: Married    Spouse name: Not on file   Number of children: Not on file   Years of education: Not on file   Highest education level: Not on file  Occupational History   Not on file  Tobacco Use   Smoking status: Never   Smokeless tobacco: Never  Vaping Use   Vaping status: Never Used  Substance and Sexual Activity   Alcohol use: Yes    Comment: occas   Drug use: Never   Sexual activity: Yes    Partners: Female    Comment: MARRIED  Other Topics Concern   Not on file  Social History Narrative   Are you right handed or left handed? right   Are you currently employed ? retired   Caffeine 1 soda a day    Do you live at home alone?wife      What type of home do you live in: 1 story or 2 story? two       Social Drivers of Corporate investment banker Strain: Low Risk  (01/21/2024)   Received from [facility], [facility]   Overall Financial Resource Strain (CARDIA)    Difficulty of Paying Living Expenses: Not very hard  Food Insecurity: No Food Insecurity (01/21/2024)   Received from [facility], [facility]   Hunger Vital Sign    Worried About Running Out of Food in the Last Year: Never true    Ran Out of Food in the Last Year: Never true  Transportation Needs: No Transportation Needs (01/21/2024)   Received from [facility], [facility]   PRAPARE - Transportation    Lack of Transportation (Medical): No    Lack of Transportation (Non-Medical): No  Physical Activity: Sufficiently Active (01/21/2024)   Received from [facility], [facility]   Exercise Vital Sign    Days of Exercise per Week: 3 days    Minutes of Exercise per Session: 90 min  Stress: No Stress Concern Present (01/21/2024)   Received from [facility], [facility] of Occupational Health - Occupational Stress Questionnaire    Feeling of Stress : Only a little  Social Connections: Moderately Integrated (01/21/2024)   Received from [facility], [facility]   Social Network    How would you rate your social network (family, work, friends)?: Adequate participation with social networks  Intimate Partner Violence: Not At Risk (01/21/2024)   Received from [facility], [facility]   HITS    Over the last 12 months how often did your partner physically hurt you?: Never    Over the last 12 months how often did your partner insult you or talk down to you?: Never    Over the last 12 months how often did your partner threaten you with physical harm?: Never  Over the last 12 months how often did your partner scream or curse at you?: Never    Family History:    Family History  Problem  Relation Age of Onset   Heart disease Father    Diabetes Father    Hyperlipidemia Sister      ROS:  Please see the history of present illness.   All other ROS reviewed and negative.     Physical Exam/Data:   Vitals:   01/27/24 1500 01/27/24 1530 01/27/24 1600 01/27/24 1630  BP: 93/60 (!) 106/58 118/61 (!) 140/70  Pulse: 63 70 62 65  Resp: 18 13 18 14   Temp:      TempSrc:      SpO2: 100% 97% 98% 100%  Weight:      Height:       No intake or output data in the 24 hours ending 01/27/24 1648    01/27/2024   10:12 AM 11/19/2023    2:26 PM 10/31/2023   12:01 PM  Last 3 Weights  Weight (lbs) 197 lb 204 lb 206 lb 6.4 oz  Weight (kg) 89.359 kg 92.534 kg 93.622 kg     Body mass index is 29.09 kg/m.  General:  Well nourished, well developed, in no acute distress HEENT: normal Neck: no JVD Vascular: No carotid bruits; Distal pulses 2+ bilaterally Cardiac:  normal S1, S2; RRR; no significant murmru  Lungs:  clear to auscultation bilaterally, no wheezing, rhonchi or rales  Abd: soft, nontender, no hepatomegaly  Ext: no edema Musculoskeletal:  No deformities, BUE and BLE strength normal and equal Skin: warm and dry  Neuro:  CNs 2-12 intact, no focal abnormalities noted Psych:  Normal affect   EKG:  The EKG was personally reviewed and demonstrates: Initial EKG revealed Atrial flutter with a ventricular rate of 135. Second EKG reveals sinus bradycardia 56.  He has nonspecific ST and T wave changes in the anterior lateral leads as well as inferior leads.  These T wave inversions have been seen on previous EKGs going back at least 2 years.   Telemetry:  Telemetry was personally reviewed and demonstrates: Normal sinus rhythm.  Relevant CV Studies:   Laboratory Data:  High Sensitivity Troponin:   Recent Labs  Lab 01/27/24 1043 01/27/24 1339  TROPONINIHS 74* 100*     Chemistry Recent Labs  Lab 01/27/24 1010  NA 133*  K 4.2  CL 99  CO2 22  GLUCOSE 112*  BUN 21   CREATININE 0.89  CALCIUM  8.8*  MG 2.1  GFRNONAA >60  ANIONGAP 12    No results for input(s): "PROT", "ALBUMIN", "AST", "ALT", "ALKPHOS", "BILITOT" in the last 168 hours. Lipids No results for input(s): "CHOL", "TRIG", "HDL", "LABVLDL", "LDLCALC", "CHOLHDL" in the last 168 hours.  Hematology Recent Labs  Lab 01/27/24 1010  WBC 12.4*  RBC 4.67  HGB 13.8  HCT 41.0  MCV 87.8  MCH 29.6  MCHC 33.7  RDW 16.8*  PLT 212   Thyroid  No results for input(s): "TSH", "FREET4" in the last 168 hours.  BNPNo results for input(s): "BNP", "PROBNP" in the last 168 hours.  DDimer No results for input(s): "DDIMER" in the last 168 hours.   Radiology/Studies:  DG Chest Portable 1 View Result Date: 01/27/2024 CLINICAL DATA:  Persistent intermittent tachycardia EXAM: PORTABLE CHEST 1 VIEW COMPARISON:  Chest x-ray performed November 10, 2023 FINDINGS: Numerous leads overlie the patient. Postsurgical changes from sternotomy. Minimal elevation right hemidiaphragm. No discrete lobar infiltrate. IMPRESSION: 1. No discrete lobar infiltrate.  2. Postsurgical changes from sternotomy. Electronically Signed   By: Reagan Camera M.D.   On: 01/27/2024 10:40     Assessment and Plan:   1.  Recurrent atrial flutter: He has converted back to sinus rhythm.  I encouraged him to continue to take diltiazem  on an as-needed basis.  I think that it is safe for him to be discharged to home. He has an appointment to see the A-fib clinic in approximately 3 weeks. He is compliant with his A-fib medications including Eliquis , carvedilol , diltiazem    2.  Mild troponin elevation: He was in atrial flutter for at least 3 days.  Troponins are minimally elevated.  He also has some underlying coronary artery disease so these troponin elevations are not all that surprising.  He is not having any angina.  He also is not having any shortness of breath to suggest that he is having ischemia.  He will return to see the A-fib clinic in several  weeks.  I have told him to return to the ER if he has any additional problems that are worrisome for coronary ischemia.  He may ultimately need a heart catheterization.  Stress Myoview  study performed in July, 2022 was unremarkable.   He is stable and may be discharged from the emergency room.  Risk Assessment/Risk Scores:        CHA2DS2-VASc Score = 3  1} This indicates a 3.2% annual risk of stroke. The patient's score is based upon: CHF History: 0 HTN History: 0 Diabetes History: 0 Stroke History: 0 Vascular Disease History: 1 Age Score: 2 Gender Score: 0       For questions or updates, please contact [facility] Please consult [email or web address] for contact info under    Signed, Ahmad Alert, MD  01/27/2024 4:48 PM

## 2024-01-27 NOTE — Discharge Instructions (Addendum)
 Per your cardiologist you can continue taking the Cardizem  as needed for symptomatic control of your rapid heart rate, they would like you to follow-up with your cardiologist within the next week for close recheck.  For the time being given your low blood pressure here today when you are out of your A-flutter rhythm they want you to stop taking the spironolactone  medication.  Please return if you have significant chest pain, shortness of breath, dizziness despite all of the above.  It was a pleasure taking care of you today.

## 2024-01-27 NOTE — ED Triage Notes (Signed)
 Pt c/o intermittent tachycardia (130-140s) and dizziness x4 days.  Denies pain.  Pt reports episodes start w/ exertion. Hx of ablations.

## 2024-01-27 NOTE — Telephone Encounter (Signed)
 Pt called to report that he has been having fluctuating HR and BP since this past Thursday... he is now having HR in the 40's and 130's and he is very lethargic and light headed... he says he doe snot feel well... his BP has been down 90's systolic.. he made the med changes as recommended by the [facility] clinic but he says his HR has been so up and down he is not sure how to mange his meds... I have urged him to go to the ED and have his wife take him since he does not feel well so he can be placed on a monitor and managed clinically.... he agrees and I will send to the Af[facility] clinic to alert them.

## 2024-01-28 DIAGNOSIS — G609 Hereditary and idiopathic neuropathy, unspecified: Secondary | ICD-10-CM | POA: Diagnosis not present

## 2024-01-28 DIAGNOSIS — G894 Chronic pain syndrome: Secondary | ICD-10-CM | POA: Diagnosis not present

## 2024-01-28 NOTE — Telephone Encounter (Signed)
 Patient is returning call.

## 2024-01-28 NOTE — Telephone Encounter (Signed)
 Appt made for later this week for follow up

## 2024-01-30 ENCOUNTER — Encounter (HOSPITAL_COMMUNITY): Payer: Self-pay | Admitting: Internal Medicine

## 2024-01-30 ENCOUNTER — Telehealth (HOSPITAL_COMMUNITY): Payer: Self-pay | Admitting: *Deleted

## 2024-01-30 ENCOUNTER — Ambulatory Visit (HOSPITAL_COMMUNITY)
Admission: RE | Admit: 2024-01-30 | Discharge: 2024-01-30 | Disposition: A | Discharge status: Home / Self Care | Source: Ambulatory Visit | Attending: Internal Medicine | Admitting: Internal Medicine

## 2024-01-30 VITALS — BP 138/68 | HR 58 | Ht 69.0 in | Wt 206.6 lb

## 2024-01-30 DIAGNOSIS — I480 Paroxysmal atrial fibrillation: Secondary | ICD-10-CM | POA: Diagnosis not present

## 2024-01-30 DIAGNOSIS — D6869 Other thrombophilia: Secondary | ICD-10-CM | POA: Diagnosis not present

## 2024-01-30 DIAGNOSIS — I4892 Unspecified atrial flutter: Secondary | ICD-10-CM | POA: Diagnosis not present

## 2024-01-30 MED ORDER — NITROGLYCERIN 0.4 MG/SPRAY TL SOLN: 1.0000 | 10 refills | Status: AC | PRN

## 2024-01-30 MED ORDER — DILTIAZEM HCL ER COATED BEADS 120 MG PO CP24: 120.0000 mg | ORAL_CAPSULE | Freq: Every day | ORAL | 3 refills | Status: DC

## 2024-01-30 NOTE — Telephone Encounter (Addendum)
 Patient needs refill of nitroglycerin  sent to pharmacy on file. Patient of Dr Alroy Aspen

## 2024-01-30 NOTE — Progress Notes (Signed)
 Primary Care Physician: Ruben Pinal, MD Primary Cardiologist: Ruben Schuller, MD Electrophysiologist: previously Dr. Nunzio [name]    Referring Physician: ED   Ruben [name] is a 78 y.o. male with a history of CAD s/p CABG, history of atrial flutter ablation in 2018, and paroxsymal atrial flutter who presents for consultation in the [facility]. Seen by Dr. Loetta [name] on 10/31/23 and coreg  decreased from 6.25 mg BID to 3.125 mg BID due to bradycardia and fatigue. He presented to ED on 2/16 with palpitations found to be in atrial flutter. Patient is on dual antiplatelet therapy. He has a CHADS2VASC score of 3.  On follow up 01/30/24, he is currently in NSR. Recent ED visit on 01/27/24 for atrial flutter with RVR converted to NSR with IV diltiazem . He is taking coreg  3.125 mg TID. He was taking diltiazem  30 mg PRN when he was out of rhythm but noted it did not help that much. No missed doses of Eliquis  5 mg BID.   Today, he denies symptoms of chest pain, shortness of breath, orthopnea, PND, lower extremity edema, dizziness, presyncope, syncope, snoring, daytime somnolence, bleeding, or neurologic sequela. The patient is tolerating medications without difficulties and is otherwise without complaint today.    he has a BMI of Body mass index is 30.51 kg/m.Ruben [name] Filed Weights   01/30/24 1454  Weight: 93.7 kg     Current Outpatient Medications  Medication Sig Dispense Refill   apixaban  (ELIQUIS ) 5 MG TABS tablet Take 1 tablet (5 mg total) by mouth 2 (two) times daily. 180 tablet 2   carvedilol  (COREG ) 3.125 MG tablet Take 1 tablet (3.125 mg total) by mouth in the morning, at noon, and at bedtime. 270 tablet 2   Cholecalciferol (VITAMIN D3) 5000 units CAPS Take 5,000 Units by mouth See admin instructions. Take 5,000 units by mouth once a day and rotate with 10,000 units every other day     clopidogrel  (PLAVIX ) 75 MG tablet Take 1 tablet (75 mg total) by mouth daily. 90 tablet 3    Coenzyme Q10 (CO Q-10 PO) Take 2 capsules by mouth daily.     diltiazem  (CARDIZEM  CD) 120 MG 24 hr capsule Take 1 capsule (120 mg total) by mouth daily. 30 capsule 3   diltiazem  (CARDIZEM ) 30 MG tablet Take 1 tablet every 4 hours AS NEEDED for AFIB heart rate >100 as long as top BP >100. 45 tablet 1   FERROUS SULFATE PO Take 1 tablet by mouth 3 (three) times a week.     FOLIC ACID  PO Take 1 capsule by mouth every morning.     icosapent  Ethyl (VASCEPA ) 1 g capsule Take 2 capsules (2 g total) by mouth 2 (two) times daily. 360 capsule 3   isosorbide  mononitrate (IMDUR ) 60 MG 24 hr tablet TAKE 1 TABLET(60 MG) BY MOUTH DAILY 90 tablet 2   JARDIANCE 10 MG TABS tablet Take 10 mg by mouth daily.     losartan  (COZAAR ) 25 MG tablet Take 1 tablet (25 mg total) by mouth daily. 90 tablet 3   MAGNESIUM PO Take 1 capsule by mouth 2 (two) times daily.     Multiple Vitamins-Minerals (MULTI FOR HIM) TABS 1 Tablet Orally Once a day     NUCYNTA  ER 200 MG TB12 Take 200 mg by mouth 2 (two) times daily.  0   pioglitazone (ACTOS) 45 MG tablet Take 45 mg by mouth daily.     pregabalin  (LYRICA ) 200 MG capsule Take 200 mg  by mouth 3 (three) times daily.      Quercetin 250 MG TABS Take 500 mg by mouth every morning.     ranolazine  (RANEXA ) 1000 MG SR tablet Take 1 tablet (1,000 mg total) by mouth 2 (two) times daily. 180 tablet 1   rosuvastatin  (CRESTOR ) 40 MG tablet Take 1 tablet (40 mg total) by mouth daily. 90 tablet 3   TURMERIC CURCUMIN PO Take 1 tablet by mouth every morning. He is taking a liquid form     VITAMIN A PO Take 1 capsule by mouth every morning.     vitamin E 180 MG (400 UNITS) capsule Take 400 Units by mouth daily.     nitroGLYCERIN  (NITROLINGUAL ) 0.4 MG/SPRAY spray Place 1 spray under the tongue every 5 (five) minutes x 3 doses as needed for chest pain. 12 g 10   spironolactone  (ALDACTONE ) 25 MG tablet TAKE 1/2 TABLET(12.5 MG) BY MOUTH DAILY (Patient not taking: Reported on 01/30/2024) 45 tablet 3   No  current facility-administered medications for this encounter.    Atrial Fibrillation Management history:  Previous antiarrhythmic drugs: none Previous cardioversions: none Previous ablations: 2018 (atrial flutter) Anticoagulation history: Eliquis    ROS- All systems are reviewed and negative except as per the HPI above.  Physical Exam: BP 138/68   Pulse (!) 58   Ht 5\' 9"  (1.753 m)   Wt 93.7 kg   BMI 30.51 kg/m   GEN- The patient is well appearing, alert and oriented x 3 today.   Neck - no JVD or carotid bruit noted Lungs- Clear to ausculation bilaterally, normal work of breathing Heart- Regular rate and rhythm, no murmurs, rubs or gallops, PMI not laterally displaced Extremities- no clubbing, cyanosis, or edema Skin - no rash or ecchymosis noted   EKG today demonstrates  Vent. rate 58 BPM PR interval 286 ms QRS duration 104 ms QT/QTcB 434/426 ms P-R-T axes 65 -23 -9 Sinus bradycardia with 1st degree A-V block Incomplete right bundle branch block Nonspecific T wave abnormality Abnormal ECG When compared with ECG of 27-Jan-2024 10:39, PREVIOUS ECG IS PRESENT  Echo 05/23/23 demonstrated  1. Left ventricular ejection fraction, by estimation, is 60 to 65%. The  left ventricle has normal function. The left ventricle has no regional  wall motion abnormalities. Left ventricular diastolic parameters were  normal. The average left ventricular  global longitudinal strain is -27.1 %. The global longitudinal strain is  normal.   2. Right ventricular systolic function is normal. The right ventricular  size is normal.   3. Left atrial size was mildly dilated.   4. The mitral valve is normal in structure. Trivial mitral valve  regurgitation. No evidence of mitral stenosis.   5. The aortic valve is tricuspid. Aortic valve regurgitation is mild.  Aortic valve sclerosis is present, with no evidence of aortic valve  stenosis.   6. Aortic dilatation noted. There is mild dilatation  of the ascending  aorta, measuring 39 mm.   7. The inferior vena cava is normal in size with greater than 50%  respiratory variability, suggesting right atrial pressure of 3 mmHg.    ASSESSMENT & PLAN CHA2DS2-VASc Score = 3  The patient's score is based upon: CHF History: 0 HTN History: 0 Diabetes History: 0 Stroke History: 0 Vascular Disease History: 1 Age Score: 2 Gender Score: 0       ASSESSMENT AND PLAN: Paroxysmal Atrial Flutter The patient's CHA2DS2-VASc score is 3, indicating a 3.2% annual risk of stroke.    He  is currently in NSR. Recent ED visit on 5/5 for atrial flutter with RVR converted with IV diltiazem . We had a long discussion about medication treatments and ablation in detail. We discussed potential options such as Tikosyn and amiodarone. He would not be a candidate for class 1C drugs nor Multaq due to prolonged PR interval. We talked about the monitoring required for these medications, hospital admission for Tikosyn, and potential adverse effects. We also discussed pulse field ablation as an option in the form of intervention. After discussion, patient is interested in speaking with EP regarding potential for ablation. He does not wish to begin AAD therapy at this time due to potential for adverse effects. Due to this, will begin diltiazem  120 mg daily and have him monitor BP/HR. If he has marked bradycardia would then decrease coreg  to 3.125 mg BID.   PR 291 ms Qtc in NSR 360 ms CrCl 89 mL/min  Secondary Hypercoagulable State (ICD10:  D68.69) The patient is at significant risk for stroke/thromboembolism based upon his CHA2DS2-VASc Score of 3.  Start Apixaban  (Eliquis ).  Continue Eliquis  5 mg BID without interruption.     Will refer to establish with EP (former Dr. Nunzio [name] patient) to discuss ablation.   Minnie Amber, PA-C  [facility] [address] [place], [place] [address] [phone]

## 2024-01-30 NOTE — Patient Instructions (Signed)
 Start cardizem  120mg  once a day -- monitor blood pressure and heart rate - if seeing low heart rates upper 40s-low 50s let us  know [phone]

## 2024-01-30 NOTE — Telephone Encounter (Signed)
 RX sent to requested Pharmacy

## 2024-01-31 ENCOUNTER — Encounter: Payer: Self-pay | Admitting: Neurology

## 2024-01-31 ENCOUNTER — Ambulatory Visit (INDEPENDENT_AMBULATORY_CARE_PROVIDER_SITE_OTHER): Payer: Medicare Other | Admitting: Neurology

## 2024-01-31 VITALS — BP 131/61 | HR 57 | Ht 69.0 in | Wt 205.0 lb

## 2024-01-31 DIAGNOSIS — R2681 Unsteadiness on feet: Secondary | ICD-10-CM | POA: Diagnosis not present

## 2024-01-31 DIAGNOSIS — E1142 Type 2 diabetes mellitus with diabetic polyneuropathy: Secondary | ICD-10-CM

## 2024-01-31 DIAGNOSIS — R5383 Other fatigue: Secondary | ICD-10-CM

## 2024-01-31 NOTE — Patient Instructions (Signed)
 -Continue Lyrica  200 mg three times daily -Continue Alpha lipoic acid 600 mg daily  Return to clinic in 1 year  Please let me know if you have any questions or concerns in the meantime.  The physicians and staff at [facility] are committed to providing excellent care. You may receive a survey requesting feedback about your experience at our office. We strive to receive "very good" responses to the survey questions. If you feel that your experience would prevent you from giving the office a "very good " response, please contact our office to try to remedy the situation. We may be reached at [phone]. Thank you for taking the time out of your busy day to complete the survey.  Rommie Coats, MD [facility]  Preventing Falls at [facility] are common, often dreaded events in the lives of older people. Aside from the obvious injuries and even death that may result, fall can cause wide-ranging consequences including loss of independence, mental decline, decreased activity and mobility. Younger people are also at risk of falling, especially those with chronic illnesses and fatigue.  Ways to reduce risk for falling Examine diet and medications. Warm foods and alcohol dilate blood vessels, which can lead to dizziness when standing. Sleep aids, antidepressants and pain medications can also increase the likelihood of a fall.  Get a vision exam. Poor vision, cataracts and glaucoma increase the chances of falling.  Check foot gear. Shoes should fit snugly and have a sturdy, nonskid sole and a broad, low heel  Participate in a physician-approved exercise program to build and maintain muscle strength and improve balance and coordination. Programs that use ankle weights or stretch bands are excellent for muscle-strengthening. Water aerobics programs and low-impact Tai Chi programs have also been shown to improve balance and coordination.  Increase vitamin D intake. Vitamin D improves muscle  strength and increases the amount of calcium  the body is able to absorb and deposit in bones.  How to prevent falls from common hazards Floors - Remove all loose wires, cords, and throw rugs. Minimize clutter. Make sure rugs are anchored and smooth. Keep furniture in its usual place.  Chairs -- Use chairs with straight backs, armrests and firm seats. Add firm cushions to existing pieces to add height.  Bathroom - Install grab bars and non-skid tape in the tub or shower. Use a bathtub transfer bench or a shower chair with a back support Use an elevated toilet seat and/or safety rails to assist standing from a low surface. Do not use towel racks or bathroom tissue holders to help you stand.  Lighting - Make sure halls, stairways, and entrances are well-lit. Install a night light in your bathroom or hallway. Make sure there is a light switch at the top and bottom of the staircase. Turn lights on if you get up in the middle of the night. Make sure lamps or light switches are within reach of the bed if you have to get up during the night.  Kitchen - Install non-skid rubber mats near the sink and stove. Clean spills immediately. Store frequently used utensils, pots, pans between waist and eye level. This helps prevent reaching and bending. Sit when getting things out of lower cupboards.  Living room/ Bedrooms - Place furniture with wide spaces in between, giving enough room to move around. Establish a route through the living room that gives you something to hold onto as you walk.  Stairs - Make sure treads, rails, and rugs are secure. Install  a rail on both sides of the stairs. If stairs are a threat, it might be helpful to arrange most of your activities on the lower level to reduce the number of times you must climb the stairs.  Entrances and doorways - Install metal handles on the walls adjacent to the doorknobs of all doors to make it more secure as you travel through the doorway.  Tips for  maintaining balance Keep at least one hand free at all times. Try using a backpack or fanny pack to hold things rather than carrying them in your hands. Never carry objects in both hands when walking as this interferes with keeping your balance.  Attempt to swing both arms from front to back while walking. This might require a conscious effort if Parkinson's disease has diminished your movement. It will, however, help you to maintain balance and posture, and reduce fatigue.  Consciously lift your feet off of the ground when walking. Shuffling and dragging of the feet is a common culprit in losing your balance.  When trying to navigate turns, use a "U" technique of facing forward and making a wide turn, rather than pivoting sharply.  Try to stand with your feet shoulder-length apart. When your feet are close together for any length of time, you increase your risk of losing your balance and falling.  Do one thing at a time. Don't try to walk and accomplish another task, such as reading or looking around. The decrease in your automatic reflexes complicates motor function, so the less distraction, the better.  Do not wear rubber or gripping soled shoes, they might "catch" on the floor and cause tripping.  Move slowly when changing positions. Use deliberate, concentrated movements and, if needed, use a grab bar or walking aid. Count 15 seconds between each movement. For example, when rising from a seated position, wait 15 seconds after standing to begin walking.  If balance is a continuous problem, you might want to consider a walking aid such as a cane, walking stick, or walker. Once you've mastered walking with help, you might be ready to try it on your own again.

## 2024-02-13 DIAGNOSIS — I2510 Atherosclerotic heart disease of native coronary artery without angina pectoris: Secondary | ICD-10-CM | POA: Diagnosis not present

## 2024-02-13 DIAGNOSIS — E1142 Type 2 diabetes mellitus with diabetic polyneuropathy: Secondary | ICD-10-CM | POA: Diagnosis not present

## 2024-02-13 DIAGNOSIS — I10 Essential (primary) hypertension: Secondary | ICD-10-CM | POA: Diagnosis not present

## 2024-02-18 ENCOUNTER — Encounter: Payer: Self-pay | Admitting: Cardiovascular Disease

## 2024-02-18 ENCOUNTER — Ambulatory Visit: Attending: Cardiovascular Disease | Admitting: Cardiovascular Disease

## 2024-02-18 VITALS — BP 140/66 | HR 59 | Ht 69.0 in | Wt 200.0 lb

## 2024-02-18 DIAGNOSIS — I480 Paroxysmal atrial fibrillation: Secondary | ICD-10-CM

## 2024-02-18 DIAGNOSIS — I4892 Unspecified atrial flutter: Secondary | ICD-10-CM | POA: Diagnosis not present

## 2024-02-18 NOTE — Patient Instructions (Signed)
 Medication Instructions:  Your physician recommends that you continue on your current medications as directed. Please refer to the Current Medication list given to you today. *If you need a refill on your cardiac medications before your next appointment, please call your pharmacy*  Lab Work: CBC and BMET - please have pre-procedure lab work completed on Monday, June 9 (or at any point that week). This can be done at ANY [facility] near you - no appointment required and this does not have to be fasting. If you have labs (blood work) drawn today and your tests are completely normal, you will receive your results only by: MyChart Message (if you have MyChart) OR A paper copy in the mail If you have any lab test that is abnormal or we need to change your treatment, we will call you to review the results.  Testing/Procedures: Atrial Flutter Ablation - scheduled on Wednesday, July 9. We will be in contact closer to your ablation date with further instructions Your physician has recommended that you have an ablation. Catheter ablation is a medical procedure used to treat some cardiac arrhythmias (irregular heartbeats). During catheter ablation, a long, thin, flexible tube is put into a blood vessel in your groin (upper thigh), or neck. This tube is called an ablation catheter. It is then guided to your heart through the blood vessel. Radio frequency waves destroy small areas of heart tissue where abnormal heartbeats may cause an arrhythmia to start. Please see the instruction sheet given to you today.  Follow-Up: At [facility], you and your health needs are our priority.  As part of our continuing mission to provide you with exceptional heart care, our providers are all part of one team.  This team includes your primary Cardiologist (physician) and Advanced Practice Providers or APPs (Physician Assistants and Nurse Practitioners) who all work together to provide you with the care you need, when you  need it.  Your next appointment:   We will schedule follow up after your ablation  Provider:   Marlane Silver, MD   Cardiac Ablation Cardiac ablation is a procedure to destroy, or ablate, a small amount of heart tissue that is causing problems. The heart has many electrical connections. Sometimes, these connections are abnormal and can cause the heart to beat very fast or irregularly. Ablating the abnormal areas can improve the heart's rhythm or return it to normal. Ablation may be done for people who: Have irregular or rapid heartbeats (arrhythmias). Have Wolff-Parkinson-White syndrome. Have taken medicines for an arrhythmia that did not work or caused side effects. Have a high-risk heartbeat that may be life-threatening. Tell a health care provider about: Any allergies you have. All medicines you are taking, including vitamins, herbs, eye drops, creams, and over-the-counter medicines. Any problems you or family members have had with anesthesia. Any bleeding problems you have. Any surgeries you have had. Any medical conditions you have. Whether you are pregnant or may be pregnant. What are the risks? Your health care provider will talk with you about risks. These may include: Infection. Bruising and bleeding. Stroke or blood clots. Damage to nearby structures or organs. Allergic reaction to medicines or dyes. Needing a pacemaker if the heart gets damaged. A pacemaker is a device that helps the heart beat normally. Failure of the procedure. A repeat procedure may be needed. What happens before the procedure? Medicines Ask your health care provider about: Changing or stopping your regular medicines. These include any heart rhythm medicines, diabetes medicines, or blood thinners  you take. Taking medicines such as aspirin  and ibuprofen. These medicines can thin your blood. Do not take them unless your health care provider tells you to. Taking over-the-counter medicines, vitamins,  herbs, and supplements. General instructions Follow instructions from your health care provider about what you may eat and drink. If you will be going home right after the procedure, plan to have a responsible adult: Take you home from the hospital or clinic. You will not be allowed to drive. Care for you for the time you are told. Ask your health care provider what steps will be taken to prevent infection. What happens during the procedure?  An IV will be inserted into one of your veins. You may be given: A sedative. This helps you relax. Anesthesia. This will: Numb certain areas of your body. An incision will be made in your neck or your groin. A needle will be inserted through the incision and into a large vein in your neck or groin. The small, thin tube (catheter) will be inserted through the needle and moved to your heart. A type of X-ray (fluoroscopy) will be used to help guide the catheter and provide images of the heart on a monitor. Dye may be injected through the catheter to help your surgeon see the area of the heart that needs treatment. Electrical currents will be sent from the catheter to destroy heart tissue in certain areas. There are three types of energy that may be used to do this: Heat (radiofrequency energy). Laser energy. Extreme cold (cryoablation). When the tissue has been destroyed, the catheter will be removed. Pressure will be held on the insertion area to prevent bleeding. A bandage (dressing) will be placed over the insertion area. The procedure may vary among health care providers and hospitals. What happens after the procedure? Your blood pressure, heart rate and rhythm, breathing rate, and blood oxygen level will be monitored until you leave the hospital or clinic. Your insertion area will be checked for bleeding. You will need to lie still for a few hours. If your groin was used, you will need to keep your leg straight for a few hours after the catheter  is removed. This information is not intended to replace advice given to you by your health care provider. Make sure you discuss any questions you have with your health care provider. Document Revised: 02/27/2022 Document Reviewed: 02/27/2022 Elsevier Patient Education  2024 [employer].

## 2024-02-18 NOTE — Progress Notes (Signed)
 Electrophysiology Office Note:    Date:  02/18/2024   ID:  Ruben [name], DOB 1945/11/27, MRN [id number]  PCP:  Lanae Pinal, MD   [facility] Providers Cardiologist:  Magnus Schuller, MD Electrophysiologist:  Efraim Grange, MD     Referring MD: Nathanel Bal, PA-C   History of Present Illness:    Ruben [name] is a 78 y.o. male with a medical history significant for atrial flutter status post ablation in 2018, coronary disease status post CABG and PCI, referred for arrhythmia management.     He underwent ablation of typical atrial flutter by Dr. Nunzio Belch in 2018.  He returned to the ER on Jan 27, 2024 with an atypical appearing atrial flutter.  The flutter wave morphology appeared slightly different on EKG than it did prior to his ablation in 2018.  Discussed the use of AI scribe software for clinical note transcription with the patient, who gave verbal consent to proceed.  History of Present Illness JAYSIAH [name] "[name]" is a 78 year old male with atrial flutter who presents with recurrence of atypical atrial flutter.  He underwent an atrial flutter ablation in 2018. Recently, he experienced a recurrence of atypical atrial flutter, with episodes occurring in mid-February and early May. These episodes were characterized by symptoms consistent with atrial flutter, including an increased heart rate.   He has a history of coronary artery disease, having undergone bypass surgery and PCI.   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          Today, he reports that he feels well. No complaints, no episodes since the last ER visit.  EKGs/Labs/Other Studies Reviewed Today:     Echocardiogram:  TTE August 2024 LV ejection fraction 60 to 65%.  No regional wall motion abnormalities.  Mild left atrial dilation.  Trivial mitral valve regurgitation.  Mild aortic valve  regurgitation.    Stress testing:  Nuclear stress test July 2022 No ST segment deviation during stress.  No evidence of ischemia.  Normal study.  EKG:   EKG Interpretation Date/Time:  Tuesday Feb 18 2024 15:50:45 EDT Ventricular Rate:  59 PR Interval:  286 QRS Duration:  108 QT Interval:  426 QTC Calculation: 421 R Axis:   -53  Text Interpretation: Sinus bradycardia with 1st degree A-V block Left axis deviation Incomplete right bundle branch block When compared with ECG of 30-Jan-2024 14:57, Nonspecific T wave abnormality no longer evident in Anterior leads Confirmed by Marlane Silver [phone]) on 02/18/2024 3:57:23 PM     Physical Exam:    VS:  BP (!) 140/66 (BP Location: Right Arm, Patient Position: Sitting, Cuff Size: Large)   Pulse (!) 59   Ht 5\' 9"  (1.753 m)   Wt 200 lb (90.7 kg)   SpO2 95%   BMI 29.53 kg/m     Wt Readings from Last 3 Encounters:  02/18/24 200 lb (90.7 kg)  01/31/24 205 lb (93 kg)  01/30/24 206 lb 9.6 oz (93.7 kg)     GEN: Well nourished, well developed in no acute distress CARDIAC: RRR, no murmurs, rubs, gallops RESPIRATORY:  Normal work of breathing MUSCULOSKELETAL: no edema    ASSESSMENT & PLAN:     Atrial flutter S/p ablation of typical atrial flutter in 2018 Has again had an atrial flutter; the flutter wave morphology appears slightly different and may be an atypical flutter We discussed management options.  Even as shared decision making approach, we have opted to proceed with ablation  We discussed the indication, rationale, logistics, anticipated benefits, and potential risks of the ablation procedure including but not limited to -- bleed at the groin access site, chest pain, damage to nearby organs such as the diaphragm, lungs, or esophagus, need for a drainage tube, or prolonged hospitalization. I explained that the risk for stroke, heart attack, need for open chest surgery, or even death is very low but not zero. he  expressed  understanding and wishes to proceed.   CAD S/p CABG and PCI Stable, no angina   Signed, Efraim Grange, MD  02/18/2024 4:17 PM    [facility]

## 2024-02-20 ENCOUNTER — Ambulatory Visit (HOSPITAL_COMMUNITY): Payer: Medicare Other | Admitting: Internal Medicine

## 2024-02-22 DIAGNOSIS — I10 Essential (primary) hypertension: Secondary | ICD-10-CM | POA: Diagnosis not present

## 2024-02-22 DIAGNOSIS — E1142 Type 2 diabetes mellitus with diabetic polyneuropathy: Secondary | ICD-10-CM | POA: Diagnosis not present

## 2024-02-22 DIAGNOSIS — I2510 Atherosclerotic heart disease of native coronary artery without angina pectoris: Secondary | ICD-10-CM | POA: Diagnosis not present

## 2024-02-22 DIAGNOSIS — E7800 Pure hypercholesterolemia, unspecified: Secondary | ICD-10-CM | POA: Diagnosis not present

## 2024-03-03 DIAGNOSIS — I4892 Unspecified atrial flutter: Secondary | ICD-10-CM | POA: Diagnosis not present

## 2024-03-03 DIAGNOSIS — I480 Paroxysmal atrial fibrillation: Secondary | ICD-10-CM | POA: Diagnosis not present

## 2024-03-03 LAB — CBC
Hematocrit: 44.1 % (ref 37.5–51.0)
Hemoglobin: 14.1 g/dL (ref 13.0–17.7)
MCH: 29.4 pg (ref 26.6–33.0)
MCHC: 32 g/dL (ref 31.5–35.7)
MCV: 92 fL (ref 79–97)
Platelets: 233 10*3/uL (ref 150–450)
RBC: 4.8 x10E6/uL (ref 4.14–5.80)
RDW: 15.1 % (ref 11.6–15.4)
WBC: 5.9 10*3/uL (ref 3.4–10.8)

## 2024-03-04 LAB — BASIC METABOLIC PANEL WITH GFR
BUN/Creatinine Ratio: 18 (ref 10–24)
BUN: 19 mg/dL (ref 8–27)
CO2: 22 mmol/L (ref 20–29)
Calcium: 9.1 mg/dL (ref 8.6–10.2)
Chloride: 100 mmol/L (ref 96–106)
Creatinine, Ser: 1.04 mg/dL (ref 0.76–1.27)
Glucose: 121 mg/dL — ABNORMAL HIGH (ref 70–99)
Potassium: 5.3 mmol/L — ABNORMAL HIGH (ref 3.5–5.2)
Sodium: 137 mmol/L (ref 134–144)
eGFR: 74 mL/min/{1.73_m2} (ref >59)

## 2024-03-06 ENCOUNTER — Ambulatory Visit: Payer: Self-pay | Admitting: Cardiovascular Disease

## 2024-03-09 NOTE — Telephone Encounter (Signed)
 Spoke with pt and informed him that 6/19 is no longer available. I offered to move his procedure up to 7/3 and he agreed to do that.  He is scheduled on 7/3 at 10:00 AM with Dr. Arlester Ladd.  Labs have been completed.

## 2024-03-09 NOTE — Telephone Encounter (Signed)
 Patient is following up. He says when he reviewed results he was offered a sooner date for ablation and he initially declined. He says he changed his mind and would like to move up ablation if possible. He says he was offered this Thursday, 6/19.

## 2024-03-10 ENCOUNTER — Ambulatory Visit (HOSPITAL_COMMUNITY): Admitting: Physician Assistant

## 2024-03-14 DIAGNOSIS — I2510 Atherosclerotic heart disease of native coronary artery without angina pectoris: Secondary | ICD-10-CM | POA: Diagnosis not present

## 2024-03-14 DIAGNOSIS — I10 Essential (primary) hypertension: Secondary | ICD-10-CM | POA: Diagnosis not present

## 2024-03-14 DIAGNOSIS — E1142 Type 2 diabetes mellitus with diabetic polyneuropathy: Secondary | ICD-10-CM | POA: Diagnosis not present

## 2024-03-19 ENCOUNTER — Telehealth (HOSPITAL_COMMUNITY): Payer: Self-pay

## 2024-03-19 NOTE — Telephone Encounter (Signed)
 Attempted to reach patient to discuss upcoming procedure, no answer. Left VM for patient to return call.

## 2024-03-20 NOTE — Telephone Encounter (Addendum)
 Patient returned call to discuss upcoming procedure.   Labs: completed.   Any recent signs of acute illness or been started on antibiotics? No Any new medications started? No Any medications to hold? Hold Jardiance for 3 days prior to procedure- last dose on June 29. Any missed doses of blood thinner? No Advised patient to continue taking ANTICOAGULANT: Eliquis  (Apixaban ) twice daily without missing any doses.  Medication instructions:  On the morning of your procedure, ONLY take Lyrica  and Nucynta  with a small sip of water as approved by anesthesiologist. Do not take any other medications including Eliquis  or the procedure may be rescheduled. Nothing to eat or drink after midnight prior to your procedure.  Confirmed patient is scheduled for Atrial Flutter Ablationon Thursday, July 3 with Dr. Eulas Furbish. Instructed patient to arrive at the Main Entrance A at [facility]: [address] [place], [place] [address] and check in at Admitting at 8:00 AM.   Advised of plan to go home the same day and will only stay overnight if medically necessary. You MUST have a responsible adult to drive you home and MUST be with you the first 24 hours after you arrive home or your procedure could be cancelled.  Patient verbalized understanding to all instructions provided and agreed to proceed with procedure.

## 2024-03-23 DIAGNOSIS — I10 Essential (primary) hypertension: Secondary | ICD-10-CM | POA: Diagnosis not present

## 2024-03-23 DIAGNOSIS — I2510 Atherosclerotic heart disease of native coronary artery without angina pectoris: Secondary | ICD-10-CM | POA: Diagnosis not present

## 2024-03-23 DIAGNOSIS — E7800 Pure hypercholesterolemia, unspecified: Secondary | ICD-10-CM | POA: Diagnosis not present

## 2024-03-23 DIAGNOSIS — E1142 Type 2 diabetes mellitus with diabetic polyneuropathy: Secondary | ICD-10-CM | POA: Diagnosis not present

## 2024-03-24 DIAGNOSIS — G894 Chronic pain syndrome: Secondary | ICD-10-CM | POA: Diagnosis not present

## 2024-03-24 DIAGNOSIS — G609 Hereditary and idiopathic neuropathy, unspecified: Secondary | ICD-10-CM | POA: Diagnosis not present

## 2024-03-25 NOTE — Pre-Procedure Instructions (Signed)
 Attempted to call patient regarding procedure instructions.  Left voicemail on the following items: Arrival time 0800 Nothing to eat or drink after midnight No meds AM of procedure Responsible person to drive you home and stay with you for 24 hrs  Have you missed any doses of anti-coagulant Eliquis- should be taken twice a day, if you have missed any doses please let us know.  Don't take dose morning of procedure.

## 2024-03-26 ENCOUNTER — Ambulatory Visit (HOSPITAL_COMMUNITY)
Admission: RE | Admit: 2024-03-26 | Discharge: 2024-03-26 | Disposition: A | Discharge status: Home / Self Care | Source: Ambulatory Visit | Attending: Cardiovascular Disease | Admitting: Cardiovascular Disease

## 2024-03-26 ENCOUNTER — Encounter (HOSPITAL_COMMUNITY)
Admission: RE | Disposition: A | Discharge status: Home / Self Care | Payer: Self-pay | Source: Ambulatory Visit | Attending: Cardiovascular Disease

## 2024-03-26 ENCOUNTER — Encounter (HOSPITAL_COMMUNITY): Payer: Self-pay | Admitting: Certified Registered Nurse Anesthetist

## 2024-03-26 ENCOUNTER — Encounter (HOSPITAL_COMMUNITY): Payer: Self-pay | Admitting: Cardiovascular Disease

## 2024-03-26 ENCOUNTER — Other Ambulatory Visit: Payer: Self-pay

## 2024-03-26 DIAGNOSIS — E119 Type 2 diabetes mellitus without complications: Secondary | ICD-10-CM | POA: Diagnosis not present

## 2024-03-26 DIAGNOSIS — I4891 Unspecified atrial fibrillation: Secondary | ICD-10-CM

## 2024-03-26 DIAGNOSIS — Z955 Presence of coronary angioplasty implant and graft: Secondary | ICD-10-CM | POA: Diagnosis not present

## 2024-03-26 DIAGNOSIS — Z7984 Long term (current) use of oral hypoglycemic drugs: Secondary | ICD-10-CM | POA: Diagnosis not present

## 2024-03-26 DIAGNOSIS — I484 Atypical atrial flutter: Secondary | ICD-10-CM | POA: Diagnosis not present

## 2024-03-26 DIAGNOSIS — I2510 Atherosclerotic heart disease of native coronary artery without angina pectoris: Secondary | ICD-10-CM | POA: Diagnosis not present

## 2024-03-26 DIAGNOSIS — Z951 Presence of aortocoronary bypass graft: Secondary | ICD-10-CM | POA: Insufficient documentation

## 2024-03-26 DIAGNOSIS — I4892 Unspecified atrial flutter: Secondary | ICD-10-CM | POA: Diagnosis not present

## 2024-03-26 DIAGNOSIS — I10 Essential (primary) hypertension: Secondary | ICD-10-CM | POA: Diagnosis not present

## 2024-03-26 HISTORY — PX: A-FLUTTER ABLATION: EP1230

## 2024-03-26 LAB — POCT ACTIVATED CLOTTING TIME: Activated Clotting Time: 383 s

## 2024-03-26 LAB — GLUCOSE, CAPILLARY
Glucose-Capillary: 119 mg/dL — ABNORMAL HIGH (ref 70–99)
Glucose-Capillary: 124 mg/dL — ABNORMAL HIGH (ref 70–99)

## 2024-03-26 SURGERY — A-FLUTTER ABLATION
Anesthesia: General

## 2024-03-26 MED ORDER — PHENYLEPHRINE HCL-NACL 20-0.9 MG/250ML-% IV SOLN
INTRAVENOUS | Status: DC | PRN
  Administered 2024-03-26: 20 ug/min via INTRAVENOUS

## 2024-03-26 MED ORDER — SODIUM CHLORIDE 0.9% FLUSH: 3.0000 mL | Freq: Two times a day (BID) | INTRAVENOUS | Status: DC

## 2024-03-26 MED ORDER — SUGAMMADEX SODIUM 200 MG/2ML IV SOLN
INTRAVENOUS | Status: DC | PRN
  Administered 2024-03-26: 181.4 mg via INTRAVENOUS

## 2024-03-26 MED ORDER — ACETAMINOPHEN 325 MG PO TABS: 650.0000 mg | ORAL_TABLET | ORAL | Status: DC | PRN

## 2024-03-26 MED ORDER — SODIUM CHLORIDE 0.9 % IV SOLN: 250.0000 mL | INTRAVENOUS | Status: DC | PRN

## 2024-03-26 MED ORDER — ATROPINE SULFATE 1 MG/10ML IJ SOSY
PREFILLED_SYRINGE | INTRAMUSCULAR | Status: AC
  Filled 2024-03-26: qty 10

## 2024-03-26 MED ORDER — FENTANYL CITRATE (PF) 100 MCG/2ML IJ SOLN
INTRAMUSCULAR | Status: DC | PRN
  Administered 2024-03-26: 100 ug via INTRAVENOUS

## 2024-03-26 MED ORDER — HEPARIN SODIUM (PORCINE) 1000 UNIT/ML IJ SOLN
INTRAMUSCULAR | Status: DC | PRN
  Administered 2024-03-26: 16000 [IU] via INTRAVENOUS

## 2024-03-26 MED ORDER — SODIUM CHLORIDE 0.9 % IV SOLN: INTRAVENOUS | Status: DC

## 2024-03-26 MED ORDER — ROCURONIUM BROMIDE 100 MG/10ML IV SOLN
INTRAVENOUS | Status: DC | PRN
Start: 2024-03-26 — End: 2024-03-26
  Administered 2024-03-26: 40 mg via INTRAVENOUS
  Administered 2024-03-26: 20 mg via INTRAVENOUS

## 2024-03-26 MED ORDER — PROTAMINE SULFATE 10 MG/ML IV SOLN
INTRAVENOUS | Status: DC | PRN
  Administered 2024-03-26: 50 mg via INTRAVENOUS

## 2024-03-26 MED ORDER — PROPOFOL 500 MG/50ML IV EMUL
INTRAVENOUS | Status: DC | PRN
  Administered 2024-03-26: 120 mg via INTRAVENOUS

## 2024-03-26 MED ORDER — HEPARIN (PORCINE) IN NACL 1000-0.9 UT/500ML-% IV SOLN
INTRAVENOUS | Status: DC | PRN
  Administered 2024-03-26 (×3): 500 mL

## 2024-03-26 MED ORDER — ATROPINE SULFATE 1 MG/10ML IJ SOSY
PREFILLED_SYRINGE | INTRAMUSCULAR | Status: DC | PRN
  Administered 2024-03-26: 1 mg via INTRAVENOUS

## 2024-03-26 MED ORDER — ONDANSETRON HCL 4 MG/2ML IJ SOLN
INTRAMUSCULAR | Status: DC | PRN
  Administered 2024-03-26: 4 mg via INTRAVENOUS

## 2024-03-26 MED ORDER — ONDANSETRON HCL 4 MG/2ML IJ SOLN: 4.0000 mg | Freq: Four times a day (QID) | INTRAMUSCULAR | Status: DC | PRN

## 2024-03-26 MED ORDER — LIDOCAINE 2% (20 MG/ML) 5 ML SYRINGE
INTRAMUSCULAR | Status: DC | PRN
  Administered 2024-03-26: 100 mg via INTRAVENOUS

## 2024-03-26 MED ORDER — SODIUM CHLORIDE 0.9% FLUSH
3.0000 mL | INTRAVENOUS | Status: DC | PRN
Start: 2024-03-26 — End: 2024-03-26

## 2024-03-26 MED ORDER — FENTANYL CITRATE (PF) 100 MCG/2ML IJ SOLN
INTRAMUSCULAR | Status: AC
  Filled 2024-03-26: qty 2

## 2024-03-26 SURGICAL SUPPLY — 18 items
CABLE FARASTAR GEN2 SNGL USE (CABLE) IMPLANT
CATH DUODECA HALO/ISMUS 7FR (CATHETERS) IMPLANT
CATH FARAWAVE 2.0 31 (CATHETERS) IMPLANT
CATH GE 8FR SOUNDSTAR (CATHETERS) IMPLANT
CATH OCTARAY 2.0 F 3-3-3-3-3 (CATHETERS) IMPLANT
CATH WEBSTER BI DIR CS D-F CRV (CATHETERS) IMPLANT
CLOSURE PERCLOSE PROSTYLE (VASCULAR PRODUCTS) IMPLANT
DEVICE CLOSURE MYNXGRIP 6/7F (Vascular Products) IMPLANT
DILATOR VESSEL 38 20CM 16FR (INTRODUCER) IMPLANT
GUIDEWIRE INQWIRE 1.5J.035X260 (WIRE) IMPLANT
KIT VERSACROSS CNCT FARADRIVE (KITS) IMPLANT
PACK EP LF (CUSTOM PROCEDURE TRAY) ×1 IMPLANT
PAD DEFIB RADIO PHYSIO CONN (PAD) ×1 IMPLANT
PATCH CARTO3 (PAD) IMPLANT
SHEATH FARADRIVE STEERABLE (SHEATH) IMPLANT
SHEATH PINNACLE 8F 10CM (SHEATH) IMPLANT
SHEATH PINNACLE 9F 10CM (SHEATH) IMPLANT
SHEATH PROBE COVER 6X72 (BAG) IMPLANT

## 2024-03-26 NOTE — Transfer of Care (Signed)
 Immediate Anesthesia Transfer of Care Note  Patient: Ruben [name]  Procedure(s) Performed: A-FLUTTER ABLATION  Patient Location: Cath Lab  Anesthesia Type:General  Level of Consciousness: awake, alert , and oriented  Airway & Oxygen Therapy: Patient Spontanous Breathing  Post-op Assessment: Report given to RN, Post -op Vital signs reviewed and stable, Patient moving all extremities X 4, and Patient able to stick tongue midline  Post vital signs: Reviewed and stable  Last Vitals:  Vitals Value Taken Time  BP 132/66 03/26/24 12:07  Temp 98.0   Pulse 63 03/26/24 12:08  Resp 16 03/26/24 12:08  SpO2 94 % 03/26/24 12:08  Vitals shown include unfiled device data.  Last Pain:  Vitals:   03/26/24 0851  TempSrc: Oral  PainSc:          Complications: No notable events documented.

## 2024-03-26 NOTE — Discharge Instructions (Signed)

## 2024-03-26 NOTE — Anesthesia Postprocedure Evaluation (Signed)
 Anesthesia Post Note  Patient: Ruben [name]  Procedure(s) Performed: A-FLUTTER ABLATION     Patient location during evaluation: PACU Anesthesia Type: General Level of consciousness: awake and alert Pain management: pain level controlled Vital Signs Assessment: post-procedure vital signs reviewed and stable Respiratory status: spontaneous breathing, nonlabored ventilation and respiratory function stable Cardiovascular status: blood pressure returned to baseline and stable Postop Assessment: no apparent nausea or vomiting Anesthetic complications: no   No notable events documented.  Last Vitals:  Vitals:   03/26/24 1236 03/26/24 1237  BP:  125/61  Pulse:  (!) 55  Resp:  12  Temp: 36.6 C   SpO2:  97%    Last Pain:  Vitals:   03/26/24 1236  TempSrc: Oral  PainSc: 0-No pain   Pain Goal:                   Butler Levander Pinal

## 2024-03-26 NOTE — Anesthesia Preprocedure Evaluation (Addendum)
 Anesthesia Evaluation  Patient identified by MRN, date of birth, ID band Patient awake    Reviewed: Allergy & Precautions, NPO status , Patient's Chart, lab work & pertinent test results, reviewed documented beta blocker date and time   Airway Mallampati: III  TM Distance: >3 FB Neck ROM: Limited    Dental  (+) Teeth Intact   Pulmonary    breath sounds clear to auscultation       Cardiovascular hypertension, Pt. on medications and Pt. on home beta blockers + CAD and + CABG  + dysrhythmias Atrial Fibrillation  Rhythm:Irregular Rate:Normal     Neuro/Psych    GI/Hepatic   Endo/Other  diabetes, Well Controlled, Type 2, Oral Hypoglycemic Agents    Renal/GU      Musculoskeletal   Abdominal   Peds  Hematology   Anesthesia Other Findings   Reproductive/Obstetrics                              Anesthesia Physical Anesthesia Plan  ASA: 3  Anesthesia Plan: General   Post-op Pain Management:    Induction: Intravenous  PONV Risk Score and Plan: 2 and Ondansetron  and Dexamethasone  Airway Management Planned: Nasal Cannula  Additional Equipment: None  Intra-op Plan:   Post-operative Plan: Extubation in OR  Informed Consent: I have reviewed the patients History and Physical, chart, labs and discussed the procedure including the risks, benefits and alternatives for the proposed anesthesia with the patient or authorized representative who has indicated his/her understanding and acceptance.     Dental advisory given  Plan Discussed with: CRNA, Anesthesiologist and Surgeon  Anesthesia Plan Comments:          Anesthesia Quick Evaluation

## 2024-03-26 NOTE — H&P (Signed)
 Electrophysiology Office Note:    Date:  03/26/2024   ID:  Ruben [name], DOB [birth year], MRN [id number]  PCP:  Regino Slater, MD   [facility] Providers Cardiologist:  Debby Sor, MD Electrophysiologist:  Eulas FORBES Furbish, MD     Referring MD: No ref. provider found   History of Present Illness:    Ruben [name] is a 78 y.o. male with a medical history significant for atrial flutter status post ablation in 2018, coronary disease status post CABG and PCI, referred for arrhythmia management.     He underwent ablation of typical atrial flutter by Dr. Kelsie in 2018.  He returned to the ER on Jan 27, 2024 with an atypical appearing atrial flutter.  The flutter wave morphology appeared slightly different on EKG than it did prior to his ablation in 2018.  Discussed the use of AI scribe software for clinical note transcription with the patient, who gave verbal consent to proceed.  History of Present Illness Ruben [name] is a 78 year old male with atrial flutter who presents with recurrence of atypical atrial flutter.  He underwent an atrial flutter ablation in 2018. Recently, he experienced a recurrence of atypical atrial flutter, with episodes occurring in mid-February and early May. These episodes were characterized by symptoms consistent with atrial flutter, including an increased heart rate.   He has a history of coronary artery disease, having undergone bypass surgery and PCI.   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          Today, he reports that he feels well. No complaints, no episodes since the last ER visit.  he  has not missed any doses of anticoagulation, and he took his dose last night. There have been no changes in the patient's diagnoses, medications, or condition since our recent clinic visit.   EKGs/Labs/Other Studies Reviewed Today:      Echocardiogram:  TTE August 2024 LV ejection fraction 60 to 65%.  No regional wall motion abnormalities.  Mild left atrial dilation.  Trivial mitral valve regurgitation.  Mild aortic valve regurgitation.    Stress testing:  Nuclear stress test July 2022 No ST segment deviation during stress.  No evidence of ischemia.  Normal study.  EKG:         Physical Exam:    VS:  BP (!) 151/68   Pulse (!) 55   Temp 97.7 F (36.5 C) (Oral)   Resp 17   Ht 5' 9 (1.753 m)   Wt 90.7 kg   SpO2 98%   BMI 29.53 kg/m     Wt Readings from Last 3 Encounters:  03/26/24 90.7 kg  02/18/24 90.7 kg  01/31/24 93 kg     GEN: Well nourished, well developed in no acute distress CARDIAC: RRR, no murmurs, rubs, gallops RESPIRATORY:  Normal work of breathing MUSCULOSKELETAL: no edema    ASSESSMENT & PLAN:     Atrial flutter S/p ablation of typical atrial flutter in 2018 Has again had an atrial flutter; the flutter wave morphology appears slightly different and may be an atypical flutter We discussed management options.  Even as shared decision making approach, we have opted to proceed with ablation  We discussed the indication, rationale, logistics, anticipated benefits, and potential risks of the ablation procedure including but not limited to -- bleed at the groin access site, chest pain, damage to nearby organs such as  the diaphragm, lungs, or esophagus, need for a drainage tube, or prolonged hospitalization. I explained that the risk for stroke, heart attack, need for open chest surgery, or even death is very low but not zero. he  expressed understanding and wishes to proceed.   CAD S/p CABG and PCI Stable, no angina   Signed, Eulas FORBES Furbish, MD  03/26/2024 9:46 AM    [facility]

## 2024-03-26 NOTE — Anesthesia Procedure Notes (Signed)
 Procedure Name: Intubation Date/Time: 03/26/2024 10:17 AM  Performed by: Harrold Macintosh, CRNAPre-anesthesia Checklist: Patient identified, Emergency Drugs available, Suction available and Patient being monitored Patient Re-evaluated:Patient Re-evaluated prior to induction Oxygen Delivery Method: Circle system utilized Preoxygenation: Pre-oxygenation with 100% oxygen Induction Type: IV induction Ventilation: Mask ventilation without difficulty Laryngoscope Size: Miller and 2 Grade View: Grade II Tube type: Oral Tube size: 7.5 mm Number of attempts: 1 Airway Equipment and Method: Stylet and Bite block Placement Confirmation: ETT inserted through vocal cords under direct vision, positive ETCO2 and breath sounds checked- equal and bilateral Secured at: 23 cm Tube secured with: Tape Dental Injury: Teeth and Oropharynx as per pre-operative assessment

## 2024-03-28 ENCOUNTER — Encounter (HOSPITAL_COMMUNITY): Payer: Self-pay | Admitting: Cardiovascular Disease

## 2024-03-30 ENCOUNTER — Telehealth (HOSPITAL_COMMUNITY): Payer: Self-pay

## 2024-03-30 NOTE — Telephone Encounter (Signed)
 Attempted to reach patient to follow up with procedure completed on 03/26/24, no answer. Left VM for patient to return call.

## 2024-03-31 NOTE — Telephone Encounter (Signed)
 Patient returned call to complete post procedure follow up call.  Patient reports no complications with groin sites.   Instructions reviewed with patient:  It is normal to have bruising, tenderness, mild swelling, and a pea or marble sized lump/knot at the groin site which can take up to three months to resolve.  Get help right away if you notice sudden swelling at the puncture site.  Check your puncture site every day for signs of infection: fever, redness, swelling, pus drainage, warmth, foul odor or excessive pain. If this occurs, please call the office at [phone], to speak with the nurse. Get help right away if your puncture site is bleeding and the bleeding does not stop after applying firm pressure to the area.  You may continue to have skipped beats/ atrial fibrillation during the first several months after your procedure.  It is very important not to miss any doses of your blood thinner Eliquis .    You will follow up with Dr.Augustus Mealor on 05/12/24.   Patient verbalized understanding to all instructions provided.

## 2024-04-13 DIAGNOSIS — I2510 Atherosclerotic heart disease of native coronary artery without angina pectoris: Secondary | ICD-10-CM | POA: Diagnosis not present

## 2024-04-13 DIAGNOSIS — E1142 Type 2 diabetes mellitus with diabetic polyneuropathy: Secondary | ICD-10-CM | POA: Diagnosis not present

## 2024-04-13 DIAGNOSIS — I10 Essential (primary) hypertension: Secondary | ICD-10-CM | POA: Diagnosis not present

## 2024-04-22 ENCOUNTER — Encounter (HOSPITAL_COMMUNITY): Payer: Self-pay

## 2024-04-22 ENCOUNTER — Ambulatory Visit (HOSPITAL_COMMUNITY)
Admission: EM | Admit: 2024-04-22 | Discharge: 2024-04-22 | Disposition: A | Discharge status: Home / Self Care | Attending: Emergency Medicine | Admitting: Emergency Medicine

## 2024-04-22 DIAGNOSIS — U071 COVID-19: Secondary | ICD-10-CM | POA: Diagnosis not present

## 2024-04-22 MED ORDER — BENZONATATE 200 MG PO CAPS: 200.0000 mg | ORAL_CAPSULE | Freq: Three times a day (TID) | ORAL | 0 refills | Status: DC

## 2024-04-22 MED ORDER — MOLNUPIRAVIR EUA 200MG CAPSULE
4.0000 | ORAL_CAPSULE | Freq: Two times a day (BID) | ORAL | 0 refills | Status: AC
  Filled 2024-04-23 (×3): qty 40, 5d supply, fill #0

## 2024-04-22 NOTE — ED Triage Notes (Addendum)
 Pt present with c/o worsening sore throat, weakness and is feeling foggy x last night. Pt has a dry cough. Reports having some chills and sweats last night.   Home interventions: cough drops, nothing OTC.  States he has tested positive at home for COVID. Pt states he is vaccinated.

## 2024-04-22 NOTE — ED Provider Notes (Signed)
 [facility]    CSN: [id number] Arrival date & time: 04/22/24  0801      History   Chief Complaint Chief Complaint  Patient presents with   Sore Throat   Weakness   Cough    HPI Ruben [name] is a 78 y.o. male.   Patient presents to clinic with his significant other over concerns of testing positive for Covid-19 infection last night. He had decided to home test after feeling sore throat, generalized weakness, cough, chills and sweating last night. ST got worse overnight. Reports mild shortness of breath. Denies chest pain, no fevers at home.   Has not taken any medications / tried any interventions for symptoms. He is interested in an antiviral. UTD on Covid-19 vaccination, last dose in March of this year.   Hx A-flutter, ablation earlier this month. On eliquis , Plavix  and rosuvastatin  among other medications.   The history is provided by the patient and medical records.  Sore Throat  Weakness Cough   Past Medical History:  Diagnosis Date   Atrial flutter (HCC) 02/02/2017   CAD, multiple vessel    Diabetes mellitus (HCC)    Dyslipidemia    Hypertension     Patient Active Problem List   Diagnosis Date Noted   Hypercoagulable state due to paroxysmal atrial fibrillation (HCC) 11/19/2023   Unstable angina (HCC) 09/27/2018   Atrial flutter (HCC) 02/02/2017   CAD (coronary artery disease) 07/13/2013   Mixed hyperlipidemia 07/13/2013   DM2 (diabetes mellitus, type 2) (HCC) 07/13/2013   Peripheral neuropathy 07/13/2013    Past Surgical History:  Procedure Laterality Date   A-FLUTTER ABLATION N/A 02/05/2017   Procedure: A-Flutter Ablation;  Surgeon: Kelsie Agent, MD;  [facility];  Service: Cardiovascular;  Laterality: N/A;   A-FLUTTER ABLATION N/A 03/26/2024   Procedure: A-FLUTTER ABLATION;  Surgeon: Nancey Eulas BRAVO, MD;  Location: [facility];  Service: Cardiovascular;  Laterality: N/A;   CARDIAC CATHETERIZATION  12/13/2011    No intervention - recommend medical therapy with increased medical trial   CARDIOVASCULAR STRESS TEST  10/26/2010   No scintigraphic evidence of inducible myocardial ischemia. No ECG changes. EKG negative for ischemia.   CORONARY ARTERY BYPASS GRAFT  1997   LIMA-LAD, vein graft to diagonal, vein graft to obtuse marginal, and vein graft to RCA   LEFT HEART CATH AND CORS/GRAFTS ANGIOGRAPHY N/A 09/29/2018   Procedure: LEFT HEART CATH AND CORS/GRAFTS ANGIOGRAPHY;  Surgeon: Anner Alm ORN, MD;  Location: [facility] INVASIVE CV LAB;  Service: Cardiovascular;  Laterality: N/A;   LEFT HEART CATHETERIZATION WITH CORONARY/GRAFT ANGIOGRAM N/A 12/13/2011   Procedure: LEFT HEART CATHETERIZATION WITH EL BILE;  Surgeon: Debby DELENA Sor, MD;  Location: [facility] C[facility];  Service: Cardiovascular;  Laterality: N/A;   LOWER EXTREMITY ARTERIAL DOPPLER  01/30/2000   Normal arterial study   TEE WITHOUT CARDIOVERSION N/A 02/04/2017   Procedure: TRANSESOPHAGEAL ECHOCARDIOGRAM (TEE);  Surgeon: Maranda Leim DEL, MD;  Location: [facility] ENDOSCOPY;  Service: Cardiovascular;  Laterality: N/A;   TRANSTHORACIC ECHOCARDIOGRAM  12/12/2011   EF >55%, LA severely dilated.   ULTRASOUND GUIDANCE FOR VASCULAR ACCESS  09/29/2018   Procedure: Ultrasound Guidance For Vascular Access;  Surgeon: Anner Alm ORN, MD;  Location: [facility] INVASIVE CV LAB;  Service: Cardiovascular;;       Home Medications    Prior to Admission medications   Medication Sig Start Date End Date Taking? Authorizing Provider  benzonatate  (TESSALON ) 200 MG capsule Take 1 capsule (200 mg total) by mouth  every 8 (eight) hours. 04/22/24  Yes Ludie Pavlik  N, FNP  apixaban  (ELIQUIS ) 5 MG TABS tablet Take 1 tablet (5 mg total) by mouth 2 (two) times daily. 11/22/23   Terra Fairy PARAS, PA-C  carvedilol  (COREG ) 3.125 MG tablet Take 1 tablet (3.125 mg total) by mouth in the morning, at noon, and at bedtime. Patient taking differently: Take 3.125 mg by mouth 2 (two) times  daily. May take a third 3.125 mg dose as needed for rapid heart rate 11/19/23 03/26/24  Terra Fairy PARAS, PA-C  Cholecalciferol (VITAMIN D3) 50 MCG (2000 UT) capsule Take 2,000 Units by mouth daily.    [provider]  clopidogrel  (PLAVIX ) 75 MG tablet Take 1 tablet (75 mg total) by mouth daily. 07/18/23   Burnard Debby LABOR, MD  diltiazem  (CARDIZEM ) 30 MG tablet Take 1 tablet every 4 hours AS NEEDED for AFIB heart rate >100 as long as top BP >100. 01/24/24   Terra Fairy PARAS, PA-C  icosapent  Ethyl (VASCEPA ) 1 g capsule Take 2 capsules (2 g total) by mouth 2 (two) times daily. 07/22/23   Burnard Debby LABOR, MD  IRON-VITAMIN C PO Take 1 tablet by mouth every Monday, Wednesday, and Friday.    [provider]  isosorbide  mononitrate (IMDUR ) 60 MG 24 hr tablet TAKE 1 TABLET(60 MG) BY MOUTH DAILY 11/19/23   Burnard Debby LABOR, MD  JARDIANCE 10 MG TABS tablet Take 10 mg by mouth daily.    [provider]  losartan  (COZAAR ) 25 MG tablet Take 1 tablet (25 mg total) by mouth daily. 07/18/23   Burnard Debby LABOR, MD  MAGNESIUM PO Take 120 mg by mouth 2 (two) times daily.    [provider]  molnupiravir  EUA (LAGEVRIO ) 200 mg CAPS capsule Take 4 capsules (800 mg total) by mouth 2 (two) times daily for 5 days. 04/22/24 04/27/24 Yes Korrine Sicard  N, FNP  Multiple Vitamin (MULTIVITAMIN WITH MINERALS) TABS tablet Take 1 tablet by mouth daily.    [provider]  nitroGLYCERIN  (NITROLINGUAL ) 0.4 MG/SPRAY spray Place 1 spray under the tongue every 5 (five) minutes x 3 doses as needed for chest pain. 01/30/24   Burnard Debby LABOR, MD  NUCYNTA  ER 200 MG TB12 Take 200 mg by mouth 2 (two) times daily. 12/05/17   [provider]  OVER THE COUNTER MEDICATION Take 1 tablet by mouth daily. NUCLEZYME-FORTE supplement    [provider]  OVER THE COUNTER MEDICATION Take 1 capsule by mouth 2 (two) times daily. COCOAVIA supplement    [provider]  pioglitazone (ACTOS) 45 MG  tablet Take 45 mg by mouth daily.    [provider]  pregabalin  (LYRICA ) 200 MG capsule Take 200 mg by mouth 3 (three) times daily.     [provider]  Probiotic Product (PROBIOTIC PO) Take 1 capsule by mouth daily.    [provider]  Quercetin 500 MG CAPS Take 500 mg by mouth daily.    [provider]  ranolazine  (RANEXA ) 1000 MG SR tablet Take 1 tablet (1,000 mg total) by mouth 2 (two) times daily. 07/18/23   Burnard Debby LABOR, MD  rosuvastatin  (CRESTOR ) 40 MG tablet Take 1 tablet (40 mg total) by mouth daily. 07/18/23   Burnard Debby LABOR, MD  spironolactone  (ALDACTONE ) 25 MG tablet TAKE 1/2 TABLET(12.5 MG) BY MOUTH DAILY 07/18/23   Burnard Debby LABOR, MD  TURMERIC PO Take 5 mLs by mouth daily. Liquid supplement    [provider]  Vitamin E  268 MG (400 UNIT) CAPS Take 268 mg by mouth daily.    [provider]  Vitamins-Lipotropics (LIPOFLAVONOID PO) Take 100 mg by mouth 3 (three) times daily.    [provider]    Family History Family History  Problem Relation Age of Onset   Heart disease Father    Diabetes Father    Hyperlipidemia Sister     Social History Social History   Tobacco Use   Smoking status: Never   Smokeless tobacco: Never   Tobacco comments:    Never smoked 01/30/24  Vaping Use   Vaping status: Never Used  Substance Use Topics   Alcohol use: Yes    Comment: occas   Drug use: Never     Allergies   Nortriptyline, Cymbalta [duloxetine hcl], and Niacin    Review of Systems Review of Systems  Per HPI  Physical Exam Triage Vital Signs ED Triage Vitals  Encounter Vitals Group     BP 04/22/24 0832 116/71     Girls Systolic BP Percentile --      Girls Diastolic BP Percentile --      Boys Systolic BP Percentile --      Boys Diastolic BP Percentile --      Pulse Rate 04/22/24 0832 65     Resp 04/22/24 0832 15     Temp 04/22/24 0838 98.8 F (37.1 C)     Temp Source 04/22/24 0832 Oral     SpO2  04/22/24 0832 93 %     Weight --      Height --      Head Circumference --      Peak Flow --      Pain Score 04/22/24 0830 3     Pain Loc --      Pain Education --      Exclude from Growth Chart --    No data found.  Updated Vital Signs BP 116/71 (BP Location: Right Arm)   Pulse 65   Temp 98.8 F (37.1 C)   Resp 15   SpO2 93%   Visual Acuity Right Eye Distance:   Left Eye Distance:   Bilateral Distance:    Right Eye Near:   Left Eye Near:    Bilateral Near:     Physical Exam Vitals and nursing note reviewed.  Constitutional:      Appearance: Normal appearance.  HENT:     Head: Normocephalic and atraumatic.     Right Ear: External ear normal.     Left Ear: External ear normal.     Nose: Congestion and rhinorrhea present.     Mouth/Throat:     Mouth: Mucous membranes are moist.     Pharynx: Posterior oropharyngeal erythema present.  Eyes:     Conjunctiva/sclera: Conjunctivae normal.  Cardiovascular:     Rate and Rhythm: Normal rate and regular rhythm.     Heart sounds: Normal heart sounds. No murmur heard. Pulmonary:     Effort: Pulmonary effort is normal. No respiratory distress.     Breath sounds: No wheezing.  Skin:    General: Skin is warm and dry.  Neurological:     General: No focal deficit present.     Mental Status: He is alert and oriented to person, place, and time.  Psychiatric:        Mood and Affect: Mood normal.        Behavior: Behavior normal.      UC Treatments / Results  Labs (all labs ordered  are listed, but only abnormal results are displayed) Labs Reviewed - No data to display  EKG   Radiology No results found.  Procedures Procedures (including critical care time)  Medications Ordered in UC Medications - No data to display  Initial Impression / Assessment and Plan / UC Course  I have reviewed the triage vital signs and the nursing notes.  Pertinent labs & imaging results that were available during my care of the  patient were reviewed by me and considered in my medical decision making (see chart for details).  Vitals and triage reviewed, patient is hemodynamically stable. Lungs vesicular, heart w/ RRR. Oxygen 96% RA on re-check. Congestion, rhinorrhea and PND present. Stable appearance, able to speak in full sentences, NAD.   Paxlovid contraindicated with Ranolazine  and should not be administered with apixaban , clopidogrel , diltiazem , and rosuvastatin . Will start on Molnupivir to help reduce risk of complications and hospitalization. Cough and viral illness management discussed.   POC, f/u care and return precautions given, no questions at this time.      Final Clinical Impressions(s) / UC Diagnoses   Final diagnoses:  COVID-19 virus infection     Discharge Instructions      Take the molnupiravir  twice daily for 5 days to help prevent complications from Covid-19 infection. Ensure you are staying well hydrated with at least 64 ounces of water daily and getting plenty of rest. Tessalon  as needed for cough. Warm saline gargles, tea with honey, cough drops and sleeping with a humidifier can help your sore throat. Tylenol  as needed for fever or chills. Ensure you are fever-free for 24 hours to no longer be considered contagious.   Return to clinic or seek follow-up care for new or concerning symptoms.     ED Prescriptions     Medication Sig Dispense Auth. Provider   molnupiravir  EUA (LAGEVRIO ) 200 mg CAPS capsule Take 4 capsules (800 mg total) by mouth 2 (two) times daily for 5 days. 40 capsule Dreama, Savana Spina  N, FNP   benzonatate  (TESSALON ) 200 MG capsule Take 1 capsule (200 mg total) by mouth every 8 (eight) hours. 30 capsule Dreama, Hosanna Betley  N, FNP      PDMP not reviewed this encounter.   Dreama, Rayhaan Huster  N, [place] 04/22/24 [id number]

## 2024-04-22 NOTE — Discharge Instructions (Signed)
 Take the molnupiravir  twice daily for 5 days to help prevent complications from Covid-19 infection. Ensure you are staying well hydrated with at least 64 ounces of water daily and getting plenty of rest. Tessalon  as needed for cough. Warm saline gargles, tea with honey, cough drops and sleeping with a humidifier can help your sore throat. Tylenol  as needed for fever or chills. Ensure you are fever-free for 24 hours to no longer be considered contagious.   Return to clinic or seek follow-up care for new or concerning symptoms.

## 2024-04-23 ENCOUNTER — Other Ambulatory Visit: Payer: Self-pay

## 2024-04-23 ENCOUNTER — Other Ambulatory Visit (HOSPITAL_BASED_OUTPATIENT_CLINIC_OR_DEPARTMENT_OTHER): Payer: Self-pay

## 2024-04-23 DIAGNOSIS — I10 Essential (primary) hypertension: Secondary | ICD-10-CM | POA: Diagnosis not present

## 2024-04-23 DIAGNOSIS — I2510 Atherosclerotic heart disease of native coronary artery without angina pectoris: Secondary | ICD-10-CM | POA: Diagnosis not present

## 2024-04-23 DIAGNOSIS — E1142 Type 2 diabetes mellitus with diabetic polyneuropathy: Secondary | ICD-10-CM | POA: Diagnosis not present

## 2024-04-23 DIAGNOSIS — E7800 Pure hypercholesterolemia, unspecified: Secondary | ICD-10-CM | POA: Diagnosis not present

## 2024-05-07 ENCOUNTER — Other Ambulatory Visit (HOSPITAL_COMMUNITY)

## 2024-05-07 ENCOUNTER — Ambulatory Visit (HOSPITAL_COMMUNITY)
Admission: RE | Admit: 2024-05-07 | Discharge: 2024-05-07 | Disposition: A | Discharge status: Home / Self Care | Source: Ambulatory Visit | Attending: Cardiology | Admitting: Cardiology

## 2024-05-07 DIAGNOSIS — I351 Nonrheumatic aortic (valve) insufficiency: Secondary | ICD-10-CM | POA: Diagnosis not present

## 2024-05-07 DIAGNOSIS — I2510 Atherosclerotic heart disease of native coronary artery without angina pectoris: Secondary | ICD-10-CM | POA: Insufficient documentation

## 2024-05-07 DIAGNOSIS — I350 Nonrheumatic aortic (valve) stenosis: Secondary | ICD-10-CM

## 2024-05-07 DIAGNOSIS — Z951 Presence of aortocoronary bypass graft: Secondary | ICD-10-CM | POA: Diagnosis not present

## 2024-05-07 LAB — ECHOCARDIOGRAM COMPLETE
AR max vel: 2.03 cm2
AV Area VTI: 1.88 cm2
AV Area mean vel: 2.32 cm2
AV Mean grad: 9.6 mmHg
AV Peak grad: 22.6 mmHg
Ao pk vel: 2.38 m/s
Area-P 1/2: 3.92 cm2
P 1/2 time: 407 ms
S' Lateral: 2.9 cm

## 2024-05-08 ENCOUNTER — Ambulatory Visit: Payer: Self-pay | Admitting: *Deleted

## 2024-05-12 ENCOUNTER — Encounter: Payer: Self-pay | Admitting: Cardiovascular Disease

## 2024-05-12 ENCOUNTER — Ambulatory Visit: Attending: Cardiovascular Disease | Admitting: Cardiovascular Disease

## 2024-05-12 VITALS — BP 122/60 | HR 60 | Ht 70.0 in | Wt 205.9 lb

## 2024-05-12 DIAGNOSIS — I480 Paroxysmal atrial fibrillation: Secondary | ICD-10-CM | POA: Insufficient documentation

## 2024-05-12 NOTE — Patient Instructions (Signed)

## 2024-05-12 NOTE — Progress Notes (Signed)
 Electrophysiology Office Note:    Date:  05/12/2024   ID:  Ruben [name], DOB June 23, 1946, MRN [id number]  PCP:  Regino Slater, MD   [facility] Providers Cardiologist:  Debby Sor, MD (Inactive) Electrophysiologist:  Eulas FORBES Furbish, MD     Referring MD: Regino Slater, MD   History of Present Illness:    Ruben [name] is a 78 y.o. male with a medical history significant for atrial flutter status post ablation in 2018, coronary disease status post CABG and PCI, referred for arrhythmia management.     He underwent ablation of typical atrial flutter by Dr. Kelsie in 2018.  He returned to the ER on Jan 27, 2024 with an atypical appearing atrial flutter.  The flutter wave morphology appeared slightly different on EKG than it did prior to his ablation in 2018.  Discussed the use of AI scribe software for clinical note transcription with the patient, who gave verbal consent to proceed.  History of Present Illness Ruben [name] is a 78 year old male with atrial flutter who presents with recurrence of atypical atrial flutter.  He underwent an atrial flutter ablation in 2018. Recently, he experienced a recurrence of atypical atrial flutter, with episodes occurring in mid-February and early May. These episodes were characterized by symptoms consistent with atrial flutter, including an increased heart rate.   He has a history of coronary artery disease, having undergone bypass surgery and PCI.   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   He underwent electrophysiology study in July 2025.  This CTI line remained blocked from prior ablation.  Atrial fibrillation was easily induced.  On the surface EKG, RR intervals were fairly regular.  I decided to proceed with pulmonary vein and left atrial posterior wall ablation.         Today, he reports that he feels well. No complaints, no  episodes since the last ER visit.  EKGs/Labs/Other Studies Reviewed Today:     Echocardiogram:  TTE August 2024 LV ejection fraction 60 to 65%.  No regional wall motion abnormalities.  Mild left atrial dilation.  Trivial mitral valve regurgitation.  Mild aortic valve regurgitation.    Stress testing:  Nuclear stress test July 2022 No ST segment deviation during stress.  No evidence of ischemia.  Normal study.  EKG:   EKG Interpretation Date/Time:  Tuesday May 12 2024 13:52:04 EDT Ventricular Rate:  60 PR Interval:  286 QRS Duration:  106 QT Interval:  416 QTC Calculation: 416 R Axis:   -42  Text Interpretation: Sinus rhythm with 1st degree A-V block Left axis deviation When compared with ECG of 26-Mar-2024 12:24, No significant change was found Confirmed by Furbish Eulas [phone]) on 05/12/2024 2:01:25 PM     Physical Exam:    VS:  BP 122/60   Pulse 60   Ht 5' 10 (1.778 m)   Wt 205 lb 14.4 oz (93.4 kg)   SpO2 95%   BMI 29.54 kg/m     Wt Readings from Last 3 Encounters:  05/12/24 205 lb 14.4 oz (93.4 kg)  03/26/24 200 lb (90.7 kg)  02/18/24 200 lb (90.7 kg)     GEN: Well nourished, well developed in no acute distress CARDIAC: RRR, no murmurs, rubs, gallops RESPIRATORY:  Normal work of breathing MUSCULOSKELETAL: no edema    ASSESSMENT & PLAN:     Atrial flutter and fibrillation S/p ablation of  typical atrial flutter in 2018 with durable block demonstrated during EP study July 2025 Underwent ablation of the pulmonary veins and posterior wall March 26, 2024 He feels well and has not had any recurrence of arrhythmia symptoms since the ablation  Continue apixaban  5mg  BID   CAD S/p CABG and PCI Stable, no angina   Signed, Eulas FORBES Furbish, MD  05/12/2024 2:04 PM    [facility]

## 2024-05-13 DIAGNOSIS — E1142 Type 2 diabetes mellitus with diabetic polyneuropathy: Secondary | ICD-10-CM | POA: Diagnosis not present

## 2024-05-13 DIAGNOSIS — I2510 Atherosclerotic heart disease of native coronary artery without angina pectoris: Secondary | ICD-10-CM | POA: Diagnosis not present

## 2024-05-13 DIAGNOSIS — I10 Essential (primary) hypertension: Secondary | ICD-10-CM | POA: Diagnosis not present

## 2024-05-14 MED ORDER — RANOLAZINE ER 1000 MG PO TB12: 1000.0000 mg | ORAL_TABLET | Freq: Two times a day (BID) | ORAL | 1 refills | Status: DC

## 2024-05-14 NOTE — Addendum Note (Signed)
 Addended by: Phill Steck W on: 05/14/2024 08:54 AM   Modules accepted: Orders

## 2024-05-16 ENCOUNTER — Telehealth: Payer: Self-pay | Admitting: Physician Assistant

## 2024-05-16 ENCOUNTER — Telehealth: Admitting: Family Medicine

## 2024-05-16 DIAGNOSIS — I480 Paroxysmal atrial fibrillation: Secondary | ICD-10-CM | POA: Diagnosis not present

## 2024-05-16 DIAGNOSIS — D6869 Other thrombophilia: Secondary | ICD-10-CM

## 2024-05-16 NOTE — Telephone Encounter (Signed)
 Pt has a hx of CAD s/p CABG, AFL s/p CTI ablation in 2018 and AFib w recent PVI ablation in 03/2024. He just had a follow up Dr. Nancey last week. He was in NSR then. He called the answering service today b/c he is back in AFib. He received notification on Apple watch. He feels like he typically does with atrial fibrillation.  HR 104, BP 113/63.  He took Dilt 30 mg x 1 without change. No chest pain, shortness of breath, near syncope.  I reassured him that it is not uncommon to have more AFib in the 3 mos post ablation. Currently symptoms, rate seem stable.   PLAN: -He can continue taking Diltiazem  30 mg every 6 hours unless he feels he goes back into NSR -Continue Eliquis  -Will ask office to call him Monday. If still in AFib he may need to come in for possible DCCV. -I advised him to go to the ED if he feels worse (chest pain, shortness of breath, dizziness, etc). Glendia Ferrier, PA-C    05/16/2024 10:33 AM

## 2024-05-16 NOTE — Progress Notes (Signed)
 Virtual Visit Consent   Ruben [name], you are scheduled for a virtual visit with a [facility] provider today. Just as with appointments in the office, your consent must be obtained to participate. Your consent will be active for this visit and any virtual visit you may have with one of our providers in the next 365 days. If you have a MyChart account, a copy of this consent can be sent to you electronically.  As this is a virtual visit, video technology does not allow for your provider to perform a traditional examination. This may limit your provider's ability to fully assess your condition. If your provider identifies any concerns that need to be evaluated in person or the need to arrange testing (such as labs, EKG, etc.), we will make arrangements to do so. Although advances in technology are sophisticated, we cannot ensure that it will always work on either your end or our end. If the connection with a video visit is poor, the visit may have to be switched to a telephone visit. With either a video or telephone visit, we are not always able to ensure that we have a secure connection.  By engaging in this virtual visit, you consent to the provision of healthcare and authorize for your insurance to be billed (if applicable) for the services provided during this visit. Depending on your insurance coverage, you may receive a charge related to this service.  I need to obtain your verbal consent now. Are you willing to proceed with your visit today? Ruben [name] has provided verbal consent on 05/16/2024 for a virtual visit (video or telephone). Ruben Lamp, FNP  Date: 05/16/2024 11:31 AM   Virtual Visit via Video Note   I, Ruben [name], connected with  Ruben [name]  ([id number], 02/06/1946) on 05/16/24 at 11:30 AM EDT by a video-enabled telemedicine application and verified that I am speaking with the correct person using two identifiers.  Location: Patient: Virtual Visit Location Patient:  Home Provider: Virtual Visit Location Provider: Home Office   I discussed the limitations of evaluation and management by telemedicine and the availability of in person appointments. The patient expressed understanding and agreed to proceed.    History of Present Illness: Ruben [name] is a 78 y.o. who identifies as a male who was assigned male at birth, and is being seen today for BP 106/63, heart out of rhythm since 8 am, 2 ablations for atrial flutter, ablation for a fib also. He has now talked to the afib clinic. Afib clinic told him to continue diltiazem  and it should go back into rhythm. No chest pain and he does not feel bad.   HPI: HPI  Problems:  Patient Active Problem List   Diagnosis Date Noted   Hypercoagulable state due to paroxysmal atrial fibrillation (HCC) 11/19/2023   Unstable angina (HCC) 09/27/2018   Atrial flutter (HCC) 02/02/2017   CAD (coronary artery disease) 07/13/2013   Mixed hyperlipidemia 07/13/2013   DM2 (diabetes mellitus, type 2) (HCC) 07/13/2013   Peripheral neuropathy 07/13/2013    Allergies:  Allergies  Allergen Reactions   Nortriptyline Other (See Comments)    Groggy at low dose   Cymbalta [Duloxetine Hcl] Diarrhea    Abdominal Pain, GI Intolerance   Niacin  Other (See Comments)    Flushing    Medications:  Current Outpatient Medications:    apixaban  (ELIQUIS ) 5 MG TABS tablet, Take 1 tablet (5 mg total) by mouth 2 (two) times daily., Disp: 180 tablet, Rfl: 2  benzonatate  (TESSALON ) 200 MG capsule, Take 1 capsule (200 mg total) by mouth every 8 (eight) hours., Disp: 30 capsule, Rfl: 0   carvedilol  (COREG ) 3.125 MG tablet, Take 1 tablet (3.125 mg total) by mouth in the morning, at noon, and at bedtime. (Patient taking differently: Take 3.125 mg by mouth 2 (two) times daily. May take a third 3.125 mg dose as needed for rapid heart rate), Disp: 270 tablet, Rfl: 2   Cholecalciferol (VITAMIN D3) 50 MCG (2000 UT) capsule, Take 2,000 Units by mouth  daily., Disp: , Rfl:    clopidogrel  (PLAVIX ) 75 MG tablet, Take 1 tablet (75 mg total) by mouth daily., Disp: 90 tablet, Rfl: 3   diltiazem  (CARDIZEM ) 30 MG tablet, Take 1 tablet every 4 hours AS NEEDED for AFIB heart rate >100 as long as top BP >100., Disp: 45 tablet, Rfl: 1   icosapent  Ethyl (VASCEPA ) 1 g capsule, Take 2 capsules (2 g total) by mouth 2 (two) times daily., Disp: 360 capsule, Rfl: 3   IRON-VITAMIN C PO, Take 1 tablet by mouth every Monday, Wednesday, and Friday., Disp: , Rfl:    isosorbide  mononitrate (IMDUR ) 60 MG 24 hr tablet, TAKE 1 TABLET(60 MG) BY MOUTH DAILY, Disp: 90 tablet, Rfl: 2   JARDIANCE  10 MG TABS tablet, Take 10 mg by mouth daily., Disp: , Rfl:    losartan  (COZAAR ) 25 MG tablet, Take 1 tablet (25 mg total) by mouth daily., Disp: 90 tablet, Rfl: 3   MAGNESIUM PO, Take 120 mg by mouth 2 (two) times daily., Disp: , Rfl:    Multiple Vitamin (MULTIVITAMIN WITH MINERALS) TABS tablet, Take 1 tablet by mouth daily., Disp: , Rfl:    nitroGLYCERIN  (NITROLINGUAL ) 0.4 MG/SPRAY spray, Place 1 spray under the tongue every 5 (five) minutes x 3 doses as needed for chest pain., Disp: 12 g, Rfl: 10   NUCYNTA  ER 200 MG TB12, Take 200 mg by mouth 2 (two) times daily., Disp: , Rfl: 0   OVER THE COUNTER MEDICATION, Take 1 tablet by mouth daily. NUCLEZYME-FORTE supplement, Disp: , Rfl:    OVER THE COUNTER MEDICATION, Take 1 capsule by mouth 2 (two) times daily. COCOAVIA supplement, Disp: , Rfl:    pioglitazone  (ACTOS ) 45 MG tablet, Take 45 mg by mouth daily., Disp: , Rfl:    pregabalin  (LYRICA ) 200 MG capsule, Take 200 mg by mouth 3 (three) times daily. , Disp: , Rfl:    Probiotic Product (PROBIOTIC PO), Take 1 capsule by mouth daily., Disp: , Rfl:    Quercetin 500 MG CAPS, Take 500 mg by mouth daily., Disp: , Rfl:    ranolazine  (RANEXA ) 1000 MG SR tablet, Take 1 tablet (1,000 mg total) by mouth 2 (two) times daily., Disp: 180 tablet, Rfl: 1   rosuvastatin  (CRESTOR ) 40 MG tablet, Take 1  tablet (40 mg total) by mouth daily., Disp: 90 tablet, Rfl: 3   spironolactone  (ALDACTONE ) 25 MG tablet, TAKE 1/2 TABLET(12.5 MG) BY MOUTH DAILY, Disp: 45 tablet, Rfl: 3   TURMERIC PO, Take 5 mLs by mouth daily. Liquid supplement, Disp: , Rfl:    Vitamin E 268 MG (400 UNIT) CAPS, Take 268 mg by mouth daily., Disp: , Rfl:    Vitamins-Lipotropics (LIPOFLAVONOID PO), Take 100 mg by mouth 3 (three) times daily., Disp: , Rfl:   Observations/Objective: Patient is well-developed, well-nourished in no acute distress.  Resting comfortably  at home.  Head is normocephalic, atraumatic.  No labored breathing.  Speech is clear and coherent with logical content.  Patient is alert and oriented at baseline.    Assessment and Plan: 1. Hypercoagulable state due to paroxysmal atrial fibrillation (HCC) (Primary)  ED if sx do not improve   Follow Up Instructions: I discussed the assessment and treatment plan with the patient. The patient was provided an opportunity to ask questions and all were answered. The patient agreed with the plan and demonstrated an understanding of the instructions.  A copy of instructions were sent to the patient via MyChart unless otherwise noted below.     The patient was advised to call back or seek an in-person evaluation if the symptoms worsen or if the condition fails to improve as anticipated.    Blanton Kardell, FNP

## 2024-05-16 NOTE — Patient Instructions (Signed)
 Atrial Fibrillation Atrial fibrillation (AFib) is a type of irregular or rapid heartbeat (arrhythmia). In AFib, the top part of the heart (atria) beats in an irregular pattern. This makes the heart unable to pump blood normally and effectively. The goal of treatment is to prevent blood clots from forming, control your heart rate, or restore your heartbeat to a normal rhythm. If this condition is not treated, it can cause serious problems, such as a weakened heart muscle (cardiomyopathy) or a stroke. What are the causes? This condition is often caused by medical conditions that damage the heart's electrical system. These include: High blood pressure (hypertension). This is the most common cause. Certain heart problems or conditions, such as heart failure, coronary artery disease, heart valve problems, or heart surgery. Diabetes. Overactive thyroid  (hyperthyroidism). Chronic kidney disease. Certain lung conditions, such as emphysema, pneumonia, or COPD. Obstructive sleep apnea. In some cases, the cause of this condition is not known. What increases the risk? This condition is more likely to develop in: Older adults. Athletes who do endurance exercise. People who have a family history of AFib. Males. People who are Caucasian. People who are obese. People who smoke or misuse alcohol. What are the signs or symptoms? Symptoms of this condition include: Fast or irregular heartbeats (palpitations). Discomfort or pain in your chest. Shortness of breath. Sudden light-headedness or weakness. Tiring easily during exercise or activity. Syncope (fainting). Sweating. In some cases, there are no symptoms. How is this diagnosed? Your health care provider may detect AFib when taking your pulse. If detected, this condition may be diagnosed with: An electrocardiogram (ECG) to check electrical signals of the heart. An ambulatory cardiac monitor to record your heart's activity for a few days. A  transthoracic echocardiogram (TTE) to create pictures of your heart. A transesophageal echocardiogram (TEE) to create even clearer pictures of your heart. A stress test to check your blood supply while you exercise. Imaging tests, such as a CT scan or chest X-ray. Blood tests. How is this treated? Treatment depends on underlying conditions and how you feel when you get AFib. This condition may be treated with: Medicines to prevent blood clots or to treat heart rate or heart rhythm problems. Electrical cardioversion to reset the heart's rhythm. A pacemaker to correct abnormal heart rhythm. Ablation to remove the heart tissue that sends abnormal signals. Left atrial appendage closure to seal the area where blood clots can form. In some cases, underlying conditions will be treated. Follow these instructions at home: Medicines Take over-the counter and prescription medicines only as told by your provider. Do not take any new medicines without talking to your provider. If you are taking blood thinners: Talk with your provider before taking aspirin  or NSAIDs. These medicines can raise your risk of bleeding. Take your medicines as told. Take them at the same time each day. Do not do things that could hurt or bruise you. Be careful to avoid falls. Wear an alert bracelet or carry a card that says that you take blood thinners. Lifestyle Do not use any products that contain nicotine or tobacco. These products include cigarettes, chewing tobacco, and vaping devices, such as e-cigarettes. If you need help quitting, ask your provider. Eat heart-healthy foods. Talk with a food expert (dietitian) to make an eating plan that is right for you. Exercise regularly as told by your provider. Do not drink alcohol. Lose weight if you are overweight. General instructions If you have obstructive sleep apnea, manage your condition as told by your provider.  Do not use diet pills unless your provider approves. Diet  pills can make heart problems worse. Keep all follow-up visits. Your provider will want to check your heart rate and rhythm regularly. Contact a health care provider if: You notice a change in the rate, rhythm, or strength of your heartbeat. You are taking a blood thinner and you notice more bruising. You tire more easily when you exercise or do heavy work. You have a sudden change in weight. Get help right away if:  You have chest pain. You have trouble breathing. You have side effects of blood thinners, such as blood in your vomit, poop (stool), or pee (urine), or bleeding that does not stop. You have any symptoms of a stroke. BE FAST is an easy way to remember the main warning signs of a stroke: B - Balance. Signs are dizziness, sudden trouble walking, or loss of balance. E - Eyes. Signs are trouble seeing or a sudden change in vision. F - Face. Signs are sudden weakness or numbness of the face, or the face or eyelid drooping on one side. A - Arms. Signs are weakness or numbness in an arm. This happens suddenly and usually on one side of the body. S - Speech.Signs are sudden trouble speaking, slurred speech, or trouble understanding what people say. T - Time. Time to call emergency services. Write down what time symptoms started. Other signs of a stroke, such as: A sudden, severe headache with no known cause. Nausea or vomiting. Seizure. These symptoms may be an emergency. Get help right away. Call 911. Do not wait to see if the symptoms will go away. Do not drive yourself to the hospital. This information is not intended to replace advice given to you by your health care provider. Make sure you discuss any questions you have with your health care provider. Document Revised: 05/30/2022 Document Reviewed: 05/30/2022 Elsevier Patient Education  2024 [employer].

## 2024-05-18 ENCOUNTER — Emergency Department (HOSPITAL_COMMUNITY)

## 2024-05-18 ENCOUNTER — Encounter (HOSPITAL_COMMUNITY): Payer: Self-pay

## 2024-05-18 ENCOUNTER — Emergency Department (HOSPITAL_COMMUNITY)
Admission: EM | Admit: 2024-05-18 | Discharge: 2024-05-18 | Disposition: A | Discharge status: Home / Self Care | Attending: Emergency Medicine | Admitting: Emergency Medicine

## 2024-05-18 ENCOUNTER — Other Ambulatory Visit: Payer: Self-pay

## 2024-05-18 ENCOUNTER — Ambulatory Visit: Payer: Self-pay | Admitting: Cardiovascular Disease

## 2024-05-18 DIAGNOSIS — E119 Type 2 diabetes mellitus without complications: Secondary | ICD-10-CM | POA: Diagnosis not present

## 2024-05-18 DIAGNOSIS — Z7901 Long term (current) use of anticoagulants: Secondary | ICD-10-CM | POA: Diagnosis not present

## 2024-05-18 DIAGNOSIS — I2510 Atherosclerotic heart disease of native coronary artery without angina pectoris: Secondary | ICD-10-CM | POA: Insufficient documentation

## 2024-05-18 DIAGNOSIS — I4892 Unspecified atrial flutter: Secondary | ICD-10-CM | POA: Diagnosis not present

## 2024-05-18 DIAGNOSIS — R000 Tachycardia, unspecified: Secondary | ICD-10-CM

## 2024-05-18 DIAGNOSIS — Z9682 Presence of neurostimulator: Secondary | ICD-10-CM | POA: Diagnosis not present

## 2024-05-18 DIAGNOSIS — I517 Cardiomegaly: Secondary | ICD-10-CM | POA: Diagnosis not present

## 2024-05-18 DIAGNOSIS — R531 Weakness: Secondary | ICD-10-CM | POA: Diagnosis not present

## 2024-05-18 LAB — CBC WITH DIFFERENTIAL/PLATELET
Abs Immature Granulocytes: 0.01 K/uL (ref 0.00–0.07)
Basophils Absolute: 0 K/uL (ref 0.0–0.1)
Basophils Relative: 1 %
Eosinophils Absolute: 0.1 K/uL (ref 0.0–0.5)
Eosinophils Relative: 2 %
HCT: 43.9 % (ref 39.0–52.0)
Hemoglobin: 14.5 g/dL (ref 13.0–17.0)
Immature Granulocytes: 0 %
Lymphocytes Relative: 19 %
Lymphs Abs: 1.1 K/uL (ref 0.7–4.0)
MCH: 29.3 pg (ref 26.0–34.0)
MCHC: 33 g/dL (ref 30.0–36.0)
MCV: 88.7 fL (ref 80.0–100.0)
Monocytes Absolute: 0.7 K/uL (ref 0.1–1.0)
Monocytes Relative: 12 %
Neutro Abs: 3.8 K/uL (ref 1.7–7.7)
Neutrophils Relative %: 66 %
Platelets: 238 K/uL (ref 150–400)
RBC: 4.95 MIL/uL (ref 4.22–5.81)
RDW: 17.2 % — ABNORMAL HIGH (ref 11.5–15.5)
WBC: 5.7 K/uL (ref 4.0–10.5)
nRBC: 0 % (ref 0.0–0.2)

## 2024-05-18 LAB — BASIC METABOLIC PANEL WITH GFR
Anion gap: 8 (ref 5–15)
BUN: 20 mg/dL (ref 8–23)
CO2: 24 mmol/L (ref 22–32)
Calcium: 9.2 mg/dL (ref 8.9–10.3)
Chloride: 102 mmol/L (ref 98–111)
Creatinine, Ser: 0.97 mg/dL (ref 0.61–1.24)
GFR, Estimated: >60 mL/min (ref >60)
Glucose, Bld: 126 mg/dL — ABNORMAL HIGH (ref 70–99)
Potassium: 3.8 mmol/L (ref 3.5–5.1)
Sodium: 134 mmol/L — ABNORMAL LOW (ref 135–145)

## 2024-05-18 MED ORDER — METOPROLOL TARTRATE 5 MG/5ML IV SOLN
10.0000 mg | Freq: Once | INTRAVENOUS | Status: AC
  Administered 2024-05-18: 10 mg via INTRAVENOUS
  Filled 2024-05-18: qty 10

## 2024-05-18 MED ORDER — CARVEDILOL 3.125 MG PO TABS
3.1250 mg | ORAL_TABLET | Freq: Once | ORAL | Status: AC
  Administered 2024-05-18: 3.125 mg via ORAL
  Filled 2024-05-18: qty 1

## 2024-05-18 NOTE — ED Triage Notes (Signed)
 Patient c/o of afib since Saturday morning, taking diltiazem  30mg  PO q4hr since with no conversion. Patient last took it at 0815 today, last ate at 1100, chicken salad. Patient endorses lethargy, denies chest pain, shob.

## 2024-05-18 NOTE — ED Provider Triage Note (Signed)
 Emergency Medicine Provider Triage Evaluation Note  Ruben [name] , a 78 y.o. male  was evaluated in triage.  Pt complains of heart palpitation. Hx of PAF, report his apple watch indicating HR in the 110-120s x 3 days.  Report feeling a bit tired, without cp.  Have been compliant with his Eliquis .  Took Diltiazem  daily without relief.  Did contact afib clinic but recommend to come to ER.  No sob  Review of Systems  Positive: As above Negative: As above  Physical Exam  BP 123/88 (BP Location: Right Arm)   Pulse (!) 110   Temp (!) 97.4 F (36.3 C)   Resp 16   SpO2 97%  Gen:   Awake, no distress   Resp:  Normal effort  MSK:   Moves extremities without difficulty  Other:    Medical Decision Making  Medically screening exam initiated at 12:56 PM.  Appropriate orders placed.  DAXX TIGGS was informed that the remainder of the evaluation will be completed by another provider, this initial triage assessment does not replace that evaluation, and the importance of remaining in the ED until their evaluation is complete.  Good cardioversion candidate   Nivia Colon, PA-C 05/18/24 1300

## 2024-05-18 NOTE — Discharge Instructions (Addendum)
 Ruben [name]  Thank you for allowing us  to take care of you today.  You came to the Emergency Department today because you have had rapid heart rate in the 100-110 range for the past several days that has not improved at home with your home oral diltiazem .  Here in the emergency department your labs were reassuring.  Your EKG did not show obviously that you are in atrial fibrillation, is possible that your in a normal rhythm (sinus) with a fast rate or abnormal atrial flutter.  We gave you a extra dose of a medication called metoprolol  through the IV, which is a cousin of the carvedilol  that you take at home, and your heart rate has now normalized.  We recommend returning to your previous schedule of carvedilol  of taking carvedilol  3 times a day scheduled, morning, noon, night, and taking a fourth dose for heart rates over 100.  You should follow-up with your cardiologist for further clarification of your carvedilol  and diltiazem  regimen, we are placing a referral to their clinic, and we encourage you to call if you have not heard from them within 72 hours of your discharge from the emergency department.  To-Do: 1. Please follow-up with your primary doctor within 2 days / as soon as possible.   Please return to the Emergency Department or call 911 if you experience have worsening of your symptoms, chest pain, shortness of breath, severe or significantly worsening pain, high fever, severe confusion, pass out or have any reason to think that you need emergency medical care.   We hope you feel better soon.   Mitzie Later, MD Department of Emergency Medicine [facility]

## 2024-05-18 NOTE — ED Provider Notes (Addendum)
 [place] EMERGENCY DEPARTMENT AT [facility] Provider Note   CSN: [id number] Arrival date & time: 05/18/24  1206     Patient presents with: Irregular Heart Beat   Ruben [name] is a 78 y.o. male.   Patient reports that he has a past medical history of T2DM, peripheral neuropathy, hyperlipidemia, CAD, atrial fibrillation on Eliquis  with no missed doses, prior ablation on coag with as needed diltiazem .  Patient presents for evaluation of persistent tachycardia.  Reports that he has previously had episodes where he has elevated heart rate, as high as what the 130s which were related to atrial fibrillation.  Reports that he is intermittently in atrial fibrillation.  Reports that he has not had a similar episode to this since his most recent ablation in July 2025.  Reports that he has been prescribed multiple antidysrhythmic's including carvedilol  and diltiazem .  Reports that his current medication regimen is carvedilol  twice daily (midday and evening) and diltiazem  every 4 hours as needed for heart rate greater than 100.  Reports that he has had persistent elevated heart rates, initially around 100, since 8/23.  Reports that over the past 24 hours his heart rate has been ranging from 100-110 despite use of diltiazem  p.o. approximately every 4 hours.  He has not taken any additional doses of carvedilol .  He has not had any fever, chills, chest pain, shortness of breath, nausea, vomiting, diarrhea, or other new or different symptoms.  He has not discussed this with his cardiologist as his prior cardiologist recently retired, and he has follow-up scheduled with his new cardiologist in December 2025.  The history is provided by the patient and the spouse.       Prior to Admission medications   Medication Sig Start Date End Date Taking? Authorizing Provider  apixaban  (ELIQUIS ) 5 MG TABS tablet Take 1 tablet (5 mg total) by mouth 2 (two) times daily. 11/22/23   Terra Fairy PARAS, PA-C   benzonatate  (TESSALON ) 200 MG capsule Take 1 capsule (200 mg total) by mouth every 8 (eight) hours. 04/22/24   Dreama, [place]  N, FNP  carvedilol  (COREG ) 3.125 MG tablet Take 1 tablet (3.125 mg total) by mouth in the morning, at noon, and at bedtime. Patient taking differently: Take 3.125 mg by mouth 2 (two) times daily. May take a third 3.125 mg dose as needed for rapid heart rate 11/19/23 05/12/24  Terra Fairy PARAS, PA-C  Cholecalciferol (VITAMIN D3) 50 MCG (2000 UT) capsule Take 2,000 Units by mouth daily.    [provider]  clopidogrel  (PLAVIX ) 75 MG tablet Take 1 tablet (75 mg total) by mouth daily. 07/18/23   Burnard Debby LABOR, MD  diltiazem  (CARDIZEM ) 30 MG tablet Take 1 tablet every 4 hours AS NEEDED for AFIB heart rate >100 as long as top BP >100. 01/24/24   Terra Fairy PARAS, PA-C  icosapent  Ethyl (VASCEPA ) 1 g capsule Take 2 capsules (2 g total) by mouth 2 (two) times daily. 07/22/23   Burnard Debby LABOR, MD  IRON-VITAMIN C PO Take 1 tablet by mouth every Monday, Wednesday, and Friday.    [provider]  isosorbide  mononitrate (IMDUR ) 60 MG 24 hr tablet TAKE 1 TABLET(60 MG) BY MOUTH DAILY 11/19/23   Burnard Debby LABOR, MD  JARDIANCE  10 MG TABS tablet Take 10 mg by mouth daily.    [provider]  losartan  (COZAAR ) 25 MG tablet Take 1 tablet (25 mg total) by mouth daily. 07/18/23   Burnard Debby LABOR, MD  MAGNESIUM  PO Take 120 mg by mouth 2 (two) times daily.    [provider]  Multiple Vitamin (MULTIVITAMIN WITH MINERALS) TABS tablet Take 1 tablet by mouth daily.    [provider]  nitroGLYCERIN  (NITROLINGUAL ) 0.4 MG/SPRAY spray Place 1 spray under the tongue every 5 (five) minutes x 3 doses as needed for chest pain. 01/30/24   Burnard Debby LABOR, MD  NUCYNTA  ER 200 MG TB12 Take 200 mg by mouth 2 (two) times daily. 12/05/17   [provider]  OVER THE COUNTER MEDICATION Take 1 tablet by mouth daily. NUCLEZYME-FORTE supplement    [provider]   OVER THE COUNTER MEDICATION Take 1 capsule by mouth 2 (two) times daily. COCOAVIA supplement    [provider]  pioglitazone  (ACTOS ) 45 MG tablet Take 45 mg by mouth daily.    [provider]  pregabalin  (LYRICA ) 200 MG capsule Take 200 mg by mouth 3 (three) times daily.     [provider]  Probiotic Product (PROBIOTIC PO) Take 1 capsule by mouth daily.    [provider]  Quercetin 500 MG CAPS Take 500 mg by mouth daily.    [provider]  ranolazine  (RANEXA ) 1000 MG SR tablet Take 1 tablet (1,000 mg total) by mouth 2 (two) times daily. 05/14/24   Wonda Sharper, MD  rosuvastatin  (CRESTOR ) 40 MG tablet Take 1 tablet (40 mg total) by mouth daily. 07/18/23   Burnard Debby LABOR, MD  spironolactone  (ALDACTONE ) 25 MG tablet TAKE 1/2 TABLET(12.5 MG) BY MOUTH DAILY 07/18/23   Burnard Debby LABOR, MD  TURMERIC PO Take 5 mLs by mouth daily. Liquid supplement    [provider]  Vitamin E 268 MG (400 UNIT) CAPS Take 268 mg by mouth daily.    [provider]  Vitamins-Lipotropics (LIPOFLAVONOID PO) Take 100 mg by mouth 3 (three) times daily.    [provider]    Allergies: Nortriptyline, Cymbalta [duloxetine hcl], and Niacin     Review of Systems as per HPI  Updated Vital Signs BP 110/86   Pulse 91   Temp (!) 97.4 F (36.3 C)   Resp 20   SpO2 98%   Physical Exam Vitals and nursing note reviewed.  Constitutional:      General: He is not in acute distress.    Appearance: He is well-developed.  HENT:     Head: Normocephalic and atraumatic.  Eyes:     Conjunctiva/sclera: Conjunctivae normal.  Cardiovascular:     Rate and Rhythm: Regular rhythm. Tachycardia present.     Heart sounds: No murmur heard. Pulmonary:     Effort: Pulmonary effort is normal. No respiratory distress.     Breath sounds: Normal breath sounds.  Abdominal:     Palpations: Abdomen is soft.     Tenderness: There is no abdominal tenderness.   Musculoskeletal:        General: No swelling.     Cervical back: Neck supple.     Right lower leg: No edema.     Left lower leg: No edema.  Skin:    General: Skin is warm and dry.     Capillary Refill: Capillary refill takes less than 2 seconds.  Neurological:     Mental Status: He is alert.  Psychiatric:        Mood and Affect: Mood normal.     (all labs ordered are listed, but only abnormal results are displayed) Labs Reviewed  BASIC METABOLIC PANEL WITH GFR - Abnormal; Notable for  the following components:      Result Value   Sodium 134 (*)    Glucose, Bld 126 (*)    All other components within normal limits  CBC WITH DIFFERENTIAL/PLATELET - Abnormal; Notable for the following components:   RDW 17.2 (*)    All other components within normal limits    EKG: EKG Interpretation Date/Time:  Monday May 18 2024 14:46:56 EDT Ventricular Rate:  110 PR Interval:  169 QRS Duration:  129 QT Interval:  343 QTC Calculation: 464 R Axis:   -63  Text Interpretation: Tachycardia  sinus tachycardia versus atrial flutter with buried P waves RBBB and LAFB Nonspecific ST and T wave abnormality Similar indeterminate rhythm from prior EKG from 05/18/24, prior EKG 05/12/24 with NSR Confirmed by Rogelia Satterfield ([id number]) on 05/18/2024 5:22:07 PM  Radiology: DG Chest 2 View Result Date: 05/18/2024 CLINICAL DATA:  Weakness EXAM: CHEST - 2 VIEW COMPARISON:  01/27/2024 FINDINGS: Prior CABG. Cardiomegaly. No confluent opacities, effusions or edema. No acute bony abnormality. Spinal stimulator with in place, unchanged. IMPRESSION: No active cardiopulmonary disease. Electronically Signed   By: Franky Crease M.D.   On: 05/18/2024 14:19    Procedures   Medications Ordered in the ED  metoprolol  tartrate (LOPRESSOR ) injection 10 mg (10 mg Intravenous Given 05/18/24 1602)  carvedilol  (COREG ) tablet 3.125 mg (3.125 mg Oral Given 05/18/24 1705)                                Medical Decision  Making ARLAN BIRKS is a 78 y.o. male who presents for tachycardia as per above.  Physical exam is pertinent for tachycardic heart rate with regular rhythm.   The differential includes but is not limited to sinus tachycardia, infection, electrolyte derangement, atrial fibrillation, atrial flutter.  Interventions: Metoprolol   See the EMR for full details regarding lab and imaging results.  Patient overall well-appearing on exam, no additional symptoms such as lower extremity swelling, chest pain, shortness of breath to raise concern for heart failure exacerbation, no fever, leukocytosis or focal symptoms to raise suspicion for infectious etiology, therefore low suspicion for tachycardia as a secondary response to their underlying cause given lack of focal findings.  With regard to tachydysrhythmia patient with regular tachycardia on initial and repeat EKG as well as physical exam, morphology of EKG most consistent with sinus tachycardia, though atrial flutter with buried P waves is not fully excluded.  Additionally, review of prior prescriptions and discussion with patient reveals that he previously has been prescribed carvedilol  3 times daily plus additional fourth dose as needed for heart rate greater than 100.  Given tachycardia with regular rhythm and previous changes to beta-blocker usage approximately 1 month ago, and no improvement with multiple p.o. doses of diltiazem , feel that IV beta-blocker is reasonable for management of tachydysrhythmia.  Metoprolol  administered and patient monitored with resolution of tachycardia.  Patient remains asymptomatic.  Given control of tachycardia achieved, and short acting nature of metoprolol  tartrate, given dose of home Coreg  prior to discharge to maintain rate control.  Discussed returning to prior carvedilol  regimen of 3 times daily with additional forth with as needed for heart rates greater than 100.  I do feel that patient requires cardiology follow-up  sooner than December 2025, therefore cardiology referral placed.  Patient and wife were comfortable with this plan, discussed returning to the emergency department for recurrent tachycardia that does not respond to her medications, or new  or different symptoms.  Consults: Not indicated  Disposition: DISCHARGE: I believe that the patient is safe for discharge home with outpatient follow-up. Patient was informed of all pertinent physical exam, laboratory, and imaging findings. Patient's suspected etiology of their symptom presentation was discussed with the patient and all questions were answered. We discussed following up with PCP, cardiology. I provided thorough ED return precautions. The patient feels safe and comfortable with this plan.  MDM generated using voice dictation software and may contain dictation errors.  Please contact me for any clarification or with any questions.     Amount and/or Complexity of Data Reviewed Independent Historian: spouse External Data Reviewed: ECG.    Details: Patient with prior EKGs typically in sinus rhythm Labs: ordered.    Details: CBC without leukocytosis, anemia, thrombocytopenia.  BMP without AKI or emergent electrolyte derangement Radiology: ordered and independent interpretation performed.    Details: Chest x-ray with similar cardiomegaly on comparison to prior, without focal airspace opacification, mediastinal silhouette derangement, pneumothorax, pleural effusion, bony derangement ECG/medicine tests: ordered and independent interpretation performed. Decision-making details documented in ED Course.  Risk Prescription drug management.     Final diagnoses:  Atrial flutter, unspecified type (HCC)  Tachycardia    ED Discharge Orders          Ordered    Ambulatory referral to Cardiology       Comments: If you have not heard from the Cardiology office within the next 72 hours please call [phone].   05/18/24 1638                Rogelia Jerilynn RAMAN, MD 05/18/24 1756    Rogelia Jerilynn RAMAN, MD 05/18/24 1758

## 2024-05-19 ENCOUNTER — Other Ambulatory Visit: Payer: Self-pay

## 2024-05-19 ENCOUNTER — Observation Stay (HOSPITAL_COMMUNITY)
Admission: EM | Admit: 2024-05-19 | Discharge: 2024-05-20 | Disposition: A | Discharge status: Home / Self Care | Attending: Internal Medicine | Admitting: Internal Medicine

## 2024-05-19 DIAGNOSIS — F1090 Alcohol use, unspecified, uncomplicated: Secondary | ICD-10-CM | POA: Insufficient documentation

## 2024-05-19 DIAGNOSIS — I2510 Atherosclerotic heart disease of native coronary artery without angina pectoris: Secondary | ICD-10-CM | POA: Diagnosis not present

## 2024-05-19 DIAGNOSIS — E119 Type 2 diabetes mellitus without complications: Secondary | ICD-10-CM | POA: Insufficient documentation

## 2024-05-19 DIAGNOSIS — I10 Essential (primary) hypertension: Secondary | ICD-10-CM | POA: Insufficient documentation

## 2024-05-19 DIAGNOSIS — I4892 Unspecified atrial flutter: Secondary | ICD-10-CM | POA: Diagnosis not present

## 2024-05-19 DIAGNOSIS — E1140 Type 2 diabetes mellitus with diabetic neuropathy, unspecified: Secondary | ICD-10-CM

## 2024-05-19 DIAGNOSIS — E785 Hyperlipidemia, unspecified: Secondary | ICD-10-CM | POA: Diagnosis not present

## 2024-05-19 DIAGNOSIS — I480 Paroxysmal atrial fibrillation: Principal | ICD-10-CM | POA: Insufficient documentation

## 2024-05-19 DIAGNOSIS — E782 Mixed hyperlipidemia: Secondary | ICD-10-CM | POA: Insufficient documentation

## 2024-05-19 DIAGNOSIS — R000 Tachycardia, unspecified: Secondary | ICD-10-CM | POA: Diagnosis present

## 2024-05-19 LAB — CBC WITH DIFFERENTIAL/PLATELET
Abs Immature Granulocytes: 0.02 K/uL (ref 0.00–0.07)
Basophils Absolute: 0 K/uL (ref 0.0–0.1)
Basophils Relative: 0 %
Eosinophils Absolute: 0.1 K/uL (ref 0.0–0.5)
Eosinophils Relative: 2 %
HCT: 44.7 % (ref 39.0–52.0)
Hemoglobin: 14.8 g/dL (ref 13.0–17.0)
Immature Granulocytes: 0 %
Lymphocytes Relative: 13 %
Lymphs Abs: 0.9 K/uL (ref 0.7–4.0)
MCH: 29.8 pg (ref 26.0–34.0)
MCHC: 33.1 g/dL (ref 30.0–36.0)
MCV: 90.1 fL (ref 80.0–100.0)
Monocytes Absolute: 0.6 K/uL (ref 0.1–1.0)
Monocytes Relative: 9 %
Neutro Abs: 5.2 K/uL (ref 1.7–7.7)
Neutrophils Relative %: 76 %
Platelets: 212 K/uL (ref 150–400)
RBC: 4.96 MIL/uL (ref 4.22–5.81)
RDW: 17.3 % — ABNORMAL HIGH (ref 11.5–15.5)
WBC: 6.8 K/uL (ref 4.0–10.5)
nRBC: 0 % (ref 0.0–0.2)

## 2024-05-19 LAB — BASIC METABOLIC PANEL WITH GFR
Anion gap: 10 (ref 5–15)
BUN: 23 mg/dL (ref 8–23)
CO2: 22 mmol/L (ref 22–32)
Calcium: 8.9 mg/dL (ref 8.9–10.3)
Chloride: 102 mmol/L (ref 98–111)
Creatinine, Ser: 0.96 mg/dL (ref 0.61–1.24)
GFR, Estimated: >60 mL/min (ref >60)
Glucose, Bld: 120 mg/dL — ABNORMAL HIGH (ref 70–99)
Potassium: 4.5 mmol/L (ref 3.5–5.1)
Sodium: 134 mmol/L — ABNORMAL LOW (ref 135–145)

## 2024-05-19 LAB — TSH: TSH: 1.139 u[IU]/mL (ref 0.350–4.500)

## 2024-05-19 LAB — BRAIN NATRIURETIC PEPTIDE: B Natriuretic Peptide: 100.3 pg/mL — ABNORMAL HIGH (ref 0.0–100.0)

## 2024-05-19 LAB — T4, FREE: Free T4: 1.08 ng/dL (ref 0.61–1.12)

## 2024-05-19 LAB — MRSA NEXT GEN BY PCR, NASAL: MRSA by PCR Next Gen: NOT DETECTED

## 2024-05-19 LAB — MAGNESIUM: Magnesium: 2.3 mg/dL (ref 1.7–2.4)

## 2024-05-19 MED ORDER — DILTIAZEM HCL-DEXTROSE 125-5 MG/125ML-% IV SOLN (PREMIX)
5.0000 mg/h | INTRAVENOUS | Status: DC
  Administered 2024-05-19: 12.5 mg/h via INTRAVENOUS
  Administered 2024-05-19: 5 mg/h via INTRAVENOUS
  Administered 2024-05-20: 12.5 mg/h via INTRAVENOUS
  Filled 2024-05-19 (×3): qty 125

## 2024-05-19 MED ORDER — ROSUVASTATIN CALCIUM 20 MG PO TABS
40.0000 mg | ORAL_TABLET | Freq: Every evening | ORAL | Status: DC
  Administered 2024-05-19: 40 mg via ORAL
  Filled 2024-05-19: qty 2

## 2024-05-19 MED ORDER — ALBUTEROL SULFATE (2.5 MG/3ML) 0.083% IN NEBU: 2.5000 mg | INHALATION_SOLUTION | Freq: Four times a day (QID) | RESPIRATORY_TRACT | Status: DC | PRN

## 2024-05-19 MED ORDER — ISOSORBIDE MONONITRATE ER 60 MG PO TB24
60.0000 mg | ORAL_TABLET | Freq: Every day | ORAL | Status: DC
  Administered 2024-05-20: 60 mg via ORAL
  Filled 2024-05-19: qty 1

## 2024-05-19 MED ORDER — DILTIAZEM LOAD VIA INFUSION
10.0000 mg | Freq: Once | INTRAVENOUS | Status: AC
  Administered 2024-05-19: 10 mg via INTRAVENOUS
  Filled 2024-05-19: qty 10

## 2024-05-19 MED ORDER — TAPENTADOL HCL ER 100 MG PO TB12
200.0000 mg | ORAL_TABLET | Freq: Two times a day (BID) | ORAL | Status: DC
  Administered 2024-05-19 – 2024-05-20 (×2): 200 mg via ORAL
  Filled 2024-05-19 (×2): qty 2

## 2024-05-19 MED ORDER — APIXABAN 5 MG PO TABS: 5.0000 mg | ORAL_TABLET | Freq: Two times a day (BID) | ORAL | Status: DC

## 2024-05-19 MED ORDER — PIOGLITAZONE HCL 15 MG PO TABS
45.0000 mg | ORAL_TABLET | Freq: Every day | ORAL | Status: DC
  Filled 2024-05-19: qty 1

## 2024-05-19 MED ORDER — SODIUM CHLORIDE 0.9 % IV SOLN: INTRAVENOUS | Status: DC

## 2024-05-19 MED ORDER — SODIUM CHLORIDE 0.9% FLUSH
3.0000 mL | Freq: Two times a day (BID) | INTRAVENOUS | Status: DC
  Administered 2024-05-20: 3 mL via INTRAVENOUS

## 2024-05-19 MED ORDER — ACETAMINOPHEN 325 MG PO TABS: 650.0000 mg | ORAL_TABLET | Freq: Four times a day (QID) | ORAL | Status: DC | PRN

## 2024-05-19 MED ORDER — SPIRONOLACTONE 12.5 MG HALF TABLET
12.5000 mg | ORAL_TABLET | Freq: Every day | ORAL | Status: DC
Start: 2024-05-19 — End: 2024-05-20
  Administered 2024-05-20: 12.5 mg via ORAL
  Filled 2024-05-19 (×2): qty 1

## 2024-05-19 MED ORDER — RANOLAZINE ER 500 MG PO TB12
1000.0000 mg | ORAL_TABLET | Freq: Two times a day (BID) | ORAL | Status: DC
  Administered 2024-05-19 – 2024-05-20 (×2): 1000 mg via ORAL
  Filled 2024-05-19 (×3): qty 2

## 2024-05-19 MED ORDER — LOSARTAN POTASSIUM 25 MG PO TABS
25.0000 mg | ORAL_TABLET | Freq: Every evening | ORAL | Status: DC
  Administered 2024-05-19: 25 mg via ORAL
  Filled 2024-05-19: qty 1

## 2024-05-19 MED ORDER — CLOPIDOGREL BISULFATE 75 MG PO TABS
75.0000 mg | ORAL_TABLET | Freq: Every day | ORAL | Status: DC
  Administered 2024-05-20: 75 mg via ORAL
  Filled 2024-05-19: qty 1

## 2024-05-19 MED ORDER — PREGABALIN 75 MG PO CAPS
200.0000 mg | ORAL_CAPSULE | Freq: Three times a day (TID) | ORAL | Status: DC
  Administered 2024-05-19 – 2024-05-20 (×3): 200 mg via ORAL
  Filled 2024-05-19 (×2): qty 1

## 2024-05-19 MED ORDER — EMPAGLIFLOZIN 10 MG PO TABS
10.0000 mg | ORAL_TABLET | Freq: Every day | ORAL | Status: DC
  Administered 2024-05-19 – 2024-05-20 (×2): 10 mg via ORAL
  Filled 2024-05-19 (×2): qty 1

## 2024-05-19 MED ORDER — ACETAMINOPHEN 650 MG RE SUPP: 650.0000 mg | Freq: Four times a day (QID) | RECTAL | Status: DC | PRN

## 2024-05-19 MED ORDER — APIXABAN 5 MG PO TABS
5.0000 mg | ORAL_TABLET | Freq: Two times a day (BID) | ORAL | Status: DC
  Administered 2024-05-19 – 2024-05-20 (×2): 5 mg via ORAL
  Filled 2024-05-19 (×2): qty 1

## 2024-05-19 NOTE — ED Triage Notes (Signed)
 Pt. Stated, I was here yesterday for the same symptoms.I was given medication to bring it down but as soon as I left it came back.  I took all my medication this morning around 730.

## 2024-05-19 NOTE — H&P (Addendum)
 History and Physical    Patient: Ruben [name] FMW:[id number] DOB: Nov 07, 1945 DOA: 05/19/2024 DOS: the patient was seen and examined on 05/19/2024 PCP: Regino Slater, MD  Patient coming from: Home  Chief Complaint:  Chief Complaint  Patient presents with   Tachycardia   lethargic   HPI: Ruben [name] is a 78 y.o. male with medical history significant of hypertension, hyperlipidemia, atrial fibrillation/flutter s/p prior ablation on chronic anticoagulation, CAD s/p CABG and PCI, diabetes mellitus type 2 presents  with lethargy.  He has been experiencing palpitations and a persistent heart rate over 100 beats per minute since Saturday morning, as detected by his Apple Watch. e describes feeling very sluggish and lethargic, with no lightheadedness or dizziness, but a general sense of unwellness and lack of desire to engage in activities.  He underwent a cardiac ablation on July 3rd and had been doing well until the onset of symptoms on Saturday morning. A follow-up appointment last week showed no events at that time. He has been taking Eliquis  daily without missing any doses and is currently on carvedilol  for heart rate control. Over the weekend, he tried diltiazem  30 mg orally, which did not alleviate his symptoms.  He was seen in the emergency department yesterday with reported symptoms and given IV metoprolol  with improvement in heart rate control.  Upon admission into the emergency department patient was noted to be afebrile with heart rates elevated into the 110s, and all other vital signs relatively maintained.  Labs noted sodium 134, potassium 4.5, glucose 120, and BNP 100.3.  TSH and free T4 were within normal limits.  Patient had been started on a Cardizem  drip with improvement in heart rates.  Cardiology had been consulted to eluate.   Review of Systems: As mentioned in the history of present illness. All other systems reviewed and are negative. Past Medical History:  Diagnosis  Date   Atrial flutter (HCC) 02/02/2017   CAD, multiple vessel    Diabetes mellitus (HCC)    Dyslipidemia    Hypertension    Past Surgical History:  Procedure Laterality Date   A-FLUTTER ABLATION N/A 02/05/2017   Procedure: A-Flutter Ablation;  Surgeon: Kelsie Agent, MD;  Location: [facility] INVASIVE CV LAB;  Service: Cardiovascular;  Laterality: N/A;   A-FLUTTER ABLATION N/A 03/26/2024   Procedure: A-FLUTTER ABLATION;  Surgeon: Nancey Eulas BRAVO, MD;  Location: [facility] INVASIVE CV LAB;  Service: Cardiovascular;  Laterality: N/A;   CARDIAC CATHETERIZATION  12/13/2011   No intervention - recommend medical therapy with increased medical trial   CARDIOVASCULAR STRESS TEST  10/26/2010   No scintigraphic evidence of inducible myocardial ischemia. No ECG changes. EKG negative for ischemia.   CORONARY ARTERY BYPASS GRAFT  1997   LIMA-LAD, vein graft to diagonal, vein graft to obtuse marginal, and vein graft to RCA   LEFT HEART CATH AND CORS/GRAFTS ANGIOGRAPHY N/A 09/29/2018   Procedure: LEFT HEART CATH AND CORS/GRAFTS ANGIOGRAPHY;  Surgeon: Anner Alm ORN, MD;  Location: [facility] INVASIVE CV LAB;  Service: Cardiovascular;  Laterality: N/A;   LEFT HEART CATHETERIZATION WITH CORONARY/GRAFT ANGIOGRAM N/A 12/13/2011   Procedure: LEFT HEART CATHETERIZATION WITH EL BILE;  Surgeon: Debby DELENA Sor, MD;  Location: [facility] CATH LAB;  Service: Cardiovascular;  Laterality: N/A;   LOWER EXTREMITY ARTERIAL DOPPLER  01/30/2000   Normal arterial study   TEE WITHOUT CARDIOVERSION N/A 02/04/2017   Procedure: TRANSESOPHAGEAL ECHOCARDIOGRAM (TEE);  Surgeon: Maranda Leim DEL, MD;  Location: [facility] ENDOSCOPY;  Service: Cardiovascular;  Laterality: N/A;   TRANSTHORACIC  ECHOCARDIOGRAM  12/12/2011   EF >55%, LA severely dilated.   ULTRASOUND GUIDANCE FOR VASCULAR ACCESS  09/29/2018   Procedure: Ultrasound Guidance For Vascular Access;  Surgeon: Anner Alm ORN, MD;  Location: [facility] INVASIVE CV LAB;  Service: Cardiovascular;;   Social  History:  reports that he has never smoked. He has never used smokeless tobacco. He reports current alcohol use. He reports that he does not use drugs.  Allergies  Allergen Reactions   Nortriptyline Other (See Comments)    Groggy at low dose   Cymbalta [Duloxetine Hcl] Diarrhea    Abdominal Pain, GI Intolerance   Niacin  Other (See Comments)    Flushing     Family History  Problem Relation Age of Onset   Heart disease Father    Diabetes Father    Hyperlipidemia Sister     Prior to Admission medications   Medication Sig Start Date End Date Taking? Authorizing Provider  apixaban  (ELIQUIS ) 5 MG TABS tablet Take 1 tablet (5 mg total) by mouth 2 (two) times daily. 11/22/23  Yes Terra Fairy PARAS, PA-C  carvedilol  (COREG ) 3.125 MG tablet Take 1 tablet (3.125 mg total) by mouth in the morning, at noon, and at bedtime. Patient taking differently: Take 3.125 mg by mouth 2 (two) times daily. May take a third 3.125 mg dose as needed for rapid heart rate 11/19/23 05/19/24 Yes Terra Fairy PARAS, PA-C  Cholecalciferol (VITAMIN D3) 50 MCG (2000 UT) capsule Take 2,000 Units by mouth daily.   Yes [provider]  clopidogrel  (PLAVIX ) 75 MG tablet Take 1 tablet (75 mg total) by mouth daily. 07/18/23  Yes Burnard Debby LABOR, MD  diltiazem  (CARDIZEM ) 30 MG tablet Take 1 tablet every 4 hours AS NEEDED for AFIB heart rate >100 as long as top BP >100. 01/24/24  Yes Terra Fairy PARAS, PA-C  icosapent  Ethyl (VASCEPA ) 1 g capsule Take 2 capsules (2 g total) by mouth 2 (two) times daily. 07/22/23  Yes Burnard Debby LABOR, MD  IRON-VITAMIN C PO Take 1 tablet by mouth every Monday, Wednesday, and Friday.   Yes [provider]  isosorbide  mononitrate (IMDUR ) 60 MG 24 hr tablet TAKE 1 TABLET(60 MG) BY MOUTH DAILY 11/19/23  Yes Burnard Debby LABOR, MD  JARDIANCE  10 MG TABS tablet Take 10 mg by mouth daily.   Yes [provider]  losartan  (COZAAR ) 25 MG tablet Take 1 tablet (25 mg total) by mouth daily. Patient  taking differently: Take 25 mg by mouth every evening. 07/18/23  Yes Burnard Debby LABOR, MD  MAGNESIUM PO Take 120 mg by mouth 2 (two) times daily.   Yes [provider]  Multiple Vitamin (MULTIVITAMIN WITH MINERALS) TABS tablet Take 1 tablet by mouth daily.   Yes [provider]  nitroGLYCERIN  (NITROLINGUAL ) 0.4 MG/SPRAY spray Place 1 spray under the tongue every 5 (five) minutes x 3 doses as needed for chest pain. 01/30/24  Yes Burnard Debby LABOR, MD  NUCYNTA  ER 200 MG TB12 Take 200 mg by mouth 2 (two) times daily. 12/05/17  Yes [provider]  OVER THE COUNTER MEDICATION Take 1 tablet by mouth daily. NUCLEZYME-FORTE supplement   Yes [provider]  OVER THE COUNTER MEDICATION Take 1 capsule by mouth 2 (two) times daily. COCOAVIA supplement   Yes [provider]  pioglitazone  (ACTOS ) 45 MG tablet Take 45 mg by mouth daily before supper.   Yes [provider]  pregabalin  (LYRICA ) 200 MG capsule Take 200 mg by mouth 3 (three) times  daily.    Yes [provider]  Probiotic Product (PROBIOTIC PO) Take 1 capsule by mouth daily.   Yes [provider]  Quercetin 500 MG CAPS Take 500 mg by mouth daily.   Yes [provider]  ranolazine  (RANEXA ) 1000 MG SR tablet Take 1 tablet (1,000 mg total) by mouth 2 (two) times daily. 05/14/24  Yes Wonda Sharper, MD  rosuvastatin  (CRESTOR ) 40 MG tablet Take 1 tablet (40 mg total) by mouth daily. Patient taking differently: Take 40 mg by mouth every evening. 07/18/23  Yes Burnard Debby LABOR, MD  spironolactone  (ALDACTONE ) 25 MG tablet TAKE 1/2 TABLET(12.5 MG) BY MOUTH DAILY 07/18/23  Yes Burnard Debby LABOR, MD  TURMERIC PO Take 5 mLs by mouth daily. Liquid supplement   Yes [provider]  Vitamin E 268 MG (400 UNIT) CAPS Take 268 mg by mouth daily.   Yes [provider]  Vitamins-Lipotropics (LIPOFLAVONOID PO) Take 100 mg by mouth 3 (three) times daily.   Yes [provider]    Physical Exam: Vitals:   05/19/24 0852 05/19/24 1130  BP: 124/78 112/86  Pulse: (!) 109 (!) 108  Resp: 17 15  Temp: 98 F (36.7 C)   SpO2: 97% 98%    Constitutional: Elderly male currently in no acute distress Eyes: PERRL, lids and conjunctivae normal ENMT: Mucous membranes are moist. Posterior pharynx clear of any exudate or lesions.Normal dentition.  Neck: normal, supple, no masses, no JVD Respiratory: clear to auscultation bilaterally, no wheezing, no crackles. Normal respiratory effort. No accessory muscle use.  Cardiovascular: Irregular. No extremity edema. 2+ pedal pulses. Abdomen: no tenderness, no masses palpated. No hepatosplenomegaly. Bowel sounds positive.  Musculoskeletal: no clubbing / cyanosis. No joint deformity upper and lower extremities. Good ROM, no contractures. Normal muscle tone.  Skin: no rashes, lesions, ulcers. No induration Neurologic: CN 2-12 grossly intact. Sensation intact, DTR normal. Strength 5/5 in all 4.  Psychiatric: Normal judgment and insight. Alert and oriented x 3. Normal mood.   Data Reviewed:  Initial EKG showing concern for atrial flutter at 111 bpm.  Reviewed labs, imaging, and prior records as documented.    Assessment and Plan:  Paroxysmal atrial fibrillation/flutter on chronic anticoagulation Acute.  Patient presents with complaints of lethargy and heart elevated into the 110s in atrial fibrillation/flutter, is 3 days. Patient had just recently underwent ablation for atrial flutter in July 3.  He had followed up with Dr. Nancey of electrophysiology on 8/19 where he was not noted to have had any subsequent arrhythmias at that time. CHA2DS2-VASc score equal to at least 5. - Admit to a cardiac telemetry bed - Continue Cardizem  drip - Goal potassium at least 4 magnesium at least 2.  Replace electrolytes as needed to goal. - Appreciate cardiology consultative services,  will follow-up for any further recommendations.  CAD S/p prior  CABG and PCI.  Patient denies any complaints of chest pain at this time. - Continue Crestor , losartan , Ranexa , and isosorbide  mononitrate  Controlled diabetes mellitus type 2 Last available hemoglobin A1c noted to be 5.7 when checked on 03/23/2024 - Continue Jardiance  and Actos   Essential hypertension Blood pressures currently maintained. - Continue losartan  and spironolactone   Hyperlipidemia - Continue Crestor   DVT prophylaxis: Eliquis  Advance Care Planning:   Code Status: Full Code   Consults: Cardiology  Family Communication: None none  Severity of Illness: The appropriate patient status for this patient is OBSERVATION. Observation status is judged to be reasonable and necessary in order to provide  the required intensity of service to ensure the patient's safety. The patient's presenting symptoms, physical exam findings, and initial radiographic and laboratory data in the context of their medical condition is felt to place them at decreased risk for further clinical deterioration. Furthermore, it is anticipated that the patient will be medically stable for discharge from the hospital within 2 midnights of admission.   Author: Maximino DELENA Sharps, MD 05/19/2024 1:21 PM  For on call review [email or web address].

## 2024-05-19 NOTE — Telephone Encounter (Signed)
 Patient is currently back in the ED

## 2024-05-19 NOTE — Plan of Care (Signed)

## 2024-05-19 NOTE — H&P (View-Only) (Signed)
 Cardiology Consultation   Patient ID: Ruben [name] MRN: [id number]; DOB: 08-07-1946  Admit date: 05/19/2024 Date of Consult: 05/19/2024  PCP:  Regino Slater, MD   [facility] Providers Cardiologist:  Debby Sor, MD (Inactive)  Electrophysiologist:  Eulas FORBES Furbish, MD       Patient Profile: Ruben [name] is a 78 y.o. male with a hx of atrial flutter status post ablation in 2018 and most recent ablation in July 2025, coronary artery disease status post CABG and PCI, diabetes mellitus, dyslipidemia and hypertension who is being seen 05/19/2024 for the evaluation of symptomatic atrial flutter at the request of Dr. Jakie.  History of Present Illness: Ruben [name] presented to the emergency department yesterday for persistent tachycardia and reported that he has been experiencing palpitations and a persistent heart rate over 100 beats per minute since Saturday morning, as detected by his Apple Watch. e describes feeling very sluggish and lethargic, with no lightheadedness or dizziness, but a general sense of unwellness and lack of desire to engage in activities.   He in the ED he was giving metoprolol  which help decrease his heart rate and was discharged to home.   He still feels fatigue back here is the ED.   Past Medical History:  Diagnosis Date   Atrial flutter (HCC) 02/02/2017   CAD, multiple vessel    Diabetes mellitus (HCC)    Dyslipidemia    Hypertension     Past Surgical History:  Procedure Laterality Date   A-FLUTTER ABLATION N/A 02/05/2017   Procedure: A-Flutter Ablation;  Surgeon: Kelsie Agent, MD;  Location: [facility] INVASIVE CV LAB;  Service: Cardiovascular;  Laterality: N/A;   A-FLUTTER ABLATION N/A 03/26/2024   Procedure: A-FLUTTER ABLATION;  Surgeon: Furbish Eulas FORBES, MD;  Location: [facility] INVASIVE CV LAB;  Service: Cardiovascular;  Laterality: N/A;   CARDIAC CATHETERIZATION  12/13/2011   No intervention - recommend medical therapy with increased medical  trial   CARDIOVASCULAR STRESS TEST  10/26/2010   No scintigraphic evidence of inducible myocardial ischemia. No ECG changes. EKG negative for ischemia.   CORONARY ARTERY BYPASS GRAFT  1997   LIMA-LAD, vein graft to diagonal, vein graft to obtuse marginal, and vein graft to RCA   LEFT HEART CATH AND CORS/GRAFTS ANGIOGRAPHY N/A 09/29/2018   Procedure: LEFT HEART CATH AND CORS/GRAFTS ANGIOGRAPHY;  Surgeon: Anner Alm ORN, MD;  Location: [facility] INVASIVE CV LAB;  Service: Cardiovascular;  Laterality: N/A;   LEFT HEART CATHETERIZATION WITH CORONARY/GRAFT ANGIOGRAM N/A 12/13/2011   Procedure: LEFT HEART CATHETERIZATION WITH EL BILE;  Surgeon: Debby DELENA Sor, MD;  Location: [facility] CATH LAB;  Service: Cardiovascular;  Laterality: N/A;   LOWER EXTREMITY ARTERIAL DOPPLER  01/30/2000   Normal arterial study   TEE WITHOUT CARDIOVERSION N/A 02/04/2017   Procedure: TRANSESOPHAGEAL ECHOCARDIOGRAM (TEE);  Surgeon: Maranda Leim DEL, MD;  Location: [facility] ENDOSCOPY;  Service: Cardiovascular;  Laterality: N/A;   TRANSTHORACIC ECHOCARDIOGRAM  12/12/2011   EF >55%, LA severely dilated.   ULTRASOUND GUIDANCE FOR VASCULAR ACCESS  09/29/2018   Procedure: Ultrasound Guidance For Vascular Access;  Surgeon: Anner Alm ORN, MD;  Location: [facility] INVASIVE CV LAB;  Service: Cardiovascular;;     Home Medications:  Prior to Admission medications   Medication Sig Start Date End Date Taking? Authorizing Provider  apixaban  (ELIQUIS ) 5 MG TABS tablet Take 1 tablet (5 mg total) by mouth 2 (two) times daily. 11/22/23  Yes Terra Fairy PARAS, PA-C  carvedilol  (COREG ) 3.125 MG tablet Take 1 tablet (3.125 mg total)  by mouth in the morning, at noon, and at bedtime. Patient taking differently: Take 3.125 mg by mouth 2 (two) times daily. May take a third 3.125 mg dose as needed for rapid heart rate 11/19/23 05/19/24 Yes Terra Fairy PARAS, PA-C  Cholecalciferol (VITAMIN D3) 50 MCG (2000 UT) capsule Take 2,000 Units by mouth daily.   Yes [provider]  clopidogrel  (PLAVIX ) 75 MG tablet Take 1 tablet (75 mg total) by mouth daily. 07/18/23  Yes Burnard Debby LABOR, MD  diltiazem  (CARDIZEM ) 30 MG tablet Take 1 tablet every 4 hours AS NEEDED for AFIB heart rate >100 as long as top BP >100. 01/24/24  Yes Terra Fairy PARAS, PA-C  icosapent  Ethyl (VASCEPA ) 1 g capsule Take 2 capsules (2 g total) by mouth 2 (two) times daily. 07/22/23  Yes Burnard Debby LABOR, MD  IRON-VITAMIN C PO Take 1 tablet by mouth every Monday, Wednesday, and Friday.   Yes [provider]  isosorbide  mononitrate (IMDUR ) 60 MG 24 hr tablet TAKE 1 TABLET(60 MG) BY MOUTH DAILY 11/19/23  Yes Burnard Debby LABOR, MD  JARDIANCE  10 MG TABS tablet Take 10 mg by mouth daily.   Yes [provider]  losartan  (COZAAR ) 25 MG tablet Take 1 tablet (25 mg total) by mouth daily. Patient taking differently: Take 25 mg by mouth every evening. 07/18/23  Yes Burnard Debby LABOR, MD  MAGNESIUM PO Take 120 mg by mouth 2 (two) times daily.   Yes [provider]  Multiple Vitamin (MULTIVITAMIN WITH MINERALS) TABS tablet Take 1 tablet by mouth daily.   Yes [provider]  nitroGLYCERIN  (NITROLINGUAL ) 0.4 MG/SPRAY spray Place 1 spray under the tongue every 5 (five) minutes x 3 doses as needed for chest pain. 01/30/24  Yes Burnard Debby LABOR, MD  NUCYNTA  ER 200 MG TB12 Take 200 mg by mouth 2 (two) times daily. 12/05/17  Yes [provider]  OVER THE COUNTER MEDICATION Take 1 tablet by mouth daily. NUCLEZYME-FORTE supplement   Yes [provider]  OVER THE COUNTER MEDICATION Take 1 capsule by mouth 2 (two) times daily. COCOAVIA supplement   Yes [provider]  pioglitazone  (ACTOS ) 45 MG tablet Take 45 mg by mouth daily before supper.   Yes [provider]  pregabalin  (LYRICA ) 200 MG capsule Take 200 mg by mouth 3 (three) times daily.    Yes [provider]  Probiotic Product (PROBIOTIC PO) Take 1 capsule by mouth daily.   Yes [provider]  Quercetin 500 MG CAPS Take 500 mg by mouth daily.   Yes [provider]  ranolazine  (RANEXA ) 1000 MG SR tablet Take 1 tablet (1,000 mg total) by mouth 2 (two) times daily. 05/14/24  Yes Wonda Sharper, MD  rosuvastatin  (CRESTOR ) 40 MG tablet Take 1 tablet (40 mg total) by mouth daily. Patient taking differently: Take 40 mg by mouth every evening. 07/18/23  Yes Burnard Debby LABOR, MD  spironolactone  (ALDACTONE ) 25 MG tablet TAKE 1/2 TABLET(12.5 MG) BY MOUTH DAILY 07/18/23  Yes Burnard Debby LABOR, MD  TURMERIC PO Take 5 mLs by mouth daily. Liquid supplement   Yes [provider]  Vitamin E 268 MG (400 UNIT) CAPS Take 268 mg by mouth daily.   Yes [provider]  Vitamins-Lipotropics (LIPOFLAVONOID PO) Take 100 mg by mouth 3 (three) times daily.   Yes [provider]    Scheduled Meds:  diltiazem   10 mg Intravenous Once   Continuous Infusions:  diltiazem  (CARDIZEM ) infusion  PRN Meds:   Allergies:    Allergies  Allergen Reactions   Nortriptyline Other (See Comments)    Groggy at low dose   Cymbalta [Duloxetine Hcl] Diarrhea    Abdominal Pain, GI Intolerance   Niacin  Other (See Comments)    Flushing     Social History:   Social History   Socioeconomic History   Marital status: Married    Spouse name: Not on file   Number of children: Not on file   Years of education: Not on file   Highest education level: Not on file  Occupational History   Not on file  Tobacco Use   Smoking status: Never   Smokeless tobacco: Never   Tobacco comments:    Never smoked 01/30/24  Vaping Use   Vaping status: Never Used  Substance and Sexual Activity   Alcohol use: Yes    Comment: occas   Drug use: Never   Sexual activity: Yes    Partners: Female    Comment: MARRIED  Other Topics Concern   Not on file  Social History Narrative   Are you right handed or left handed? right   Are you currently employed ? retired   Caffeine 1 soda a day    Do you live at home alone?wife      What type of home do you live in: 1 story or 2 story? two       Social Drivers of Corporate investment banker Strain: Low Risk  (01/21/2024)   Received from [facility]   Overall Financial Resource Strain (CARDIA)    Difficulty of Paying Living Expenses: Not very hard  Food Insecurity: No Food Insecurity (01/21/2024)   Received from [facility]   Hunger Vital Sign    Within the past 12 months, you worried that your food would run out before you got the money to buy more.: Never true    Within the past 12 months, the food you bought just didn't last and you didn't have money to get more.: Never true  Transportation Needs: No Transportation Needs (01/21/2024)   Received from [facility] - Transportation    Lack of Transportation (Medical): No    Lack of Transportation (Non-Medical): No  Physical Activity: Sufficiently Active (01/21/2024)   Received from [facility]   Exercise Vital Sign    On average, how many days per week do you engage in moderate to strenuous exercise (like a brisk walk)?: 3 days    On average, how many minutes do you engage in exercise at this level?: 90 min  Stress: No Stress Concern Present (01/21/2024)   Received from [facility] - Occupational Stress Questionnaire    Feeling of Stress : Only a little  Social Connections: Moderately Integrated (01/21/2024)   Received from [facility]   Social Network    How would you rate your social network (family, work, friends)?: Adequate participation with social networks  Intimate Partner Violence: Not At Risk (01/21/2024)   Received from [facility]   HITS    Over the last 12 months how often did your partner physically hurt you?: Never    Over the last 12 months how often did your partner insult you or talk down to you?: Never    Over the last 12 months how often did your partner threaten you with physical harm?:  Never    Over the last 12 months how often did your  partner scream or curse at you?: Never    Family History:    Family History  Problem Relation Age of Onset   Heart disease Father    Diabetes Father    Hyperlipidemia Sister      ROS:  Please see the history of present illness.   All other ROS reviewed and negative.     Physical Exam/Data: Vitals:   05/19/24 0852 05/19/24 1130  BP: 124/78 112/86  Pulse: (!) 109 (!) 108  Resp: 17 15  Temp: 98 F (36.7 C)   SpO2: 97% 98%   No intake or output data in the 24 hours ending 05/19/24 1319    05/12/2024    1:55 PM 03/26/2024    8:51 AM 02/18/2024    3:42 PM  Last 3 Weights  Weight (lbs) 205 lb 14.4 oz 200 lb 200 lb  Weight (kg) 93.396 kg 90.719 kg 90.719 kg     There is no height or weight on file to calculate BMI.  General:  Well nourished, well developed, in no acute distress HEENT: normal Neck: no JVD Vascular: No carotid bruits; Distal pulses 2+ bilaterally Cardiac:  normal S1, S2; RRR; no murmur  Lungs:  clear to auscultation bilaterally, no wheezing, rhonchi or rales  Abd: soft, nontender, no hepatomegaly  Ext: no edema Musculoskeletal:  No deformities, BUE and BLE strength normal and equal Skin: warm and dry  Neuro:  CNs 2-12 intact, no focal abnormalities noted Psych:  Normal affect   EKG:  The EKG was personally reviewed and demonstrates:   Telemetry:  Telemetry was personally reviewed and demonstrates:    Relevant CV Studies:   Laboratory Data: High Sensitivity Troponin:  No results for input(s): TROPONINIHS in the last 720 hours.   Chemistry Recent Labs  Lab 05/18/24 1304 05/19/24 0953  NA 134* 134*  K 3.8 4.5  CL 102 102  CO2 24 22  GLUCOSE 126* 120*  BUN 20 23  CREATININE 0.97 0.96  CALCIUM  9.2 8.9  MG  --  2.3  GFRNONAA >60 >60  ANIONGAP 8 10    No results for input(s): PROT, ALBUMIN, AST, ALT, ALKPHOS, BILITOT in the last 168 hours. Lipids No results for input(s): CHOL,  TRIG, HDL, LABVLDL, LDLCALC, CHOLHDL in the last 168 hours.  Hematology Recent Labs  Lab 05/18/24 1304 05/19/24 0953  WBC 5.7 6.8  RBC 4.95 4.96  HGB 14.5 14.8  HCT 43.9 44.7  MCV 88.7 90.1  MCH 29.3 29.8  MCHC 33.0 33.1  RDW 17.2* 17.3*  PLT 238 212   Thyroid   Recent Labs  Lab 05/19/24 0953 05/19/24 1106  TSH  --  1.139  FREET4 1.08  --     BNP Recent Labs  Lab 05/19/24 1104  BNP 100.3*    DDimer No results for input(s): DDIMER in the last 168 hours.  Radiology/Studies:  DG Chest 2 View Result Date: 05/18/2024 CLINICAL DATA:  Weakness EXAM: CHEST - 2 VIEW COMPARISON:  01/27/2024 FINDINGS: Prior CABG. Cardiomegaly. No confluent opacities, effusions or edema. No acute bony abnormality. Spinal stimulator with in place, unchanged. IMPRESSION: No active cardiopulmonary disease. Electronically Signed   By: Franky Crease M.D.   On: 05/18/2024 14:19     Assessment and Plan: Paroxysmal Atrial flutter Type 2 Diabetes Mellitus  Hyperlipidemia   He is symptomatic and in afib with controlled ventricular rate currently. He will benefit from rhythm control. He has not missed any doses of his anticoagulation therefore we can proceed with a  DCCV. Continue the Cardizem  gtt, coreg  3.125 mg BID and Eliquis  5 mg BID.   Informed Consent   Shared Decision Making/Informed Consent The risks (stroke, cardiac arrhythmias rarely resulting in the need for a temporary or permanent pacemaker, skin irritation or burns and complications associated with conscious sedation including aspiration, arrhythmia, respiratory failure and death), benefits (restoration of normal sinus rhythm) and alternatives of a direct current cardioversion were explained in detail to Mr. Halladay and he agrees to proceed.       Type 2 Diabetes Mellitus - per primary team   Hyperlipidemia - continue crestor    Risk Assessment/Risk Scores:         CHA2DS2-VASc Score = 3   This indicates a 3.2% annual risk of  stroke. The patient's score is based upon: CHF History: 0 HTN History: 0 Diabetes History: 0 Stroke History: 0 Vascular Disease History: 1 Age Score: 2 Gender Score: 0        For questions or updates, please contact [facility] Please consult [email or web address] for contact info under    Signed, Florence Antonelli, DO  05/19/2024 1:19 PM

## 2024-05-19 NOTE — ED Provider Notes (Signed)
 [place] EMERGENCY DEPARTMENT AT [facility] Provider Note   CSN: [id number] Arrival date & time: 05/19/24  9151     Patient presents with: Tachycardia and lethargic   Ruben [name] is a 78 y.o. male.   78 year old male with a history of CAD, atrial fibrillation on Eliquis  status post ablation, diabetes, and hyperlipidemia who presents to the emergency department with elevated heart rate.  Patient reports that since Saturday morning has had a heart rate that is been elevated to 110 bpm.  Since then has felt generally weak.  No chest pain or shortness of breath.  Says he has lemon drops on occasion but no other alcohol or caffeine use.  Came in yesterday and was in A-fib with an elevated rate of 110 and this improved with IV metoprolol .  He was given his home Coreg  and then discharged home.  Says that he has been compliant with his diltiazem  120 mg and Coreg  3.125 mg and actually took an additional 30 mg of diltiazem  which he has been instructed to do with elevated heart rate.       Prior to Admission medications   Medication Sig Start Date End Date Taking? Authorizing Provider  apixaban  (ELIQUIS ) 5 MG TABS tablet Take 1 tablet (5 mg total) by mouth 2 (two) times daily. 11/22/23  Yes Terra Fairy PARAS, PA-C  carvedilol  (COREG ) 3.125 MG tablet Take 1 tablet (3.125 mg total) by mouth in the morning, at noon, and at bedtime. Patient taking differently: Take 3.125 mg by mouth 2 (two) times daily. May take a third 3.125 mg dose as needed for rapid heart rate 11/19/23 05/19/24 Yes Terra Fairy PARAS, PA-C  Cholecalciferol (VITAMIN D3) 50 MCG (2000 UT) capsule Take 2,000 Units by mouth daily.   Yes [provider]  clopidogrel  (PLAVIX ) 75 MG tablet Take 1 tablet (75 mg total) by mouth daily. 07/18/23  Yes Burnard Debby LABOR, MD  diltiazem  (CARDIZEM ) 30 MG tablet Take 1 tablet every 4 hours AS NEEDED for AFIB heart rate >100 as long as top BP >100. 01/24/24  Yes Terra Fairy PARAS, PA-C   icosapent  Ethyl (VASCEPA ) 1 g capsule Take 2 capsules (2 g total) by mouth 2 (two) times daily. 07/22/23  Yes Burnard Debby LABOR, MD  IRON-VITAMIN C PO Take 1 tablet by mouth every Monday, Wednesday, and Friday.   Yes [provider]  isosorbide  mononitrate (IMDUR ) 60 MG 24 hr tablet TAKE 1 TABLET(60 MG) BY MOUTH DAILY 11/19/23  Yes Burnard Debby LABOR, MD  JARDIANCE  10 MG TABS tablet Take 10 mg by mouth daily.   Yes [provider]  losartan  (COZAAR ) 25 MG tablet Take 1 tablet (25 mg total) by mouth daily. Patient taking differently: Take 25 mg by mouth every evening. 07/18/23  Yes Burnard Debby LABOR, MD  MAGNESIUM PO Take 120 mg by mouth 2 (two) times daily.   Yes [provider]  Multiple Vitamin (MULTIVITAMIN WITH MINERALS) TABS tablet Take 1 tablet by mouth daily.   Yes [provider]  nitroGLYCERIN  (NITROLINGUAL ) 0.4 MG/SPRAY spray Place 1 spray under the tongue every 5 (five) minutes x 3 doses as needed for chest pain. 01/30/24  Yes Burnard Debby LABOR, MD  NUCYNTA  ER 200 MG TB12 Take 200 mg by mouth 2 (two) times daily. 12/05/17  Yes [provider]  OVER THE COUNTER MEDICATION Take 1 tablet by mouth daily. NUCLEZYME-FORTE supplement   Yes [provider]  OVER THE COUNTER MEDICATION Take 1 capsule by  mouth 2 (two) times daily. COCOAVIA supplement   Yes [provider]  pioglitazone  (ACTOS ) 45 MG tablet Take 45 mg by mouth daily before supper.   Yes [provider]  pregabalin  (LYRICA ) 200 MG capsule Take 200 mg by mouth 3 (three) times daily.    Yes [provider]  Probiotic Product (PROBIOTIC PO) Take 1 capsule by mouth daily.   Yes [provider]  Quercetin 500 MG CAPS Take 500 mg by mouth daily.   Yes [provider]  ranolazine  (RANEXA ) 1000 MG SR tablet Take 1 tablet (1,000 mg total) by mouth 2 (two) times daily. 05/14/24  Yes Wonda Sharper, MD  rosuvastatin  (CRESTOR ) 40 MG tablet Take 1 tablet (40  mg total) by mouth daily. Patient taking differently: Take 40 mg by mouth every evening. 07/18/23  Yes Burnard Debby LABOR, MD  spironolactone  (ALDACTONE ) 25 MG tablet TAKE 1/2 TABLET(12.5 MG) BY MOUTH DAILY 07/18/23  Yes Burnard Debby LABOR, MD  TURMERIC PO Take 5 mLs by mouth daily. Liquid supplement   Yes [provider]  Vitamin E 268 MG (400 UNIT) CAPS Take 268 mg by mouth daily.   Yes [provider]  Vitamins-Lipotropics (LIPOFLAVONOID PO) Take 100 mg by mouth 3 (three) times daily.   Yes [provider]    Allergies: Nortriptyline, Cymbalta [duloxetine hcl], and Niacin     Review of Systems  Updated Vital Signs BP 123/75   Pulse (!) 108   Temp 98.1 F (36.7 C) (Oral)   Resp 16   SpO2 100%   Physical Exam Vitals and nursing note reviewed.  Constitutional:      General: He is not in acute distress.    Appearance: He is well-developed.  HENT:     Head: Normocephalic and atraumatic.     Right Ear: External ear normal.     Left Ear: External ear normal.     Nose: Nose normal.  Eyes:     Extraocular Movements: Extraocular movements intact.     Conjunctiva/sclera: Conjunctivae normal.     Pupils: Pupils are equal, round, and reactive to light.  Cardiovascular:     Rate and Rhythm: Tachycardia present. Rhythm irregular.     Heart sounds: Normal heart sounds.  Pulmonary:     Effort: Pulmonary effort is normal. No respiratory distress.     Breath sounds: Normal breath sounds.  Musculoskeletal:     Cervical back: Normal range of motion and neck supple.     Right lower leg: No edema.     Left lower leg: No edema.  Skin:    General: Skin is warm and dry.  Neurological:     Mental Status: He is alert. Mental status is at baseline.  Psychiatric:        Mood and Affect: Mood normal.        Behavior: Behavior normal.     (all labs ordered are listed, but only abnormal results are displayed) Labs Reviewed  CBC WITH DIFFERENTIAL/PLATELET - Abnormal;  Notable for the following components:      Result Value   RDW 17.3 (*)    All other components within normal limits  BASIC METABOLIC PANEL WITH GFR - Abnormal; Notable for the following components:   Sodium 134 (*)    Glucose, Bld 120 (*)    All other components within normal limits  BRAIN NATRIURETIC PEPTIDE - Abnormal; Notable for the following components:   B Natriuretic Peptide 100.3 (*)    All other components within  normal limits  MAGNESIUM  T4, FREE  TSH    EKG: EKG Interpretation Date/Time:  Tuesday May 19 2024 12:10:06 EDT Ventricular Rate:  107 PR Interval:    QRS Duration:  113 QT Interval:  360 QTC Calculation: 481 R Axis:   -70  Text Interpretation: Atrial flutter/fibrillation Second deg AVB, Mobitz I (Wenckebach) Borderline IVCD with LAD Abnormal R-wave progression, late transition Inferior infarct, old Confirmed by Yolande Charleston [id number]) on 05/19/2024 12:48:48 PM  Radiology: DG Chest 2 View Result Date: 05/18/2024 CLINICAL DATA:  Weakness EXAM: CHEST - 2 VIEW COMPARISON:  01/27/2024 FINDINGS: Prior CABG. Cardiomegaly. No confluent opacities, effusions or edema. No acute bony abnormality. Spinal stimulator with in place, unchanged. IMPRESSION: No active cardiopulmonary disease. Electronically Signed   By: Franky Crease M.D.   On: 05/18/2024 14:19     Procedures   Medications Ordered in the ED  diltiazem  (CARDIZEM ) 1 mg/mL load via infusion 10 mg (10 mg Intravenous Bolus from Bag 05/19/24 1324)    And  diltiazem  (CARDIZEM ) 125 mg in dextrose  5% 125 mL (1 mg/mL) infusion (7.5 mg/hr Intravenous Rate/Dose Change 05/19/24 1404)  apixaban  (ELIQUIS ) tablet 5 mg (has no administration in time range)  isosorbide  mononitrate (IMDUR ) 24 hr tablet 60 mg (has no administration in time range)  losartan  (COZAAR ) tablet 25 mg (has no administration in time range)  ranolazine  (RANEXA ) 12 hr tablet 1,000 mg (has no administration in time range)  empagliflozin  (JARDIANCE )  tablet 10 mg (has no administration in time range)  rosuvastatin  (CRESTOR ) tablet 40 mg (has no administration in time range)  pioglitazone  (ACTOS ) tablet 45 mg (has no administration in time range)  pregabalin  (LYRICA ) capsule 200 mg (has no administration in time range)  clopidogrel  (PLAVIX ) tablet 75 mg (has no administration in time range)  spironolactone  (ALDACTONE ) tablet 12.5 mg (has no administration in time range)  Tapentadol  HCl TB12 200 mg (has no administration in time range)  sodium chloride  flush (NS) 0.9 % injection 3 mL (has no administration in time range)  acetaminophen  (TYLENOL ) tablet 650 mg (has no administration in time range)    Or  acetaminophen  (TYLENOL ) suppository 650 mg (has no administration in time range)  albuterol  (PROVENTIL ) (2.5 MG/3ML) 0.083% nebulizer solution 2.5 mg (has no administration in time range)    Clinical Course as of 05/19/24 1450  Tue May 19, 2024  1304 Dr Sheena from cardiology will see.  [RP]  1337 Dr Claudene from hospitalist consulted for admission [RP]    Clinical Course User Index [RP] Yolande Charleston BROCKS, MD                                 Medical Decision Making Amount and/or Complexity of Data Reviewed Labs: ordered.  Risk Prescription drug management. Decision regarding hospitalization.   78 year old male with a history of CAD, atrial fibrillation on Eliquis  status post ablation, diabetes, and hyperlipidemia who presents to the emergency department with elevated heart rate.  Initial Ddx:  Atrial fibrillation, atrial flutter, electrolyte abnormality, MI, CHF  MDM/Course:  Presents emergency department with generalized weakness.  Reports that he has been tachycardic since this weekend.  He says that he feels this way when he goes into atrial fib.  On the monitor does appear to be in atrial flutter with variable conduction.  Heart rate is less than 110 but is staying consistently at this level.  Suspect this is likely the cause  of  his symptoms.  Low concern for MI since he is not having chest pain or shortness of breath.  Is already anticoagulated making PE highly unlikely.  Is already on as needed diltiazem  as well as Coreg .  Discussed with cardiology since he is having refractory symptoms and they recommended admission to the hospital so they can consider cardioversion and evaluate the patient.  Started on diltiazem  drip and upon re-evaluation remained tachycardic.  Discussed with hospitalist for admission.  This patient presents to the ED for concern of complaints listed in HPI, this involves an extensive number of treatment options, and is a complaint that carries with it a high risk of complications and morbidity. Disposition including potential need for admission considered.   Dispo: Admit   Additional history obtained from spouse Records reviewed ED Visit Notes The following labs were independently interpreted: Chemistry and show no acute abnormality I personally reviewed and interpreted cardiac monitoring: Atrial flutter RVR I personally reviewed and interpreted the pt's EKG: see above for interpretation  I have reviewed the patients home medications and made adjustments as needed Consults: Cardiology and Hospitalist Social Determinants of health:  Geriatric  CRITICAL CARE Performed by: Lamar JAYSON Shan   Total critical care time: 30 minutes  Critical care time was exclusive of separately billable procedures and treating other patients.  Critical care was necessary to treat or prevent imminent or life-threatening deterioration.  Critical care was time spent personally by me on the following activities: development of treatment plan with patient and/or surrogate as well as nursing, discussions with consultants, evaluation of patient's response to treatment, examination of patient, obtaining history from patient or surrogate, ordering and performing treatments and interventions, ordering and review of  laboratory studies, ordering and review of radiographic studies, pulse oximetry and re-evaluation of patient's condition.   Portions of this note were generated with Scientist, clinical (histocompatibility and immunogenetics). Dictation errors may occur despite best attempts at proofreading.     Final diagnoses:  Atrial flutter with rapid ventricular response [facility])    ED Discharge Orders     None          Shan Lamar JAYSON, MD 05/19/24 1450

## 2024-05-19 NOTE — Telephone Encounter (Signed)
 Attempted phone call to patient.  Per Epic pt is currently in ED.

## 2024-05-19 NOTE — ED Notes (Signed)
 Pt transported up to floor with all belongings

## 2024-05-19 NOTE — Consult Note (Signed)
 Cardiology Consultation   Patient ID: Ruben [name] MRN: [id number]; DOB: 08-07-1946  Admit date: 05/19/2024 Date of Consult: 05/19/2024  PCP:  Regino Slater, MD   [facility] Providers Cardiologist:  Debby Sor, MD (Inactive)  Electrophysiologist:  Eulas FORBES Furbish, MD       Patient Profile: Ruben [name] is a 78 y.o. male with a hx of atrial flutter status post ablation in 2018 and most recent ablation in July 2025, coronary artery disease status post CABG and PCI, diabetes mellitus, dyslipidemia and hypertension who is being seen 05/19/2024 for the evaluation of symptomatic atrial flutter at the request of Dr. Jakie.  History of Present Illness: Ruben [name] presented to the emergency department yesterday for persistent tachycardia and reported that he has been experiencing palpitations and a persistent heart rate over 100 beats per minute since Saturday morning, as detected by his Apple Watch. e describes feeling very sluggish and lethargic, with no lightheadedness or dizziness, but a general sense of unwellness and lack of desire to engage in activities.   He in the ED he was giving metoprolol  which help decrease his heart rate and was discharged to home.   He still feels fatigue back here is the ED.   Past Medical History:  Diagnosis Date   Atrial flutter (HCC) 02/02/2017   CAD, multiple vessel    Diabetes mellitus (HCC)    Dyslipidemia    Hypertension     Past Surgical History:  Procedure Laterality Date   A-FLUTTER ABLATION N/A 02/05/2017   Procedure: A-Flutter Ablation;  Surgeon: Kelsie Agent, MD;  Location: [facility] INVASIVE CV LAB;  Service: Cardiovascular;  Laterality: N/A;   A-FLUTTER ABLATION N/A 03/26/2024   Procedure: A-FLUTTER ABLATION;  Surgeon: Furbish Eulas FORBES, MD;  Location: [facility] INVASIVE CV LAB;  Service: Cardiovascular;  Laterality: N/A;   CARDIAC CATHETERIZATION  12/13/2011   No intervention - recommend medical therapy with increased medical  trial   CARDIOVASCULAR STRESS TEST  10/26/2010   No scintigraphic evidence of inducible myocardial ischemia. No ECG changes. EKG negative for ischemia.   CORONARY ARTERY BYPASS GRAFT  1997   LIMA-LAD, vein graft to diagonal, vein graft to obtuse marginal, and vein graft to RCA   LEFT HEART CATH AND CORS/GRAFTS ANGIOGRAPHY N/A 09/29/2018   Procedure: LEFT HEART CATH AND CORS/GRAFTS ANGIOGRAPHY;  Surgeon: Anner Alm ORN, MD;  Location: [facility] INVASIVE CV LAB;  Service: Cardiovascular;  Laterality: N/A;   LEFT HEART CATHETERIZATION WITH CORONARY/GRAFT ANGIOGRAM N/A 12/13/2011   Procedure: LEFT HEART CATHETERIZATION WITH EL BILE;  Surgeon: Debby DELENA Sor, MD;  Location: [facility] CATH LAB;  Service: Cardiovascular;  Laterality: N/A;   LOWER EXTREMITY ARTERIAL DOPPLER  01/30/2000   Normal arterial study   TEE WITHOUT CARDIOVERSION N/A 02/04/2017   Procedure: TRANSESOPHAGEAL ECHOCARDIOGRAM (TEE);  Surgeon: Maranda Leim DEL, MD;  Location: [facility] ENDOSCOPY;  Service: Cardiovascular;  Laterality: N/A;   TRANSTHORACIC ECHOCARDIOGRAM  12/12/2011   EF >55%, LA severely dilated.   ULTRASOUND GUIDANCE FOR VASCULAR ACCESS  09/29/2018   Procedure: Ultrasound Guidance For Vascular Access;  Surgeon: Anner Alm ORN, MD;  Location: [facility] INVASIVE CV LAB;  Service: Cardiovascular;;     Home Medications:  Prior to Admission medications   Medication Sig Start Date End Date Taking? Authorizing Provider  apixaban  (ELIQUIS ) 5 MG TABS tablet Take 1 tablet (5 mg total) by mouth 2 (two) times daily. 11/22/23  Yes Terra Fairy PARAS, PA-C  carvedilol  (COREG ) 3.125 MG tablet Take 1 tablet (3.125 mg total)  by mouth in the morning, at noon, and at bedtime. Patient taking differently: Take 3.125 mg by mouth 2 (two) times daily. May take a third 3.125 mg dose as needed for rapid heart rate 11/19/23 05/19/24 Yes Terra Fairy PARAS, PA-C  Cholecalciferol (VITAMIN D3) 50 MCG (2000 UT) capsule Take 2,000 Units by mouth daily.   Yes [provider]  clopidogrel  (PLAVIX ) 75 MG tablet Take 1 tablet (75 mg total) by mouth daily. 07/18/23  Yes Burnard Debby LABOR, MD  diltiazem  (CARDIZEM ) 30 MG tablet Take 1 tablet every 4 hours AS NEEDED for AFIB heart rate >100 as long as top BP >100. 01/24/24  Yes Terra Fairy PARAS, PA-C  icosapent  Ethyl (VASCEPA ) 1 g capsule Take 2 capsules (2 g total) by mouth 2 (two) times daily. 07/22/23  Yes Burnard Debby LABOR, MD  IRON-VITAMIN C PO Take 1 tablet by mouth every Monday, Wednesday, and Friday.   Yes [provider]  isosorbide  mononitrate (IMDUR ) 60 MG 24 hr tablet TAKE 1 TABLET(60 MG) BY MOUTH DAILY 11/19/23  Yes Burnard Debby LABOR, MD  JARDIANCE  10 MG TABS tablet Take 10 mg by mouth daily.   Yes [provider]  losartan  (COZAAR ) 25 MG tablet Take 1 tablet (25 mg total) by mouth daily. Patient taking differently: Take 25 mg by mouth every evening. 07/18/23  Yes Burnard Debby LABOR, MD  MAGNESIUM PO Take 120 mg by mouth 2 (two) times daily.   Yes [provider]  Multiple Vitamin (MULTIVITAMIN WITH MINERALS) TABS tablet Take 1 tablet by mouth daily.   Yes [provider]  nitroGLYCERIN  (NITROLINGUAL ) 0.4 MG/SPRAY spray Place 1 spray under the tongue every 5 (five) minutes x 3 doses as needed for chest pain. 01/30/24  Yes Burnard Debby LABOR, MD  NUCYNTA  ER 200 MG TB12 Take 200 mg by mouth 2 (two) times daily. 12/05/17  Yes [provider]  OVER THE COUNTER MEDICATION Take 1 tablet by mouth daily. NUCLEZYME-FORTE supplement   Yes [provider]  OVER THE COUNTER MEDICATION Take 1 capsule by mouth 2 (two) times daily. COCOAVIA supplement   Yes [provider]  pioglitazone  (ACTOS ) 45 MG tablet Take 45 mg by mouth daily before supper.   Yes [provider]  pregabalin  (LYRICA ) 200 MG capsule Take 200 mg by mouth 3 (three) times daily.    Yes [provider]  Probiotic Product (PROBIOTIC PO) Take 1 capsule by mouth daily.   Yes [provider]  Quercetin 500 MG CAPS Take 500 mg by mouth daily.   Yes [provider]  ranolazine  (RANEXA ) 1000 MG SR tablet Take 1 tablet (1,000 mg total) by mouth 2 (two) times daily. 05/14/24  Yes Wonda Sharper, MD  rosuvastatin  (CRESTOR ) 40 MG tablet Take 1 tablet (40 mg total) by mouth daily. Patient taking differently: Take 40 mg by mouth every evening. 07/18/23  Yes Burnard Debby LABOR, MD  spironolactone  (ALDACTONE ) 25 MG tablet TAKE 1/2 TABLET(12.5 MG) BY MOUTH DAILY 07/18/23  Yes Burnard Debby LABOR, MD  TURMERIC PO Take 5 mLs by mouth daily. Liquid supplement   Yes [provider]  Vitamin E 268 MG (400 UNIT) CAPS Take 268 mg by mouth daily.   Yes [provider]  Vitamins-Lipotropics (LIPOFLAVONOID PO) Take 100 mg by mouth 3 (three) times daily.   Yes [provider]    Scheduled Meds:  diltiazem   10 mg Intravenous Once   Continuous Infusions:  diltiazem  (CARDIZEM ) infusion  PRN Meds:   Allergies:    Allergies  Allergen Reactions   Nortriptyline Other (See Comments)    Groggy at low dose   Cymbalta [Duloxetine Hcl] Diarrhea    Abdominal Pain, GI Intolerance   Niacin  Other (See Comments)    Flushing     Social History:   Social History   Socioeconomic History   Marital status: Married    Spouse name: Not on file   Number of children: Not on file   Years of education: Not on file   Highest education level: Not on file  Occupational History   Not on file  Tobacco Use   Smoking status: Never   Smokeless tobacco: Never   Tobacco comments:    Never smoked 01/30/24  Vaping Use   Vaping status: Never Used  Substance and Sexual Activity   Alcohol use: Yes    Comment: occas   Drug use: Never   Sexual activity: Yes    Partners: Female    Comment: MARRIED  Other Topics Concern   Not on file  Social History Narrative   Are you right handed or left handed? right   Are you currently employed ? retired   Caffeine 1 soda a day    Do you live at home alone?wife      What type of home do you live in: 1 story or 2 story? two       Social Drivers of Corporate investment banker Strain: Low Risk  (01/21/2024)   Received from [facility]   Overall Financial Resource Strain (CARDIA)    Difficulty of Paying Living Expenses: Not very hard  Food Insecurity: No Food Insecurity (01/21/2024)   [facility] from [facility]   Hunger Vital Sign    Within the past 12 months, you worried that your food would run out before you got the money to buy more.: Never true    Within the past 12 months, the food you bought just didn't last and you didn't have money to get more.: Never true  Transportation Needs: No Transportation Needs (01/21/2024)   Received from [facility] - Transportation    Lack of Transportation (Medical): No    Lack of Transportation (Non-Medical): No  Physical Activity: Sufficiently Active (01/21/2024)   Received from [facility]   Exercise Vital Sign    On average, how many days per week do you engage in moderate to strenuous exercise (like a brisk walk)?: 3 days    On average, how many minutes do you engage in exercise at this level?: 90 min  Stress: No Stress Concern Present (01/21/2024)   Received from [facility] - Occupational Stress Questionnaire    Feeling of Stress : Only a little  Social Connections: Moderately Integrated (01/21/2024)   Received from [facility]   Social Network    How would you rate your social network (family, work, friends)?: Adequate participation with social networks  Intimate Partner Violence: Not At Risk (01/21/2024)   Received from [facility]   HITS    Over the last 12 months how often did your partner physically hurt you?: Never    Over the last 12 months how often did your partner insult you or talk down to you?: Never    Over the last 12 months how often did your partner threaten you with physical harm?:  Never    Over the last 12 months how often did your  partner scream or curse at you?: Never    Family History:    Family History  Problem Relation Age of Onset   Heart disease Father    Diabetes Father    Hyperlipidemia Sister      ROS:  Please see the history of present illness.   All other ROS reviewed and negative.     Physical Exam/Data: Vitals:   05/19/24 0852 05/19/24 1130  BP: 124/78 112/86  Pulse: (!) 109 (!) 108  Resp: 17 15  Temp: 98 F (36.7 C)   SpO2: 97% 98%   No intake or output data in the 24 hours ending 05/19/24 1319    05/12/2024    1:55 PM 03/26/2024    8:51 AM 02/18/2024    3:42 PM  Last 3 Weights  Weight (lbs) 205 lb 14.4 oz 200 lb 200 lb  Weight (kg) 93.396 kg 90.719 kg 90.719 kg     There is no height or weight on file to calculate BMI.  General:  Well nourished, well developed, in no acute distress HEENT: normal Neck: no JVD Vascular: No carotid bruits; Distal pulses 2+ bilaterally Cardiac:  normal S1, S2; RRR; no murmur  Lungs:  clear to auscultation bilaterally, no wheezing, rhonchi or rales  Abd: soft, nontender, no hepatomegaly  Ext: no edema Musculoskeletal:  No deformities, BUE and BLE strength normal and equal Skin: warm and dry  Neuro:  CNs 2-12 intact, no focal abnormalities noted Psych:  Normal affect   EKG:  The EKG was personally reviewed and demonstrates:   Telemetry:  Telemetry was personally reviewed and demonstrates:    Relevant CV Studies:   Laboratory Data: High Sensitivity Troponin:  No results for input(s): TROPONINIHS in the last 720 hours.   Chemistry Recent Labs  Lab 05/18/24 1304 05/19/24 0953  NA 134* 134*  K 3.8 4.5  CL 102 102  CO2 24 22  GLUCOSE 126* 120*  BUN 20 23  CREATININE 0.97 0.96  CALCIUM  9.2 8.9  MG  --  2.3  GFRNONAA >60 >60  ANIONGAP 8 10    No results for input(s): PROT, ALBUMIN, AST, ALT, ALKPHOS, BILITOT in the last 168 hours. Lipids No results for input(s): CHOL,  TRIG, HDL, LABVLDL, LDLCALC, CHOLHDL in the last 168 hours.  Hematology Recent Labs  Lab 05/18/24 1304 05/19/24 0953  WBC 5.7 6.8  RBC 4.95 4.96  HGB 14.5 14.8  HCT 43.9 44.7  MCV 88.7 90.1  MCH 29.3 29.8  MCHC 33.0 33.1  RDW 17.2* 17.3*  PLT 238 212   Thyroid   Recent Labs  Lab 05/19/24 0953 05/19/24 1106  TSH  --  1.139  FREET4 1.08  --     BNP Recent Labs  Lab 05/19/24 1104  BNP 100.3*    DDimer No results for input(s): DDIMER in the last 168 hours.  Radiology/Studies:  DG Chest 2 View Result Date: 05/18/2024 CLINICAL DATA:  Weakness EXAM: CHEST - 2 VIEW COMPARISON:  01/27/2024 FINDINGS: Prior CABG. Cardiomegaly. No confluent opacities, effusions or edema. No acute bony abnormality. Spinal stimulator with in place, unchanged. IMPRESSION: No active cardiopulmonary disease. Electronically Signed   By: Franky Crease M.D.   On: 05/18/2024 14:19     Assessment and Plan: Paroxysmal Atrial flutter Type 2 Diabetes Mellitus  Hyperlipidemia   He is symptomatic and in afib with controlled ventricular rate currently. He will benefit from rhythm control. He has not missed any doses of his anticoagulation therefore we can proceed with a  DCCV. Continue the Cardizem  gtt, coreg  3.125 mg BID and Eliquis  5 mg BID.   Informed Consent   Shared Decision Making/Informed Consent The risks (stroke, cardiac arrhythmias rarely resulting in the need for a temporary or permanent pacemaker, skin irritation or burns and complications associated with conscious sedation including aspiration, arrhythmia, respiratory failure and death), benefits (restoration of normal sinus rhythm) and alternatives of a direct current cardioversion were explained in detail to Mr. Halladay and he agrees to proceed.       Type 2 Diabetes Mellitus - per primary team   Hyperlipidemia - continue crestor    Risk Assessment/Risk Scores:         CHA2DS2-VASc Score = 3   This indicates a 3.2% annual risk of  stroke. The patient's score is based upon: CHF History: 0 HTN History: 0 Diabetes History: 0 Stroke History: 0 Vascular Disease History: 1 Age Score: 2 Gender Score: 0        For questions or updates, please contact [facility] Please consult [email or web address] for contact info under    Signed, Florence Antonelli, DO  05/19/2024 1:19 PM

## 2024-05-20 ENCOUNTER — Other Ambulatory Visit: Payer: Self-pay

## 2024-05-20 ENCOUNTER — Observation Stay (HOSPITAL_BASED_OUTPATIENT_CLINIC_OR_DEPARTMENT_OTHER): Admitting: Anesthesiology

## 2024-05-20 ENCOUNTER — Encounter (HOSPITAL_COMMUNITY)
Admission: EM | Disposition: A | Discharge status: Home / Self Care | Payer: Self-pay | Source: Home / Self Care | Attending: Emergency Medicine

## 2024-05-20 ENCOUNTER — Encounter (HOSPITAL_COMMUNITY): Payer: Self-pay | Admitting: Internal Medicine

## 2024-05-20 ENCOUNTER — Observation Stay (HOSPITAL_COMMUNITY): Admitting: Anesthesiology

## 2024-05-20 DIAGNOSIS — E119 Type 2 diabetes mellitus without complications: Secondary | ICD-10-CM

## 2024-05-20 DIAGNOSIS — E1140 Type 2 diabetes mellitus with diabetic neuropathy, unspecified: Secondary | ICD-10-CM | POA: Diagnosis not present

## 2024-05-20 DIAGNOSIS — I4891 Unspecified atrial fibrillation: Secondary | ICD-10-CM | POA: Diagnosis not present

## 2024-05-20 DIAGNOSIS — F1090 Alcohol use, unspecified, uncomplicated: Secondary | ICD-10-CM | POA: Diagnosis not present

## 2024-05-20 DIAGNOSIS — I2510 Atherosclerotic heart disease of native coronary artery without angina pectoris: Secondary | ICD-10-CM | POA: Diagnosis not present

## 2024-05-20 DIAGNOSIS — I10 Essential (primary) hypertension: Secondary | ICD-10-CM

## 2024-05-20 DIAGNOSIS — E782 Mixed hyperlipidemia: Secondary | ICD-10-CM | POA: Diagnosis not present

## 2024-05-20 DIAGNOSIS — I4892 Unspecified atrial flutter: Secondary | ICD-10-CM | POA: Diagnosis not present

## 2024-05-20 DIAGNOSIS — I480 Paroxysmal atrial fibrillation: Secondary | ICD-10-CM | POA: Diagnosis not present

## 2024-05-20 HISTORY — PX: CARDIOVERSION: EP1203

## 2024-05-20 LAB — BASIC METABOLIC PANEL WITH GFR
Anion gap: 8 (ref 5–15)
BUN: 23 mg/dL (ref 8–23)
CO2: 23 mmol/L (ref 22–32)
Calcium: 8.9 mg/dL (ref 8.9–10.3)
Chloride: 104 mmol/L (ref 98–111)
Creatinine, Ser: 0.93 mg/dL (ref 0.61–1.24)
GFR, Estimated: >60 mL/min (ref >60)
Glucose, Bld: 121 mg/dL — ABNORMAL HIGH (ref 70–99)
Potassium: 4.2 mmol/L (ref 3.5–5.1)
Sodium: 135 mmol/L (ref 135–145)

## 2024-05-20 LAB — CBC
HCT: 43.4 % (ref 39.0–52.0)
Hemoglobin: 14.8 g/dL (ref 13.0–17.0)
MCH: 29.9 pg (ref 26.0–34.0)
MCHC: 34.1 g/dL (ref 30.0–36.0)
MCV: 87.7 fL (ref 80.0–100.0)
Platelets: 210 K/uL (ref 150–400)
RBC: 4.95 MIL/uL (ref 4.22–5.81)
RDW: 17.2 % — ABNORMAL HIGH (ref 11.5–15.5)
WBC: 5.7 K/uL (ref 4.0–10.5)
nRBC: 0 % (ref 0.0–0.2)

## 2024-05-20 SURGERY — CARDIOVERSION (CATH LAB)
Anesthesia: General

## 2024-05-20 MED ORDER — LIDOCAINE 2% (20 MG/ML) 5 ML SYRINGE
INTRAMUSCULAR | Status: DC | PRN
  Administered 2024-05-20: 50 mg via INTRAVENOUS

## 2024-05-20 MED ORDER — DILTIAZEM HCL ER COATED BEADS 120 MG PO CP24
120.0000 mg | ORAL_CAPSULE | Freq: Every day | ORAL | Status: DC
  Administered 2024-05-20: 120 mg via ORAL
  Filled 2024-05-20: qty 1

## 2024-05-20 MED ORDER — PROPOFOL 10 MG/ML IV BOLUS
INTRAVENOUS | Status: DC | PRN
  Administered 2024-05-20: 20 mg via INTRAVENOUS
  Administered 2024-05-20: 30 mg via INTRAVENOUS
  Administered 2024-05-20: 50 mg via INTRAVENOUS

## 2024-05-20 MED ORDER — DILTIAZEM HCL ER COATED BEADS 120 MG PO CP24: 120.0000 mg | ORAL_CAPSULE | Freq: Every day | ORAL | 2 refills | Status: DC

## 2024-05-20 SURGICAL SUPPLY — 1 items: PAD DEFIB RADIO PHYSIO CONN (PAD) ×1 IMPLANT

## 2024-05-20 NOTE — TOC CM/SW Note (Signed)
 Transition of Care [facility]) - Inpatient Brief Assessment   Patient Details  Name: ORVAL DORTCH MRN: [id number] Date of Birth: 07-13-1946  [facility] CM/SW Contact:    Lauraine FORBES Saa, LCSWA Phone Number: 05/20/2024, 9:10 AM   Clinical Narrative:  9:10 AM Per chart review, patient resides at home with spouse. Patient has a PCP and insurance. Patient does not have SNF/HH/DME history. Patient's preferred pharmacy's are [facility], [facility] [phone] [place], and [facility] [place]. No TOC needs were identified at this time. TOC will continue to follow and be available to assist.  Transition of Care Asessment: Insurance and Status: Insurance coverage has been reviewed Patient has primary care physician: Yes Home environment has been reviewed: Private Residence Prior level of function:: N/A Prior/Current Home Services: No current home services Social Drivers of Health Review: SDOH reviewed no interventions necessary Readmission risk has been reviewed: Yes (Currently Observation Status) Transition of care needs: no transition of care needs at this time

## 2024-05-20 NOTE — Anesthesia Preprocedure Evaluation (Signed)
 Anesthesia Evaluation  Patient identified by MRN, date of birth, ID band Patient awake    Reviewed: Allergy & Precautions, H&P , NPO status , Patient's Chart, lab work & pertinent test results, reviewed documented beta blocker date and time   Airway Mallampati: III  TM Distance: >3 FB Neck ROM: Full    Dental  (+) Teeth Intact, Dental Advisory Given   Pulmonary  Snores at night, possible apneas, has not had sleep study    Pulmonary exam normal breath sounds clear to auscultation       Cardiovascular hypertension, Pt. on medications and Pt. on home beta blockers + CAD  Normal cardiovascular exam+ dysrhythmias (eliquis ) Atrial Fibrillation + Valvular Problems/Murmurs (mild-mod AS, mild AI) AI and AS  Rhythm:Irregular Rate:Normal  Echo 2025 1. Left ventricular ejection fraction, by estimation, is 60 to 65%. Left  ventricular ejection fraction by 3D volume is 67 %. The left ventricle has  normal function. The left ventricle has no regional wall motion  abnormalities. There is mild concentric  left ventricular hypertrophy. Left ventricular diastolic parameters were  normal.   2. Right ventricular systolic function is normal. The right ventricular  size is normal.   3. Left atrial size was mildly dilated.   4. The mitral valve is normal in structure. Trivial mitral valve  regurgitation. No evidence of mitral stenosis.   5. The aortic valve is tricuspid. There is mild calcification of the  aortic valve. Aortic valve regurgitation is mild. Mild to moderate aortic  valve stenosis. Aortic regurgitation PHT measures 407 msec. Aortic valve  area, by VTI measures 1.88 cm.  Aortic valve mean gradient measures 9.6 mmHg. Aortic valve Vmax measures  2.38 m/s.   6. Aortic dilatation noted. There is borderline dilatation of the  ascending aorta, measuring 40 mm.   7. The inferior vena cava is normal in size with greater than 50%  respiratory  variability, suggesting right atrial pressure of 3 mmHg.     Neuro/Psych negative neurological ROS  negative psych ROS   GI/Hepatic negative GI ROS, Neg liver ROS,,,  Endo/Other  diabetes, Well Controlled, Type 2  BMI 31  Renal/GU negative Renal ROS  negative genitourinary   Musculoskeletal Chronic pain- nucynta    Abdominal   Peds negative pediatric ROS (+)  Hematology negative hematology ROS (+)   Anesthesia Other Findings   Reproductive/Obstetrics negative OB ROS                              Anesthesia Physical Anesthesia Plan  ASA: 3  Anesthesia Plan: General   Post-op Pain Management:    Induction: Intravenous  PONV Risk Score and Plan: TIVA and Treatment may vary due to age or medical condition  Airway Management Planned: Natural Airway and Mask  Additional Equipment: None  Intra-op Plan:   Post-operative Plan:   Informed Consent: I have reviewed the patients History and Physical, chart, labs and discussed the procedure including the risks, benefits and alternatives for the proposed anesthesia with the patient or authorized representative who has indicated his/her understanding and acceptance.       Plan Discussed with: CRNA  Anesthesia Plan Comments:         Anesthesia Quick Evaluation

## 2024-05-20 NOTE — Care Management Obs Status (Signed)
 MEDICARE OBSERVATION STATUS NOTIFICATION   Patient Details  Name: Ruben [name] MRN: [id number] Date of Birth: 05/25/1946   Medicare Observation Status Notification Given:  Yes  Obs letter signed and a copy given  Claretta Deed 05/20/2024, 12:57 PM

## 2024-05-20 NOTE — CV Procedure (Signed)
 Procedure:   DCCV  Indication:  Symptomatic atrial fibrillation  Procedure Note:  The patient signed informed consent.  They have had had therapeutic anticoagulation with apixaban  greater than 3 weeks.  Anesthesia was administered by Dr. Merla.  Patient received 50 mg IV lidocaine  and 100 mg IV propofol .Adequate airway was maintained throughout and vital followed per protocol.  They were cardioverted x 1 with 200J of biphasic synchronized energy.  They converted to sinus bradycardia.  There were no apparent complications.  The patient had normal neuro status and respiratory status post procedure with vitals stable as recorded elsewhere.    Follow up:  They will continue on current medical therapy and follow up with cardiology as scheduled.  Shelda Bruckner, MD PhD 05/20/2024 10:08 AM

## 2024-05-20 NOTE — Plan of Care (Signed)
  Problem: Education: Goal: Knowledge of General Education information will improve Description: Including pain rating scale, medication(s)/side effects and non-pharmacologic comfort measures Outcome: Progressing   Problem: Health Behavior/Discharge Planning: Goal: Ability to manage health-related needs will improve Outcome: Progressing   Problem: Clinical Measurements: Goal: Ability to maintain clinical measurements within normal limits will improve Outcome: Progressing Goal: Diagnostic test results will improve Outcome: Progressing   Problem: Activity: Goal: Risk for activity intolerance will decrease Outcome: Progressing   Problem: Nutrition: Goal: Adequate nutrition will be maintained Outcome: Progressing   Problem: Coping: Goal: Level of anxiety will decrease Outcome: Progressing   Problem: Elimination: Goal: Will not experience complications related to bowel motility Outcome: Progressing   Problem: Pain Managment: Goal: General experience of comfort will improve and/or be controlled Outcome: Progressing

## 2024-05-20 NOTE — TOC Transition Note (Signed)
 Transition of Care [facility]) - Discharge Note   Patient Details  Name: Ruben [name] MRN: [id number] Date of Birth: 09/22/1946  [name] CM/SW Contact:  Andrez JULIANNA George, RN Phone Number: 05/20/2024, 1:46 PM   Clinical Narrative:     Pt is discharging home with self care. No needs per IP Care management.   Final next level of care: Home/Self Care Barriers to Discharge: No Barriers Identified   Patient Goals and CMS Choice            Discharge Placement                       Discharge Plan and Services Additional resources added to the After Visit Summary for                                       Social Drivers of Health (SDOH) Interventions SDOH Screenings   Food Insecurity: No Food Insecurity (05/19/2024)  Housing: Low Risk  (05/19/2024)  Transportation Needs: No Transportation Needs (05/19/2024)  Utilities: Not At Risk (05/19/2024)  Financial Resource Strain: Low Risk  (01/21/2024)   Received from [facility]  Physical Activity: Sufficiently Active (01/21/2024)   Received from [facility]  Social Connections: Unknown (05/19/2024)  Stress: No Stress Concern Present (01/21/2024)   Received from [facility]  Tobacco Use: Low Risk  (05/20/2024)     Readmission Risk Interventions     No data to display

## 2024-05-20 NOTE — Interval H&P Note (Signed)
 History and Physical Interval Note:  05/20/2024 9:40 AM  Ruben [name]  has presented today for surgery, with the diagnosis of afib.  The various methods of treatment have been discussed with the patient and family. After consideration of risks, benefits and other options for treatment, the patient has consented to  Procedure(s): CARDIOVERSION (N/A) as a surgical intervention.  The patient's history has been reviewed, patient examined, no change in status, stable for surgery.  I have reviewed the patient's chart and labs.  Questions were answered to the patient's satisfaction.     Kevontae Burgoon Lonni

## 2024-05-20 NOTE — Discharge Summary (Signed)
 Physician Discharge Summary   Patient: Ruben [name] MRN: [id number] DOB: May 31, 1946  Admit date:     05/19/2024  Discharge date: 05/20/2024  Discharge Physician: Concepcion Riser   PCP: Regino Slater, MD   Recommendations at discharge:    PCP follow up in 1 week. Cardiology follow up as scheduled.  Discharge Diagnoses: Principal Problem:   Paroxysmal atrial flutter (HCC) Active Problems:   CAD (coronary artery disease)   DM2 (diabetes mellitus, type 2) (HCC)   Essential hypertension   Mixed hyperlipidemia  Resolved Problems:   * No resolved hospital problems. [employer] Course: Ruben [name] is a 78 y.o. male with a hx of atrial flutter status post ablation in 2018 and most recent ablation in July 2025, coronary artery disease status post CABG and PCI, diabetes mellitus, dyslipidemia and hypertension presented with lethargy noted to have HR 130 admitted to TR[facility] service for cardiology evaluation of symptomatic atrial flutter.  Assessment and Plan: Atrial fibrillation/ flutter with RVR- Was started on Cardizem  drip. Seen by Cardiology.  S/p DCCV with transition to sinus with one shock. Cardiology advised Cardizem  120mg  daily, continue Eliquis .  CAD S/p prior CABG and PCI.  Patient denies any complaints of chest pain at this time. Continue Crestor , losartan , Ranexa , and isosorbide  mononitrate   Controlled diabetes mellitus type 2 Last available hemoglobin A1c noted to be 5.7 when checked on 03/23/2024 Continue Jardiance  and Actos    Essential hypertension Blood pressures currently maintained. Continue losartan  and spironolactone , titration as per cardiology.   Hyperlipidemia Continue Crestor         Consultants: Cardiology Procedures performed: DCCV  Disposition: Home Diet recommendation:  Discharge Diet Orders (From admission, onward)     Start     Ordered   05/20/24 0000  Diet - low sodium heart healthy        05/20/24 1326   05/20/24 0000  Diet  Carb Modified        05/20/24 1326           Cardiac diet DISCHARGE MEDICATION: Allergies as of 05/20/2024       Reactions   Nortriptyline Other (See Comments)   Groggy at low dose   Cymbalta [duloxetine Hcl] Diarrhea   Abdominal Pain, GI Intolerance   Niacin  Other (See Comments)   Flushing         Medication List     STOP taking these medications    carvedilol  3.125 MG tablet Commonly known as: COREG    diltiazem  30 MG tablet Commonly known as: Cardizem        TAKE these medications    apixaban  5 MG Tabs tablet Commonly known as: Eliquis  Take 1 tablet (5 mg total) by mouth 2 (two) times daily.   clopidogrel  75 MG tablet Commonly known as: PLAVIX  Take 1 tablet (75 mg total) by mouth daily.   diltiazem  120 MG 24 hr capsule Commonly known as: CARDIZEM  CD Take 1 capsule (120 mg total) by mouth daily. Start taking on: May 21, 2024   icosapent  Ethyl 1 g capsule Commonly known as: Vascepa  Take 2 capsules (2 g total) by mouth 2 (two) times daily.   IRON-VITAMIN C PO Take 1 tablet by mouth every Monday, Wednesday, and Friday.   isosorbide  mononitrate 60 MG 24 hr tablet Commonly known as: IMDUR  TAKE 1 TABLET(60 MG) BY MOUTH DAILY   Jardiance  10 MG Tabs tablet Generic drug: empagliflozin  Take 10 mg by mouth daily.   LIPOFLAVONOID PO Take 100 mg by mouth 3 (three) times  daily.   losartan  25 MG tablet Commonly known as: COZAAR  Take 1 tablet (25 mg total) by mouth daily. What changed: when to take this   MAGNESIUM PO Take 120 mg by mouth 2 (two) times daily.   multivitamin with minerals Tabs tablet Take 1 tablet by mouth daily.   nitroGLYCERIN  0.4 MG/SPRAY spray Commonly known as: Nitrolingual  Place 1 spray under the tongue every 5 (five) minutes x 3 doses as needed for chest pain.   Nucynta  ER 200 MG Tb12 Generic drug: Tapentadol  HCl Take 200 mg by mouth 2 (two) times daily.   OVER THE COUNTER MEDICATION Take 1 tablet by mouth daily.  NUCLEZYME-FORTE supplement   OVER THE COUNTER MEDICATION Take 1 capsule by mouth 2 (two) times daily. COCOAVIA supplement   pioglitazone  45 MG tablet Commonly known as: ACTOS  Take 45 mg by mouth daily before supper.   pregabalin  200 MG capsule Commonly known as: LYRICA  Take 200 mg by mouth 3 (three) times daily.   PROBIOTIC PO Take 1 capsule by mouth daily.   Quercetin 500 MG Caps Take 500 mg by mouth daily.   ranolazine  1000 MG SR tablet Commonly known as: RANEXA  Take 1 tablet (1,000 mg total) by mouth 2 (two) times daily.   rosuvastatin  40 MG tablet Commonly known as: CRESTOR  Take 1 tablet (40 mg total) by mouth daily. What changed: when to take this   spironolactone  25 MG tablet Commonly known as: ALDACTONE  TAKE 1/2 TABLET(12.5 MG) BY MOUTH DAILY   TURMERIC PO Take 5 mLs by mouth daily. Liquid supplement   Vitamin D3 50 MCG (2000 UT) capsule Take 2,000 Units by mouth daily.   Vitamin E 268 MG (400 UNIT) Caps Take 268 mg by mouth daily.        Discharge Exam: Filed Weights   05/19/24 1833  Weight: 95 kg      05/20/2024   11:56 AM 05/20/2024   11:29 AM 05/20/2024   10:35 AM  Vitals with BMI  Systolic 116 116 875  Diastolic 67 67 63  Pulse  55 56   General - Elderly Caucasian male, no apparent distress HEENT - PERRLA, EOMI, atraumatic head, non tender sinuses. Lung - Clear, no rales, rhonchi, wheezes. Heart - S1, S2 heard, no murmurs, rubs, no pedal edema. Abdomen - Soft, non tender, bowel sounds good Neuro - Alert, awake and oriented x 3, non focal exam. Skin - Warm and dry.  Condition at discharge: stable  The results of significant diagnostics from this hospitalization (including imaging, microbiology, ancillary and laboratory) are listed below for reference.   Imaging Studies: EP STUDY Result Date: 05/20/2024 See surgical note for result.  DG Chest 2 View Result Date: 05/18/2024 CLINICAL DATA:  Weakness EXAM: CHEST - 2 VIEW COMPARISON:   01/27/2024 FINDINGS: Prior CABG. Cardiomegaly. No confluent opacities, effusions or edema. No acute bony abnormality. Spinal stimulator with in place, unchanged. IMPRESSION: No active cardiopulmonary disease. Electronically Signed   By: Franky Crease M.D.   On: 05/18/2024 14:19   ECHOCARDIOGRAM COMPLETE Result Date: 05/07/2024    ECHOCARDIOGRAM REPORT   Patient Name:   FITZROY MIKAMI Date of Exam: 05/07/2024 Medical Rec #:  [id number]       Height:       69.0 in Accession #:    [id number]      Weight:       200.0 lb Date of Birth:  1946-09-13       BSA:  2.066 m Patient Age:    77 years        BP:           116/71 mmHg Patient Gender: M               HR:           62 bpm. Exam Location:  [address] Procedure: 2D Echo, 3D Echo, Cardiac Doppler and Color Doppler (Both Spectral            and Color Flow Doppler were utilized during procedure). Indications:    I35.9 Aortic Valve Disorder  History:        Patient has prior history of Echocardiogram examinations, most                 recent 05/23/2023. CAD, Prior CABG, Aortic Valve Disease,                 Arrythmias:Atrial Flutter and Atrial Fibrillation; Risk                 Factors:Hypertension, Dyslipidemia, Diabetes and Family History                 of Coronary Artery Disease.  Sonographer:    Heather Hawks RDCS Referring Phys: ELFRIEDA HAGGARD IMPRESSIONS  1. Left ventricular ejection fraction, by estimation, is 60 to 65%. Left ventricular ejection fraction by 3D volume is 67 %. The left ventricle has normal function. The left ventricle has no regional wall motion abnormalities. There is mild concentric left ventricular hypertrophy. Left ventricular diastolic parameters were normal.  2. Right ventricular systolic function is normal. The right ventricular size is normal.  3. Left atrial size was mildly dilated.  4. The mitral valve is normal in structure. Trivial mitral valve regurgitation. No evidence of mitral stenosis.  5. The aortic valve is  tricuspid. There is mild calcification of the aortic valve. Aortic valve regurgitation is mild. Mild to moderate aortic valve stenosis. Aortic regurgitation PHT measures 407 msec. Aortic valve area, by VTI measures 1.88 cm. Aortic valve mean gradient measures 9.6 mmHg. Aortic valve Vmax measures 2.38 m/s.  6. Aortic dilatation noted. There is borderline dilatation of the ascending aorta, measuring 40 mm.  7. The inferior vena cava is normal in size with greater than 50% respiratory variability, suggesting right atrial pressure of 3 mmHg. FINDINGS  Left Ventricle: Left ventricular ejection fraction, by estimation, is 60 to 65%. Left ventricular ejection fraction by 3D volume is 67 %. The left ventricle has normal function. The left ventricle has no regional wall motion abnormalities. The left ventricular internal cavity size was normal in size. There is mild concentric left ventricular hypertrophy. Left ventricular diastolic parameters were normal. Right Ventricle: The right ventricular size is normal. No increase in right ventricular wall thickness. Right ventricular systolic function is normal. Left Atrium: Left atrial size was mildly dilated. Right Atrium: Right atrial size was normal in size. Pericardium: There is no evidence of pericardial effusion. Mitral Valve: The mitral valve is normal in structure. Trivial mitral valve regurgitation. No evidence of mitral valve stenosis. Tricuspid Valve: The tricuspid valve is normal in structure. Tricuspid valve regurgitation is trivial. No evidence of tricuspid stenosis. Aortic Valve: The aortic valve is tricuspid. There is mild calcification of the aortic valve. Aortic valve regurgitation is mild. Aortic regurgitation PHT measures 407 msec. Mild to moderate aortic stenosis is present. Aortic valve mean gradient measures  9.6 mmHg. Aortic valve peak gradient measures 22.6 mmHg. Aortic valve  area, by VTI measures 1.88 cm. Pulmonic Valve: The pulmonic valve was normal in  structure. Pulmonic valve regurgitation is mild. No evidence of pulmonic stenosis. Aorta: Aortic dilatation noted. There is borderline dilatation of the ascending aorta, measuring 40 mm. Venous: The inferior vena cava is normal in size with greater than 50% respiratory variability, suggesting right atrial pressure of 3 mmHg. IAS/Shunts: No atrial level shunt detected by color flow Doppler. Additional Comments: 3D was performed not requiring image post processing on an independent workstation and was normal.  LEFT VENTRICLE PLAX 2D LVIDd:         5.00 cm         Diastology LVIDs:         2.90 cm         LV e' medial:    9.36 cm/s LV PW:         0.90 cm         LV E/e' medial:  8.0 LV IVS:        1.10 cm         LV e' lateral:   15.90 cm/s LVOT diam:     2.70 cm         LV E/e' lateral: 4.7 LV SV:         105 LV SV Index:   51 LVOT Area:     5.73 cm        3D Volume EF                                LV 3D EF:    Left                                             ventricul                                             ar                                             ejection                                             fraction                                             by 3D                                             volume is  67 %.                                 3D Volume EF:                                3D EF:        67 %                                LV EDV:       172 ml                                LV ESV:       57 ml                                LV SV:        115 ml RIGHT VENTRICLE RV Basal diam:  4.00 cm RV S prime:     13.90 cm/s TAPSE (M-mode): 1.9 cm LEFT ATRIUM              Index        RIGHT ATRIUM           Index LA diam:        5.20 cm  2.52 cm/m   RA Pressure: 3.00 mmHg LA Vol (A2C):   121.0 ml 58.57 ml/m  RA Area:     17.00 cm LA Vol (A4C):   103.0 ml 49.85 ml/m  RA Volume:   46.90 ml  22.70 ml/m LA Biplane Vol: 119.0 ml 57.60 ml/m  AORTIC VALVE AV  Area (Vmax):    2.03 cm AV Area (Vmean):   2.32 cm AV Area (VTI):     1.88 cm AV Vmax:           237.60 cm/s AV Vmean:          136.800 cm/s AV VTI:            0.559 m AV Peak Grad:      22.6 mmHg AV Mean Grad:      9.6 mmHg LVOT Vmax:         84.40 cm/s LVOT Vmean:        55.550 cm/s LVOT VTI:          0.183 m LVOT/AV VTI ratio: 0.33 AI PHT:            407 msec  AORTA Ao Root diam: 3.70 cm Ao Asc diam:  3.95 cm MITRAL VALVE               TRICUSPID VALVE MV Area (PHT)  cm         Estimated RAP:  3.00 mmHg MV Decel Time: 194 msec MV E velocity: 74.70 cm/s  SHUNTS MV A velocity: 57.15 cm/s  Systemic VTI:  0.18 m MV E/A ratio:  1.31        Systemic Diam: 2.70 cm Toribio Fuel MD Electronically signed by Toribio Fuel MD Signature Date/Time: 05/07/2024/7:06:22 PM    Final     Microbiology: Results for orders placed or performed during the hospital encounter of 05/19/24  MRSA Next Gen by PCR, Nasal  Status: None   Collection Time: 05/19/24  6:39 PM   Specimen: Nasal Mucosa; Nasal Swab  Result Value Ref Range Status   MRSA by PCR Next Gen NOT DETECTED NOT DETECTED Final    Comment: (NOTE) The GeneXpert MRSA Assay (FDA approved for NASAL specimens only), is one component of a comprehensive MRSA colonization surveillance program. It is not intended to diagnose MRSA infection nor to guide or monitor treatment for MRSA infections. Test performance is not FDA approved in patients less than 35 years old. Performed at [facility], [address], [place], [place] [address]     Labs: CBC: Recent Labs  Lab 05/18/24 1304 05/19/24 0953 05/20/24 0752  WBC 5.7 6.8 5.7  NEUTROABS 3.8 5.2  --   HGB 14.5 14.8 14.8  HCT 43.9 44.7 43.4  MCV 88.7 90.1 87.7  PLT 238 212 210   Basic Metabolic Panel: Recent Labs  Lab 05/18/24 1304 05/19/24 0953 05/20/24 0752  NA 134* 134* 135  K 3.8 4.5 4.2  CL 102 102 104  CO2 24 22 23   GLUCOSE 126* 120* 121*  BUN 20 23 23   CREATININE 0.97 0.96  0.93  CALCIUM  9.2 8.9 8.9  MG  --  2.3  --    Liver Function Tests: No results for input(s): AST, ALT, ALKPHOS, BILITOT, PROT, ALBUMIN in the last 168 hours. CBG: No results for input(s): GLUCAP in the last 168 hours.  Discharge time spent: 35 minutes.  Signed: Concepcion Riser, MD [facility] 05/20/2024

## 2024-05-20 NOTE — Anesthesia Postprocedure Evaluation (Signed)
 Anesthesia Post Note  Patient: Ruben [name]  Procedure(s) Performed: CARDIOVERSION     Patient location during evaluation: PACU Anesthesia Type: General Level of consciousness: awake and alert, oriented and patient cooperative Pain management: pain level controlled Vital Signs Assessment: post-procedure vital signs reviewed and stable Respiratory status: spontaneous breathing, nonlabored ventilation and respiratory function stable Cardiovascular status: blood pressure returned to baseline and stable Postop Assessment: no apparent nausea or vomiting Anesthetic complications: no   No notable events documented.  Last Vitals:  Vitals:   05/20/24 0958 05/20/24 1008  BP: 132/66 109/66  Pulse: (!) 59 (!) 54  Resp: 16 15  Temp: 36.6 C   SpO2: 99% 96%    Last Pain:  Vitals:   05/20/24 1008  TempSrc:   PainSc: 0-No pain                 Almarie CHRISTELLA Marchi

## 2024-05-20 NOTE — Transfer of Care (Signed)
 Immediate Anesthesia Transfer of Care Note  Patient: Ruben [name]  Procedure(s) Performed: CARDIOVERSION  Patient Location: PACU  Anesthesia Type:General  Level of Consciousness: awake, alert , and oriented  Airway & Oxygen Therapy: Patient Spontanous Breathing and Patient connected to nasal cannula oxygen  Post-op Assessment: Report given to RN and Post -op Vital signs reviewed and stable  Post vital signs: Reviewed and stable  Last Vitals:  Vitals Value Taken Time  BP    Temp    Pulse    Resp    SpO2      Last Pain:  Vitals:   05/20/24 0817  TempSrc: Oral  PainSc:          Complications: No notable events documented.

## 2024-05-20 NOTE — Progress Notes (Signed)
 Progress Note  Patient Name: Ruben [name] Date of Encounter: 05/20/2024  Primary Cardiologist: None   Subjective   Plans for DCCV today - still feels fatigue  Inpatient Medications    Scheduled Meds:  apixaban   5 mg Oral BID   clopidogrel   75 mg Oral Daily   empagliflozin   10 mg Oral Daily   isosorbide  mononitrate  60 mg Oral Daily   losartan   25 mg Oral QPM   pioglitazone   45 mg Oral QAC supper   pregabalin   200 mg Oral TID   ranolazine   1,000 mg Oral BID   rosuvastatin   40 mg Oral QPM   sodium chloride  flush  3 mL Intravenous Q12H   spironolactone   12.5 mg Oral Daily   tapentadol   200 mg Oral BID   Continuous Infusions:  sodium chloride  20 mL/hr at 05/20/24 9373   diltiazem  (CARDIZEM ) infusion 12.5 mg/hr (05/19/24 2149)   PRN Meds: acetaminophen  **OR** acetaminophen , albuterol    Vital Signs    Vitals:   05/19/24 1833 05/19/24 1949 05/19/24 2309 05/20/24 0500  BP:  112/73 122/63 (!) 110/57  Pulse:  92 (!) 54 68  Resp:  20 18 17   Temp: 98 F (36.7 C) 98 F (36.7 C) 98.5 F (36.9 C) 97.7 F (36.5 C)  TempSrc: Oral Oral Oral Oral  SpO2:  95% 98% 97%  Weight: 95 kg     Height: 5' 9 (1.753 m)       Intake/Output Summary (Last 24 hours) at 05/20/2024 0745 Last data filed at 05/19/2024 2048 Gross per 24 hour  Intake 88.64 ml  Output --  Net 88.64 ml   Filed Weights   05/19/24 1833  Weight: 95 kg    Telemetry    Afib with controlled ventricular rate - Personally Reviewed  ECG     - Personally Reviewed  Physical Exam    General: Comfortable Head: Atraumatic, normal size  Eyes: PEERLA, EOMI  Neck: Supple, normal JVD Cardiac: Normal S1, S2; RRR; no murmurs, rubs, or gallops Lungs: Clear to auscultation bilaterally Abd: Soft, nontender, no hepatomegaly  Ext: warm, no edema Musculoskeletal: No deformities, BUE and BLE strength normal and equal Skin: Warm and dry, no rashes   Neuro: Alert and oriented to person, place, time, and situation,  CNII-XII grossly intact, no focal deficits  Psych: Normal mood and affect   Labs    Chemistry Recent Labs  Lab 05/18/24 1304 05/19/24 0953  NA 134* 134*  K 3.8 4.5  CL 102 102  CO2 24 22  GLUCOSE 126* 120*  BUN 20 23  CREATININE 0.97 0.96  CALCIUM  9.2 8.9  GFRNONAA >60 >60  ANIONGAP 8 10     Hematology Recent Labs  Lab 05/18/24 1304 05/19/24 0953  WBC 5.7 6.8  RBC 4.95 4.96  HGB 14.5 14.8  HCT 43.9 44.7  MCV 88.7 90.1  MCH 29.3 29.8  MCHC 33.0 33.1  RDW 17.2* 17.3*  PLT 238 212    Cardiac EnzymesNo results for input(s): TROPONINI in the last 168 hours. No results for input(s): TROPIPOC in the last 168 hours.   BNP Recent Labs  Lab 05/19/24 1104  BNP 100.3*     DDimer No results for input(s): DDIMER in the last 168 hours.   Radiology    DG Chest 2 View Result Date: 05/18/2024 CLINICAL DATA:  Weakness EXAM: CHEST - 2 VIEW COMPARISON:  01/27/2024 FINDINGS: Prior CABG. Cardiomegaly. No confluent opacities, effusions or edema. No acute bony abnormality. Spinal  stimulator with in place, unchanged. IMPRESSION: No active cardiopulmonary disease. Electronically Signed   By: Franky Crease M.D.   On: 05/18/2024 14:19    Cardiac Studies   echo  Patient Profile     78 y.o. male with a hx of atrial flutter status post ablation in 2018 and most recent ablation in July 2025, coronary artery disease status post CABG and PCI, diabetes mellitus, dyslipidemia and hypertension   Assessment & Plan    Paroxysmal Atrial flutter Type 2 Diabetes Mellitus  Hyperlipidemia   Symptomatic in afib - plans for DCCV today.  Continue the Cardizem  gtt, coreg  3.125 mg BID and Eliquis  5 mg BID. Post cardioversion can convert to Cardizem  120 mg daily for discharge.   Informed Consent   Shared Decision Making/Informed Consent The risks (stroke, cardiac arrhythmias rarely resulting in the need for a temporary or permanent pacemaker, skin irritation or burns and complications  associated with conscious sedation including aspiration, arrhythmia, respiratory failure and death), benefits (restoration of normal sinus rhythm) and alternatives of a direct current cardioversion were explained in detail to Mr. Beamer and he agrees to proceed.   Type 2 Diabetes Mellitus - per primary team    Hyperlipidemia - continue crestor      For questions or updates, please contact [facility] Please consult [email or web address] for contact info under Cardiology/STEMI.      Signed, Orly Quimby, DO  05/20/2024, 7:45 AM

## 2024-05-20 NOTE — Progress Notes (Signed)
 Patient left room for cardioversion. Patient has 1 wallet with credit cards and undisclosed amount of money and 1 silver ring on beside table. Offered to send wallet to security, patient declined. Was told by IR RN that patients ring was left on bedside table. This RN placed 1 silver ring and 1 wallet with credit cards and undisclosed amount of money in a sealed plastic bag with patients label on outside of bag. Patients wife arrived to the room after patient departed for cardioversion, notified wife where belongings were located.

## 2024-05-21 DIAGNOSIS — G629 Polyneuropathy, unspecified: Secondary | ICD-10-CM | POA: Diagnosis not present

## 2024-05-21 DIAGNOSIS — E7800 Pure hypercholesterolemia, unspecified: Secondary | ICD-10-CM | POA: Diagnosis not present

## 2024-05-21 DIAGNOSIS — Z1331 Encounter for screening for depression: Secondary | ICD-10-CM | POA: Diagnosis not present

## 2024-05-21 DIAGNOSIS — I2510 Atherosclerotic heart disease of native coronary artery without angina pectoris: Secondary | ICD-10-CM | POA: Diagnosis not present

## 2024-05-21 DIAGNOSIS — E1142 Type 2 diabetes mellitus with diabetic polyneuropathy: Secondary | ICD-10-CM | POA: Diagnosis not present

## 2024-05-21 DIAGNOSIS — Z0000 Encounter for general adult medical examination without abnormal findings: Secondary | ICD-10-CM | POA: Diagnosis not present

## 2024-05-21 DIAGNOSIS — I480 Paroxysmal atrial fibrillation: Secondary | ICD-10-CM | POA: Diagnosis not present

## 2024-05-21 DIAGNOSIS — Z79899 Other long term (current) drug therapy: Secondary | ICD-10-CM | POA: Diagnosis not present

## 2024-05-21 DIAGNOSIS — D649 Anemia, unspecified: Secondary | ICD-10-CM | POA: Diagnosis not present

## 2024-05-21 DIAGNOSIS — I10 Essential (primary) hypertension: Secondary | ICD-10-CM | POA: Diagnosis not present

## 2024-05-24 DIAGNOSIS — E7800 Pure hypercholesterolemia, unspecified: Secondary | ICD-10-CM | POA: Diagnosis not present

## 2024-05-24 DIAGNOSIS — E1142 Type 2 diabetes mellitus with diabetic polyneuropathy: Secondary | ICD-10-CM | POA: Diagnosis not present

## 2024-05-24 DIAGNOSIS — I2510 Atherosclerotic heart disease of native coronary artery without angina pectoris: Secondary | ICD-10-CM | POA: Diagnosis not present

## 2024-05-24 DIAGNOSIS — I10 Essential (primary) hypertension: Secondary | ICD-10-CM | POA: Diagnosis not present

## 2024-05-28 DIAGNOSIS — Z5181 Encounter for therapeutic drug level monitoring: Secondary | ICD-10-CM | POA: Diagnosis not present

## 2024-05-28 DIAGNOSIS — G609 Hereditary and idiopathic neuropathy, unspecified: Secondary | ICD-10-CM | POA: Diagnosis not present

## 2024-05-28 DIAGNOSIS — Z79899 Other long term (current) drug therapy: Secondary | ICD-10-CM | POA: Diagnosis not present

## 2024-05-28 DIAGNOSIS — G894 Chronic pain syndrome: Secondary | ICD-10-CM | POA: Diagnosis not present

## 2024-05-28 NOTE — Progress Notes (Signed)
 Return Visit Note  PATIENT IDENTIFIER: Ruben [name] is a 78 y.o.  (Jul 12, 1946) male PATIENT MRN: [id number] DATE OF SERVICE: 05/28/2024   Chief Complaint  Patient presents with  . Hand Pain    Left  . Foot Pain    B\I    HPI This is a return visit for Ruben [name], a pleasant 78 y.o. male with a history of chronic pain syndrome with peripheral neuropathy in the right hand and both feet, as well as, long-term use of opiate analgesics.  SUBJECTIVE Ruben [name] is a 78 y.o.  male who was last seen on7/1/25 where he is here today for a follow up in office.  Currently rates his pain today as a 9 out of 10 on the NRS without medications and a 1 out of 10 on the NRS with medications.  He describes pain as electric, numbing, dull, sharp and stinging.  He does list his pain is constant in nature.  His pain is located bilateral hands and bilateral feet.  He does feel his pain is made better with medications and worse with climbing a ladder.  He denies any changes in his pain or sleep since his last visit.  Medications and procedures have allowed him to be more active and functional he is able to live almost a normal life.  He does state he had a cardiac ablation since his last office visit.  He does continue to try to walk daily.  He has been continued on Nucynta  and pregabalin  and currently denies any side effects in these medications.  No orders of the defined types were placed in this encounter.  Past Medical History:  Diagnosis Date  . Coronary artery disease   . Hypercholesteremia   . Hypertension   . Spondylolysis    Past Surgical History:  Procedure Laterality Date  . Cervical discectomy    . Coronary artery bypass graft    . Coronary stent insertion    . Spinal cord stimulator implant     Allergies  Allergen Reactions  . Cymbalta [Duloxetine Hcl] Unknown, Diarrhea and Other  . Metformin Other    Exacerbated neuropathy symptoms   . Nortriptyline Other     Groggy at low dose  . Duloxetine Abdominal Pain   Current Outpatient Medications on File Prior to Visit  Medication Sig Dispense Refill  . carvedilol  (COREG ) 6.25 MG tablet     . Cholecalciferol (VITAMIN D3) 125 MCG/0.5ML LIQD Take 5,000 Units by mouth daily.    . clopidogrel  (PLAVIX ) 75 MG tablet     . CRESTOR  40 MG tablet     . empagliflozin  (JARDIANCE ) 10 mg TABS tablet Take one tablet (10 mg dose) by mouth daily.    . hydrocortisone 2.5 % cream SMARTSIG:Topical    . isosorbide  mononitrate (IMDUR ) 30 MG 24 hr tablet     . losartan  potassium (COZAAR ) 25 mg tablet Take one tablet (25 mg dose) by mouth daily.    . naloxone (NARCAN) nasal spray (TAKE HOME PACK) one spray by Intranasal route once as needed for Opioid Reversal for up to 1 dose. 2 each 0  . nitroGLYCERIN  (NITROLINGUAL ) 0.4 mg/spray spray SMARTSIG:1 Spray(s) Sublingual As Needed    . omega-3 acid ethyl esters (LOVAZA ) 1 g capsule TK 2 CS PO D  3  . pioglitazone  (ACTOS ) 45 MG tablet     . pregabalin  (LYRICA ) 200 MG capsule Take one capsule (200 mg dose) by mouth 3 (three) times a day. 270 capsule 0  .  RANEXA  1000 MG SR tablet     . spironolactone  (ALDACTONE ) 25 MG tablet Take 12.5 mg by mouth daily.     No current facility-administered medications on file prior to visit.   Social History   Socioeconomic History  . Marital status: Married  Tobacco Use  . Smoking status: Never  Substance and Sexual Activity  . Alcohol use: No  . Drug use: No   Outpatient Medications Prior to Visit  Medication Sig Dispense Refill  . carvedilol  (COREG ) 6.25 MG tablet     . Cholecalciferol (VITAMIN D3) 125 MCG/0.5ML LIQD Take 5,000 Units by mouth daily.    . clopidogrel  (PLAVIX ) 75 MG tablet     . CRESTOR  40 MG tablet     . empagliflozin  (JARDIANCE ) 10 mg TABS tablet Take one tablet (10 mg dose) by mouth daily.    . hydrocortisone 2.5 % cream SMARTSIG:Topical    . isosorbide  mononitrate (IMDUR ) 30 MG 24 hr tablet     . losartan   potassium (COZAAR ) 25 mg tablet Take one tablet (25 mg dose) by mouth daily.    . naloxone (NARCAN) nasal spray (TAKE HOME PACK) one spray by Intranasal route once as needed for Opioid Reversal for up to 1 dose. 2 each 0  . nitroGLYCERIN  (NITROLINGUAL ) 0.4 mg/spray spray SMARTSIG:1 Spray(s) Sublingual As Needed    . omega-3 acid ethyl esters (LOVAZA ) 1 g capsule TK 2 CS PO D  3  . pioglitazone  (ACTOS ) 45 MG tablet     . pregabalin  (LYRICA ) 200 MG capsule Take one capsule (200 mg dose) by mouth 3 (three) times a day. 270 capsule 0  . pregabalin  (LYRICA ) 200 MG capsule Take one capsule (200 mg dose) by mouth 3 (three) times a day. 270 capsule 0  . RANEXA  1000 MG SR tablet     . spironolactone  (ALDACTONE ) 25 MG tablet Take 12.5 mg by mouth daily.    . tapentadol  (NUCYNTA ) 200 MG TB12 Take one tablet (200 mg dose) by mouth every 12 (twelve) hours for 30 days. 60 tablet 0   No facility-administered medications prior to visit.   Review of Systems: Psychiatric: (+) No abnormal findings, Neurological: (+) No abnormal findings, Musculoskeletal: (+) per HPI Integumentary: (+) No abnormal findings, Hematologic / Lymphatic: (+) No abnormal findings, Genitourinary: (+) No abnormal findings, Gastrointestinal: (+) No abnormal findings, Endocrine: (+) No abnormal findings, Constitutional Symptoms: (+) No abnormal findings, Cardiovascular: (+) No abnormal findings.  Please see scanned intake sheet for details of ROS.    Physical Exam  Vitals:   05/28/24 1048  BP: 129/63  Pulse: 62  Resp: 16  SpO2: 93%  Weight: 203 lb (92.1 kg)  Height: 5' 11 (1.803 m)   GENERAL: well developed, well nourished male patient awake, alert, and oriented x3, in no acute distress. DERMATOLOGIC: intact, no rashes present, normal turgor HEENT: normocephalic, slerae anicteric, mucous membranes moist. CERVICAL SPINE: supple, full range of motion,  no cervical Paraspinous tenderness.  PULMONARY: unlabored respiration, symmetric  expansion EXTREMITIES: no cyanosis, clubbing, or edema; peripheral pulses intact with brisk capillary refill.  No TTP in bilateral hands, no discoloration MSK: normal tone and posture, full range of motion in all extremities, 5/5 motor strength globally LUMBAR SPINE: no Paraspinous tenderness, negative facet loading NEUROLOGIC: CN II-XII grossly intact, sensation to light touch and pinprick intact globally, normal gait and station PSYCHIATRIC: Demonstrates no pressured speech. Normal affect. Normal cognition.   Assessment   ICD-10-CM   1. Peripheral neuropathy, idiopathic  G60.9 tapentadol  (  NUCYNTA ) 200 MG TB12    tapentadol  (NUCYNTA ) 200 MG TB12    pregabalin  (LYRICA ) 200 MG capsule    2. Pain syndrome, chronic  G89.4 tapentadol  (NUCYNTA ) 200 MG TB12    tapentadol  (NUCYNTA ) 200 MG TB12    pregabalin  (LYRICA ) 200 MG capsule      Last UDS completed on 11/29/2023 - consistent  PLAN  Continue the Nucynta  ER 200 mg one PO BID DNF: 9/6, 10/6 Continue Lyrica  200 mg one PO TID, 90 day supply #270 refill for 9/20 Discontinue oxycodone   UDS completed today Narcan spray has been renewed today.  All questions and concerns addressed prior to discharge. Follow up in 8 weeks.  Check POC UDS today. Pending confirmation. Pt is low risk.   Urine Drug Screening is a widely available and familiar form of testing used in order to make informed choices with regard to clinical decision making in chronic pain management patients.  Opioid analgesics must be prescribed with discernment and their appropriate use must be assessed periodically.  Urine Drug Screening can help track patient compliance, identify substance abuse and misuse, and prevent potentially dangerous drug-drug interactions.  Today, an 8-panel point of care immunoassay and alcohol screen was performed in order to accomplish this and to help determine the best course of action for today's visit.  Immunoassay urine drug screening, like all tests,  is not without its limitations with regard to specificity and sensitivity.  For example, 1) We are unable to detect fentanyl  with our currently available immunoassay technology.  2) We are unable to determine the specific medications present and their metabolites within the drug classes.  3) The Opiate class represents morphine, hydrocodone , and hydromorphone.  Differentiation between these cannot be determined.  4) Likewise, the Methadone  class represents methadone  and tapentadol .   5) The Oxycodone  class represents oxycodone  and oxymorphone.  The Benzodiazepine class contains all commonly prescribed in that category.  Due to these and other limitations encountered with this form of screening, confirmatory testing may also be deemed to be medically necessary and ordered to assist in making decisions regarding this patient's long-term care.   The PMP aware online was queried to confirm compliance regarding the patient's narcotic pain medications.  My review reveals appropriate prescription fills and that [facility] is the sole provider of these medications.  Rechecks will occur regularly and the patient is aware of our use of the system.  Plan of care was formulated with my supervising physician.  Curtiss Patee, FNP-C  This note was dictated using Conservation officer, historic buildings. Similar sounding words can inadvertently be transcribed and not corrected upon review. Please disregard any errors in transcription. Such creation errors do not reflect on the standard of medical care.

## 2024-06-03 NOTE — Progress Notes (Unsigned)
 Electrophysiology Office Note:   Date:  06/04/2024  ID:  Ruben [name], DOB 12/07/1945, MRN [id number]  Primary Cardiologist: None Primary Heart Failure: None Electrophysiologist: Eulas FORBES Furbish, MD      History of Present Illness:   Ruben [name] is a 78 y.o. 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    Admitted 8/26-8/27/25 for symptomatic paroxysmal atrial flutter with RVR.  He was started on a cardizem  gtt and underwent DCCV (one shock, NSR).  He was recommended for cardizem  120 mg daily and to continue Eliquis .   Since discharge from hospital the patient reports he has not had any further episodes after the above admission. He can not identify any particular trigger - he does not drink, watches his diet.  He did note that his AF symptoms occurred early am and his wife is concerned he has sleep apnea. He is pending a sleepy study through his PCP. He asks about treatment options for rhythm. He also asks about the Watchman procedure.  He reports he would like to not have to remain on Eliquis  if possible due to bruising issues. No hx of open wounds, no hx of significant bleeding events, no hx of falls.    He denies chest pain, palpitations, dyspnea, PND, orthopnea, nausea, vomiting, dizziness, syncope, edema, weight gain, or early satiety.   Review of systems complete and found to be negative unless listed in HPI.   EP Information / Studies Reviewed:    EKG is ordered today. Personal review as below.  EKG Interpretation Date/Time:  Thursday June 04 2024 14:43:42 EDT Ventricular Rate:  62 PR Interval:  264 QRS Duration:  106 QT Interval:  428 QTC Calculation: 434 R Axis:   -37  Text Interpretation: Sinus rhythm with 1st degree A-V block Left axis deviation Confirmed by Aniceto Jarvis ([id number]) on 06/04/2024 3:09:19 PM   Arrhythmia / AAD / Pertinent EP Studies Typical Atrial Flutter  EPS 04/2017 > RF ablation of  CTI  Atrial Fibrillation  EPS 03/26/24 > PF ablation of PVI, PW. CTI blocked from prior ablation.  DCCV 05/20/24 > 200j converted to SB   Risk Assessment/Calculations:    CHA2DS2-VASc Score = 5   This indicates a 7.2% annual risk of stroke. The patient's score is based upon: CHF History: 0 HTN History: 1 Diabetes History: 1 Stroke History: 0 Vascular Disease History: 1 Age Score: 2 Gender Score: 0         Physical Exam:   VS:  BP (!) 163/75   Pulse 62   Ht 5' 9 (1.753 m)   Wt 200 lb (90.7 kg)   SpO2 96%   BMI 29.53 kg/m    Wt Readings from Last 3 Encounters:  06/04/24 200 lb (90.7 kg)  05/19/24 209 lb 8 oz (95 kg)  05/12/24 205 lb 14.4 oz (93.4 kg)     GEN: Well nourished, well developed in no acute distress NECK: No JVD; No carotid bruits CARDIAC: Regular rate and rhythm, no murmurs, rubs, gallops RESPIRATORY:  Clear to auscultation without rales, wheezing or rhonchi  ABDOMEN: Soft, non-tender, non-distended EXTREMITIES:  No edema; No deformity   ASSESSMENT AND PLAN:    Paroxysmal Atrial Fibrillation  Typical Atrial Flutter  CHA2DS2-VASc 5, s/p PVI+PW and CTI ablation -OAC for stroke prophylaxis  -continue cardizem  120 mg  -reviewed blinding period with patient and that given he has not had any further AF, we  can take a watchful waiting approach for now with close follow up  -if he has recurrent AF, we discussed AAD options > not a candidate for flecainide with CAD.  Tikosyn admission may be his best option > QTc acceptable when in NSR (~46ms), Cr Cl 91 mL/min based on most recent labs, would need medication review through pharmacy, appt with AF Clinic for admission.   -discussed the particulars of Tikosyn with patient  -he requests referral to MD that can perform Watchman as he would like to come off Eliquis  if possible > will refer to Dr. Kennyth for evaluation   Secondary Hypercoagulable State  -continue Eliquis  5mg  BID, dose reviewed and appropriate by age /  wt    1AVB Bradycardia  -monitor on EKG   CAD s/p CABG / PCI  -no anginal symptoms     OSA Symptoms  -pending PSG with PCP   Follow up with EP APP in 4 weeks to review for further AF burden.   Signed, Daphne Barrack, NP-C, AGACNP-BC [facility] - Electrophysiology  06/04/2024, 3:42 PM

## 2024-06-04 ENCOUNTER — Encounter: Payer: Self-pay | Admitting: Pulmonary Disease

## 2024-06-04 ENCOUNTER — Ambulatory Visit: Attending: Pulmonary Disease | Admitting: Pulmonary Disease

## 2024-06-04 VITALS — BP 163/75 | HR 62 | Ht 69.0 in | Wt 200.0 lb

## 2024-06-04 DIAGNOSIS — D6869 Other thrombophilia: Secondary | ICD-10-CM | POA: Insufficient documentation

## 2024-06-04 DIAGNOSIS — I4892 Unspecified atrial flutter: Secondary | ICD-10-CM | POA: Insufficient documentation

## 2024-06-04 DIAGNOSIS — Z951 Presence of aortocoronary bypass graft: Secondary | ICD-10-CM | POA: Diagnosis present

## 2024-06-04 DIAGNOSIS — I480 Paroxysmal atrial fibrillation: Secondary | ICD-10-CM | POA: Diagnosis not present

## 2024-06-04 DIAGNOSIS — I2510 Atherosclerotic heart disease of native coronary artery without angina pectoris: Secondary | ICD-10-CM | POA: Diagnosis present

## 2024-06-04 NOTE — Patient Instructions (Signed)
 Medication Instructions:  Your physician recommends that you continue on your current medications as directed. Please refer to the Current Medication list given to you today.  *If you need a refill on your cardiac medications before your next appointment, please call your pharmacy*  Lab Work: None ordered If you have labs (blood work) drawn today and your tests are completely normal, you will receive your results only by: MyChart Message (if you have MyChart) OR A paper copy in the mail If you have any lab test that is abnormal or we need to change your treatment, we will call you to review the results.  Follow-Up: At [facility], you and your health needs are our priority.  As part of our continuing mission to provide you with exceptional heart care, our providers are all part of one team.  This team includes your primary Cardiologist (physician) and Advanced Practice Providers or APPs (Physician Assistants and Nurse Practitioners) who all work together to provide you with the care you need, when you need it.  Your next appointment:   07/02/24 at 3:10 PM  Provider:   Daphne Barrack, NP   You have been referred to the EP (electrophysiology) department here in our office to see Dr Kennyth for consideration of Watchman.

## 2024-06-11 DIAGNOSIS — R0683 Snoring: Secondary | ICD-10-CM | POA: Diagnosis not present

## 2024-06-11 DIAGNOSIS — R0689 Other abnormalities of breathing: Secondary | ICD-10-CM | POA: Diagnosis not present

## 2024-06-11 DIAGNOSIS — I10 Essential (primary) hypertension: Secondary | ICD-10-CM | POA: Diagnosis not present

## 2024-06-12 DIAGNOSIS — I10 Essential (primary) hypertension: Secondary | ICD-10-CM | POA: Diagnosis not present

## 2024-06-12 DIAGNOSIS — I2510 Atherosclerotic heart disease of native coronary artery without angina pectoris: Secondary | ICD-10-CM | POA: Diagnosis not present

## 2024-06-12 DIAGNOSIS — E1142 Type 2 diabetes mellitus with diabetic polyneuropathy: Secondary | ICD-10-CM | POA: Diagnosis not present

## 2024-06-14 NOTE — Progress Notes (Signed)
 BNP ordered to check for CHF

## 2024-06-15 NOTE — Progress Notes (Addendum)
 " Electrophysiology Office Note:   Date:  06/16/2024  ID:  Ruben [name], DOB 1946/02/17, MRN [id number]  Primary Cardiologist: None Electrophysiologist: Eulas FORBES Furbish, MD      History of Present Illness:   Ruben [name] is a 78 y.o. male with h/o typical AF / AFL s/p ablation, CAD s/p CABG & PCI who is being seen today for evaluation for Watchman device implant at the request of Daphne Barrack, NP.   Discussed the use of AI scribe software for clinical note transcription with the patient, who gave verbal consent to proceed.  History of Present Illness Ruben [name] is a 78 year old male who presents for follow-up after an atrial fibrillation ablation.  He had CTI ablation by Dr. Kelsie in 2018. PVI and posterior wall ablation on 03/26/2024 with Dr. Furbish.   Following the most recent ablation, he experienced episodes of atrial fibrillation and atypical atrial flutter, with the most recent episode occurring in late August 2025. He reports that clinicians described something as 'P waves' during these episodes.  He has a history of multiple falls, one of which resulted in a major bleeding event. He also mentions several other falls.  He has bad peripheral neuropathy which he has attributed to his instability and falls.  Since the ablation procedure, he has not experienced any major bleeds.  He is currently on a blood thinner and experiences 'a lot of little cuts and bruises' but no major bleeding events of late.  No new or acute complaints otherwise.   Review of systems complete and found to be negative unless listed in HPI.   EP Information / Studies Reviewed:    EKG is not ordered today. EKG from 06/04/24 reviewed which showed sinus rhythm with first-degree AV delay, PR 264 ms, and PVC.     ECG 05/19/24: AFL   Echo 05/23/23:   1. Left ventricular ejection fraction, by estimation, is 60 to 65%. The  left ventricle has normal function. The left ventricle has no regional   wall motion abnormalities. Left ventricular diastolic parameters were  normal. The average left ventricular  global longitudinal strain is -27.1 %. The global longitudinal strain is  normal.   2. Right ventricular systolic function is normal. The right ventricular  size is normal.   3. Left atrial size was mildly dilated.   4. The mitral valve is normal in structure. Trivial mitral valve  regurgitation. No evidence of mitral stenosis.   5. The aortic valve is tricuspid. Aortic valve regurgitation is mild.  Aortic valve sclerosis is present, with no evidence of aortic valve  stenosis.   6. Aortic dilatation noted. There is mild dilatation of the ascending  aorta, measuring 39 mm.   7. The inferior vena cava is normal in size with greater than 50%  respiratory variability, suggesting right atrial pressure of 3 mmHg.   Risk Assessment/Calculations:    CHA2DS2-VASc Score = 5   This indicates a 7.2% annual risk of stroke. The patient's score is based upon: CHF History: 0 HTN History: 1 Diabetes History: 1 Stroke History: 0 Vascular Disease History: 1 Age Score: 2 Gender Score: 0          Physical Exam:   VS:  BP (!) 107/55 (BP Location: Left Arm, Patient Position: Sitting, Cuff Size: Normal)   Pulse 70   Ht 5' 9 (1.753 m)   Wt 204 lb 4.8 oz (92.7 kg)   SpO2 95%   BMI 30.17 kg/m  Wt Readings from Last 3 Encounters:  06/16/24 204 lb 4.8 oz (92.7 kg)  06/04/24 200 lb (90.7 kg)  05/19/24 209 lb 8 oz (95 kg)     GEN: Well nourished, well developed in no acute distress NECK: No JVD CARDIAC: Normal rate, regular rhythm.  3 out of 6 systolic ejection murmur. RESPIRATORY:  Clear to auscultation without rales, wheezing or rhonchi  ABDOMEN: Soft, non-distended EXTREMITIES:  No edema; No deformity   ASSESSMENT AND PLAN:   I have seen Ruben [name] in the office today who is being considered for a Watchman left atrial appendage closure device. I believe they will benefit  from this procedure given their history of atrial fibrillation, CHA2DS2-VASc score of 5 and unadjusted ischemic stroke rate of 7.2% per year. Unfortunately, the patient is not felt to be a long term anticoagulation candidate secondary to recurrent falls (at least 5) and bruising.  He has bad peripheral neuropathy which he has attributed to his instability and falls.  The patient's chart has been reviewed and I feel that they would be a candidate for short term oral anticoagulation after Watchman implant.   It is my belief that after undergoing a LAA closure procedure, Ruben [name] will not need long term anticoagulation which eliminates anticoagulation side effects and major bleeding risk.   Procedural risks for the Watchman implant have been reviewed with the patient including a 0.5% risk of stroke, <1% risk of perforation and <1% risk of device embolization. Other risks include bleeding, vascular damage, tamponade, worsening renal function, and death. The patient understands these risk and wishes to proceed.     The published clinical data on the safety and effectiveness of WATCHMAN include but are not limited to the following: - Holmes DR, Jess BEARD, Sick P et al. for the PROTECT AF Investigators. Percutaneous closure of the left atrial appendage versus warfarin therapy for prevention of stroke in patients with atrial fibrillation: a randomised non-inferiority trial. Lancet 2009; 374: 534-42. GLENWOOD Jess BEARD, Doshi SK, Jonita VEAR Satchel D et al. on behalf of the PROTECT AF Investigators. Percutaneous Left Atrial Appendage Closure for Stroke Prophylaxis in Patients With Atrial Fibrillation 2.3-Year Follow-up of the PROTECT AF (Watchman Left Atrial Appendage System for Embolic Protection in Patients With Atrial Fibrillation) Trial. Circulation 2013; 127:720-729. - Alli O, Doshi S,  Kar S, Reddy VY, Sievert H et al. Quality of Life Assessment in the Randomized PROTECT AF (Percutaneous Closure of the Left  Atrial Appendage Versus Warfarin Therapy for Prevention of Stroke in Patients With Atrial Fibrillation) Trial of Patients at Risk for Stroke With Nonvalvular Atrial Fibrillation. J Am Coll Cardiol 2013; 61:1790-8. GLENWOOD Satchel DR, Archer RAMAN, Price M, Whisenant B, Sievert H, Doshi S, Huber K, Reddy V. Prospective randomized evaluation of the Watchman left atrial appendage Device in patients with atrial fibrillation versus long-term warfarin therapy; the PREVAIL trial. Journal of the [employer] of Cardiology, Vol. 4, No. 1, 2014, 1-11. - Kar S, Doshi SK, Sadhu A, Horton R, Osorio J et al. Primary outcome evaluation of a next-generation left atrial appendage closure device: results from the PINNACLE FLX trial. Circulation 2021;143(18)1754-1762.    HAS-BLED score 3 Hypertension Yes  Abnormal renal and liver function (Dialysis, transplant, Cr >2.26 mg/dL /Cirrhosis or Bilirubin >2x Normal or AST/ALT/AP >3x Normal) No  Stroke No  Bleeding No  Labile INR (Unstable/high INR) No  Elderly (>65) Yes  Drugs or alcohol (>= 8 drinks/week, anti-plt or NSAID) Yes   CHA2DS2-VASc Score =  5  The patient's score is based upon: CHF History: 0 HTN History: 1 Diabetes History: 1 Stroke History: 0 Vascular Disease History: 1 Age Score: 2 Gender Score: 0       ASSESSMENT AND PLAN: After today's visit with the patient which was dedicated solely for shared decision making visit regarding LAA closure device, the patient decided to proceed with the LAA appendage closure procedure scheduled to be done in the near future at [facility]. Prior to the procedure, I would like to obtain a gated CT scan of the chest with contrast timed for PV/LA visualization.   Persistent Atrial Fibrillation, Typical AFL and Atypical AFL: S/p multiple catheter ablations. The patient's CHA2DS2-VASc score is 5, indicating a 7.2% annual risk of stroke.   Continue diltiazem  120 mg daily.  Secondary Hypercoagulable State (ICD10:   D68.69) The patient is at significant risk for stroke/thromboembolism based upon his CHA2DS2-VASc Score of 5.  Continue Apixaban  (Eliquis ).  He is also taking Plavix .  I see no recent PCI.  Unclear why he is on both at this time.  I will reach out to his new primary cardiologist, Dr. Wonda, and see if we can discontinue Plavix  at this time.  Follow up with Dr. Kennyth 3 months after [facility] implant.   Signed, Fonda Kennyth, MD  "

## 2024-06-16 ENCOUNTER — Encounter: Payer: Self-pay | Admitting: Cardiology

## 2024-06-16 ENCOUNTER — Ambulatory Visit: Attending: Cardiology | Admitting: Cardiology

## 2024-06-16 VITALS — BP 107/55 | HR 70 | Ht 69.0 in | Wt 204.3 lb

## 2024-06-16 DIAGNOSIS — D6869 Other thrombophilia: Secondary | ICD-10-CM | POA: Insufficient documentation

## 2024-06-16 DIAGNOSIS — I483 Typical atrial flutter: Secondary | ICD-10-CM | POA: Insufficient documentation

## 2024-06-16 DIAGNOSIS — R296 Repeated falls: Secondary | ICD-10-CM | POA: Insufficient documentation

## 2024-06-16 DIAGNOSIS — I484 Atypical atrial flutter: Secondary | ICD-10-CM | POA: Diagnosis not present

## 2024-06-16 DIAGNOSIS — I4819 Other persistent atrial fibrillation: Secondary | ICD-10-CM | POA: Diagnosis not present

## 2024-06-16 NOTE — Patient Instructions (Signed)
 Medication Instructions:  Your physician recommends that you continue on your current medications as directed. Please refer to the Current Medication list given to you today.  *If you need a refill on your cardiac medications before your next appointment, please call your pharmacy*   Testing/Procedures: Cardiac CT Your physician has requested that you have cardiac CT. Cardiac computed tomography (CT) is a painless test that uses an x-ray machine to take clear, detailed pictures of your heart. For further information please visit [email or web address] Please follow instruction sheet as given. You will be called to schedule this test.   Watchman  Your physician has requested that you have Left atrial appendage (LAA) closure device implantation is a procedure to put a small device in the LAA of the heart. The LAA is a small sac in the wall of the heart's left upper chamber. Blood clots can form in this area. The device, Watchman closes the LAA to help prevent a blood clot and stroke.  You will be contacted by Nurse Navigator, Rockie Redman to schedule your pre-procedure visit and procedure date. If you have any questions she can be reached at [phone].   Follow-Up: At [facility], you and your health needs are our priority.  As part of our continuing mission to provide you with exceptional heart care, our providers are all part of one team.  This team includes your primary Cardiologist (physician) and Advanced Practice Providers or APPs (Physician Assistants and Nurse Practitioners) who all work together to provide you with the care you need, when you need it.

## 2024-06-20 ENCOUNTER — Encounter (HOSPITAL_COMMUNITY): Payer: Self-pay | Admitting: *Deleted

## 2024-06-20 ENCOUNTER — Other Ambulatory Visit: Payer: Self-pay

## 2024-06-20 ENCOUNTER — Emergency Department (HOSPITAL_COMMUNITY): Admission: EM | Admit: 2024-06-20 | Discharge: 2024-06-20 | Disposition: A | Discharge status: Home / Self Care

## 2024-06-20 DIAGNOSIS — Z7902 Long term (current) use of antithrombotics/antiplatelets: Secondary | ICD-10-CM | POA: Diagnosis not present

## 2024-06-20 DIAGNOSIS — I4891 Unspecified atrial fibrillation: Secondary | ICD-10-CM | POA: Insufficient documentation

## 2024-06-20 DIAGNOSIS — Z7901 Long term (current) use of anticoagulants: Secondary | ICD-10-CM | POA: Insufficient documentation

## 2024-06-20 DIAGNOSIS — Z951 Presence of aortocoronary bypass graft: Secondary | ICD-10-CM | POA: Diagnosis not present

## 2024-06-20 DIAGNOSIS — I2510 Atherosclerotic heart disease of native coronary artery without angina pectoris: Secondary | ICD-10-CM | POA: Diagnosis not present

## 2024-06-20 DIAGNOSIS — R0602 Shortness of breath: Secondary | ICD-10-CM | POA: Diagnosis present

## 2024-06-20 LAB — CBC
HCT: 45.4 % (ref 39.0–52.0)
Hemoglobin: 15.1 g/dL (ref 13.0–17.0)
MCH: 29.9 pg (ref 26.0–34.0)
MCHC: 33.3 g/dL (ref 30.0–36.0)
MCV: 89.9 fL (ref 80.0–100.0)
Platelets: 242 K/uL (ref 150–400)
RBC: 5.05 MIL/uL (ref 4.22–5.81)
RDW: 17 % — ABNORMAL HIGH (ref 11.5–15.5)
WBC: 5.5 K/uL (ref 4.0–10.5)
nRBC: 0 % (ref 0.0–0.2)

## 2024-06-20 LAB — BASIC METABOLIC PANEL WITH GFR
Anion gap: 12 (ref 5–15)
BUN: 19 mg/dL (ref 8–23)
CO2: 23 mmol/L (ref 22–32)
Calcium: 9.2 mg/dL (ref 8.9–10.3)
Chloride: 103 mmol/L (ref 98–111)
Creatinine, Ser: 0.98 mg/dL (ref 0.61–1.24)
GFR, Estimated: >60 mL/min (ref >60)
Glucose, Bld: 143 mg/dL — ABNORMAL HIGH (ref 70–99)
Potassium: 4.1 mmol/L (ref 3.5–5.1)
Sodium: 138 mmol/L (ref 135–145)

## 2024-06-20 LAB — MAGNESIUM: Magnesium: 2.3 mg/dL (ref 1.7–2.4)

## 2024-06-20 LAB — TROPONIN I (HIGH SENSITIVITY): Troponin I (High Sensitivity): 9 ng/L (ref <18)

## 2024-06-20 MED ORDER — ETOMIDATE 2 MG/ML IV SOLN
10.0000 mg | Freq: Once | INTRAVENOUS | Status: DC
  Filled 2024-06-20: qty 10

## 2024-06-20 MED ORDER — SODIUM CHLORIDE 0.9 % IV BOLUS: 500.0000 mL | Freq: Once | INTRAVENOUS | Status: DC

## 2024-06-20 MED ORDER — LACTATED RINGERS IV BOLUS
1000.0000 mL | Freq: Once | INTRAVENOUS | Status: AC
  Administered 2024-06-20: 1000 mL via INTRAVENOUS

## 2024-06-20 MED ORDER — DILTIAZEM HCL ER COATED BEADS 180 MG PO CP24: 180.0000 mg | ORAL_CAPSULE | Freq: Every day | ORAL | 1 refills | Status: DC

## 2024-06-20 NOTE — ED Provider Notes (Signed)
 [place] EMERGENCY DEPARTMENT AT [facility] Provider Note   CSN: [id number] Arrival date & time: 06/20/24  1955     Patient presents with: Atrial Fibrillation   Ruben [name] is a 78 y.o. male.    Atrial Fibrillation  78 year old male PMH of paroxysmal atrial fibrillation on Eliquis , atrial flutter s/p ablation, CAD s/p PCI + CABG, and recent DCCV for symptomatic atrial flutter 08/27 presenting to the ED with chief complaint of ~18:15 being notified by his Apple Watch that he is not atrial fibrillation with elevated heart rate.  Patient reports that he takes diltiazem  120 daily, however took an additional 30 as instructed 1820 with shortness of breath and chills beginning 1900.  Patient denies any recent headaches, vision changes, chest pain, fevers, nausea, vomiting, abdominal pain, changes in urination, or changes in bowel movements.  No new lower extremity edema.   Of note, last ECHO 05/07/2024 with EF 60-65% and normal LV/RV function  Prior to Admission medications   Medication Sig Start Date End Date Taking? Authorizing Provider  apixaban  (ELIQUIS ) 5 MG TABS tablet Take 1 tablet (5 mg total) by mouth 2 (two) times daily. 11/22/23   Terra Fairy PARAS, PA-C  Cholecalciferol (VITAMIN D3) 50 MCG (2000 UT) capsule Take 2,000 Units by mouth daily.    [provider]  clopidogrel  (PLAVIX ) 75 MG tablet Take 1 tablet (75 mg total) by mouth daily. 07/18/23   Burnard Debby LABOR, MD  diltiazem  (CARDIZEM  CD) 180 MG 24 hr capsule Take 1 capsule (180 mg total) by mouth daily. 06/20/24   Daymen Hassebrock, Elsie, MD  Ferrous Sulfate (IRON PO) Take 1 capsule by mouth 3 (three) times a week.    [provider]  icosapent  Ethyl (VASCEPA ) 1 g capsule Take 2 capsules (2 g total) by mouth 2 (two) times daily. 07/22/23   Burnard Debby LABOR, MD  IRON-VITAMIN C PO Take 1 tablet by mouth every Monday, Wednesday, and Friday.    [provider]  isosorbide  mononitrate (IMDUR ) 60 MG 24 hr  tablet TAKE 1 TABLET(60 MG) BY MOUTH DAILY 11/19/23   Burnard Debby LABOR, MD  JARDIANCE  10 MG TABS tablet Take 10 mg by mouth daily.    [provider]  losartan  (COZAAR ) 25 MG tablet Take 1 tablet (25 mg total) by mouth daily. Patient taking differently: Take 25 mg by mouth every evening. 07/18/23   Burnard Debby LABOR, MD  MAGNESIUM PO Take 120 mg by mouth 2 (two) times daily.    [provider]  Multiple Vitamin (MULTIVITAMIN WITH MINERALS) TABS tablet Take 1 tablet by mouth daily.    [provider]  nitroGLYCERIN  (NITROLINGUAL ) 0.4 MG/SPRAY spray Place 1 spray under the tongue every 5 (five) minutes x 3 doses as needed for chest pain. Patient not taking: Reported on 06/16/2024 01/30/24   Burnard Debby LABOR, MD  NUCYNTA  ER 200 MG TB12 Take 200 mg by mouth 2 (two) times daily. 12/05/17   [provider]  OVER THE COUNTER MEDICATION Take 1 tablet by mouth daily. NUCLEZYME-FORTE supplement    [provider]  OVER THE COUNTER MEDICATION Take 1 capsule by mouth 2 (two) times daily. COCOAVIA supplement    [provider]  pioglitazone  (ACTOS ) 45 MG tablet Take 45 mg by mouth daily before supper.    [provider]  pregabalin  (LYRICA ) 200 MG capsule Take 200 mg by mouth 3 (three) times daily.     [provider]  Probiotic Product (PROBIOTIC PO) Take  1 capsule by mouth daily.    [provider]  Quercetin 500 MG CAPS Take 500 mg by mouth daily.    [provider]  ranolazine  (RANEXA ) 1000 MG SR tablet Take 1 tablet (1,000 mg total) by mouth 2 (two) times daily. 05/14/24   Wonda Sharper, MD  rosuvastatin  (CRESTOR ) 40 MG tablet Take 1 tablet (40 mg total) by mouth daily. Patient taking differently: Take 40 mg by mouth every evening. 07/18/23   Burnard Debby LABOR, MD  spironolactone  (ALDACTONE ) 25 MG tablet TAKE 1/2 TABLET(12.5 MG) BY MOUTH DAILY 07/18/23   Burnard Debby LABOR, MD  TURMERIC PO Take 5 mLs by mouth daily. Liquid  supplement    [provider]  Vitamin E 268 MG (400 UNIT) CAPS Take 268 mg by mouth daily.    [provider]  Vitamins-Lipotropics (LIPOFLAVONOID PO) Take 100 mg by mouth 3 (three) times daily.    [provider]    Allergies: Nortriptyline, Cymbalta [duloxetine hcl], and Niacin     Review of Systems  Updated Vital Signs BP 121/77   Pulse 78   Temp 98 F (36.7 C)   Resp 12   Ht 5' 9 (1.753 m)   Wt 92.7 kg   SpO2 99%   BMI 30.18 kg/m   Physical Exam Constitutional:      Appearance: Normal appearance.  HENT:     Head: Normocephalic and atraumatic.     Nose: Nose normal.     Mouth/Throat:     Mouth: Mucous membranes are moist.     Pharynx: Oropharynx is clear.  Eyes:     Extraocular Movements: Extraocular movements intact.     Pupils: Pupils are equal, round, and reactive to light.  Cardiovascular:     Rate and Rhythm: Tachycardia present. Rhythm irregular.     Heart sounds: Normal heart sounds.  Pulmonary:     Effort: Pulmonary effort is normal.     Breath sounds: Normal breath sounds.  Abdominal:     General: Abdomen is flat.     Palpations: Abdomen is soft.  Musculoskeletal:     Cervical back: Normal range of motion.  Neurological:     Mental Status: He is alert.     (all labs ordered are listed, but only abnormal results are displayed) Labs Reviewed  BASIC METABOLIC PANEL WITH GFR - Abnormal; Notable for the following components:      Result Value   Glucose, Bld 143 (*)    All other components within normal limits  CBC - Abnormal; Notable for the following components:   RDW 17.0 (*)    All other components within normal limits  MAGNESIUM  TROPONIN I (HIGH SENSITIVITY)    EKG: None  Radiology: No results found.   Procedures   Medications Ordered in the ED  lactated ringers bolus 1,000 mL (1,000 mLs Intravenous New Bag/Given 06/20/24 2209)                                    Medical Decision Making Amount and/or  Complexity of Data Reviewed Labs: ordered.  Risk Prescription drug management.   78 year old with PMH as above presenting to the ED with symptomatic mixed atrial flutter/atrial fibrillation with RVR with rate approximately 130-140 beginning earlier tonight, appreciated on Apple Watch ~18:15.  Patient otherwise hemodynamically stable.  Normotensive.  Mentating appropriately.  No recent infectious symptoms.  No chest pain.  Upon arrival, patient  afebrile.  No tachypnea.  Saturating 99% RA.  Bedside US  indicative of qualitatively preserved EF with no LV thrombus appreciated.  Patient clinically well-perfused with warm, distal extremities and brisk capillary refill.  Renal function WNL.  Troponin 9 -No active chest pain.  Doubt ACS at this time.  Potassium 4.1.  No leukocytosis or anemia.  Magnesium 2.3.  Cardiology team consulted and DCCV discussed -however, patient with spontaneous rate control with atrial fibrillation and rates 70-80.  Upon reevaluation with cardiology team, cardioversion not indicated at this time.  Pending workup completion, tentative plan to escalate diltiazem  from 120 to 180 mg with as needed 30 mg diltiazem  maintained following EP outpatient follow-up.  At this time, no indication for further emergent evaluation or hospitalization.  Patient updated regarding plan of care.  All questions answered at bedside.  Strict return precautions discussed  Final diagnoses:  Atrial fibrillation with RVR [facility])    ED Discharge Orders          Ordered    diltiazem  (CARDIZEM  CD) 180 MG 24 hr capsule  Daily        06/20/24 2207               Dorcus Fallow, MD 06/20/24 2210    Simon Lavonia SAILOR, MD 06/20/24 2324

## 2024-06-20 NOTE — Consult Note (Signed)
 Brief Cardiology Note  [name] presenting initially with rapid atrial flutter detected by Apple Watch, mostly asymptomatic except for mild SOB and some chills.  AF ablation in July, prior CABG, hospitalized in August with Aflutter requiring DCCV and has been on cardizem  120 plus apixaban .  Seen in EP clinic on 9/11, also being considered for Watchman device.    In the ED, initial ECG showed Aflutter with rate in the 130s, then flipped to Afib rate controlled in the 70s.  Still mostly asymptomatic.  Labs unremarkable  Recommend continue Cardizem , increase dose to 180 daily, as well as apixaban .  He is still in the monitoring window post-ablation.  Will have him follow-up as an outpatient with Dr. Mealor for consideration of subsequent Afib management, in coordination with potential conversion of Apixaban  to a Watchman.  [name] Card[facility] on call.

## 2024-06-20 NOTE — ED Triage Notes (Signed)
 The  pt has been in af since 1800  1820 he took diltizam he has been in af previously and was cardioverted    no chest pain sl diff breathing and some chills

## 2024-06-23 DIAGNOSIS — I2510 Atherosclerotic heart disease of native coronary artery without angina pectoris: Secondary | ICD-10-CM | POA: Diagnosis not present

## 2024-06-23 DIAGNOSIS — I10 Essential (primary) hypertension: Secondary | ICD-10-CM | POA: Diagnosis not present

## 2024-06-23 DIAGNOSIS — E1142 Type 2 diabetes mellitus with diabetic polyneuropathy: Secondary | ICD-10-CM | POA: Diagnosis not present

## 2024-06-23 DIAGNOSIS — E7800 Pure hypercholesterolemia, unspecified: Secondary | ICD-10-CM | POA: Diagnosis not present

## 2024-06-25 ENCOUNTER — Ambulatory Visit (HOSPITAL_COMMUNITY)
Admission: RE | Admit: 2024-06-25 | Discharge: 2024-06-25 | Disposition: A | Discharge status: Home / Self Care | Source: Ambulatory Visit | Attending: Cardiology | Admitting: Cardiology

## 2024-06-25 DIAGNOSIS — I484 Atypical atrial flutter: Secondary | ICD-10-CM | POA: Diagnosis not present

## 2024-06-25 DIAGNOSIS — I517 Cardiomegaly: Secondary | ICD-10-CM | POA: Diagnosis not present

## 2024-06-25 DIAGNOSIS — I4819 Other persistent atrial fibrillation: Secondary | ICD-10-CM | POA: Insufficient documentation

## 2024-06-25 DIAGNOSIS — Z01818 Encounter for other preprocedural examination: Secondary | ICD-10-CM | POA: Diagnosis not present

## 2024-06-25 DIAGNOSIS — I483 Typical atrial flutter: Secondary | ICD-10-CM | POA: Diagnosis not present

## 2024-06-25 DIAGNOSIS — Z951 Presence of aortocoronary bypass graft: Secondary | ICD-10-CM | POA: Diagnosis not present

## 2024-06-25 DIAGNOSIS — D6869 Other thrombophilia: Secondary | ICD-10-CM | POA: Insufficient documentation

## 2024-06-25 MED ORDER — IOHEXOL 350 MG/ML SOLN
100.0000 mL | Freq: Once | INTRAVENOUS | Status: AC | PRN
  Administered 2024-06-25: 100 mL via INTRAVENOUS

## 2024-07-02 ENCOUNTER — Ambulatory Visit: Admitting: Pulmonary Disease

## 2024-07-03 ENCOUNTER — Ambulatory Visit: Attending: Pulmonary Disease | Admitting: Pulmonary Disease

## 2024-07-03 ENCOUNTER — Encounter: Payer: Self-pay | Admitting: Pulmonary Disease

## 2024-07-03 VITALS — BP 120/62 | HR 64 | Ht 69.0 in | Wt 206.0 lb

## 2024-07-03 DIAGNOSIS — Z951 Presence of aortocoronary bypass graft: Secondary | ICD-10-CM | POA: Insufficient documentation

## 2024-07-03 DIAGNOSIS — D6869 Other thrombophilia: Secondary | ICD-10-CM | POA: Insufficient documentation

## 2024-07-03 DIAGNOSIS — R296 Repeated falls: Secondary | ICD-10-CM | POA: Diagnosis not present

## 2024-07-03 DIAGNOSIS — I4819 Other persistent atrial fibrillation: Secondary | ICD-10-CM | POA: Insufficient documentation

## 2024-07-03 DIAGNOSIS — I2510 Atherosclerotic heart disease of native coronary artery without angina pectoris: Secondary | ICD-10-CM | POA: Diagnosis not present

## 2024-07-03 DIAGNOSIS — I483 Typical atrial flutter: Secondary | ICD-10-CM | POA: Diagnosis not present

## 2024-07-03 MED ORDER — DILTIAZEM HCL ER COATED BEADS 180 MG PO CP24: 180.0000 mg | ORAL_CAPSULE | Freq: Every day | ORAL | 3 refills | Status: AC

## 2024-07-03 NOTE — Patient Instructions (Signed)
 Medication Instructions:   STOP TAKING :  PLAVIX    *If you need a refill on your cardiac medications before your next appointment, please call your pharmacy*  Lab Work: NONE ORDERED  TODAY   If you have labs (blood work) drawn today and your tests are completely normal, you will receive your results only by: MyChart Message (if you have MyChart) OR A paper copy in the mail If you have any lab test that is abnormal or we need to change your treatment, we will call you to review the results.  Testing/Procedures: NONE ORDERED  TODAY    Follow-Up: At [facility], you and your health needs are our priority.  As part of our continuing mission to provide you with exceptional heart care, our providers are all part of one team.  This team includes your primary Cardiologist (physician) and Advanced Practice Providers or APPs (Physician Assistants and Nurse Practitioners) who all work together to provide you with the care you need, when you need it.  Your next appointment:  AS SCHEDULED     We recommend signing up for the patient portal called MyChart.  Sign up information is provided on this After Visit Summary.  MyChart is used to connect with patients for Virtual Visits (Telemedicine).  Patients are able to view lab/test results, encounter notes, upcoming appointments, etc.  Non-urgent messages can be sent to your provider as well.   To learn more about what you can do with MyChart, go to [email or web address].   Other Instructions

## 2024-07-03 NOTE — Progress Notes (Signed)
 Electrophysiology Office Note:   Date:  07/03/2024  ID:  HENDRICKS SCHWANDT, DOB 1946-07-05, MRN [id number]  Primary Cardiologist: None Primary Heart Failure: None Electrophysiologist: Eulas FORBES Furbish, MD      History of Present Illness:   Ruben [name] is a 78 y.o. male with h/o typical AF / AFL s/p ablation, CAD s/p CABG & PCI seen today for routine electrophysiology followup.   Admitted 8/26-8/27/25 for symptomatic paroxysmal atrial flutter with RVR.  He was started on a cardizem  gtt and underwent DCCV (one shock, NSR).  He was recommended for cardizem  120 mg daily and to continue Eliquis .   Seen in EP Clinic 06/04/24 & pt was in NSR at that time. However, he reported episodes of AF in the early am.  AAD options were discussed. He was referred to Dr. Kennyth for LAAO consideration. He was seen by Dr. Kennyth on 06/16/24 and planned for LAAO.  He was subsequently seen in ER on 06/20/24 for AF/AFL.  Cardizem  was increased to 180mg  daily.  DCCV was deferred.   Since last being seen in our clinic the patient reports doing well since the ER visit. He has not had further evidence of AF.  He is tolerating the increased dose of Cardizem  well. No bleeding on Plavix  and Eliquis .  He denies chest pain, palpitations, dyspnea, PND, orthopnea, nausea, vomiting, dizziness, syncope, edema, weight gain, or early satiety.   Review of systems complete and found to be negative unless listed in HPI.   EP Information / Studies Reviewed:    EKG is ordered today. Personal review as below.  EKG Interpretation Date/Time:  Friday July 03 2024 15:26:42 EDT Ventricular Rate:  65 PR Interval:  264 QRS Duration:  106 QT Interval:  400 QTC Calculation: 416 R Axis:   -31  Text Interpretation: Sinus rhythm with 1st degree A-V block Left axis deviation Confirmed by Aniceto Jarvis ([id number]) on 07/03/2024 3:38:49 PM   Arrhythmia / AAD / Pertinent EP Studies Typical Atrial Flutter  EPS 04/2017 > RF ablation of CTI   Atrial Fibrillation  EPS 03/26/24 > PF ablation of PVI, PW. CTI blocked from prior ablation.  DCCV 05/20/24 > 200j converted to SB  Risk Assessment/Calculations:    CHA2DS2-VASc Score = 5   This indicates a 7.2% annual risk of stroke. The patient's score is based upon: CHF History: 0 HTN History: 1 Diabetes History: 1 Stroke History: 0 Vascular Disease History: 1 Age Score: 2 Gender Score: 0             Physical Exam:   VS:  BP 120/62   Pulse 64   Ht 5' 9 (1.753 m)   Wt 206 lb (93.4 kg)   SpO2 95%   BMI 30.42 kg/m    Wt Readings from Last 3 Encounters:  07/03/24 206 lb (93.4 kg)  06/20/24 204 lb 5.9 oz (92.7 kg)  06/16/24 204 lb 4.8 oz (92.7 kg)     GEN: Well nourished, well developed in no acute distress NECK: No JVD; No carotid bruits CARDIAC: Regular rate and rhythm, no murmurs, rubs, gallops RESPIRATORY:  Clear to auscultation without rales, wheezing or rhonchi  ABDOMEN: Soft, non-tender, non-distended EXTREMITIES:  No edema; No deformity   ASSESSMENT AND PLAN:    Paroxysmal Atrial Fibrillation  Typical Atrial Flutter  CHA2DS2-VASc 5, s/p PVI+PW and CTI ablation -OAC for stroke prophylaxis  -previously referred to Dr. Kennyth for consideration of LAAO, work up in progress  -EKG with NSR  -cardizem  180  mg   Secondary Hypercoagulable State  -continue Eliquis  5mg  BID, dose reviewed and appropriate by age / wt   1AVB Bradycardia  -asymptomatic, monitor   CAD s/p PCI, CABG  -no anginal symptoms    -plavix  75mg  > reviewed with DOD in clinic (Dr. Ladona) and ok to stop plavix  while on full dose OAC   OSA  -pending sleep study     Follow up with Dr. Nancey / Dr. Kennyth for LAAO once scheduled.    Signed, Daphne Barrack, NP-C, AGACNP-BC [facility] - Electrophysiology  07/03/2024, 4:30 PM

## 2024-07-06 ENCOUNTER — Ambulatory Visit: Payer: Self-pay | Admitting: Cardiology

## 2024-07-06 DIAGNOSIS — I4892 Unspecified atrial flutter: Secondary | ICD-10-CM

## 2024-07-13 DIAGNOSIS — M9901 Segmental and somatic dysfunction of cervical region: Secondary | ICD-10-CM | POA: Diagnosis not present

## 2024-07-13 DIAGNOSIS — M9902 Segmental and somatic dysfunction of thoracic region: Secondary | ICD-10-CM | POA: Diagnosis not present

## 2024-07-13 DIAGNOSIS — M9903 Segmental and somatic dysfunction of lumbar region: Secondary | ICD-10-CM | POA: Diagnosis not present

## 2024-07-13 DIAGNOSIS — M9904 Segmental and somatic dysfunction of sacral region: Secondary | ICD-10-CM | POA: Diagnosis not present

## 2024-07-14 ENCOUNTER — Other Ambulatory Visit: Payer: Self-pay

## 2024-07-14 DIAGNOSIS — R296 Repeated falls: Secondary | ICD-10-CM

## 2024-07-14 DIAGNOSIS — I4819 Other persistent atrial fibrillation: Secondary | ICD-10-CM

## 2024-07-14 NOTE — Telephone Encounter (Signed)
 Spoke with patient. Arranged Watchman 10/15/2024 at 0930 with Dr. Kennyth.  Arranged pre-procedure visit with Daphne Barrack, NP 09/25/2024 at 10:30.

## 2024-07-16 ENCOUNTER — Telehealth: Payer: Self-pay

## 2024-07-16 NOTE — Telephone Encounter (Signed)
 Ruben [name]

## 2024-07-20 ENCOUNTER — Telehealth: Payer: Self-pay

## 2024-07-20 DIAGNOSIS — M9903 Segmental and somatic dysfunction of lumbar region: Secondary | ICD-10-CM | POA: Diagnosis not present

## 2024-07-20 DIAGNOSIS — M9904 Segmental and somatic dysfunction of sacral region: Secondary | ICD-10-CM | POA: Diagnosis not present

## 2024-07-20 DIAGNOSIS — M9902 Segmental and somatic dysfunction of thoracic region: Secondary | ICD-10-CM | POA: Diagnosis not present

## 2024-07-20 DIAGNOSIS — M9901 Segmental and somatic dysfunction of cervical region: Secondary | ICD-10-CM | POA: Diagnosis not present

## 2024-07-20 NOTE — Telephone Encounter (Signed)
 A portion of the appendage is not visible where the study is slices through the LAA. Max os 28/ AVG 25/ Depth 15.6 (more depth likely available if the whole appendage were visible) Likely use a 31 mm device Inf/Mid TSP RAO 14 CAU 12

## 2024-07-24 DIAGNOSIS — I10 Essential (primary) hypertension: Secondary | ICD-10-CM | POA: Diagnosis not present

## 2024-07-24 DIAGNOSIS — I2510 Atherosclerotic heart disease of native coronary artery without angina pectoris: Secondary | ICD-10-CM | POA: Diagnosis not present

## 2024-07-24 DIAGNOSIS — E7800 Pure hypercholesterolemia, unspecified: Secondary | ICD-10-CM | POA: Diagnosis not present

## 2024-07-24 DIAGNOSIS — E1142 Type 2 diabetes mellitus with diabetic polyneuropathy: Secondary | ICD-10-CM | POA: Diagnosis not present

## 2024-07-27 DIAGNOSIS — Z23 Encounter for immunization: Secondary | ICD-10-CM | POA: Diagnosis not present

## 2024-07-29 DIAGNOSIS — G894 Chronic pain syndrome: Secondary | ICD-10-CM | POA: Diagnosis not present

## 2024-07-29 DIAGNOSIS — G609 Hereditary and idiopathic neuropathy, unspecified: Secondary | ICD-10-CM | POA: Diagnosis not present

## 2024-08-04 DIAGNOSIS — G4733 Obstructive sleep apnea (adult) (pediatric): Secondary | ICD-10-CM | POA: Diagnosis not present

## 2024-08-10 ENCOUNTER — Other Ambulatory Visit: Payer: Self-pay | Admitting: General Practice

## 2024-08-10 DIAGNOSIS — Z951 Presence of aortocoronary bypass graft: Secondary | ICD-10-CM

## 2024-08-10 DIAGNOSIS — I2510 Atherosclerotic heart disease of native coronary artery without angina pectoris: Secondary | ICD-10-CM

## 2024-08-12 MED ORDER — ROSUVASTATIN CALCIUM 40 MG PO TABS: 40.0000 mg | ORAL_TABLET | Freq: Every day | ORAL | 0 refills | Status: DC

## 2024-08-12 MED ORDER — LOSARTAN POTASSIUM 25 MG PO TABS: 25.0000 mg | ORAL_TABLET | Freq: Every day | ORAL | 0 refills | Status: AC

## 2024-08-18 DIAGNOSIS — G4733 Obstructive sleep apnea (adult) (pediatric): Secondary | ICD-10-CM | POA: Diagnosis not present

## 2024-08-22 DIAGNOSIS — Z23 Encounter for immunization: Secondary | ICD-10-CM | POA: Diagnosis not present

## 2024-09-03 ENCOUNTER — Other Ambulatory Visit: Payer: Self-pay

## 2024-09-04 ENCOUNTER — Ambulatory Visit: Attending: Cardiovascular Disease | Admitting: Cardiovascular Disease

## 2024-09-04 ENCOUNTER — Encounter: Payer: Self-pay | Admitting: Cardiovascular Disease

## 2024-09-04 VITALS — BP 120/60 | HR 55 | Ht 69.0 in | Wt 208.2 lb

## 2024-09-04 DIAGNOSIS — I10 Essential (primary) hypertension: Secondary | ICD-10-CM | POA: Diagnosis present

## 2024-09-04 DIAGNOSIS — I4819 Other persistent atrial fibrillation: Secondary | ICD-10-CM | POA: Diagnosis present

## 2024-09-04 DIAGNOSIS — E782 Mixed hyperlipidemia: Secondary | ICD-10-CM | POA: Diagnosis present

## 2024-09-04 DIAGNOSIS — I350 Nonrheumatic aortic (valve) stenosis: Secondary | ICD-10-CM | POA: Diagnosis present

## 2024-09-04 DIAGNOSIS — I25119 Atherosclerotic heart disease of native coronary artery with unspecified angina pectoris: Secondary | ICD-10-CM | POA: Diagnosis not present

## 2024-09-04 MED ORDER — APIXABAN 5 MG PO TABS: 5.0000 mg | ORAL_TABLET | Freq: Two times a day (BID) | ORAL | 3 refills | Status: AC

## 2024-09-04 MED ORDER — ROSUVASTATIN CALCIUM 40 MG PO TABS: 40.0000 mg | ORAL_TABLET | Freq: Every day | ORAL | 3 refills | Status: AC

## 2024-09-04 MED ORDER — RANOLAZINE ER 1000 MG PO TB12: 1000.0000 mg | ORAL_TABLET | Freq: Two times a day (BID) | ORAL | 3 refills | Status: AC

## 2024-09-04 MED ORDER — ISOSORBIDE MONONITRATE ER 60 MG PO TB24: ORAL_TABLET | ORAL | 3 refills | Status: AC

## 2024-09-04 MED ORDER — SPIRONOLACTONE 25 MG PO TABS: ORAL_TABLET | ORAL | 3 refills | Status: AC

## 2024-09-04 NOTE — Progress Notes (Signed)
 Cardiology Office Note:    Date:  09/04/2024   ID:  Ruben [name], DOB 1945/11/24, MRN [id number]  PCP:  Regino Slater, MD   [facility] Providers Cardiologist:  None Electrophysiologist:  Eulas FORBES Furbish, MD     Referring MD: Regino Slater, MD   Chief Complaint  Patient presents with   Coronary Artery Disease    History of Present Illness:    Ruben [name] is a 78 y.o. male with a hx of coronary artery disease and paroxysmal atrial fibrillation.  The patient has a history of CABG and PCI.  He has been followed by our EP service.  He was seen by Dr. Alveta in May 2025 when he was found to have atrial flutter in the emergency department.  He underwent remote CABG in 1997 and coronary stenting in 2009.  The patient is undergoing evaluation for left atrial appendage occlusion.  His most recent heart catheterization in 2020 showed stable coronary artery disease compared to his catheterization from 2013.  He has known occlusions of the ostial LAD, proximal circumflex, and saphenous vein graft to OM.  He has a patent LIMA to LAD, saphenous vein to first diagonal, and saphenous vein to right PDA with a patent stent in that graft.  Ongoing medical therapy was recommended.  The patient is here with his wife today. He continues to remain relatively active with walking on the treadmill for 30 minutes 2-3 times/week and doing yard work. He denies chest pain or chest pressure. He expresses concerns about low heart rate today. States that at times his heart rate is in the 40's when he is resting. He had recurrent AF on cardizem  120 mg and it was increased to 180 mg daily.  The patient feels pretty groggy in the mornings for the about the first 30 minutes of the day.  He has undergone CPAP titration and is going to start using the device in the near future.  He has no exertional chest pain or pressure.  Denies shortness of breath.  No other complaints today.  Current Medications: Active  Medications[1]   Allergies:   Nortriptyline, Cymbalta [duloxetine hcl], and Niacin    ROS:   Please see the history of present illness.    All other systems reviewed and are negative.  EKGs/Labs/Other Studies Reviewed:    The following studies were reviewed today: Cardiac Studies & Procedures   ______________________________________________________________________________________________ CARDIAC CATHETERIZATION  CARDIAC CATHETERIZATION 09/29/2018  Conclusion Images from the original result were not included.   Native Vessels. Stable.  Prox RCA to Mid RCA stent is 35% stenosed. Mid RCA to Dist RCA stent is 40% stenosed. Dist RCA lesion is 55% stenosed with 65% stenosed side branch in Ost RPDA.  Ost LAD to Prox LAD lesion is 100% stenosed. Prox LAD lesion is 90% stenosed. Prox LAD to Mid LAD lesion is 100% stenosed.  Previously placed Ost LM to Dist LM stent (unknown type) is widely patent with 0% stenosed side branch in Ost Ramus. Lat Ramus lesion is 30% stenosed.  Prox Cx to Mid Cx lesion is 100% stenosed. Ost 1st Mrg lesion is 100% stenosed.  ------------------  GRAFTS - Stable  LIMA-mLAD graft was not injected and is large. The graft exhibits a focal 40% lesion in a kink otherwise no disease. With normal antegrade flow  SVG-OM known to be 100% CTO.  SVG-1st Diag graft was visualized by angiography and is large. The graft exhibits no disease.  ------------------  SVG-rPDA graft was not  injected and is large. The graft exhibits no disease. Previously placed Mid Graft to Dist Graft stent (unknown type) is widely patent.  LV end diastolic pressure is mildly elevated.  SUMMARY  Overall stable native coronary and graft disease but no significant changes from 2013 cardiac catheterization  Known CTO of ostial LAD, proximal circumflex-OM and SVG-OM.  Widely patent LIMA-LAD (with focal area of 40% stenosis at a kink), SVG-1stDiag and SVG-RPDA (with patent stent)  Mildly  elevated LVEDP  RECOMMENDATIONS  Continue medical management.  Will increase Imdur  to 60 mg.  Anticipate that he should be able to discharge later on today after bedrest.    Alm Clay, M.D., M.S. Interventional Cardiologist  Pager # [phone] Phone # [phone] [address] [place], [place] [address]  Findings Coronary Findings Diagnostic  Dominance: Right  Left Main Previously placed Ost LM to Dist LM stent (unknown type) is widely patent with 0% stenosed side branch in Ost Ramus. The lesion is located at the bifurcation.  Left Anterior Descending Ost LAD to Prox LAD lesion is 100% stenosed. The lesion is chronically occluded. Prox LAD lesion is 90% stenosed. Prox LAD to Mid LAD lesion is 100% stenosed. The lesion is chronically occluded.  First Diagonal Branch Vessel is moderate in size.  First Septal Branch Vessel is small in size.  Second Septal Branch Vessel is small in size.  Ramus Intermedius Vessel is large.  Lateral Ramus Intermedius Lat Ramus lesion is 30% stenosed.  Left Circumflex Vessel is small. Prox Cx to Mid Cx lesion is 100% stenosed. The lesion is chronically occluded.  First Obtuse Marginal Branch Ost 1st Mrg lesion is 100% stenosed. The lesion is chronically occluded.  Right Coronary Artery Vessel is large. There is moderate diffuse disease throughout the vessel. Prox RCA to Mid RCA lesion is 35% stenosed. The lesion is concentric and irregular. The lesion was previously treated using a stent (unknown type) over 2 years ago. Previously placed stent displays restenosis. Similar to 2013 Mid RCA to Dist RCA lesion is 40% stenosed. The lesion is located at the bend. The lesion was previously treated using a stent (unknown type) over 2 years ago. Dist RCA lesion is 55% stenosed with 65% stenosed side branch in Ost RPDA. The lesion is located at the bifurcation. Similar to 2013  Acute Marginal Branch Vessel is small in  size.  First Right Posterolateral Branch Vessel is small in size.  LIMA LIMA Graft To Mid LAD LIMA graft was not injected and is large. The graft exhibits no disease. Mid Graft lesion is 40% stenosed. The lesion is located at the bend. Appears to be mechanical kink  Saphenous Graft To Ost 1st Diag SVG graft was visualized by angiography and is large. The graft exhibits no disease.  Saphenous Graft To RPDA SVG graft was not injected and is large. Previously placed Mid Graft to Dist Graft stent (unknown type) is widely patent.  Intervention  No interventions have been documented.   STRESS TESTS  MYOCARDIAL PERFUSION IMAGING 04/20/2021  Interpretation Summary  Nuclear stress EF: 63%.  The left ventricular ejection fraction is normal (55-65%).  There was no ST segment deviation noted during stress.  No T wave inversion was noted during stress.  The study is normal.  This is a low risk study with no evidence of ischemia.   ECHOCARDIOGRAM  ECHOCARDIOGRAM COMPLETE 05/07/2024  Narrative ECHOCARDIOGRAM REPORT    Patient Name:   Ruben [name] Date of Exam: 05/07/2024 Medical Rec #:  [id number]       Height:       69.0 in Accession #:    [id number]      Weight:       200.0 lb Date of Birth:  [birth year]       BSA:          2.066 m Patient Age:    77 years        BP:           116/71 mmHg Patient Gender: M               HR:           62 bpm. Exam Location:  [address]  Procedure: 2D Echo, 3D Echo, Cardiac Doppler and Color Doppler (Both Spectral and Color Flow Doppler were utilized during procedure).  Indications:    I35.9 Aortic Valve Disorder  History:        Patient has prior history of Echocardiogram examinations, most recent 05/23/2023. CAD, Prior CABG, Aortic Valve Disease, Arrythmias:Atrial Flutter and Atrial Fibrillation; Risk Factors:Hypertension, Dyslipidemia, Diabetes and Family History of Coronary Artery Disease.  Sonographer:    Heather Hawks  RDCS Referring Phys: ELFRIEDA HAGGARD  IMPRESSIONS   1. Left ventricular ejection fraction, by estimation, is 60 to 65%. Left ventricular ejection fraction by 3D volume is 67 %. The left ventricle has normal function. The left ventricle has no regional wall motion abnormalities. There is mild concentric left ventricular hypertrophy. Left ventricular diastolic parameters were normal. 2. Right ventricular systolic function is normal. The right ventricular size is normal. 3. Left atrial size was mildly dilated. 4. The mitral valve is normal in structure. Trivial mitral valve regurgitation. No evidence of mitral stenosis. 5. The aortic valve is tricuspid. There is mild calcification of the aortic valve. Aortic valve regurgitation is mild. Mild to moderate aortic valve stenosis. Aortic regurgitation PHT measures 407 msec. Aortic valve area, by VTI measures 1.88 cm. Aortic valve mean gradient measures 9.6 mmHg. Aortic valve Vmax measures 2.38 m/s. 6. Aortic dilatation noted. There is borderline dilatation of the ascending aorta, measuring 40 mm. 7. The inferior vena cava is normal in size with greater than 50% respiratory variability, suggesting right atrial pressure of 3 mmHg.  FINDINGS Left Ventricle: Left ventricular ejection fraction, by estimation, is 60 to 65%. Left ventricular ejection fraction by 3D volume is 67 %. The left ventricle has normal function. The left ventricle has no regional wall motion abnormalities. The left ventricular internal cavity size was normal in size. There is mild concentric left ventricular hypertrophy. Left ventricular diastolic parameters were normal.  Right Ventricle: The right ventricular size is normal. No increase in right ventricular wall thickness. Right ventricular systolic function is normal.  Left Atrium: Left atrial size was mildly dilated.  Right Atrium: Right atrial size was normal in size.  Pericardium: There is no evidence of pericardial  effusion.  Mitral Valve: The mitral valve is normal in structure. Trivial mitral valve regurgitation. No evidence of mitral valve stenosis.  Tricuspid Valve: The tricuspid valve is normal in structure. Tricuspid valve regurgitation is trivial. No evidence of tricuspid stenosis.  Aortic Valve: The aortic valve is tricuspid. There is mild calcification of the aortic valve. Aortic valve regurgitation is mild. Aortic regurgitation PHT measures 407 msec. Mild to moderate aortic stenosis is present. Aortic valve mean gradient measures 9.6 mmHg. Aortic valve peak gradient measures 22.6 mmHg. Aortic valve area, by VTI measures 1.88 cm.  Pulmonic Valve: The pulmonic valve was  normal in structure. Pulmonic valve regurgitation is mild. No evidence of pulmonic stenosis.  Aorta: Aortic dilatation noted. There is borderline dilatation of the ascending aorta, measuring 40 mm.  Venous: The inferior vena cava is normal in size with greater than 50% respiratory variability, suggesting right atrial pressure of 3 mmHg.  IAS/Shunts: No atrial level shunt detected by color flow Doppler.  Additional Comments: 3D was performed not requiring image post processing on an independent workstation and was normal.   LEFT VENTRICLE PLAX 2D LVIDd:         5.00 cm         Diastology LVIDs:         2.90 cm         LV e' medial:    9.36 cm/s LV PW:         0.90 cm         LV E/e' medial:  8.0 LV IVS:        1.10 cm         LV e' lateral:   15.90 cm/s LVOT diam:     2.70 cm         LV E/e' lateral: 4.7 LV SV:         105 LV SV Index:   51 LVOT Area:     5.73 cm        3D Volume EF LV 3D EF:    Left ventricul ar ejection fraction by 3D volume is 67 %.  3D Volume EF: 3D EF:        67 % LV EDV:       172 ml LV ESV:       57 ml LV SV:        115 ml  RIGHT VENTRICLE RV Basal diam:  4.00 cm RV S prime:     13.90 cm/s TAPSE (M-mode): 1.9 cm  LEFT ATRIUM              Index        RIGHT ATRIUM            Index LA diam:        5.20 cm  2.52 cm/m   RA Pressure: 3.00 mmHg LA Vol (A2C):   121.0 ml 58.57 ml/m  RA Area:     17.00 cm LA Vol (A4C):   103.0 ml 49.85 ml/m  RA Volume:   46.90 ml  22.70 ml/m LA Biplane Vol: 119.0 ml 57.60 ml/m AORTIC VALVE AV Area (Vmax):    2.03 cm AV Area (Vmean):   2.32 cm AV Area (VTI):     1.88 cm AV Vmax:           237.60 cm/s AV Vmean:          136.800 cm/s AV VTI:            0.559 m AV Peak Grad:      22.6 mmHg AV Mean Grad:      9.6 mmHg LVOT Vmax:         84.40 cm/s LVOT Vmean:        55.550 cm/s LVOT VTI:          0.183 m LVOT/AV VTI ratio: 0.33 AI PHT:            407 msec  AORTA Ao Root diam: 3.70 cm Ao Asc diam:  3.95 cm  MITRAL VALVE  TRICUSPID VALVE MV Area (PHT)  cm         Estimated RAP:  3.00 mmHg MV Decel Time: 194 msec MV E velocity: 74.70 cm/s  SHUNTS MV A velocity: 57.15 cm/s  Systemic VTI:  0.18 m MV E/A ratio:  1.31        Systemic Diam: 2.70 cm  Toribio Fuel MD Electronically signed by Toribio Fuel MD Signature Date/Time: 05/07/2024/7:06:22 PM    Final   TEE  ECHO TEE 02/04/2017  Narrative *[place]* *[facility]* [address] [place], [place] [address] [phone]  ------------------------------------------------------------------- Transesophageal Echocardiography  Patient:    Ruben [name], Ruben [name] MR #:       [id number] Study Date: 02/04/2017 Gender:     M Age:        85 Height:     177.8 cm Weight:     92.9 kg BSA:        2.16 m^2 Pt. Status: Room:       3W19C  ATTENDING    Aleene Passe, M.D. ORDERING     Bensimhon, Daniel REFERRING    Bensimhon, Daniel PERFORMING   Leim Moose, M.D. SONOGRAPHER  [facility] ADMITTING    Lonni, [place] SONOGRAPHER  Fairy [place], RDCS  cc:  -------------------------------------------------------------------  ------------------------------------------------------------------- Indications:      Atrial  flutter 427.32.  ------------------------------------------------------------------- History:   PMH:   Coronary artery disease.  Risk factors: Hypertension. Diabetes mellitus.  ------------------------------------------------------------------- Study Conclusions  - Left ventricle: Systolic function was normal. Wall motion was normal; there were no regional wall motion abnormalities. - Aortic valve: There was mild regurgitation. - Ascending aorta: The ascending aorta was normal in size. - Left atrium: The atrium was dilated. No evidence of thrombus in the atrial cavity or appendage. No evidence of thrombus in the atrial cavity or appendage. - Right ventricle: Systolic function was normal. - Right atrium: No evidence of thrombus in the atrial cavity or appendage. - Atrial septum: No defect or patent foramen ovale was identified. - Pericardium, extracardiac: There was no pericardial effusion.  Impressions:  - The atrium was mildly dilated. No thrombus in the left atrium or left atrial appendage. Normal emptying and filling velocities.  ------------------------------------------------------------------- Study data:   Study status:  Routine.  Consent:  The risks, benefits, and alternatives to the procedure were explained to the patient and informed consent was obtained.  Procedure:  Initial setup. The patient was brought to the laboratory. Surface ECG leads were monitored. Sedation. Conscious sedation was administered by cardiology staff. Transesophageal echocardiography. An adult multiplane transesophageal probe was inserted by the attending cardiologistwithout difficulty. Image quality was adequate.  Study completion:  The patient tolerated the procedure well. There were no complications.  Administered medications:   Fentanyl , 50mcg, IV. Midazolam , 6mg , IV.          Diagnostic transesophageal echocardiography.  2D and color Doppler.  Birthdate:  Patient birthdate: [birth year].   Age:  Patient is 78 yr old.  Sex:  Gender: male.    BMI: 29.4 kg/m^2.  Blood pressure:     98/65  Patient status:  Inpatient.  Study date:  Study date: 02/04/2017. Study time: 01:17 PM.  Location:  Endoscopy.  -------------------------------------------------------------------  ------------------------------------------------------------------- Left ventricle:  Systolic function was normal. Wall motion was normal; there were no regional wall motion abnormalities.  ------------------------------------------------------------------- Aortic valve:   Structurally normal valve. Trileaflet; normal thickness leaflets. Cusp separation was normal.  Doppler:  There was mild regurgitation.  ------------------------------------------------------------------- Aorta:  There was  no evidence for dissection. Aortic root: The aortic root was not dilated. Ascending aorta: The ascending aorta was normal in size.  ------------------------------------------------------------------- Mitral valve:   Structurally normal valve.   Leaflet separation was normal.  Doppler:  There was trivial regurgitation.  ------------------------------------------------------------------- Left atrium:  The atrium was dilated.  No evidence of thrombus in the atrial cavity or appendage.  No evidence of thrombus in the atrial cavity or appendage. The appendage was morphologically a left appendage, multilobulated, and of normal size. Emptying velocity was normal.  ------------------------------------------------------------------- Atrial septum:  No defect or patent foramen ovale was identified.  ------------------------------------------------------------------- Right ventricle:  The cavity size was normal. Wall thickness was normal. Systolic function was normal.  ------------------------------------------------------------------- Pulmonic valve:    Structurally normal  valve.  ------------------------------------------------------------------- Tricuspid valve:   Structurally normal valve.   Leaflet separation was normal.  Doppler:  There was mild regurgitation.  ------------------------------------------------------------------- Pulmonary artery:   The main pulmonary artery was normal-sized.  ------------------------------------------------------------------- Right atrium:  The atrium was normal in size.  No evidence of thrombus in the atrial cavity or appendage. The appendage was morphologically a right appendage.  ------------------------------------------------------------------- Pericardium:  There was no pericardial effusion.  ------------------------------------------------------------------- Measurements  Aortic valve                          Value Aortic regurg pressure half-time      365   ms  Legend: (L)  and  (H)  mark values outside specified reference range.  ------------------------------------------------------------------- Prepared and Electronically Authenticated by  Leim Moose, M.D. 2018-05-14T14:18:27  MONITORS  CARDIAC EVENT MONITOR 09/10/2018  Narrative Patient was monitored from December 18 through September 23, 2018.  Predominant rhythm was sinus with first-degree AV block.  The average heart rate was 57 bpm.  The minimal heart rate was 45 bpm and maximal heart rate 90 bpm.  There was one episode of 7 beats of a wide-complex irregular tachycardia with rate varying from 105 up to 171 which occurred on December 29 at 4:59 AM.  There are no pauses.  There were no other episodes of arrhythmia, atrial fibrillation or atrial flutter.       ______________________________________________________________________________________________      EKG:        Recent Labs: 05/19/2024: B Natriuretic Peptide 100.3; TSH 1.139 06/20/2024: BUN 19; Creatinine, Ser 0.98; Hemoglobin 15.1; Magnesium 2.3; Platelets 242; Potassium  4.1; Sodium 138  Recent Lipid Panel    Component Value Date/Time   CHOL 110 09/28/2018 0046   CHOL 125 06/11/2018 0819   CHOL 127 11/09/2015 0854   CHOL 107 02/12/2014 0801   TRIG 37 09/28/2018 0046   TRIG 57 11/09/2015 0854   TRIG 37 02/12/2014 0801   HDL 51 09/28/2018 0046   HDL 55 06/11/2018 0819   HDL 55 11/09/2015 0854   HDL 57 02/12/2014 0801   CHOLHDL 2.2 09/28/2018 0046   VLDL 7 09/28/2018 0046   LDLCALC 52 09/28/2018 0046   LDLCALC 59 06/11/2018 0819   LDLCALC 61 11/09/2015 0854   LDLCALC 43 02/12/2014 0801     Risk Assessment/Calculations:    CHA2DS2-VASc Score = 5   This indicates a 7.2% annual risk of stroke. The patient's score is based upon: CHF History: 0 HTN History: 1 Diabetes History: 1 Stroke History: 0 Vascular Disease History: 1 Age Score: 2 Gender Score: 0               Physical Exam:  VS:  BP 120/60 (BP Location: Right Arm, Patient Position: Sitting, Cuff Size: Normal)   Pulse (!) 55   Ht 5' 9 (1.753 m)   Wt 208 lb 3.2 oz (94.4 kg)   SpO2 97%   BMI 30.75 kg/m     Wt Readings from Last 3 Encounters:  09/04/24 208 lb 3.2 oz (94.4 kg)  07/03/24 206 lb (93.4 kg)  06/20/24 204 lb 5.9 oz (92.7 kg)     GEN:  Well nourished, well developed in no acute distress HEENT: Normal NECK: No JVD; No carotid bruits LYMPHATICS: No lymphadenopathy CARDIAC: RRR, 1/6 systolic murmur at the right upper sternal border RESPIRATORY:  Clear to auscultation without rales, wheezing or rhonchi  ABDOMEN: Soft, non-tender, non-distended MUSCULOSKELETAL:  No edema; No deformity  SKIN: Warm and dry NEUROLOGIC:  Alert and oriented x 3 PSYCHIATRIC:  Normal affect   Assessment & Plan Persistent atrial fibrillation (HCC) Followed closely by EP. On apixaban . Scheduled for Watchman implantation in January. If his morning symptoms don't improve with CPAP, we will reduce his diltiazem  to 120 mg daily.  Coronary artery disease involving native coronary artery  of native heart with angina pectoris Patient on multidrug antianginal therapy with diltiazem , isosorbide , and ranolazine .  Anticoagulated with apixaban  no longer taking clopidogrel .  Continue current management. Primary hypertension Well treated with spironolactone  losartan  and diltiazem  Mixed hyperlipidemia Treated with rosuvastatin  40 mg daily and Vascepa  2 g twice daily.  Cholesterol is 128, HDL 39, LDL 60. Nonrheumatic aortic (valve) stenosis Recent echo reviewed with LVEF 60 to 65% and mild to moderate aortic stenosis.  The patient's exam and Doppler data suggests only mild aortic stenosis with a mean gradient of 9.6 mmHg and calculated valve area of 1.9 cm.  Anticipate echo surveillance in 1 year.            Medication Adjustments/Labs and Tests Ordered: Current medicines are reviewed at length with the patient today.  Concerns regarding medicines are outlined above.  No orders of the defined types were placed in this encounter.  Meds ordered this encounter  Medications   apixaban  (ELIQUIS ) 5 MG TABS tablet    Sig: Take 1 tablet (5 mg total) by mouth 2 (two) times daily.    Dispense:  180 tablet    Refill:  3   isosorbide  mononitrate (IMDUR ) 60 MG 24 hr tablet    Sig: TAKE 1 TABLET(60 MG) BY MOUTH DAILY    Dispense:  90 tablet    Refill:  3   ranolazine  (RANEXA ) 1000 MG SR tablet    Sig: Take 1 tablet (1,000 mg total) by mouth 2 (two) times daily.    Dispense:  180 tablet    Refill:  3   rosuvastatin  (CRESTOR ) 40 MG tablet    Sig: Take 1 tablet (40 mg total) by mouth daily.    Dispense:  90 tablet    Refill:  3    Pt must keep upcoming followup appt with a New Cardiologist in December 2025 for any more refills. Thank You   spironolactone  (ALDACTONE ) 25 MG tablet    Sig: TAKE 1/2 TABLET(12.5 MG) BY MOUTH DAILY    Dispense:  45 tablet    Refill:  3    Patient Instructions  Medication Instructions:  Your physician recommends that you continue on your current  medications as directed. Please refer to the Current Medication list given to you today.  *If you need a refill on your cardiac medications before your next appointment, please  call your pharmacy*  Lab Work: NONE If you have labs (blood work) drawn today and your tests are completely normal, you will receive your results only by: MyChart Message (if you have MyChart) OR A paper copy in the mail If you have any lab test that is abnormal or we need to change your treatment, we will call you to review the results.  Testing/Procedures: NONE  Follow-Up: At [facility], you and your health needs are our priority.  As part of our continuing mission to provide you with exceptional heart care, our providers are all part of one team.  This team includes your primary Cardiologist (physician) and Advanced Practice Providers or APPs (Physician Assistants and Nurse Practitioners) who all work together to provide you with the care you need, when you need it.  Your next appointment:   6 month(s)  Provider:   [place], MD  We recommend signing up for the patient portal called MyChart.  Sign up information is provided on this After Visit Summary.  MyChart is used to connect with patients for Virtual Visits (Telemedicine).  Patients are able to view lab/test results, encounter notes, upcoming appointments, etc.  Non-urgent messages can be sent to your provider as well.   To learn more about what you can do with MyChart, go to [email or web address].       Signed, Ozell Wonda, MD  09/04/2024 1:29 PM     HeartCare     [1]  Current Meds  Medication Sig   Cholecalciferol (VITAMIN D3) 50 MCG (2000 UT) capsule Take 2,000 Units by mouth daily.   diltiazem  (CARDIZEM  CD) 180 MG 24 hr capsule Take 1 capsule (180 mg total) by mouth daily.   Ferrous Sulfate (IRON PO) Take 1 capsule by mouth 3 (three) times a week.   IRON-VITAMIN C PO Take 1 tablet by mouth every Monday, Wednesday,  and Friday.   JARDIANCE  10 MG TABS tablet Take 10 mg by mouth daily.   losartan  (COZAAR ) 25 MG tablet Take 1 tablet (25 mg total) by mouth daily.   MAGNESIUM PO Take 120 mg by mouth 2 (two) times daily.   Multiple Vitamin (MULTIVITAMIN WITH MINERALS) TABS tablet Take 1 tablet by mouth daily.   nitroGLYCERIN  (NITROLINGUAL ) 0.4 MG/SPRAY spray Place 1 spray under the tongue every 5 (five) minutes x 3 doses as needed for chest pain.   NUCYNTA  ER 200 MG TB12 Take 200 mg by mouth 2 (two) times daily.   OVER THE COUNTER MEDICATION Take 1 tablet by mouth daily. NUCLEZYME-FORTE supplement   OVER THE COUNTER MEDICATION Take 1 capsule by mouth 2 (two) times daily. COCOAVIA supplement   pioglitazone  (ACTOS ) 45 MG tablet Take 45 mg by mouth daily before supper.   pregabalin  (LYRICA ) 200 MG capsule Take 200 mg by mouth 3 (three) times daily.    Probiotic Product (PROBIOTIC PO) Take 1 capsule by mouth daily.   Quercetin 500 MG CAPS Take 500 mg by mouth daily.   TURMERIC PO Take 5 mLs by mouth daily. Liquid supplement   Vitamin E 268 MG (400 UNIT) CAPS Take 268 mg by mouth daily.   [DISCONTINUED] apixaban  (ELIQUIS ) 5 MG TABS tablet Take 1 tablet (5 mg total) by mouth 2 (two) times daily.   [DISCONTINUED] icosapent  Ethyl (VASCEPA ) 1 g capsule Take 2 capsules (2 g total) by mouth 2 (two) times daily.   [DISCONTINUED] isosorbide  mononitrate (IMDUR ) 60 MG 24 hr tablet TAKE 1 TABLET(60 MG) BY MOUTH DAILY   [DISCONTINUED] ranolazine  (  RANEXA ) 1000 MG SR tablet Take 1 tablet (1,000 mg total) by mouth 2 (two) times daily.   [DISCONTINUED] rosuvastatin  (CRESTOR ) 40 MG tablet Take 1 tablet (40 mg total) by mouth daily.   [DISCONTINUED] spironolactone  (ALDACTONE ) 25 MG tablet TAKE 1/2 TABLET(12.5 MG) BY MOUTH DAILY   [DISCONTINUED] Vitamins-Lipotropics (LIPOFLAVONOID PO) Take 100 mg by mouth 3 (three) times daily.

## 2024-09-04 NOTE — Assessment & Plan Note (Signed)
 Treated with rosuvastatin  40 mg daily and Vascepa  2 g twice daily.  Cholesterol is 128, HDL 39, LDL 60.

## 2024-09-04 NOTE — Assessment & Plan Note (Signed)
 Patient on multidrug antianginal therapy with diltiazem , isosorbide , and ranolazine .  Anticoagulated with apixaban  no longer taking clopidogrel .  Continue current management.

## 2024-09-04 NOTE — Patient Instructions (Signed)
 Medication Instructions:  Your physician recommends that you continue on your current medications as directed. Please refer to the Current Medication list given to you today.  *If you need a refill on your cardiac medications before your next appointment, please call your pharmacy*  Lab Work: NONE If you have labs (blood work) drawn today and your tests are completely normal, you will receive your results only by: MyChart Message (if you have MyChart) OR A paper copy in the mail If you have any lab test that is abnormal or we need to change your treatment, we will call you to review the results.  Testing/Procedures: NONE  Follow-Up: At [facility], you and your health needs are our priority.  As part of our continuing mission to provide you with exceptional heart care, our providers are all part of one team.  This team includes your primary Cardiologist (physician) and Advanced Practice Providers or APPs (Physician Assistants and Nurse Practitioners) who all work together to provide you with the care you need, when you need it.  Your next appointment:   6 month(s)  Provider:   Wonda, MD  We recommend signing up for the patient portal called MyChart.  Sign up information is provided on this After Visit Summary.  MyChart is used to connect with patients for Virtual Visits (Telemedicine).  Patients are able to view lab/test results, encounter notes, upcoming appointments, etc.  Non-urgent messages can be sent to your provider as well.   To learn more about what you can do with MyChart, go to [email or web address].

## 2024-09-14 NOTE — Addendum Note (Signed)
 Addended by: WAYLON EDSEL HERO on: 09/14/2024 10:33 AM   Modules accepted: Orders

## 2024-09-14 NOTE — Telephone Encounter (Signed)
 Due to change in LAAO schedule, patient has been moved up to 10/01/2024. Patient recently saw Dr. Wonda and was having no symptoms of chest pain/SOB. New instructions will be sent via MyChart. Lab work will be ordered.

## 2024-09-14 NOTE — Addendum Note (Signed)
 Addended by: WAYLON EDSEL HERO on: 09/14/2024 10:19 AM   Modules accepted: Orders

## 2024-09-21 LAB — BASIC METABOLIC PANEL WITH GFR
BUN/Creatinine Ratio: 21 (ref 10–24)
BUN: 23 mg/dL (ref 8–27)
CO2: 24 mmol/L (ref 20–29)
Calcium: 8.9 mg/dL (ref 8.6–10.2)
Chloride: 100 mmol/L (ref 96–106)
Creatinine, Ser: 1.1 mg/dL (ref 0.76–1.27)
Glucose: 117 mg/dL — ABNORMAL HIGH (ref 70–99)
Potassium: 4 mmol/L (ref 3.5–5.2)
Sodium: 139 mmol/L (ref 134–144)
eGFR: 69 mL/min/1.73

## 2024-09-21 LAB — CBC WITH DIFFERENTIAL/PLATELET
Basophils Absolute: 0 x10E3/uL (ref 0.0–0.2)
Basos: 0 %
EOS (ABSOLUTE): 0.1 x10E3/uL (ref 0.0–0.4)
Eos: 1 %
Hematocrit: 43.1 % (ref 37.5–51.0)
Hemoglobin: 14.1 g/dL (ref 13.0–17.7)
Immature Grans (Abs): 0 x10E3/uL (ref 0.0–0.1)
Immature Granulocytes: 0 %
Lymphocytes Absolute: 0.9 x10E3/uL (ref 0.7–3.1)
Lymphs: 18 %
MCH: 30.3 pg (ref 26.6–33.0)
MCHC: 32.7 g/dL (ref 31.5–35.7)
MCV: 93 fL (ref 79–97)
Monocytes Absolute: 0.5 x10E3/uL (ref 0.1–0.9)
Monocytes: 11 %
Neutrophils Absolute: 3.5 x10E3/uL (ref 1.4–7.0)
Neutrophils: 70 %
Platelets: 250 x10E3/uL (ref 150–450)
RBC: 4.65 x10E6/uL (ref 4.14–5.80)
RDW: 14.4 % (ref 11.6–15.4)
WBC: 5.1 x10E3/uL (ref 3.4–10.8)

## 2024-09-25 ENCOUNTER — Ambulatory Visit: Admitting: Pulmonary Disease

## 2024-09-25 ENCOUNTER — Telehealth: Payer: Self-pay

## 2024-09-25 NOTE — Telephone Encounter (Signed)
 Confirmed LAAO procedure date of 10/01/24. Confirmed arrival time of 0530 for procedure time at 0730. Reviewed pre-procedure instructions with patient. Contrast allergy? No PPM or defibrillator? No The patient understands to call if questions/concerns arise prior to procedure. The patient was grateful for call and agreed with plan.

## 2024-10-01 ENCOUNTER — Inpatient Hospital Stay (HOSPITAL_COMMUNITY): Payer: Self-pay | Admitting: Certified Registered Nurse Anesthetist

## 2024-10-01 ENCOUNTER — Encounter (HOSPITAL_COMMUNITY): Payer: Self-pay | Admitting: Cardiology

## 2024-10-01 ENCOUNTER — Inpatient Hospital Stay (HOSPITAL_COMMUNITY)
Admission: RE | Admit: 2024-10-01 | Discharge: 2024-10-01 | DRG: 274 | Disposition: A | Discharge status: Home / Self Care | Attending: Cardiology | Admitting: Cardiology

## 2024-10-01 ENCOUNTER — Inpatient Hospital Stay (HOSPITAL_COMMUNITY)
Admission: RE | Disposition: A | Discharge status: Home / Self Care | Payer: Self-pay | Source: Home / Self Care | Attending: Cardiology

## 2024-10-01 ENCOUNTER — Other Ambulatory Visit: Payer: Self-pay

## 2024-10-01 ENCOUNTER — Inpatient Hospital Stay (HOSPITAL_COMMUNITY)

## 2024-10-01 DIAGNOSIS — Z006 Encounter for examination for normal comparison and control in clinical research program: Secondary | ICD-10-CM

## 2024-10-01 DIAGNOSIS — I25110 Atherosclerotic heart disease of native coronary artery with unstable angina pectoris: Secondary | ICD-10-CM

## 2024-10-01 DIAGNOSIS — Z7901 Long term (current) use of anticoagulants: Secondary | ICD-10-CM

## 2024-10-01 DIAGNOSIS — D6869 Other thrombophilia: Secondary | ICD-10-CM | POA: Diagnosis present

## 2024-10-01 DIAGNOSIS — I480 Paroxysmal atrial fibrillation: Secondary | ICD-10-CM | POA: Diagnosis not present

## 2024-10-01 DIAGNOSIS — Z951 Presence of aortocoronary bypass graft: Secondary | ICD-10-CM

## 2024-10-01 DIAGNOSIS — Z79899 Other long term (current) drug therapy: Secondary | ICD-10-CM

## 2024-10-01 DIAGNOSIS — Z9181 History of falling: Secondary | ICD-10-CM

## 2024-10-01 DIAGNOSIS — I4819 Other persistent atrial fibrillation: Principal | ICD-10-CM | POA: Diagnosis present

## 2024-10-01 DIAGNOSIS — I4892 Unspecified atrial flutter: Secondary | ICD-10-CM | POA: Diagnosis present

## 2024-10-01 DIAGNOSIS — I10 Essential (primary) hypertension: Secondary | ICD-10-CM

## 2024-10-01 DIAGNOSIS — I2510 Atherosclerotic heart disease of native coronary artery without angina pectoris: Secondary | ICD-10-CM | POA: Diagnosis present

## 2024-10-01 DIAGNOSIS — E1140 Type 2 diabetes mellitus with diabetic neuropathy, unspecified: Secondary | ICD-10-CM | POA: Diagnosis present

## 2024-10-01 DIAGNOSIS — I110 Hypertensive heart disease with heart failure: Secondary | ICD-10-CM | POA: Diagnosis present

## 2024-10-01 DIAGNOSIS — I4891 Unspecified atrial fibrillation: Principal | ICD-10-CM | POA: Diagnosis present

## 2024-10-01 DIAGNOSIS — R296 Repeated falls: Secondary | ICD-10-CM | POA: Diagnosis present

## 2024-10-01 HISTORY — PX: LEFT ATRIAL APPENDAGE OCCLUSION: EP1229

## 2024-10-01 HISTORY — PX: TRANSESOPHAGEAL ECHOCARDIOGRAM (CATH LAB): EP1270

## 2024-10-01 LAB — GLUCOSE, CAPILLARY
Glucose-Capillary: 123 mg/dL — ABNORMAL HIGH (ref 70–99)
Glucose-Capillary: 214 mg/dL — ABNORMAL HIGH (ref 70–99)

## 2024-10-01 LAB — SURGICAL PCR SCREEN
MRSA, PCR: NEGATIVE
Staphylococcus aureus: NEGATIVE

## 2024-10-01 LAB — TYPE AND SCREEN
ABO/RH(D): O POS
Antibody Screen: NEGATIVE

## 2024-10-01 LAB — ECHO TEE: P 1/2 time: 666 ms

## 2024-10-01 LAB — POCT ACTIVATED CLOTTING TIME: Activated Clotting Time: 291 s

## 2024-10-01 LAB — ABO/RH: ABO/RH(D): O POS

## 2024-10-01 MED ORDER — ROCURONIUM BROMIDE 10 MG/ML (PF) SYRINGE
PREFILLED_SYRINGE | INTRAVENOUS | Status: DC | PRN
  Administered 2024-10-01: 60 mg via INTRAVENOUS

## 2024-10-01 MED ORDER — CHLORHEXIDINE GLUCONATE 0.12 % MT SOLN
OROMUCOSAL | Status: AC
  Administered 2024-10-01: 15 mL via OROMUCOSAL
  Filled 2024-10-01: qty 15

## 2024-10-01 MED ORDER — PROTAMINE SULFATE 10 MG/ML IV SOLN
INTRAVENOUS | Status: DC | PRN
  Administered 2024-10-01: 35 mg via INTRAVENOUS

## 2024-10-01 MED ORDER — FENTANYL CITRATE (PF) 250 MCG/5ML IJ SOLN
INTRAMUSCULAR | Status: DC | PRN
  Administered 2024-10-01: 100 ug via INTRAVENOUS

## 2024-10-01 MED ORDER — SODIUM CHLORIDE 0.9 % IV SOLN: 250.0000 mL | INTRAVENOUS | Status: DC | PRN

## 2024-10-01 MED ORDER — INSULIN ASPART 100 UNIT/ML IJ SOLN: 0.0000 [IU] | INTRAMUSCULAR | Status: DC | PRN

## 2024-10-01 MED ORDER — CEFAZOLIN SODIUM-DEXTROSE 2-4 GM/100ML-% IV SOLN
2.0000 g | INTRAVENOUS | Status: AC
  Administered 2024-10-01: 2 g via INTRAVENOUS
  Filled 2024-10-01: qty 100

## 2024-10-01 MED ORDER — PHENYLEPHRINE HCL-NACL 20-0.9 MG/250ML-% IV SOLN
INTRAVENOUS | Status: DC | PRN
  Administered 2024-10-01: 10 ug/min via INTRAVENOUS

## 2024-10-01 MED ORDER — MIDAZOLAM HCL 2 MG/2ML IJ SOLN
INTRAMUSCULAR | Status: AC
  Filled 2024-10-01: qty 2

## 2024-10-01 MED ORDER — ONDANSETRON HCL 4 MG/2ML IJ SOLN
INTRAMUSCULAR | Status: DC | PRN
  Administered 2024-10-01: 4 mg via INTRAVENOUS

## 2024-10-01 MED ORDER — ACETAMINOPHEN 325 MG PO TABS: 650.0000 mg | ORAL_TABLET | ORAL | Status: DC | PRN

## 2024-10-01 MED ORDER — LACTATED RINGERS IV SOLN: INTRAVENOUS | Status: DC

## 2024-10-01 MED ORDER — SODIUM CHLORIDE 0.9% FLUSH
3.0000 mL | Freq: Two times a day (BID) | INTRAVENOUS | Status: DC
  Administered 2024-10-01: 3 mL via INTRAVENOUS

## 2024-10-01 MED ORDER — ONDANSETRON HCL 4 MG/2ML IJ SOLN: 4.0000 mg | Freq: Four times a day (QID) | INTRAMUSCULAR | Status: DC | PRN

## 2024-10-01 MED ORDER — SODIUM CHLORIDE 0.9% FLUSH: 3.0000 mL | INTRAVENOUS | Status: DC | PRN

## 2024-10-01 MED ORDER — HEPARIN SODIUM (PORCINE) 1000 UNIT/ML IJ SOLN
INTRAMUSCULAR | Status: DC | PRN
  Administered 2024-10-01: 15000 [IU] via INTRAVENOUS
  Administered 2024-10-01: 1000 [IU] via INTRAVENOUS

## 2024-10-01 MED ORDER — CHLORHEXIDINE GLUCONATE 0.12 % MT SOLN: 15.0000 mL | Freq: Once | OROMUCOSAL | Status: AC

## 2024-10-01 MED ORDER — LIDOCAINE 2% (20 MG/ML) 5 ML SYRINGE
INTRAMUSCULAR | Status: DC | PRN
  Administered 2024-10-01: 75 mg via INTRAVENOUS

## 2024-10-01 MED ORDER — DEXAMETHASONE SOD PHOSPHATE PF 10 MG/ML IJ SOLN
INTRAMUSCULAR | Status: DC | PRN
  Administered 2024-10-01: 10 mg via INTRAVENOUS

## 2024-10-01 MED ORDER — FENTANYL CITRATE (PF) 100 MCG/2ML IJ SOLN
INTRAMUSCULAR | Status: AC
  Filled 2024-10-01: qty 2

## 2024-10-01 MED ORDER — IODIXANOL 320 MG/ML IV SOLN
INTRAVENOUS | Status: DC | PRN
  Administered 2024-10-01 (×3): 10 mL

## 2024-10-01 MED ORDER — APIXABAN 5 MG PO TABS: 5.0000 mg | ORAL_TABLET | Freq: Once | ORAL | Status: AC

## 2024-10-01 MED ORDER — SUGAMMADEX SODIUM 200 MG/2ML IV SOLN
INTRAVENOUS | Status: DC | PRN
  Administered 2024-10-01 (×2): 100 mg via INTRAVENOUS

## 2024-10-01 MED ORDER — PROPOFOL 10 MG/ML IV BOLUS
INTRAVENOUS | Status: DC | PRN
  Administered 2024-10-01: 50 mg via INTRAVENOUS
  Administered 2024-10-01: 150 mg via INTRAVENOUS

## 2024-10-01 MED ORDER — HEPARIN (PORCINE) IN NACL 2000-0.9 UNIT/L-% IV SOLN
INTRAVENOUS | Status: DC | PRN
  Administered 2024-10-01: 1000 mL

## 2024-10-01 MED ORDER — SODIUM CHLORIDE 0.9 % IV SOLN: INTRAVENOUS | Status: DC

## 2024-10-01 MED ORDER — APIXABAN 5 MG PO TABS
ORAL_TABLET | ORAL | Status: AC
  Administered 2024-10-01: 5 mg via ORAL
  Filled 2024-10-01: qty 1

## 2024-10-01 NOTE — H&P (Addendum)
 "  Electrophysiology Note:   Date:  06/16/2024  ID:  Ruben [name], DOB 09/02/1946, MRN [id number]   Primary Cardiologist: None Electrophysiologist: Eulas FORBES Furbish, MD       History of Present Illness:   Ruben [name] is a 79 y.o. male with h/o typical AF / AFL s/p ablation, CAD s/p CABG & PCI who is being seen today for evaluation for Watchman device implant at the request of Daphne Barrack, NP.     Discussed the use of AI scribe software for clinical note transcription with the patient, who gave verbal consent to proceed.   History of Present Illness Ruben [name] is a 79 year old male who presents for follow-up after an atrial fibrillation ablation.  He had CTI ablation by Dr. Kelsie in 2018. PVI and posterior wall ablation on 03/26/2024 with Dr. Furbish.    Following the most recent ablation, he experienced episodes of atrial fibrillation and atypical atrial flutter, with the most recent episode occurring in late August 2025. He reports that clinicians described something as 'P waves' during these episodes.   He has a history of multiple falls, one of which resulted in a major bleeding event. He also mentions several other falls.  He has bad peripheral neuropathy which he has attributed to his instability and falls.  Since the ablation procedure, he has not experienced any major bleeds.   He is currently on a blood thinner and experiences 'a lot of little cuts and bruises' but no major bleeding events of late.  No new or acute complaints otherwise.   Interval: Patient presents today for Watchman implant. Reports doing relatively well. No new or acute complaints.   Review of systems complete and found to be negative unless listed in HPI.    EP Information / Studies Reviewed:     EKG is not ordered today. EKG from 06/04/24 reviewed which showed sinus rhythm with first-degree AV delay, PR 264 ms, and PVC.      ECG 05/19/24: AFL    Echo 05/23/23:   1. Left ventricular  ejection fraction, by estimation, is 60 to 65%. The  left ventricle has normal function. The left ventricle has no regional  wall motion abnormalities. Left ventricular diastolic parameters were  normal. The average left ventricular  global longitudinal strain is -27.1 %. The global longitudinal strain is  normal.   2. Right ventricular systolic function is normal. The right ventricular  size is normal.   3. Left atrial size was mildly dilated.   4. The mitral valve is normal in structure. Trivial mitral valve  regurgitation. No evidence of mitral stenosis.   5. The aortic valve is tricuspid. Aortic valve regurgitation is mild.  Aortic valve sclerosis is present, with no evidence of aortic valve  stenosis.   6. Aortic dilatation noted. There is mild dilatation of the ascending  aorta, measuring 39 mm.   7. The inferior vena cava is normal in size with greater than 50%  respiratory variability, suggesting right atrial pressure of 3 mmHg.    Risk Assessment/Calculations:     CHA2DS2-VASc Score = 5   This indicates a 7.2% annual risk of stroke. The patient's score is based upon: CHF History: 0 HTN History: 1 Diabetes History: 1 Stroke History: 0 Vascular Disease History: 1 Age Score: 2 Gender Score: 0             Physical Exam:    Today's Vitals   10/01/24 0617 10/01/24 0645  BP: 123/73   Pulse: 60   Resp: 20   Temp: 98.4 F (36.9 C)   TempSrc: Oral   SpO2: 96%   Weight: 92.5 kg   Height: 5' 9 (1.753 m)   PainSc:  0-No pain   Body mass index is 30.13 kg/m.   GEN: Well nourished, well developed in no acute distress NECK: No JVD CARDIAC: Normal rate, regular rhythm.  3 out of 6 systolic ejection murmur. RESPIRATORY:  Clear to auscultation without rales, wheezing or rhonchi  ABDOMEN: Soft, non-distended EXTREMITIES:  No edema; No deformity    ASSESSMENT AND PLAN:   I have seen Ruben [name] in the office today who is being considered for a Watchman left  atrial appendage closure device. I believe they will benefit from this procedure given their history of atrial fibrillation, CHA2DS2-VASc score of 5 and unadjusted ischemic stroke rate of 7.2% per year. Unfortunately, the patient is not felt to be a long term anticoagulation candidate secondary to recurrent falls (at least 5) and bruising.  He has bad peripheral neuropathy which he has attributed to his instability and falls.  The patient's chart has been reviewed and I feel that they would be a candidate for short term oral anticoagulation after Watchman implant.    It is my belief that after undergoing a LAA closure procedure, Ruben [name] will not need long term anticoagulation which eliminates anticoagulation side effects and major bleeding risk.    Procedural risks for the Watchman implant have been reviewed with the patient including a 0.5% risk of stroke, <1% risk of perforation and <1% risk of device embolization. Other risks include bleeding, vascular damage, tamponade, worsening renal function, and death. The patient understands these risk and wishes to proceed.       The published clinical data on the safety and effectiveness of WATCHMAN include but are not limited to the following: - Holmes DR, Jess BEARD, Sick P et al. for the PROTECT AF Investigators. Percutaneous closure of the left atrial appendage versus warfarin therapy for prevention of stroke in patients with atrial fibrillation: a randomised non-inferiority trial. Lancet 2009; 374: 534-42. GLENWOOD Jess BEARD, Doshi SK, Jonita VEAR Satchel D et al. on behalf of the PROTECT AF Investigators. Percutaneous Left Atrial Appendage Closure for Stroke Prophylaxis in Patients With Atrial Fibrillation 2.3-Year Follow-up of the PROTECT AF (Watchman Left Atrial Appendage System for Embolic Protection in Patients With Atrial Fibrillation) Trial. Circulation 2013; 127:720-729. - Alli O, Doshi S,  Kar S, Reddy VY, Sievert H et al. Quality of Life Assessment in  the Randomized PROTECT AF (Percutaneous Closure of the Left Atrial Appendage Versus Warfarin Therapy for Prevention of Stroke in Patients With Atrial Fibrillation) Trial of Patients at Risk for Stroke With Nonvalvular Atrial Fibrillation. J Am Coll Cardiol 2013; 61:1790-8. GLENWOOD Satchel DR, Archer RAMAN, Price M, Whisenant B, Sievert H, Doshi S, Huber K, Reddy V. Prospective randomized evaluation of the Watchman left atrial appendage Device in patients with atrial fibrillation versus long-term warfarin therapy; the PREVAIL trial. Journal of the [employer] of Cardiology, Vol. 4, No. 1, 2014, 1-11. - Kar S, Doshi SK, Sadhu A, Horton R, Osorio J et al. Primary outcome evaluation of a next-generation left atrial appendage closure device: results from the PINNACLE FLX trial. Circulation 2021;143(18)1754-1762.      HAS-BLED score 3 Hypertension Yes  Abnormal renal and liver function (Dialysis, transplant, Cr >2.26 mg/dL /Cirrhosis or Bilirubin >2x Normal or AST/ALT/AP >3x Normal) No  Stroke No  Bleeding No  Labile INR (Unstable/high INR) No  Elderly (>65) Yes  Drugs or alcohol (>= 8 drinks/week, anti-plt or NSAID) Yes    CHA2DS2-VASc Score = 5  The patient's score is based upon: CHF History: 0 HTN History: 1 Diabetes History: 1 Stroke History: 0 Vascular Disease History: 1 Age Score: 2 Gender Score: 0         ASSESSMENT AND PLAN: After today's visit with the patient which was dedicated solely for shared decision making visit regarding LAA closure device, the patient decided to proceed with the LAA appendage closure procedure scheduled to be done in the near future at [facility]. Prior to the procedure, I would like to obtain a gated CT scan of the chest with contrast timed for PV/LA visualization.    Persistent Atrial Fibrillation, Typical AFL and Atypical AFL: S/p multiple catheter ablations. The patient's CHA2DS2-VASc score is 5, indicating a 7.2% annual risk of stroke.   Continue  diltiazem  120 mg daily.   Secondary Hypercoagulable State (ICD10:  D68.69) The patient is at significant risk for stroke/thromboembolism based upon his CHA2DS2-VASc Score of 5.  Continue Apixaban  (Eliquis ) for 45 days.   Follow up with Dr. Kennyth 3 months after [facility] implant.    Signed, Fonda Kennyth, MD      "

## 2024-10-01 NOTE — Discharge Instructions (Signed)
 " WATCHMAN Procedure, Care After  Procedure MD: Dr. Kennyth Olds Clinical Coordinator: Edsel Greek, RN and Rockie Redman, RN  This sheet gives you information about how to care for yourself after your procedure. Your health care provider may also give you more specific instructions. If you have problems or questions, contact your health care provider.  What can I expect after the procedure? After the procedure, it is common to have: Bruising around your puncture site. Tenderness around your puncture site. Tiredness (fatigue).  Medication instructions It is very important to continue to take your blood thinner as directed by your doctor after the Watchman procedure. Call your procedure doctors office with question or concerns. If you are on Coumadin (warfarin), you will have your INR checked the week after your procedure, with a goal INR of 2.0 - 3.0. Please follow your medication instructions on your discharge summary. Only take the medications listed on your discharge paperwork.  Follow up You will be seen in 6 weeks after your procedure You will have a repeat CT scan or Echocardiogram approximately 8 weeks after your procedure mark to check your device You will follow up the MD/APP who performed your procedure 6 months after your procedure The Watchman Clinical Coordinator will check in with you from time to time, including 1 and 2 years after your procedure.  NO DENTAL CLEANINGS FOR 45 days. After that, you will require antibiotics for dental procedures the first 6 months.   Follow these instructions at home: Puncture site care  Follow instructions from your health care provider about how to take care of your puncture site. Make sure you: If present, leave stitches (sutures), skin glue, or adhesive strips in place.  If a large square bandage is present, this may be removed 24 hours after surgery.  Check your puncture site every day for signs of infection. Check for: Redness,  swelling, or pain. Fluid or blood. If your puncture site starts to bleed, lie down on your back, apply firm pressure to the area, and contact your health care provider. Warmth. Pus or a bad smell. Driving Do not drive yourself home if you received sedation Do not drive for at least 4 days after your procedure or however long your health care provider recommends. (Do not resume driving if you have previously been instructed not to drive for other health reasons.) Do not spend greater than 1 hour at a time in a car for the first 3 days. Stop and take a break with a 5 minute walk at least every hour.  Do not drive or use heavy machinery while taking prescription pain medicine.  Activity Avoid activities that take a lot of effort, including exercise, for at least 7 days after your procedure. For the first 3 days, avoid sitting for longer than one hour at a time.  Avoid alcoholic beverages, signing paperwork, or participating in legal proceedings for 24 hours after receiving sedation Do not lift anything that is heavier than 10 lb (4.5 kg) for one week.  No sexual activity for 1 week.  Return to your normal activities as told by your health care provider. Ask your health care provider what activities are safe for you. General instructions Take over-the-counter and prescription medicines only as told by your health care provider. Do not use any products that contain nicotine or tobacco, such as cigarettes and e-cigarettes. If you need help quitting, ask your health care provider. You may shower after 24 hours, but Do not take baths, swim,  or use a hot tub for 1 week.  Do not drink alcohol for 24 hours after your procedure. Keep all follow-up visits as told by your health care provider. This is important. Dental Work: You will require antibiotics prior to any dental work, including cleanings, for 6 months after your Watchman implantation to help protect you from infection. After 6 months, antibiotics  are no longer required. Contact a health care provider if: You have redness, mild swelling, or pain around your puncture site. You have soreness in your throat or at your puncture site that does not improve after several days You have fluid or blood coming from your puncture site that stops after applying firm pressure to the area. Your puncture site feels warm to the touch. You have pus or a bad smell coming from your puncture site. You have a fever. You have chest pain or discomfort that spreads to your neck, jaw, or arm. You are sweating a lot. You feel nauseous. You have a fast or irregular heartbeat. You have shortness of breath. You are dizzy or light-headed and feel the need to lie down. You have pain or numbness in the arm or leg closest to your puncture site. Get help right away if: Your puncture site suddenly swells. Your puncture site is bleeding and the bleeding does not stop after applying firm pressure to the area. These symptoms may represent a serious problem that is an emergency. Do not wait to see if the symptoms will go away. Get medical help right away. Call your local emergency services (911 in the [place] Do not drive yourself to the hospital. Summary After the procedure, it is normal to have bruising and tenderness at the puncture site in your groin, neck, or forearm. Check your puncture site every day for signs of infection. Get help right away if your puncture site is bleeding and the bleeding does not stop after applying firm pressure to the area. This is a medical emergency.  This information is not intended to replace advice given to you by your health care provider. Make sure you discuss any questions you have with your health care provider.   "

## 2024-10-01 NOTE — Discharge Summary (Addendum)
 "   Electrophysiology Discharge Summary   Patient ID: Ruben [name],  MRN: [id number], DOB/AGE: 01-23-1946 79 y.o.  Admit date: 10/01/2024 Discharge date: 10/01/2024  Primary Care Physician: Regino Slater, MD  Primary Cardiologist: None  Electrophysiologist: Eulas FORBES Furbish, MD  Primary Discharge Diagnosis:  Persistent Atrial Fibrillation AFlutter (typical/atypical) (hx of multiple ablations) Secondary hypercoagulable state  Poor candidacy for long term anticoagulation due to peripheral neuropathy/gait instability/falls/risk  Secondary Discharge Diagnosis:  CAD CABG 1997, PCI 2009 HTN DM  Procedures This Admission:  LAAO/TEE CONCLUSIONS:  1.Successful implantation of a 31mm WATCHMAN left atrial appendage occlusive device    2. TEE demonstrating no LAA thrombus 3. No early apparent complications.   Post Implant Anticoagulation Strategy: Continue Eliquis  for 45 days then transition to DAPT with aspirin  81mg  and Plavix  75mg  daily. Repeat imaging with CT scan in 60 days.     Brief HPI: Ruben [name] is a 79 y.o. male with a history of Persistent Atrial Fibrillation as well as both typical/atypical AFlutter, hx of a number of prior ablations who is followed by EP team in the outpatient setting.  Du to his hx of due to peripheral neuropathy/gait instability/falls/risk, referred for consideration of watchman.   Hospital Course:  The patient was admitted and underwent left atrial appendage occlusive device placement as above.  The patient was monitored in the post procedure setting and has done very well with no concerns. Given this, he/she is being considered for same day discharge later today. Groin site has been stable without evidence of hematoma or bleeding. Wound care and restrictions were reviewed with the patient.   The patient has been scheduled for post procedure follow up with EP APP in approximately 6 weeks. They will restart Eliquis  this evening and continue for 45  days then stop. At that time he will transition to Plavix  75mg  daily to complete 6 months of therapy. They will require dental SBE for 6 month post op and should refrain from dental work or cleanings for the first 45 days post implant. SBE to be RXd at follow up.   A repeat CT scan will be performed in approximately 60 days to ensure proper seal of the device.    Physical Exam: Vitals:   10/01/24 1200 10/01/24 1247 10/01/24 1307 10/01/24 1322  BP: 118/65 113/61 114/63 117/64  Pulse: (!) 57 60    Resp: (!) 8 18 19 18   Temp:  97.9 F (36.6 C)    TempSrc:  Oral    SpO2: 97% 97% 96% 94%  Weight:      Height:        GEN: Well nourished, well developed in no acute distress NECK: No JVD; No carotid bruits CARDIAC: Regular rate and rhythm, no murmurs, rubs, gallops RESPIRATORY:  Clear to auscultation without rales, wheezing or rhonchi  ABDOMEN: Soft, non-tender, non-distended EXTREMITIES:  No edema; No deformity. Groin site Stable     Discharge Medications:  Allergies as of 10/01/2024       Reactions   Nortriptyline Other (See Comments)   Groggy at low dose   Cymbalta [duloxetine Hcl] Diarrhea   Abdominal Pain, GI Intolerance   Niacin  Other (See Comments)   Flushing         Medication List     TAKE these medications    apixaban  5 MG Tabs tablet Commonly known as: Eliquis  Take 1 tablet (5 mg total) by mouth 2 (two) times daily. Notes to patient: Take tonight's dose ~ 10:00PM, resume your  usual dosing schedule tomorrow 10/02/24   diltiazem  180 MG 24 hr capsule Commonly known as: CARDIZEM  CD Take 1 capsule (180 mg total) by mouth daily.   IRON-VITAMIN C PO Take 1 tablet by mouth every Monday, Wednesday, and Friday.   isosorbide  mononitrate 60 MG 24 hr tablet Commonly known as: IMDUR  TAKE 1 TABLET(60 MG) BY MOUTH DAILY   Jardiance  10 MG Tabs tablet Generic drug: empagliflozin  Take 10 mg by mouth daily.   LIPO FLAVONOID PLUS PO Take 1 tablet by mouth 3 (three) times  daily.   losartan  25 MG tablet Commonly known as: COZAAR  Take 1 tablet (25 mg total) by mouth daily.   MAGNESIUM PO Take 120 mg by mouth 2 (two) times daily.   multivitamin with minerals Tabs tablet Take 1 tablet by mouth daily.   nitroGLYCERIN  0.4 MG/SPRAY spray Commonly known as: Nitrolingual  Place 1 spray under the tongue every 5 (five) minutes x 3 doses as needed for chest pain.   Nucynta  ER 200 MG Tb12 Generic drug: Tapentadol  HCl Take 200 mg by mouth 2 (two) times daily.   OVER THE COUNTER MEDICATION Take 1 tablet by mouth daily. NUCLEZYME-FORTE supplement   OVER THE COUNTER MEDICATION Take 1 capsule by mouth 2 (two) times daily. COCOAVIA supplement   pioglitazone  45 MG tablet Commonly known as: ACTOS  Take 45 mg by mouth daily before supper.   pregabalin  200 MG capsule Commonly known as: LYRICA  Take 200 mg by mouth 3 (three) times daily.   PROBIOTIC PO Take 1 capsule by mouth daily.   Quercetin 500 MG Caps Take 500 mg by mouth daily.   ranolazine  1000 MG SR tablet Commonly known as: RANEXA  Take 1 tablet (1,000 mg total) by mouth 2 (two) times daily.   rosuvastatin  40 MG tablet Commonly known as: CRESTOR  Take 1 tablet (40 mg total) by mouth daily.   spironolactone  25 MG tablet Commonly known as: ALDACTONE  TAKE 1/2 TABLET(12.5 MG) BY MOUTH DAILY   TURMERIC PO Take 5 mLs by mouth daily. Liquid supplement   Vitamin D3 50 MCG (2000 UT) capsule Take 2,000 Units by mouth daily.   Vitamin E 268 MG (400 UNIT) Caps Take 268 mg by mouth daily.        Disposition:  Home with usual follow up as in AVS    Signed, Charlies Macario Arthur, PA-C  10/01/2024 2:50 PM   I have seen, examined the patient, and reviewed the above assessment and plan.    Hospital Course: Mr.Detjen presented on 10/01/2024 for planned Watchman device implant.  He underwent successful implantation of a 31 mm Watchman Flex Pro left atrial appendage occlusion device.  He was monitored  postoperatively per usual protocol.  There were no obvious complications.  He was discharged with follow-up arranged.  General: Well developed, in no acute distress.  Neck: No JVD.  Cardiac: Normal rate, regular rhythm.  Resp: Normal work of breathing.  Ext: No edema.  Right groin soft without bleeding or hematoma. Neuro: No gross focal deficits.  Psych: Normal affect.   Assessment and Plan:  #S/p Watchman implant #Persistent atrial fibrillation  #Typical AFL #Atypical AFL -Continue Eliquis  for 45 days then transition to DAPT with aspirin  81mg  and Plavix  75mg  daily. Repeat imaging with CT scan in 60 days.   Duration of discharge encounter: 35 minutes.   Fonda Kitty, MD 10/01/2024 10:02 PM    "

## 2024-10-01 NOTE — Anesthesia Preprocedure Evaluation (Signed)
 "                                  Anesthesia Evaluation  Patient identified by MRN, date of birth, ID band Patient awake    Reviewed: Allergy & Precautions, NPO status , Patient's Chart, lab work & pertinent test results  Airway Mallampati: II  TM Distance: >3 FB Neck ROM: Full    Dental no notable dental hx.    Pulmonary neg pulmonary ROS   Pulmonary exam normal        Cardiovascular hypertension, Pt. on medications + CAD   Rhythm:Regular Rate:Normal  ECHO 08/25: IMPRESSIONS   1. Left ventricular ejection fraction, by estimation, is 60 to 65%. Left  ventricular ejection fraction by 3D volume is 67 %. The left ventricle has  normal function. The left ventricle has no regional wall motion  abnormalities. There is mild concentric  left ventricular hypertrophy. Left ventricular diastolic parameters were  normal.   2. Right ventricular systolic function is normal. The right ventricular  size is normal.   3. Left atrial size was mildly dilated.   4. The mitral valve is normal in structure. Trivial mitral valve  regurgitation. No evidence of mitral stenosis.   5. The aortic valve is tricuspid. There is mild calcification of the  aortic valve. Aortic valve regurgitation is mild. Mild to moderate aortic  valve stenosis. Aortic regurgitation PHT measures 407 msec. Aortic valve  area, by VTI measures 1.88 cm.  Aortic valve mean gradient measures 9.6 mmHg. Aortic valve Vmax measures  2.38 m/s.   6. Aortic dilatation noted. There is borderline dilatation of the  ascending aorta, measuring 40 mm.   7. The inferior vena cava is normal in size with greater than 50%  respiratory variability, suggesting right atrial pressure of 3 mmHg.     Neuro/Psych negative neurological ROS  negative psych ROS   GI/Hepatic negative GI ROS, Neg liver ROS,,,  Endo/Other  diabetes    Renal/GU negative Renal ROS  negative genitourinary   Musculoskeletal negative musculoskeletal  ROS (+)    Abdominal Normal abdominal exam  (+)   Peds  Hematology Lab Results      Component                Value               Date                      WBC                      5.1                 09/21/2024                HGB                      14.1                09/21/2024                HCT                      43.1                09/21/2024  MCV                      93                  09/21/2024                PLT                      250                 09/21/2024             Lab Results      Component                Value               Date                      NA                       139                 09/21/2024                K                        4.0                 09/21/2024                CO2                      24                  09/21/2024                GLUCOSE                  117 (H)             09/21/2024                BUN                      23                  09/21/2024                CREATININE               1.10                09/21/2024                CALCIUM                   8.9                 09/21/2024                EGFR                     69                  09/21/2024                GFRNONAA                 >  60                 06/20/2024              Anesthesia Other Findings   Reproductive/Obstetrics                              Anesthesia Physical Anesthesia Plan  ASA: 3  Anesthesia Plan: General   Post-op Pain Management:    Induction: Intravenous  PONV Risk Score and Plan: 2 and Ondansetron , Dexamethasone  and Treatment may vary due to age or medical condition  Airway Management Planned: Mask and Oral ETT  Additional Equipment: ClearSight  Intra-op Plan:   Post-operative Plan: Extubation in OR  Informed Consent: I have reviewed the patients History and Physical, chart, labs and discussed the procedure including the risks, benefits and alternatives for the proposed anesthesia  with the patient or authorized representative who has indicated his/her understanding and acceptance.     Dental advisory given  Plan Discussed with: CRNA  Anesthesia Plan Comments:         Anesthesia Quick Evaluation  "

## 2024-10-01 NOTE — Anesthesia Procedure Notes (Signed)
 Procedure Name: Intubation Date/Time: 10/01/2024 7:58 AM  Performed by: Leopoldo Wanda DASEN, CRNAPre-anesthesia Checklist: Patient identified, Emergency Drugs available, Suction available and Patient being monitored Patient Re-evaluated:Patient Re-evaluated prior to induction Oxygen Delivery Method: Circle System Utilized Preoxygenation: Pre-oxygenation with 100% oxygen Induction Type: IV induction Ventilation: Mask ventilation without difficulty Laryngoscope Size: Mac and 4 Grade View: Grade II Tube type: Oral Tube size: 7.5 mm Number of attempts: 1 Airway Equipment and Method: Stylet Placement Confirmation: ETT inserted through vocal cords under direct vision, positive ETCO2 and breath sounds checked- equal and bilateral Secured at: 22 cm Tube secured with: Tape Dental Injury: Teeth and Oropharynx as per pre-operative assessment

## 2024-10-01 NOTE — Transfer of Care (Signed)
 Immediate Anesthesia Transfer of Care Note  Patient: Ruben [name]  Procedure(s) Performed: LEFT ATRIAL APPENDAGE OCCLUSION TRANSESOPHAGEAL ECHOCARDIOGRAM  Patient Location: PACU and Cath Lab  Anesthesia Type:General  Level of Consciousness: awake, alert , patient cooperative, and responds to stimulation  Airway & Oxygen Therapy: Patient Spontanous Breathing  Post-op Assessment: Report given to RN and Post -op Vital signs reviewed and stable  Post vital signs: Reviewed and stable  Last Vitals:  Vitals Value Taken Time  BP    Temp    Pulse    Resp    SpO2      Last Pain:  Vitals:   10/01/24 0645  TempSrc:   PainSc: 0-No pain         Complications: There were no known notable events for this encounter.

## 2024-10-01 NOTE — Anesthesia Postprocedure Evaluation (Signed)
"   Anesthesia Post Note  Patient: Ruben [name]  Procedure(s) Performed: LEFT ATRIAL APPENDAGE OCCLUSION TRANSESOPHAGEAL ECHOCARDIOGRAM     Patient location during evaluation: PACU Anesthesia Type: General Level of consciousness: awake and alert Pain management: pain level controlled Vital Signs Assessment: post-procedure vital signs reviewed and stable Respiratory status: spontaneous breathing, nonlabored ventilation, respiratory function stable and patient connected to nasal cannula oxygen Cardiovascular status: blood pressure returned to baseline and stable Postop Assessment: no apparent nausea or vomiting Anesthetic complications: no   There were no known notable events for this encounter.  Last Vitals:  Vitals:   10/01/24 1200 10/01/24 1247  BP: 118/65 (P) 113/61  Pulse: (!) 57 (P) 60  Resp: (!) 8 (P) 18  Temp:  (P) 36.6 C  SpO2: 97% (P) 97%    Last Pain:  Vitals:   10/01/24 1247  TempSrc: (P) Oral  PainSc:                  [name] [name]      "

## 2024-10-02 ENCOUNTER — Encounter (HOSPITAL_COMMUNITY): Payer: Self-pay | Admitting: Cardiology

## 2024-10-02 ENCOUNTER — Telehealth: Payer: Self-pay

## 2024-10-02 DIAGNOSIS — Z95818 Presence of other cardiac implants and grafts: Secondary | ICD-10-CM

## 2024-10-02 DIAGNOSIS — I4891 Unspecified atrial fibrillation: Secondary | ICD-10-CM

## 2024-10-02 MED FILL — Fentanyl Citrate Preservative Free (PF) Inj 100 MCG/2ML: INTRAMUSCULAR | Qty: 2 | Status: AC

## 2024-10-02 NOTE — Telephone Encounter (Signed)
" °  [facility]   Watchman Team  Contacted the patient regarding discharge from [facility] on 10/01/2024  The patient understands to follow up with Charlies Arthur, PA-C on 11/16/2024.  The patient understands discharge instructions? Yes  The patient understands medications and regimen? Yes   The patient reports groin sites are healing well with no pain, swelling or drainage.  Scheduled post procedure imaging on 12/02/2024.  Reiterated to patient to avoid dental visits prior to their upcoming follow-up appointment. Instructed them to call after that visit if they have a dental visit so antibiotics may be called in. Reiterated they will need dental SBE through 6 months post-procedure.  The patient understands to call with any questions or concerns prior to scheduled visit.   The patient was grateful for call and agreed with plan.   "

## 2024-10-29 ENCOUNTER — Telehealth: Payer: Self-pay

## 2024-10-29 NOTE — Telephone Encounter (Signed)
 Patient called in stating he has a cracked tooth. He is going in to see his dentist visit today but there will be no work done - they are just looking at it. Patient is < 45 days post PVI/LAAO. Advised patient ideally avoid dental work within 45 day period but if dentist deems necessary and or patient is in pain to let Cardiology office know so antibiotics can be sent in.   Patient grateful for call and verbalized understanding.

## 2024-10-29 NOTE — Telephone Encounter (Signed)
 Received call from [facility] with Caron Edison, DDS. Patient was seen by Dr. Clydene and will be going for fillings 11/22/24. She requested information be faxed over to her regarding his implant date and need for dental abx prior to visits.   Will send fax to [phone] as requested.

## 2024-11-02 ENCOUNTER — Ambulatory Visit (HOSPITAL_COMMUNITY): Admitting: Physician Assistant

## 2024-11-05 ENCOUNTER — Institutional Professional Consult (permissible substitution) (INDEPENDENT_AMBULATORY_CARE_PROVIDER_SITE_OTHER): Admitting: Otolaryngology

## 2024-11-16 ENCOUNTER — Ambulatory Visit: Admitting: Physician Assistant

## 2024-11-30 ENCOUNTER — Ambulatory Visit: Admitting: Pulmonary Disease

## 2024-12-02 ENCOUNTER — Other Ambulatory Visit (HOSPITAL_COMMUNITY)

## 2025-02-04 ENCOUNTER — Ambulatory Visit: Admitting: Neurology

## 2025-03-17 ENCOUNTER — Ambulatory Visit: Admitting: Cardiovascular Disease
